# Patient Record
Sex: Female | Born: 1955
Health system: Southern US, Community
[De-identification: ages and names within clinical notes are randomized; demographics above are authoritative.]

## PROBLEM LIST (undated history)

## (undated) DIAGNOSIS — I2510 Atherosclerotic heart disease of native coronary artery without angina pectoris: Secondary | ICD-10-CM

## (undated) DIAGNOSIS — K7689 Other specified diseases of liver: Secondary | ICD-10-CM

## (undated) DIAGNOSIS — H539 Unspecified visual disturbance: Secondary | ICD-10-CM

## (undated) DIAGNOSIS — E039 Hypothyroidism, unspecified: Secondary | ICD-10-CM

## (undated) DIAGNOSIS — T7840XA Allergy, unspecified, initial encounter: Secondary | ICD-10-CM

## (undated) DIAGNOSIS — Z803 Family history of malignant neoplasm of breast: Secondary | ICD-10-CM

## (undated) DIAGNOSIS — B019 Varicella without complication: Secondary | ICD-10-CM

## (undated) DIAGNOSIS — E669 Obesity, unspecified: Secondary | ICD-10-CM

## (undated) DIAGNOSIS — E785 Hyperlipidemia, unspecified: Secondary | ICD-10-CM

## (undated) DIAGNOSIS — I10 Essential (primary) hypertension: Secondary | ICD-10-CM

## (undated) DIAGNOSIS — I219 Acute myocardial infarction, unspecified: Secondary | ICD-10-CM

## (undated) DIAGNOSIS — G4762 Sleep related leg cramps: Secondary | ICD-10-CM

## (undated) DIAGNOSIS — B059 Measles without complication: Secondary | ICD-10-CM

## (undated) DIAGNOSIS — E041 Nontoxic single thyroid nodule: Secondary | ICD-10-CM

## (undated) DIAGNOSIS — R011 Cardiac murmur, unspecified: Secondary | ICD-10-CM

## (undated) DIAGNOSIS — E559 Vitamin D deficiency, unspecified: Secondary | ICD-10-CM

## (undated) DIAGNOSIS — G43909 Migraine, unspecified, not intractable, without status migrainosus: Secondary | ICD-10-CM

## (undated) DIAGNOSIS — J42 Unspecified chronic bronchitis: Secondary | ICD-10-CM

## (undated) DIAGNOSIS — I639 Cerebral infarction, unspecified: Secondary | ICD-10-CM

## (undated) DIAGNOSIS — C44300 Unspecified malignant neoplasm of skin of unspecified part of face: Secondary | ICD-10-CM

## (undated) DIAGNOSIS — R7303 Prediabetes: Secondary | ICD-10-CM

## (undated) DIAGNOSIS — J309 Allergic rhinitis, unspecified: Secondary | ICD-10-CM

## (undated) HISTORY — PX: TONSILLECTOMY: SUR1361

## (undated) HISTORY — DX: Cardiac murmur, unspecified: R01.1

## (undated) HISTORY — DX: Measles without complication: B05.9

## (undated) HISTORY — DX: Allergy, unspecified, initial encounter: T78.40XA

## (undated) HISTORY — DX: Other specified diseases of liver: K76.89

## (undated) HISTORY — DX: Nontoxic single thyroid nodule: E04.1

## (undated) HISTORY — DX: Vitamin D deficiency, unspecified: E55.9

## (undated) HISTORY — DX: Family history of malignant neoplasm of breast: Z80.3

## (undated) HISTORY — DX: Cerebral infarction, unspecified: I63.9

## (undated) HISTORY — DX: Essential (primary) hypertension: I10

## (undated) HISTORY — DX: Hyperlipidemia, unspecified: E78.5

## (undated) HISTORY — DX: Obesity, unspecified: E66.9

## (undated) HISTORY — PX: CYSTOSTOMY W/ BLADDER DILATION: SHX1432

## (undated) HISTORY — PX: LAPAROSCOPIC CHOLECYSTECTOMY: SUR755

## (undated) HISTORY — DX: Unspecified malignant neoplasm of skin of unspecified part of face: C44.300

## (undated) HISTORY — PX: SQUAMOUS CELL CARCINOMA EXCISION: SHX2433

## (undated) HISTORY — DX: Varicella without complication: B01.9

## (undated) HISTORY — DX: Allergic rhinitis, unspecified: J30.9

## (undated) HISTORY — DX: Unspecified visual disturbance: H53.9

## (undated) HISTORY — DX: Sleep related leg cramps: G47.62

---

## 1996-10-04 DIAGNOSIS — C4491 Basal cell carcinoma of skin, unspecified: Secondary | ICD-10-CM

## 1996-10-04 HISTORY — DX: Basal cell carcinoma of skin, unspecified: C44.91

## 1997-10-28 ENCOUNTER — Encounter: Admission: RE | Admit: 1997-10-28 | Discharge: 1997-10-28 | Payer: Self-pay | Admitting: Family Medicine

## 1997-11-30 ENCOUNTER — Encounter: Admission: RE | Admit: 1997-11-30 | Discharge: 1997-11-30 | Payer: Self-pay | Admitting: Family Medicine

## 1998-11-17 ENCOUNTER — Other Ambulatory Visit: Admission: RE | Admit: 1998-11-17 | Discharge: 1998-11-17 | Payer: Self-pay | Admitting: Family Medicine

## 1998-11-17 ENCOUNTER — Encounter: Admission: RE | Admit: 1998-11-17 | Discharge: 1998-11-17 | Payer: Self-pay | Admitting: Family Medicine

## 1999-01-09 ENCOUNTER — Encounter: Admission: RE | Admit: 1999-01-09 | Discharge: 1999-01-09 | Payer: Self-pay | Admitting: Family Medicine

## 1999-01-23 ENCOUNTER — Encounter: Admission: RE | Admit: 1999-01-23 | Discharge: 1999-01-23 | Payer: Self-pay | Admitting: Family Medicine

## 1999-07-23 ENCOUNTER — Encounter: Admission: RE | Admit: 1999-07-23 | Discharge: 1999-07-23 | Payer: Self-pay | Admitting: Family Medicine

## 1999-11-02 ENCOUNTER — Encounter: Admission: RE | Admit: 1999-11-02 | Discharge: 1999-11-02 | Payer: Self-pay | Admitting: Family Medicine

## 1999-12-17 ENCOUNTER — Encounter: Admission: RE | Admit: 1999-12-17 | Discharge: 1999-12-17 | Payer: Self-pay | Admitting: Family Medicine

## 2000-10-31 ENCOUNTER — Encounter: Admission: RE | Admit: 2000-10-31 | Discharge: 2000-10-31 | Payer: Self-pay | Admitting: Family Medicine

## 2000-12-05 DIAGNOSIS — C4491 Basal cell carcinoma of skin, unspecified: Secondary | ICD-10-CM

## 2000-12-05 HISTORY — DX: Basal cell carcinoma of skin, unspecified: C44.91

## 2001-12-04 ENCOUNTER — Encounter: Admission: RE | Admit: 2001-12-04 | Discharge: 2001-12-04 | Payer: Self-pay | Admitting: Family Medicine

## 2001-12-16 ENCOUNTER — Encounter: Payer: Self-pay | Admitting: Sports Medicine

## 2001-12-16 ENCOUNTER — Encounter: Admission: RE | Admit: 2001-12-16 | Discharge: 2001-12-16 | Payer: Self-pay | Admitting: Sports Medicine

## 2002-02-10 ENCOUNTER — Ambulatory Visit (HOSPITAL_COMMUNITY): Admission: RE | Admit: 2002-02-10 | Discharge: 2002-02-10 | Payer: Self-pay | Admitting: Family Medicine

## 2002-02-10 ENCOUNTER — Encounter: Payer: Self-pay | Admitting: Family Medicine

## 2002-02-10 ENCOUNTER — Encounter: Admission: RE | Admit: 2002-02-10 | Discharge: 2002-02-10 | Payer: Self-pay | Admitting: Family Medicine

## 2002-02-25 ENCOUNTER — Encounter: Admission: RE | Admit: 2002-02-25 | Discharge: 2002-02-25 | Payer: Self-pay | Admitting: Family Medicine

## 2002-03-22 ENCOUNTER — Encounter: Admission: RE | Admit: 2002-03-22 | Discharge: 2002-03-22 | Payer: Self-pay | Admitting: Sports Medicine

## 2002-05-12 ENCOUNTER — Encounter: Admission: RE | Admit: 2002-05-12 | Discharge: 2002-05-12 | Payer: Self-pay | Admitting: Family Medicine

## 2002-12-22 ENCOUNTER — Encounter: Admission: RE | Admit: 2002-12-22 | Discharge: 2002-12-22 | Payer: Self-pay | Admitting: Family Medicine

## 2003-01-12 ENCOUNTER — Encounter: Admission: RE | Admit: 2003-01-12 | Discharge: 2003-01-12 | Payer: Self-pay | Admitting: Family Medicine

## 2003-01-14 ENCOUNTER — Encounter: Admission: RE | Admit: 2003-01-14 | Discharge: 2003-01-14 | Payer: Self-pay | Admitting: Family Medicine

## 2003-01-26 ENCOUNTER — Encounter: Payer: Self-pay | Admitting: Family Medicine

## 2003-01-26 ENCOUNTER — Encounter: Admission: RE | Admit: 2003-01-26 | Discharge: 2003-01-26 | Payer: Self-pay | Admitting: Family Medicine

## 2003-04-02 ENCOUNTER — Emergency Department (HOSPITAL_COMMUNITY): Admission: AD | Admit: 2003-04-02 | Discharge: 2003-04-02 | Payer: Self-pay | Admitting: Family Medicine

## 2003-04-11 ENCOUNTER — Emergency Department (HOSPITAL_COMMUNITY): Admission: AD | Admit: 2003-04-11 | Discharge: 2003-04-11 | Payer: Self-pay | Admitting: Family Medicine

## 2003-08-22 ENCOUNTER — Emergency Department (HOSPITAL_COMMUNITY): Admission: AD | Admit: 2003-08-22 | Discharge: 2003-08-22 | Payer: Self-pay | Admitting: Family Medicine

## 2003-11-02 ENCOUNTER — Emergency Department (HOSPITAL_COMMUNITY): Admission: EM | Admit: 2003-11-02 | Discharge: 2003-11-02 | Payer: Self-pay | Admitting: Family Medicine

## 2003-11-20 ENCOUNTER — Emergency Department (HOSPITAL_COMMUNITY): Admission: EM | Admit: 2003-11-20 | Discharge: 2003-11-20 | Payer: Self-pay | Admitting: Internal Medicine

## 2003-11-23 ENCOUNTER — Encounter (INDEPENDENT_AMBULATORY_CARE_PROVIDER_SITE_OTHER): Payer: Self-pay | Admitting: *Deleted

## 2003-11-23 LAB — CONVERTED CEMR LAB

## 2003-12-12 ENCOUNTER — Encounter: Admission: RE | Admit: 2003-12-12 | Discharge: 2003-12-12 | Payer: Self-pay | Admitting: Family Medicine

## 2004-05-24 ENCOUNTER — Emergency Department (HOSPITAL_COMMUNITY): Admission: EM | Admit: 2004-05-24 | Discharge: 2004-05-24 | Payer: Self-pay | Admitting: Family Medicine

## 2004-10-12 DIAGNOSIS — C4490 Unspecified malignant neoplasm of skin, unspecified: Secondary | ICD-10-CM

## 2004-10-12 HISTORY — DX: Unspecified malignant neoplasm of skin, unspecified: C44.90

## 2005-05-31 ENCOUNTER — Ambulatory Visit (HOSPITAL_COMMUNITY): Admission: RE | Admit: 2005-05-31 | Discharge: 2005-05-31 | Payer: Self-pay | Admitting: Family Medicine

## 2006-06-02 ENCOUNTER — Ambulatory Visit (HOSPITAL_COMMUNITY): Admission: RE | Admit: 2006-06-02 | Discharge: 2006-06-02 | Payer: Self-pay | Admitting: Family Medicine

## 2006-08-22 ENCOUNTER — Encounter (INDEPENDENT_AMBULATORY_CARE_PROVIDER_SITE_OTHER): Payer: Self-pay | Admitting: *Deleted

## 2007-02-16 DIAGNOSIS — C44300 Unspecified malignant neoplasm of skin of unspecified part of face: Secondary | ICD-10-CM

## 2007-02-16 HISTORY — DX: Unspecified malignant neoplasm of skin of unspecified part of face: C44.300

## 2007-06-25 HISTORY — PX: COLONOSCOPY: SHX174

## 2007-07-13 ENCOUNTER — Ambulatory Visit (HOSPITAL_COMMUNITY): Admission: RE | Admit: 2007-07-13 | Discharge: 2007-07-13 | Payer: Self-pay | Admitting: Family Medicine

## 2007-07-22 ENCOUNTER — Encounter: Admission: RE | Admit: 2007-07-22 | Discharge: 2007-07-22 | Payer: Self-pay | Admitting: Family Medicine

## 2008-04-11 ENCOUNTER — Ambulatory Visit: Payer: Self-pay | Admitting: Family Medicine

## 2008-08-03 ENCOUNTER — Encounter: Admission: RE | Admit: 2008-08-03 | Discharge: 2008-08-03 | Payer: Self-pay | Admitting: Family Medicine

## 2009-04-03 ENCOUNTER — Encounter: Admission: RE | Admit: 2009-04-03 | Discharge: 2009-04-03 | Payer: Self-pay | Admitting: Gastroenterology

## 2009-08-09 ENCOUNTER — Encounter: Admission: RE | Admit: 2009-08-09 | Discharge: 2009-08-09 | Payer: Self-pay | Admitting: Family Medicine

## 2010-04-11 ENCOUNTER — Encounter
Admission: RE | Admit: 2010-04-11 | Discharge: 2010-07-10 | Payer: Self-pay | Source: Home / Self Care | Attending: General Surgery | Admitting: General Surgery

## 2010-04-23 ENCOUNTER — Ambulatory Visit (HOSPITAL_COMMUNITY): Admission: RE | Admit: 2010-04-23 | Discharge: 2010-04-23 | Payer: Self-pay | Admitting: General Surgery

## 2010-05-14 ENCOUNTER — Ambulatory Visit: Payer: Self-pay | Admitting: Family Medicine

## 2010-07-03 ENCOUNTER — Ambulatory Visit (HOSPITAL_COMMUNITY)
Admission: RE | Admit: 2010-07-03 | Discharge: 2010-07-03 | Payer: Self-pay | Source: Home / Self Care | Attending: General Surgery | Admitting: General Surgery

## 2010-07-03 HISTORY — PX: LAPAROSCOPIC GASTRIC BANDING: SHX1100

## 2010-07-09 LAB — GLUCOSE, CAPILLARY
Glucose-Capillary: 153 mg/dL — ABNORMAL HIGH (ref 70–99)
Glucose-Capillary: 204 mg/dL — ABNORMAL HIGH (ref 70–99)

## 2010-07-14 ENCOUNTER — Other Ambulatory Visit: Payer: Self-pay | Admitting: Family Medicine

## 2010-07-14 DIAGNOSIS — Z1239 Encounter for other screening for malignant neoplasm of breast: Secondary | ICD-10-CM

## 2010-07-15 ENCOUNTER — Encounter: Payer: Self-pay | Admitting: Family Medicine

## 2010-07-17 ENCOUNTER — Encounter
Admission: RE | Admit: 2010-07-17 | Discharge: 2010-07-24 | Payer: Self-pay | Source: Home / Self Care | Attending: General Surgery | Admitting: General Surgery

## 2010-08-14 ENCOUNTER — Ambulatory Visit
Admission: RE | Admit: 2010-08-14 | Discharge: 2010-08-14 | Disposition: A | Discharge status: Home / Self Care | Payer: BC Managed Care – PPO | Source: Ambulatory Visit | Attending: Family Medicine | Admitting: Family Medicine

## 2010-08-14 DIAGNOSIS — Z1239 Encounter for other screening for malignant neoplasm of breast: Secondary | ICD-10-CM

## 2010-08-28 ENCOUNTER — Encounter: Payer: BC Managed Care – PPO | Attending: General Surgery | Admitting: *Deleted

## 2010-08-28 DIAGNOSIS — Z9884 Bariatric surgery status: Secondary | ICD-10-CM | POA: Insufficient documentation

## 2010-08-28 DIAGNOSIS — Z713 Dietary counseling and surveillance: Secondary | ICD-10-CM | POA: Insufficient documentation

## 2010-08-28 DIAGNOSIS — Z09 Encounter for follow-up examination after completed treatment for conditions other than malignant neoplasm: Secondary | ICD-10-CM | POA: Insufficient documentation

## 2010-09-03 LAB — COMPREHENSIVE METABOLIC PANEL
ALT: 86 U/L — ABNORMAL HIGH (ref 0–35)
AST: 48 U/L — ABNORMAL HIGH (ref 0–37)
Albumin: 4.3 g/dL (ref 3.5–5.2)
Alkaline Phosphatase: 65 U/L (ref 39–117)
BUN: 17 mg/dL (ref 6–23)
CO2: 25 mEq/L (ref 19–32)
Calcium: 10.3 mg/dL (ref 8.4–10.5)
Chloride: 102 mEq/L (ref 96–112)
Creatinine, Ser: 0.74 mg/dL (ref 0.4–1.2)
GFR calc Af Amer: >60 mL/min (ref >60)
GFR calc non Af Amer: >60 mL/min (ref >60)
Glucose, Bld: 122 mg/dL — ABNORMAL HIGH (ref 70–99)
Potassium: 4.2 mEq/L (ref 3.5–5.1)
Sodium: 138 mEq/L (ref 135–145)
Total Bilirubin: 0.5 mg/dL (ref 0.3–1.2)
Total Protein: 7.9 g/dL (ref 6.0–8.3)

## 2010-09-03 LAB — SURGICAL PCR SCREEN
MRSA, PCR: NEGATIVE
Staphylococcus aureus: POSITIVE — AB

## 2010-09-03 LAB — CBC
HCT: 37.5 % (ref 36.0–46.0)
Hemoglobin: 12.3 g/dL (ref 12.0–15.0)
MCH: 31.9 pg (ref 26.0–34.0)
MCHC: 32.8 g/dL (ref 30.0–36.0)
MCV: 97.4 fL (ref 78.0–100.0)
Platelets: 301 10*3/uL (ref 150–400)
RBC: 3.85 MIL/uL — ABNORMAL LOW (ref 3.87–5.11)
RDW: 12.6 % (ref 11.5–15.5)
WBC: 8.8 10*3/uL (ref 4.0–10.5)

## 2010-09-03 LAB — DIFFERENTIAL
Basophils Absolute: 0.1 10*3/uL (ref 0.0–0.1)
Basophils Relative: 1 % (ref 0–1)
Eosinophils Absolute: 0.1 10*3/uL (ref 0.0–0.7)
Eosinophils Relative: 1 % (ref 0–5)
Lymphocytes Relative: 28 % (ref 12–46)
Lymphs Abs: 2.5 10*3/uL (ref 0.7–4.0)
Monocytes Absolute: 0.5 10*3/uL (ref 0.1–1.0)
Monocytes Relative: 6 % (ref 3–12)
Neutro Abs: 5.8 10*3/uL (ref 1.7–7.7)
Neutrophils Relative %: 65 % (ref 43–77)

## 2010-10-19 ENCOUNTER — Encounter (INDEPENDENT_AMBULATORY_CARE_PROVIDER_SITE_OTHER): Payer: Self-pay | Admitting: General Surgery

## 2010-10-30 ENCOUNTER — Ambulatory Visit: Payer: BC Managed Care – PPO | Admitting: *Deleted

## 2010-11-07 ENCOUNTER — Encounter: Payer: BC Managed Care – PPO | Attending: General Surgery | Admitting: *Deleted

## 2010-11-07 DIAGNOSIS — Z9884 Bariatric surgery status: Secondary | ICD-10-CM | POA: Insufficient documentation

## 2010-11-07 DIAGNOSIS — Z713 Dietary counseling and surveillance: Secondary | ICD-10-CM | POA: Insufficient documentation

## 2010-11-07 DIAGNOSIS — Z09 Encounter for follow-up examination after completed treatment for conditions other than malignant neoplasm: Secondary | ICD-10-CM | POA: Insufficient documentation

## 2010-12-27 ENCOUNTER — Encounter (INDEPENDENT_AMBULATORY_CARE_PROVIDER_SITE_OTHER): Payer: Self-pay

## 2010-12-28 ENCOUNTER — Encounter (INDEPENDENT_AMBULATORY_CARE_PROVIDER_SITE_OTHER): Payer: BC Managed Care – PPO

## 2011-01-08 ENCOUNTER — Ambulatory Visit: Payer: BC Managed Care – PPO | Admitting: *Deleted

## 2011-01-25 ENCOUNTER — Encounter (INDEPENDENT_AMBULATORY_CARE_PROVIDER_SITE_OTHER): Payer: Self-pay

## 2011-01-25 ENCOUNTER — Ambulatory Visit (INDEPENDENT_AMBULATORY_CARE_PROVIDER_SITE_OTHER): Payer: BC Managed Care – PPO | Admitting: Physician Assistant

## 2011-01-25 VITALS — BP 114/72 | Ht 62.5 in | Wt 213.8 lb

## 2011-01-25 DIAGNOSIS — Z4651 Encounter for fitting and adjustment of gastric lap band: Secondary | ICD-10-CM

## 2011-01-25 NOTE — Progress Notes (Signed)
  HISTORY: Elaine Jackson is a 55 y.o.female who received an AP-Standard lap-band in January 2012 by Dr. Johna Sheriff. She has no new complaints. She denies persistent regurgitation symptoms but believes that her portion sizes are bigger than they should be.  VITAL SIGNS: Filed Vitals:   01/25/11 1405  BP: 114/72    PHYSICAL EXAM: Physical exam reveals a very well-appearing 55 y.o.female in no apparent distress Neurologic: Awake, alert, oriented Psych: Bright affect, conversant Respiratory: Breathing even and unlabored. No stridor or wheezing Abdomen: Soft, nontender, nondistended to palpation. Incisions well-healed. No incisional hernias. Port easily palpated. Extremities: Atraumatic, good range of motion.  ASSESMENT: 55 y.o.  female  s/p AP-Standard lap-band.   PLAN: The patient's port was accessed with a 20G Huber needle without difficulty. Clear fluid was aspirated and 0.5 mL saline was added to the port to give a total fill volume of 4.0 mL. The patient was able to swallow water without difficulty following the procedure and was instructed to take clear liquids for the next 24-48 hours and advance slowly as tolerated.

## 2011-01-25 NOTE — Patient Instructions (Signed)
Take clear liquids for the next 48 hours. Thin protein shakes are ok to start on Saturday evening. Call us if you have persistent vomiting or regurgitation, night cough or reflux symptoms. Return as scheduled or sooner if you notice no changes in hunger/portion sizes.   

## 2011-01-29 ENCOUNTER — Ambulatory Visit: Payer: BC Managed Care – PPO | Admitting: *Deleted

## 2011-02-13 ENCOUNTER — Encounter: Payer: Self-pay | Admitting: *Deleted

## 2011-02-13 ENCOUNTER — Encounter: Payer: BC Managed Care – PPO | Admitting: *Deleted

## 2011-02-13 DIAGNOSIS — Z9884 Bariatric surgery status: Secondary | ICD-10-CM | POA: Insufficient documentation

## 2011-02-13 NOTE — Progress Notes (Deleted)
  Follow-up visit: *** Weeks Post-Operative LAGB Surgery  Medical Nutrition Therapy:  Appt start time: 0800 end time:  0830.  Assessment:  Primary concerns today: post-operative bariatric surgery nutrition management.  Weight today: *** Weight change: *** Total weight lost: *** BMI: *** Weight goal: *** % Weight goal met: ***  24-hr recall:  B (*** AM): *** Snk (***  AM): ***   L (*** PM): *** Snk (*** PM): ***  D (*** PM): *** Snk (*** PM): ***  Fluid intake: *** Estimated total protein intake: ***  Medications: *** Supplementation: ***  CBG monitoring: *** Average CBG per patient: *** Last patient reported A1c: ***  Using straws: *** Drinking while eating:*** Hair loss: *** Carbonated beverages: *** N/V/D/C: *** Dumping syndrome: *** Last Lap-Band fill: ***  Recent physical activity:  ***  Progress Towards Goal(s):  In progress.   Nutritional Diagnosis:  [place]-3.3 Overweight/obesity As related to recent LAGB surgery.  As evidenced by pt with BMI >30, continuing to follow post-op dietary guidelines for weight loss.    Intervention:    Follow Phase 3B: High Protein + Non-Starchy Vegetables  Eat 3-6 small meals/snacks, every 3-5 hrs  Increase lean protein foods to meet 60-80g goal  Increase fluid intake to 64oz +  Add 15 grams of carbohydrate (fruit, whole grain, starchy vegetable) with meals  Avoid drinking 15 minutes before, during and 30 minutes after eating  Aim for >30 min of physical activity daily  Monitoring/Evaluation:  Dietary intake, exercise, lap band fills, and body weight. Follow up in *** months for *** month post-op visit.

## 2011-03-01 ENCOUNTER — Encounter (INDEPENDENT_AMBULATORY_CARE_PROVIDER_SITE_OTHER): Payer: BC Managed Care – PPO

## 2011-03-28 ENCOUNTER — Telehealth (INDEPENDENT_AMBULATORY_CARE_PROVIDER_SITE_OTHER): Payer: Self-pay

## 2011-03-28 NOTE — Telephone Encounter (Signed)
Called patient to reschedule appointment for Friday 03/28/11 since Mardelle Matte has cancelled his afternoon office. She will need to change her time to either the morning or reschedule for another day.

## 2011-03-29 ENCOUNTER — Ambulatory Visit (INDEPENDENT_AMBULATORY_CARE_PROVIDER_SITE_OTHER): Payer: BC Managed Care – PPO | Admitting: Physician Assistant

## 2011-03-29 ENCOUNTER — Encounter (INDEPENDENT_AMBULATORY_CARE_PROVIDER_SITE_OTHER): Payer: Self-pay | Admitting: Physician Assistant

## 2011-03-29 VITALS — BP 116/74 | HR 68 | Resp 14 | Ht 62.5 in | Wt 215.0 lb

## 2011-03-29 DIAGNOSIS — Z4651 Encounter for fitting and adjustment of gastric lap band: Secondary | ICD-10-CM

## 2011-03-29 NOTE — Progress Notes (Signed)
  HISTORY: Elaine Jackson is a 55 y.o.female who received an AP-Standard lap-band in January 2012 by Dr. Johna Sheriff. She comes in with no new complaints. She denies vomiting or regurgitation symptoms but says that she's tempted to have 'seconds' after meals and has eaten them without difficulty. She says her hunger is unpredictable. She believes her biggest problems right now are choice related including lack of exercise and food choices. She thinks an adjustment would help regardless, as she's gained weight over the past two visits.  VITAL SIGNS: Filed Vitals:   03/29/11 0832  BP: 116/74  Pulse: 68  Resp: 14    PHYSICAL EXAM: Physical exam reveals a very well-appearing 55 y.o.female in no apparent distress Neurologic: Awake, alert, oriented Psych: Bright affect, conversant Respiratory: Breathing even and unlabored. No stridor or wheezing Abdomen: Soft, nontender, nondistended to palpation. Incisions well-healed. No incisional hernias. Port easily palpated. Extremities: Atraumatic, good range of motion.  ASSESMENT: 55 y.o.  female  s/p AP-Standard lap-band.   PLAN: The patient's port was accessed with a 20G Huber needle without difficulty. Clear fluid was aspirated and 0.5 mL saline was added to the port to give a total predicted volume of 4.5 mL. The patient was able to swallow water without difficulty following the procedure and was instructed to take clear liquids for the next 24-48 hours and advance slowly as tolerated.

## 2011-03-29 NOTE — Patient Instructions (Signed)
Take clear liquids for the next 48 hours. Thin protein shakes are ok to start on Saturday evening. Call us if you have persistent vomiting or regurgitation, night cough or reflux symptoms. Return as scheduled or sooner if you notice no changes in hunger/portion sizes.   

## 2011-05-10 ENCOUNTER — Encounter (INDEPENDENT_AMBULATORY_CARE_PROVIDER_SITE_OTHER): Payer: BC Managed Care – PPO

## 2011-05-24 ENCOUNTER — Encounter (INDEPENDENT_AMBULATORY_CARE_PROVIDER_SITE_OTHER): Payer: BC Managed Care – PPO

## 2011-06-07 ENCOUNTER — Encounter (INDEPENDENT_AMBULATORY_CARE_PROVIDER_SITE_OTHER): Payer: BC Managed Care – PPO

## 2011-07-04 ENCOUNTER — Encounter (INDEPENDENT_AMBULATORY_CARE_PROVIDER_SITE_OTHER): Payer: Self-pay | Admitting: General Surgery

## 2011-07-05 ENCOUNTER — Ambulatory Visit (INDEPENDENT_AMBULATORY_CARE_PROVIDER_SITE_OTHER): Payer: BC Managed Care – PPO | Admitting: Physician Assistant

## 2011-07-05 ENCOUNTER — Encounter (INDEPENDENT_AMBULATORY_CARE_PROVIDER_SITE_OTHER): Payer: Self-pay

## 2011-07-05 DIAGNOSIS — Z9884 Bariatric surgery status: Secondary | ICD-10-CM

## 2011-07-05 NOTE — Progress Notes (Signed)
  HISTORY: Elaine Jackson is a 56 y.o.female who received an AP-Standard lap-band in January 2012 by Dr. Johna Sheriff. She comes in with no new complaints. She has managed to lose weight despite the recent holidays. She will resume exercise class next week.  VITAL SIGNS: Filed Vitals:   07/05/11 1346  BP: 124/70  Pulse: 68  Temp: 97.6 F (36.4 C)  Resp: 16    PHYSICAL EXAM: Physical exam reveals a very well-appearing 56 y.o.female in no apparent distress Neurologic: Awake, alert, oriented Psych: Bright affect, conversant Respiratory: Breathing even and unlabored. No stridor or wheezing Extremities: Atraumatic, good range of motion. Skin: Warm, Dry, no rashes Musculoskeletal: Normal gait, Joints normal  ASSESMENT: 56 y.o.  female  s/p AP-Standard lap-band.   PLAN: She appears to be in the green zone, so I did not offer an adjustment today. I encouraged her to continue to watch her food choices and portions as well as to work on exercise. We'll have her back in three months or sooner if needed. As this is her one year anniversary since surgery, we obtained a photo.

## 2011-07-05 NOTE — Patient Instructions (Signed)
Return in three months. Return sooner if you notice increased hunger or appetite, ability to eat more food at a sitting or going for seconds, or if you have difficulty swallowing.

## 2011-08-16 ENCOUNTER — Encounter (INDEPENDENT_AMBULATORY_CARE_PROVIDER_SITE_OTHER): Payer: BC Managed Care – PPO

## 2011-08-19 ENCOUNTER — Other Ambulatory Visit: Payer: Self-pay | Admitting: Family Medicine

## 2011-08-19 DIAGNOSIS — Z1231 Encounter for screening mammogram for malignant neoplasm of breast: Secondary | ICD-10-CM

## 2011-09-04 ENCOUNTER — Ambulatory Visit: Payer: BC Managed Care – PPO

## 2011-09-06 ENCOUNTER — Ambulatory Visit
Admission: RE | Admit: 2011-09-06 | Discharge: 2011-09-06 | Disposition: A | Discharge status: Home / Self Care | Payer: BC Managed Care – PPO | Source: Ambulatory Visit | Attending: Family Medicine | Admitting: Family Medicine

## 2011-09-06 DIAGNOSIS — Z1231 Encounter for screening mammogram for malignant neoplasm of breast: Secondary | ICD-10-CM

## 2011-09-19 ENCOUNTER — Encounter (INDEPENDENT_AMBULATORY_CARE_PROVIDER_SITE_OTHER): Payer: BC Managed Care – PPO

## 2011-10-10 ENCOUNTER — Encounter (INDEPENDENT_AMBULATORY_CARE_PROVIDER_SITE_OTHER): Payer: BC Managed Care – PPO

## 2011-11-07 ENCOUNTER — Encounter (INDEPENDENT_AMBULATORY_CARE_PROVIDER_SITE_OTHER): Payer: Self-pay

## 2011-11-07 ENCOUNTER — Ambulatory Visit (INDEPENDENT_AMBULATORY_CARE_PROVIDER_SITE_OTHER): Payer: BC Managed Care – PPO | Admitting: Physician Assistant

## 2011-11-07 VITALS — BP 136/88 | HR 64 | Temp 97.4°F | Resp 16 | Ht 62.5 in | Wt 209.6 lb

## 2011-11-07 DIAGNOSIS — Z4651 Encounter for fitting and adjustment of gastric lap band: Secondary | ICD-10-CM

## 2011-11-07 NOTE — Progress Notes (Signed)
  HISTORY: Elaine Jackson is a 56 y.o.female who received an AP-Standard  lap-band in January 2012. She comes in today with no new complaints. She would like to increase her weight loss. We discussed physical activity as one component. She thought a small adjustment would help her along.  VITAL SIGNS: Filed Vitals:   11/07/11 0915  BP: 136/88  Pulse: 64  Temp: 97.4 F (36.3 C)  Resp: 16    PHYSICAL EXAM: Physical exam reveals a very well-appearing 56 y.o.female in no apparent distress Neurologic: Awake, alert, oriented Psych: Bright affect, conversant Respiratory: Breathing even and unlabored. No stridor or wheezing Abdomen: Soft, nontender, nondistended to palpation. Incisions well-healed. No incisional hernias. Port easily palpated. Extremities: Atraumatic, good range of motion.  ASSESMENT: 57 y.o.  female  s/p AP-Standard lap-band.   PLAN: The patient's port was accessed with a 20G Huber needle without difficulty. Clear fluid was aspirated and 0.25 mL saline was added to the port. The patient was able to swallow water without difficulty following the procedure and was instructed to take clear liquids for the next 24-48 hours and advance slowly as tolerated.

## 2011-11-07 NOTE — Patient Instructions (Signed)
Take clear liquids tonight. Thin protein shakes are ok to start tomorrow morning. Slowly advance your diet thereafter. Call us if you have persistent vomiting or regurgitation, night cough or reflux symptoms. Return as scheduled or sooner if you notice no changes in hunger/portion sizes.  

## 2011-12-02 LAB — LIPID PANEL
Cholesterol: 191 mg/dL (ref 0–200)
HDL: 44 mg/dL (ref 35–70)
LDL Cholesterol: 103 mg/dL

## 2012-02-27 ENCOUNTER — Encounter: Payer: Self-pay | Admitting: *Deleted

## 2012-02-27 ENCOUNTER — Encounter: Payer: Self-pay | Admitting: Family Medicine

## 2012-03-02 ENCOUNTER — Encounter: Payer: Self-pay | Admitting: Family Medicine

## 2012-03-02 ENCOUNTER — Ambulatory Visit (INDEPENDENT_AMBULATORY_CARE_PROVIDER_SITE_OTHER): Payer: BC Managed Care – PPO | Admitting: Family Medicine

## 2012-03-02 VITALS — BP 116/70 | HR 61 | Temp 98.1°F | Resp 16 | Ht 62.0 in | Wt 208.6 lb

## 2012-03-02 DIAGNOSIS — Z23 Encounter for immunization: Secondary | ICD-10-CM

## 2012-03-02 DIAGNOSIS — E669 Obesity, unspecified: Secondary | ICD-10-CM

## 2012-03-02 DIAGNOSIS — I10 Essential (primary) hypertension: Secondary | ICD-10-CM

## 2012-03-02 DIAGNOSIS — E785 Hyperlipidemia, unspecified: Secondary | ICD-10-CM

## 2012-03-02 DIAGNOSIS — E119 Type 2 diabetes mellitus without complications: Secondary | ICD-10-CM

## 2012-03-02 DIAGNOSIS — Z9884 Bariatric surgery status: Secondary | ICD-10-CM

## 2012-03-02 DIAGNOSIS — E7800 Pure hypercholesterolemia, unspecified: Secondary | ICD-10-CM

## 2012-03-02 LAB — CBC WITH DIFFERENTIAL/PLATELET
Basophils Absolute: 0 10*3/uL (ref 0.0–0.1)
Eosinophils Absolute: 0.1 10*3/uL (ref 0.0–0.7)
Eosinophils Relative: 1 % (ref 0–5)
Lymphocytes Relative: 30 % (ref 12–46)
Lymphs Abs: 2.1 10*3/uL (ref 0.7–4.0)
MCH: 30.5 pg (ref 26.0–34.0)
Neutrophils Relative %: 64 % (ref 43–77)
Platelets: 315 10*3/uL (ref 150–400)
RBC: 3.87 MIL/uL (ref 3.87–5.11)
RDW: 13.2 % (ref 11.5–15.5)
WBC: 7.1 10*3/uL (ref 4.0–10.5)

## 2012-03-02 LAB — COMPREHENSIVE METABOLIC PANEL
ALT: 14 U/L (ref 0–35)
AST: 13 U/L (ref 0–37)
Alkaline Phosphatase: 54 U/L (ref 39–117)
BUN: 14 mg/dL (ref 6–23)
Calcium: 9.9 mg/dL (ref 8.4–10.5)
Chloride: 105 mEq/L (ref 96–112)
Creat: 0.72 mg/dL (ref 0.50–1.10)

## 2012-03-02 LAB — LIPID PANEL
Cholesterol: 179 mg/dL (ref 0–200)
HDL: 39 mg/dL — ABNORMAL LOW (ref >39)
LDL Cholesterol: 100 mg/dL — ABNORMAL HIGH (ref 0–99)
Total CHOL/HDL Ratio: 4.6 Ratio
Triglycerides: 199 mg/dL — ABNORMAL HIGH (ref <150)
VLDL: 40 mg/dL (ref 0–40)

## 2012-03-02 LAB — CK: Total CK: 112 U/L (ref 7–177)

## 2012-03-02 NOTE — Progress Notes (Signed)
[address]   [place], [place]  [address]   [phone]  Subjective:    Patient ID: Elaine Jackson, female    DOB: September 04, 1955, 56 y.o.   MRN: [id number]  HPIThis 56 y.o. female presents to establish care and for three month follow-up:  1.  DMII:  Three month follow-up; no changes to management made at last visit.  Sugars running 111.   Reports compliance with medication; good tolerance to medication; good symptom control.  2.  HTN:  Three month follow-up; no changes to management made at last visit.  Reports good tolerance to medication; good compliance with medication; good symptom control.  Does not check blood pressure at home.  Denies chest pain, palpitations, shortness of breath, or leg swelling.   3. Hyperlipidemia:  Three month follow-up; no changes to management made at last visit.  Reports good compliance to treatment; good tolerance to treatment; good symptom control.  Denies HA, dizziness, vision changes, focal weakness, paresthesias.  4.  Obesity:  Weighs every morning.  Last fill months ago.  Scheduled to return in 01/2012 but did not call.  Can only eat 1/2 sandwich at a time.  No GI upset or discomfort.  Starting weight 259.    5.  Flu vaccine.  Agreeable.      Review of Systems  Constitutional: Negative for chills, diaphoresis and appetite change.  Respiratory: Negative for cough, shortness of breath, wheezing and stridor.   Cardiovascular: Negative for chest pain, palpitations and leg swelling.  Gastrointestinal: Negative for nausea, vomiting, abdominal pain, diarrhea and constipation.  Skin: Negative for color change, pallor, rash and wound.  Neurological: Negative for dizziness, tremors, seizures, syncope, facial asymmetry, speech difficulty, weakness, light-headedness, numbness and headaches.    Past Medical History  Diagnosis Date  . Hypertension   . Diabetes mellitus   . Hyperlipidemia   . Mass of leg   . Family history of breast cancer in first degree  relative     sister age 27  . Sleep related leg cramps   . Malignant neoplasm skin of face 02/16/2007  . Allergic rhinitis, cause unspecified   . Obesity, unspecified   . Unspecified vitamin D deficiency   . Other chronic nonalcoholic liver disease   . Chicken pox   . Measles     Past Surgical History  Procedure Date  . Gallbladder surgery 1994  . Laparoscopic gastric banding 07/03/10    Dr. Adriana Mccallum; [facility]  . Cholecystectomy   . Tonsillectomy     Prior to Admission medications   Medication Sig Start Date End Date Taking? Authorizing Provider  Calcium Carbonate-Vit D-Min (CALCIUM 1200 PO) Take 1,200 mg by mouth daily.     Yes Historical Provider, MD  glucose blood test strip 1 each by Other route as needed. True Test   Yes Historical Provider, MD  losartan-hydrochlorothiazide (HYZAAR) 100-25 MG per tablet Daily. 06/09/11  Yes Historical Provider, MD  metformin (FORTAMET) 1000 MG (OSM) 24 hr tablet Take 1,000 mg by mouth daily with breakfast. Splits into 500mg  in AM, 500mg  in PM    Yes Historical Provider, MD  Montelukast Sodium (SINGULAIR PO) Take by mouth.    Yes Historical Provider, MD  Multiple Vitamin (MULTIVITAMIN) tablet Take 1 tablet by mouth daily.   Yes Historical Provider, MD  traZODone (DESYREL) 50 MG tablet Take 50 mg by mouth at bedtime.   Yes Historical Provider, MD  simvastatin (ZOCOR) 20 MG tablet Take 1 tablet (20 mg total) by mouth  1 day or 1 dose. 04/14/12   Nelva Nay, PA-C    Allergies  Allergen Reactions  . Sulfur Nausea Only and Other (See Comments)    ALSO VOMITING    History   Social History  . Marital Status: Married    Spouse Name: N/A    Number of Children: 1  . Years of Education: N/A   Occupational History  . [employer]    Social History Main Topics  . Smoking status: Current Every Day Smoker -- 1.0 packs/day for 25 years    Types: Cigarettes  . Smokeless tobacco: Never Used   Comment: quit age 4     smoked from 73 to  39 off and on  . Alcohol Use: No  . Drug Use: No  . Sexually Active: Not on file   Other Topics Concern  . Not on file   Social History Narrative   Married; x 22 years. Second marriage. Happily marries no abuse. Exercise: walking tape 2 x per week.    Family History  Problem Relation Age of Onset  . Diabetes Mother   . Heart disease Mother   . Heart disease Father   . Kidney disease Father   . Diabetes Father   . Hypertension Father   . Hyperlipidemia Father   . Stroke Father   . Diabetes Sister   . Hypertension Sister   . Kidney disease Sister   . Cancer Sister   . Diabetes Brother   . Hypertension Brother   . Cancer Brother   . Hyperlipidemia Brother         Objective:   Physical Exam  Constitutional: She is oriented to person, place, and time. She appears well-developed and well-nourished. No distress.  Eyes: Conjunctivae normal are normal. Pupils are equal, round, and reactive to light.  Neck: Normal range of motion. Neck supple. No thyromegaly present.  Cardiovascular: Normal rate, regular rhythm, normal heart sounds and intact distal pulses.  Exam reveals no gallop and no friction rub.   No murmur heard. Pulmonary/Chest: Effort normal and breath sounds normal. No respiratory distress. She has no wheezes. She has no rales.  Abdominal: Soft. Bowel sounds are normal. She exhibits no distension. There is no tenderness. There is no rebound and no guarding.  Lymphadenopathy:    She has no cervical adenopathy.  Neurological: She is alert and oriented to person, place, and time. No cranial nerve deficit. She exhibits normal muscle tone.  Skin: Skin is warm and dry. No rash noted. She is not diaphoretic. No erythema.  Psychiatric: She has a normal mood and affect. Her behavior is normal. Judgment and thought content normal.   INFLUENZA VACCINE ADMINISTERED.     Assessment & Plan:

## 2012-03-02 NOTE — Patient Instructions (Addendum)
1. Type II or unspecified type diabetes mellitus without mention of complication, not stated as uncontrolled  Hemoglobin A1c  2. Essential hypertension, benign  CBC with Differential, CK, Comprehensive metabolic panel  3. Other and unspecified hyperlipidemia  Lipid panel  4. Flu vaccine need  Flu vaccine greater than or equal to 57yo preservative free IM

## 2012-04-14 ENCOUNTER — Other Ambulatory Visit: Payer: Self-pay

## 2012-04-14 MED ORDER — SIMVASTATIN 20 MG PO TABS: 20.0000 mg | ORAL_TABLET | ORAL | Status: DC

## 2012-04-16 DIAGNOSIS — IMO0001 Reserved for inherently not codable concepts without codable children: Secondary | ICD-10-CM | POA: Insufficient documentation

## 2012-04-16 DIAGNOSIS — Z23 Encounter for immunization: Secondary | ICD-10-CM | POA: Insufficient documentation

## 2012-04-16 DIAGNOSIS — E785 Hyperlipidemia, unspecified: Secondary | ICD-10-CM | POA: Insufficient documentation

## 2012-04-16 DIAGNOSIS — E119 Type 2 diabetes mellitus without complications: Secondary | ICD-10-CM | POA: Insufficient documentation

## 2012-04-16 DIAGNOSIS — I10 Essential (primary) hypertension: Secondary | ICD-10-CM | POA: Insufficient documentation

## 2012-04-16 NOTE — Assessment & Plan Note (Signed)
Controlled; obtain labs; continue current medication. 

## 2012-04-16 NOTE — Assessment & Plan Note (Signed)
Slowly improving

## 2012-04-16 NOTE — Assessment & Plan Note (Signed)
Controlled; no change in management; obtain labs. 

## 2012-04-16 NOTE — Assessment & Plan Note (Signed)
Administered in office today. 

## 2012-04-16 NOTE — Assessment & Plan Note (Signed)
Improving; continues to slowly lose weight.

## 2012-04-18 NOTE — Progress Notes (Signed)
Reviewed and agree.

## 2012-04-25 ENCOUNTER — Other Ambulatory Visit: Payer: Self-pay | Admitting: Family Medicine

## 2012-06-08 ENCOUNTER — Encounter: Payer: Self-pay | Admitting: Family Medicine

## 2012-06-08 ENCOUNTER — Ambulatory Visit (INDEPENDENT_AMBULATORY_CARE_PROVIDER_SITE_OTHER): Payer: BC Managed Care – PPO | Admitting: Family Medicine

## 2012-06-08 VITALS — BP 110/69 | HR 53 | Temp 97.6°F | Resp 16 | Ht 62.0 in | Wt 207.0 lb

## 2012-06-08 DIAGNOSIS — Z9884 Bariatric surgery status: Secondary | ICD-10-CM

## 2012-06-08 DIAGNOSIS — F4320 Adjustment disorder, unspecified: Secondary | ICD-10-CM

## 2012-06-08 DIAGNOSIS — E785 Hyperlipidemia, unspecified: Secondary | ICD-10-CM

## 2012-06-08 DIAGNOSIS — F4321 Adjustment disorder with depressed mood: Secondary | ICD-10-CM

## 2012-06-08 DIAGNOSIS — E119 Type 2 diabetes mellitus without complications: Secondary | ICD-10-CM

## 2012-06-08 DIAGNOSIS — I10 Essential (primary) hypertension: Secondary | ICD-10-CM

## 2012-06-08 DIAGNOSIS — D649 Anemia, unspecified: Secondary | ICD-10-CM | POA: Insufficient documentation

## 2012-06-08 DIAGNOSIS — E7800 Pure hypercholesterolemia, unspecified: Secondary | ICD-10-CM

## 2012-06-08 LAB — CBC WITH DIFFERENTIAL/PLATELET
HCT: 35.3 % — ABNORMAL LOW (ref 36.0–46.0)
Hemoglobin: 12.3 g/dL (ref 12.0–15.0)
Lymphocytes Relative: 28 % (ref 12–46)
Monocytes Absolute: 0.4 10*3/uL (ref 0.1–1.0)
Monocytes Relative: 6 % (ref 3–12)
Neutro Abs: 4.3 10*3/uL (ref 1.7–7.7)
Neutrophils Relative %: 63 % (ref 43–77)
RBC: 3.94 MIL/uL (ref 3.87–5.11)
WBC: 6.7 10*3/uL (ref 4.0–10.5)

## 2012-06-08 LAB — COMPREHENSIVE METABOLIC PANEL
ALT: 13 U/L (ref 0–35)
Albumin: 4.3 g/dL (ref 3.5–5.2)
Alkaline Phosphatase: 62 U/L (ref 39–117)
CO2: 26 mEq/L (ref 19–32)
Glucose, Bld: 112 mg/dL — ABNORMAL HIGH (ref 70–99)
Potassium: 4.2 mEq/L (ref 3.5–5.3)
Sodium: 139 mEq/L (ref 135–145)
Total Protein: 6.6 g/dL (ref 6.0–8.3)

## 2012-06-08 LAB — LIPID PANEL
LDL Cholesterol: 77 mg/dL (ref 0–99)
Triglycerides: 111 mg/dL (ref <150)
VLDL: 22 mg/dL (ref 0–40)

## 2012-06-08 LAB — HEMOGLOBIN A1C: Mean Plasma Glucose: 123 mg/dL — ABNORMAL HIGH (ref <117)

## 2012-06-08 LAB — CK: Total CK: 151 U/L (ref 7–177)

## 2012-06-08 LAB — IFOBT (OCCULT BLOOD): IFOBT: NEGATIVE

## 2012-06-08 NOTE — Assessment & Plan Note (Signed)
Controlled; obtain labs; continue current medication. 

## 2012-06-08 NOTE — Assessment & Plan Note (Signed)
New.  Hemosure obtained at visit today; repeat labs.  Asymptomatic. Colonoscopy UTD.

## 2012-06-08 NOTE — Assessment & Plan Note (Signed)
Controlled; no change in management; obtain labs. Encourage compliance with Metformin.

## 2012-06-08 NOTE — Patient Instructions (Addendum)
1. Type II or unspecified type diabetes mellitus without mention of complication, not stated as uncontrolled  Comprehensive metabolic panel, Hemoglobin A1c  2. Essential hypertension, benign  CBC with Differential, CK, Comprehensive metabolic panel  3. Pure hypercholesterolemia  Lipid panel  4. Anemia, unspecified  IFOBT POC (occult bld, rslt in office)

## 2012-06-08 NOTE — Assessment & Plan Note (Signed)
New. Counseling provided during visit; coping well at this time.

## 2012-06-08 NOTE — Assessment & Plan Note (Signed)
Improving; weight down 1 pound in past three months despite holidays.

## 2012-06-08 NOTE — Progress Notes (Signed)
[address]   [place], [place]  [address]   [phone]  Subjective:    Patient ID: Elaine Jackson, female    DOB: 1956-06-20, 56 y.o.   MRN: [id number]  HPIThis 56 y.o. female presents for three month follow-up:  1. Anemia:  Hgb of 11.8 at last visit in 02/2012. Did not follow-up in three weeks for repeat H/H and hemosure.  No bloody stools or black stools.  No GERD.  No n/v/d/c.  Feels well.    2.  HTN:  Not checking at home; three month followup; no changes to management made at last visit.  Reports good compliance with medication; good symptom control; good tolerance to treatment.  Denies chest pain, palpitations, shortness of breath, leg swelling.  3.  DMII:  Three month follow-up; Checking sugars daily; up a little bit.  Must eat before taking Metformin .  Misses Metformin a lot.  132. Weight down one pound from last visit.  Denies polyuria, polydipsia.    4.  Obesity:  Weight down one pound in past three months.  Decreased appetite lately.  No fill since 10/2011.    5.  Hyperlipidemia:  Three month follow-up; no changes to management made at last visit; reports good compliance with treatment, good tolerance to treatment, good symptom control. Denies HA/dizziness/focal weakness/paresthesias.  6. Grief Reaction: sister died week before Halloween of liver cancer with metastasis.  Coping well with loss.  Last three weeks very painful and long for sister.     Review of Systems  Constitutional: Positive for appetite change. Negative for fever, chills, diaphoresis, activity change and fatigue.  Respiratory: Negative for shortness of breath.   Cardiovascular: Negative for chest pain, palpitations and leg swelling.  Gastrointestinal: Negative for nausea, vomiting, abdominal pain, diarrhea, constipation, blood in stool, abdominal distention, anal bleeding and rectal pain.  Skin: Negative for wound.  Neurological: Negative for dizziness, syncope, facial asymmetry, speech difficulty, weakness,  light-headedness, numbness and headaches.  Psychiatric/Behavioral: Negative for suicidal ideas, sleep disturbance, self-injury and dysphoric mood. The patient is not nervous/anxious.         Past Medical History  Diagnosis Date  . Hypertension   . Diabetes mellitus   . Hyperlipidemia   . Mass of leg   . Family history of breast cancer in first degree relative     sister age 18  . Sleep related leg cramps   . Malignant neoplasm skin of face 02/16/2007  . Allergic rhinitis, cause unspecified   . Obesity, unspecified   . Unspecified vitamin D deficiency   . Other chronic nonalcoholic liver disease   . Chicken pox   . Measles     Past Surgical History  Procedure Date  . Gallbladder surgery 1994  . Laparoscopic gastric banding 07/03/10    Dr. Adriana Mccallum; [facility]  . Cholecystectomy   . Tonsillectomy     Prior to Admission medications   Medication Sig Start Date End Date Taking? Authorizing Provider  glucose blood test strip 1 each by Other route as needed. True Test   Yes Historical Provider, MD  losartan-hydrochlorothiazide (HYZAAR) 100-25 MG per tablet Daily. 06/09/11  Yes Historical Provider, MD  metformin (FORTAMET) 1000 MG (OSM) 24 hr tablet Take 1,000 mg by mouth daily with breakfast. Splits into 500mg  in AM, 500mg  in PM    Yes Historical Provider, MD  montelukast (SINGULAIR) 10 MG tablet TAKE 1 TABLET BY MOUTH DAILY FOR ALLERGIES 04/25/12  Yes Chelle S Jeffery, PA-C  simvastatin (ZOCOR) 20  MG tablet Take 1 tablet (20 mg total) by mouth 1 day or 1 dose. 04/14/12  Yes Heather M Marte, PA-C  Calcium Carbonate-Vit D-Min (CALCIUM 1200 PO) Take 1,200 mg by mouth daily.      Historical Provider, MD  Multiple Vitamin (MULTIVITAMIN) tablet Take 1 tablet by mouth daily.    Historical Provider, MD  traZODone (DESYREL) 50 MG tablet Take 50 mg by mouth at bedtime.    Historical Provider, MD    Allergies  Allergen Reactions  . Sulfur Nausea Only and Other (See Comments)    ALSO  VOMITING    History   Social History  . Marital Status: Married    Spouse Name: N/A    Number of Children: 1  . Years of Education: N/A   Occupational History  . [employer]    Social History Main Topics  . Smoking status: Former Smoker -- 1.0 packs/day for 25 years    Types: Cigarettes  . Smokeless tobacco: Never Used     Comment: quit age 56     smoked from 37 to 39 off and on  . Alcohol Use: No  . Drug Use: No  . Sexually Active: Not on file   Other Topics Concern  . Not on file   Social History Narrative   Married; x 22 years. Second marriage. Happily marries no abuse. Exercise: walking tape 2 x per week.    Family History  Problem Relation Age of Onset  . Diabetes Mother   . Heart disease Mother   . Heart disease Father   . Kidney disease Father   . Diabetes Father   . Hypertension Father   . Hyperlipidemia Father   . Stroke Father   . Diabetes Sister   . Hypertension Sister   . Kidney disease Sister   . Cancer Sister   . Diabetes Brother   . Hypertension Brother   . Cancer Brother   . Hyperlipidemia Brother     Objective:   Physical Exam  Nursing note and vitals reviewed. Constitutional: She is oriented to person, place, and time. She appears well-developed and well-nourished. No distress.  Eyes: Conjunctivae normal and EOM are normal. Pupils are equal, round, and reactive to light.  Neck: Normal range of motion. Neck supple. No JVD present. No thyromegaly present.  Cardiovascular: Normal rate, regular rhythm, normal heart sounds and intact distal pulses.  Exam reveals no gallop and no friction rub.   No murmur heard. Pulmonary/Chest: Effort normal. She has no wheezes. She has no rales.  Abdominal: Soft. Bowel sounds are normal. She exhibits no distension and no mass. There is no tenderness. There is no rebound and no guarding.  Lymphadenopathy:    She has no cervical adenopathy.  Neurological: She is alert and oriented to person, place, and time.  No cranial nerve deficit. She exhibits normal muscle tone. Coordination normal.  Skin: She is not diaphoretic.  Psychiatric: She has a normal mood and affect. Her behavior is normal. Judgment and thought content normal.       Assessment & Plan:

## 2012-06-14 ENCOUNTER — Ambulatory Visit (INDEPENDENT_AMBULATORY_CARE_PROVIDER_SITE_OTHER): Payer: BC Managed Care – PPO | Admitting: Emergency Medicine

## 2012-06-14 VITALS — BP 108/63 | HR 58 | Temp 97.4°F | Resp 18 | Ht 62.25 in | Wt 209.0 lb

## 2012-06-14 DIAGNOSIS — J40 Bronchitis, not specified as acute or chronic: Secondary | ICD-10-CM

## 2012-06-14 DIAGNOSIS — J029 Acute pharyngitis, unspecified: Secondary | ICD-10-CM

## 2012-06-14 LAB — POCT RAPID STREP A (OFFICE): Rapid Strep A Screen: NEGATIVE

## 2012-06-14 MED ORDER — FLUTICASONE PROPIONATE 50 MCG/ACT NA SUSP: 2.0000 | Freq: Every day | NASAL | Status: DC

## 2012-06-14 MED ORDER — HYDROCODONE-HOMATROPINE 5-1.5 MG/5ML PO SYRP: 5.0000 mL | ORAL_SOLUTION | Freq: Three times a day (TID) | ORAL | Status: DC | PRN

## 2012-06-14 MED ORDER — AZITHROMYCIN 250 MG PO TABS: ORAL_TABLET | ORAL | Status: DC

## 2012-06-14 NOTE — Progress Notes (Signed)
  Subjective:    Patient ID: Elaine Jackson, female    DOB: 06-Mar-1956, 56 y.o.   MRN: [id number]  HPI  Patient is here with cough, ear pain, congestion yellowish mucus, sore throat producing mucus that looks like blood is in it.  Has been using a netty pot.  When she has done this in the past, it turns into bronchitis.  Former smoker quit 17 years ago.      Review of Systems     Objective:   Physical Exam HEENT exam reveals TMs to be dull. Nasal turbinates are congested. The posterior pharynx is clear. Neck is supple chest is clear to both auscultation and percussion. Cardiac exam reveals a regular rate and rhythm  Results for orders placed in visit on 06/14/12  POCT RAPID STREP A (OFFICE)      Component Value Range   Rapid Strep A Screen Negative  Negative        Assessment & Plan:  We'll treat as an upper respiratory infection with secondary bronchitis. She'll be treated with a Z-Pak, Hycodan at night for cough. Flonase for the nasal congestion

## 2012-08-18 ENCOUNTER — Other Ambulatory Visit: Payer: Self-pay | Admitting: Family Medicine

## 2012-08-18 DIAGNOSIS — Z1231 Encounter for screening mammogram for malignant neoplasm of breast: Secondary | ICD-10-CM

## 2012-09-08 ENCOUNTER — Other Ambulatory Visit: Payer: Self-pay | Admitting: Family Medicine

## 2012-09-14 ENCOUNTER — Ambulatory Visit (INDEPENDENT_AMBULATORY_CARE_PROVIDER_SITE_OTHER): Payer: BC Managed Care – PPO | Admitting: Family Medicine

## 2012-09-14 ENCOUNTER — Encounter: Payer: Self-pay | Admitting: Family Medicine

## 2012-09-14 VITALS — BP 112/70 | HR 57 | Temp 97.6°F | Resp 16 | Ht 62.0 in | Wt 204.8 lb

## 2012-09-14 DIAGNOSIS — L603 Nail dystrophy: Secondary | ICD-10-CM | POA: Insufficient documentation

## 2012-09-14 DIAGNOSIS — Z0000 Encounter for general adult medical examination without abnormal findings: Secondary | ICD-10-CM

## 2012-09-14 DIAGNOSIS — I10 Essential (primary) hypertension: Secondary | ICD-10-CM | POA: Insufficient documentation

## 2012-09-14 DIAGNOSIS — Z01419 Encounter for gynecological examination (general) (routine) without abnormal findings: Secondary | ICD-10-CM

## 2012-09-14 DIAGNOSIS — E785 Hyperlipidemia, unspecified: Secondary | ICD-10-CM

## 2012-09-14 DIAGNOSIS — E119 Type 2 diabetes mellitus without complications: Secondary | ICD-10-CM

## 2012-09-14 LAB — LIPID PANEL
HDL: 49 mg/dL (ref >39)
Total CHOL/HDL Ratio: 3.5 Ratio
VLDL: 34 mg/dL (ref 0–40)

## 2012-09-14 LAB — POCT URINALYSIS DIPSTICK
Bilirubin, UA: NEGATIVE
Blood, UA: NEGATIVE
Nitrite, UA: NEGATIVE
Protein, UA: NEGATIVE
Urobilinogen, UA: 0.2
pH, UA: 5

## 2012-09-14 LAB — CBC WITH DIFFERENTIAL/PLATELET
Basophils Relative: 1 % (ref 0–1)
HCT: 38.5 % (ref 36.0–46.0)
Hemoglobin: 13.2 g/dL (ref 12.0–15.0)
Lymphocytes Relative: 29 % (ref 12–46)
Lymphs Abs: 2 10*3/uL (ref 0.7–4.0)
MCHC: 34.3 g/dL (ref 30.0–36.0)
Monocytes Absolute: 0.4 10*3/uL (ref 0.1–1.0)
Monocytes Relative: 6 % (ref 3–12)
Neutro Abs: 4.2 10*3/uL (ref 1.7–7.7)
Neutrophils Relative %: 63 % (ref 43–77)
RBC: 4.3 MIL/uL (ref 3.87–5.11)

## 2012-09-14 LAB — IFOBT (OCCULT BLOOD): IFOBT: NEGATIVE

## 2012-09-14 LAB — POCT UA - MICROSCOPIC ONLY
Casts, Ur, LPF, POC: NEGATIVE
Crystals, Ur, HPF, POC: NEGATIVE
Yeast, UA: NEGATIVE

## 2012-09-14 LAB — HEMOGLOBIN A1C: Mean Plasma Glucose: 128 mg/dL — ABNORMAL HIGH (ref <117)

## 2012-09-14 LAB — FOLATE: Folate: 15.7 ng/mL

## 2012-09-14 LAB — COMPREHENSIVE METABOLIC PANEL
Albumin: 4.8 g/dL (ref 3.5–5.2)
Alkaline Phosphatase: 62 U/L (ref 39–117)
BUN: 23 mg/dL (ref 6–23)
Calcium: 10.1 mg/dL (ref 8.4–10.5)
Creat: 0.75 mg/dL (ref 0.50–1.10)
Glucose, Bld: 118 mg/dL — ABNORMAL HIGH (ref 70–99)
Potassium: 4.1 mEq/L (ref 3.5–5.3)

## 2012-09-14 LAB — CK: Total CK: 170 U/L (ref 7–177)

## 2012-09-14 LAB — VITAMIN B12: Vitamin B-12: 299 pg/mL (ref 211–911)

## 2012-09-14 MED ORDER — LOSARTAN POTASSIUM-HCTZ 100-25 MG PO TABS: 1.0000 | ORAL_TABLET | Freq: Every day | ORAL | Status: DC

## 2012-09-14 MED ORDER — METFORMIN HCL 500 MG PO TABS: 500.0000 mg | ORAL_TABLET | Freq: Two times a day (BID) | ORAL | Status: DC

## 2012-09-14 MED ORDER — SIMVASTATIN 20 MG PO TABS: 20.0000 mg | ORAL_TABLET | Freq: Every day | ORAL | Status: DC

## 2012-09-14 MED ORDER — TERBINAFINE HCL 250 MG PO TABS: 250.0000 mg | ORAL_TABLET | Freq: Every day | ORAL | Status: DC

## 2012-09-14 MED ORDER — MONTELUKAST SODIUM 10 MG PO TABS: 10.0000 mg | ORAL_TABLET | Freq: Every day | ORAL | Status: DC

## 2012-09-14 NOTE — Assessment & Plan Note (Signed)
New. Treat with Lamisil 250mg  one daily; RTC six weeks for LFTs.  Will warrant three months of therapy.

## 2012-09-14 NOTE — Progress Notes (Deleted)
  Subjective:    Patient ID: Elaine Jackson, female    DOB: 1955-12-02, 57 y.o.   MRN: [id number]  HPI    Review of Systems  Constitutional: Negative.   HENT: Negative.   Eyes: Negative.   Respiratory: Negative.   Cardiovascular: Negative.   Gastrointestinal: Negative.   Endocrine: Negative.   Genitourinary: Negative.   Musculoskeletal: Negative.   Skin: Negative.   Allergic/Immunologic: Negative.   Neurological: Negative.   Hematological: Negative.   Psychiatric/Behavioral: Negative.        Objective:   Physical Exam        Assessment & Plan:

## 2012-09-14 NOTE — Assessment & Plan Note (Signed)
Controlled; obtain labs including urine microalbumin.
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
## 2012-09-14 NOTE — Assessment & Plan Note (Signed)
Completed.  Pap smear obtained; mammogram scheduled tomorrow.

## 2012-09-14 NOTE — Assessment & Plan Note (Signed)
Anticipatory guidance --- further weight loss, exercise. Pap smear obtained; mammogram scheduled for tomorrow. Colonoscopy UTD; hemosure obtained due to recent anemia.  Immunizations UTD.  Obtain labs.

## 2012-09-15 ENCOUNTER — Ambulatory Visit
Admission: RE | Admit: 2012-09-15 | Discharge: 2012-09-15 | Disposition: A | Discharge status: Home / Self Care | Payer: BC Managed Care – PPO | Source: Ambulatory Visit | Attending: Family Medicine | Admitting: Family Medicine

## 2012-09-15 DIAGNOSIS — Z1231 Encounter for screening mammogram for malignant neoplasm of breast: Secondary | ICD-10-CM

## 2012-09-16 LAB — PAP IG AND HPV HIGH-RISK: HPV DNA High Risk: NOT DETECTED

## 2012-11-02 ENCOUNTER — Encounter: Payer: Self-pay | Admitting: Family Medicine

## 2012-11-02 ENCOUNTER — Ambulatory Visit (INDEPENDENT_AMBULATORY_CARE_PROVIDER_SITE_OTHER): Payer: BC Managed Care – PPO | Admitting: Family Medicine

## 2012-11-02 DIAGNOSIS — L603 Nail dystrophy: Secondary | ICD-10-CM

## 2012-11-02 DIAGNOSIS — L608 Other nail disorders: Secondary | ICD-10-CM

## 2012-11-02 LAB — COMPREHENSIVE METABOLIC PANEL
ALT: 16 U/L (ref 0–35)
AST: 16 U/L (ref 0–37)
Alkaline Phosphatase: 60 U/L (ref 39–117)
BUN: 16 mg/dL (ref 6–23)
Calcium: 9.6 mg/dL (ref 8.4–10.5)
Creat: 1.04 mg/dL (ref 0.50–1.10)
Total Bilirubin: 0.5 mg/dL (ref 0.3–1.2)

## 2012-11-02 NOTE — Progress Notes (Signed)
   [address]   [place], [place]  [address]   [phone]  Subjective:    Patient ID: Elaine Jackson, female    DOB: 02-21-1956, 57 y.o.   MRN: [id number]  HPI This 57 y.o. female presents for lab visit only. Started on Lamisil six weeks ago; presenting for labs/LFTs.     Review of Systems     Objective:   Physical Exam        Assessment & Plan:  Dystrophic nail - Plan: Comprehensive metabolic panel

## 2012-11-27 ENCOUNTER — Other Ambulatory Visit: Payer: Self-pay | Admitting: Physician Assistant

## 2012-12-21 ENCOUNTER — Ambulatory Visit: Payer: BC Managed Care – PPO | Admitting: Family Medicine

## 2013-01-20 ENCOUNTER — Encounter: Payer: Self-pay | Admitting: Family Medicine

## 2013-01-20 ENCOUNTER — Ambulatory Visit (INDEPENDENT_AMBULATORY_CARE_PROVIDER_SITE_OTHER): Payer: BC Managed Care – PPO | Admitting: Family Medicine

## 2013-01-20 VITALS — BP 107/71 | HR 61 | Temp 97.1°F | Resp 16 | Ht 62.5 in | Wt 204.0 lb

## 2013-01-20 DIAGNOSIS — L603 Nail dystrophy: Secondary | ICD-10-CM

## 2013-01-20 DIAGNOSIS — E559 Vitamin D deficiency, unspecified: Secondary | ICD-10-CM

## 2013-01-20 DIAGNOSIS — I10 Essential (primary) hypertension: Secondary | ICD-10-CM

## 2013-01-20 DIAGNOSIS — E119 Type 2 diabetes mellitus without complications: Secondary | ICD-10-CM

## 2013-01-20 DIAGNOSIS — G4700 Insomnia, unspecified: Secondary | ICD-10-CM

## 2013-01-20 DIAGNOSIS — E785 Hyperlipidemia, unspecified: Secondary | ICD-10-CM

## 2013-01-20 LAB — CBC WITH DIFFERENTIAL/PLATELET
Basophils Relative: 0 % (ref 0–1)
Eosinophils Relative: 2 % (ref 0–5)
HCT: 35.8 % — ABNORMAL LOW (ref 36.0–46.0)
Hemoglobin: 11.9 g/dL — ABNORMAL LOW (ref 12.0–15.0)
MCH: 30.5 pg (ref 26.0–34.0)
MCHC: 33.2 g/dL (ref 30.0–36.0)
MCV: 91.8 fL (ref 78.0–100.0)
Monocytes Absolute: 0.4 10*3/uL (ref 0.1–1.0)
Monocytes Relative: 6 % (ref 3–12)
Neutro Abs: 4.8 10*3/uL (ref 1.7–7.7)

## 2013-01-20 LAB — COMPREHENSIVE METABOLIC PANEL
Albumin: 4.6 g/dL (ref 3.5–5.2)
Alkaline Phosphatase: 50 U/L (ref 39–117)
BUN: 15 mg/dL (ref 6–23)
Calcium: 9.8 mg/dL (ref 8.4–10.5)
Creat: 0.82 mg/dL (ref 0.50–1.10)
Glucose, Bld: 85 mg/dL (ref 70–99)
Potassium: 4 mEq/L (ref 3.5–5.3)

## 2013-01-20 LAB — POCT GLYCOSYLATED HEMOGLOBIN (HGB A1C): Hemoglobin A1C: 5.6

## 2013-01-20 MED ORDER — TRAZODONE HCL 50 MG PO TABS: 50.0000 mg | ORAL_TABLET | Freq: Every evening | ORAL | Status: DC | PRN

## 2013-01-20 NOTE — Progress Notes (Signed)
[address]   [place], [place]  [address]   [phone]  Subjective:    Patient ID: Elaine Jackson, female    DOB: 1956-03-13, 57 y.o.   MRN: [id number]  HPI This 57 y.o. female presents for four month follow-up for the following:    1.  DMII; four month follow-up; sugars running 106-132. Taking Metformin once daily.  Compliance with medications.  Good tolerance to medication; good symptom control.    2.  HTN: four month follow-up; no changes to management at last visit; reports compliance with medication, good tolerance to medication, good symptom control.  Does not check BP at home.  No dizziness.    3. Hyperlipidemia: compliance with medication; good tolerance with medication, good symptom control.    4. Vitamin D deficiency: low vitamin D level at last visit; increased Vitamin D to 2000 units daily.    5. Dystrophic nail: Completed Lamisil x 12 weeks.  New growth of toenail looks healthy.  6. Health maintenance: pap smear normal; mammogram normal.  7.  Insomnia: wakes up at 5:30 and mind races.  No motivation.  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  Excessive worry.  Daughter causing major stressors.  Daughter moving in with pt.  Husband's drinking really bothers patient; finding his alcohol intake very unattractive.     Review of Systems  Constitutional: Negative for chills, diaphoresis, appetite change and fatigue.  Respiratory: Negative for shortness of breath, wheezing and stridor.   Cardiovascular: Negative for chest pain, palpitations and leg swelling.  Endocrine: Negative for cold intolerance, heat intolerance, polydipsia, polyphagia and polyuria.  Skin: Negative for color change, pallor, rash and wound.  Neurological: Negative for dizziness, tremors, seizures, syncope, facial asymmetry, speech difficulty, weakness, light-headedness, numbness and headaches.  Psychiatric/Behavioral: Positive for sleep disturbance. Negative for suicidal ideas, self-injury and dysphoric mood. The patient is  nervous/anxious.    Past Medical History  Diagnosis Date  . Hypertension   . Diabetes mellitus   . Hyperlipidemia   . Mass of leg   . Family history of breast cancer in first degree relative     sister age 35  . Sleep related leg cramps   . Allergic rhinitis, cause unspecified   . Obesity, unspecified   . Unspecified vitamin D deficiency   . Other chronic nonalcoholic liver disease   . Chicken pox   . Measles   . Malignant neoplasm skin of face 02/16/2007    Basal Cell Carcinoma  [facility].   Past Surgical History  Procedure Laterality Date  . Gallbladder surgery  1994  . Laparoscopic gastric banding  07/03/10    Dr. Adriana Mccallum; [facility]  . Cholecystectomy    . Tonsillectomy    . Colonoscopy  06/25/2007    Hung. Normal. Repeat in ten years.   Current Outpatient Prescriptions on File Prior to Visit  Medication Sig Dispense Refill  . fluticasone (FLONASE) 50 MCG/ACT nasal spray Place 2 sprays into the nose daily.  16 g  6  . glucose blood test strip 1 each by Other route as needed. True Test      . losartan-hydrochlorothiazide (HYZAAR) 100-25 MG per tablet Take 1 tablet by mouth daily.  90 tablet  3  . metFORMIN (GLUCOPHAGE) 500 MG tablet Take 1 tablet (500 mg total) by mouth 2 (two) times daily with a meal.  180 tablet  3  . montelukast (SINGULAIR) 10 MG tablet Take 1 tablet (10 mg total) by mouth at bedtime.  90 tablet  3  .  simvastatin (ZOCOR) 20 MG tablet Take 1 tablet (20 mg total) by mouth at bedtime.  90 tablet  3  . terbinafine (LAMISIL) 250 MG tablet Take 1 tablet (250 mg total) by mouth daily.  30 tablet  2   No current facility-administered medications on file prior to visit.       Objective:   Physical Exam  Nursing note and vitals reviewed. Constitutional: She is oriented to person, place, and time. She appears well-developed and well-nourished. No distress.  HENT:  Head: Normocephalic and atraumatic.  Eyes: Conjunctivae and EOM are normal. Pupils are equal,  round, and reactive to light.  Neck: Normal range of motion. Neck supple. No thyromegaly present.  Cardiovascular: Normal rate, regular rhythm and normal heart sounds.   Pulmonary/Chest: Effort normal and breath sounds normal.  Lymphadenopathy:    She has no cervical adenopathy.  Neurological: She is alert and oriented to person, place, and time. No cranial nerve deficit. She exhibits normal muscle tone. Coordination normal.  Skin: Skin is warm and dry. No rash noted. She is not diaphoretic. No erythema. No pallor.  Psychiatric: She has a normal mood and affect. Her behavior is normal.       Assessment & Plan:  Essential hypertension, benign - Plan: CBC with Differential, Comprehensive metabolic panel, CK  Type II or unspecified type diabetes mellitus without mention of complication, not stated as uncontrolled - Plan: POCT glycosylated hemoglobin (Hb A1C)  Unspecified vitamin D deficiency - Plan: Vitamin D 25 hydroxy  Insomnia - Plan: traZODone (DESYREL) 50 MG tablet  Nail dystrophy  Other and unspecified hyperlipidemia  Unspecified essential hypertension   1.  HTN: controlled; obtain labs; continue current medications. 2.  DMII:  Controlled; obtain labs; continue current medications. 3.  Hyperlipidemia:  Controlled; continue current medications. 4.  Vitamin D deficiency: uncontrolled; has increased Vitamin D supplement to 2000 units daily. 5.  Dystrophic nails: improving; s/p 12 weeks of Lamisil.  Obtain LFTs. 6.  Insomnia:  New.  Associated with anxiety. Treat insomnia with Trazodone.  If excessive worry persists at next visit, will add Prozac.    Meds ordered this encounter  Medications  . cholecalciferol (VITAMIN D) 1000 UNITS tablet    Sig: Take 2,000 Units by mouth daily.  . traZODone (DESYREL) 50 MG tablet    Sig: Take 1-2 tablets (50-100 mg total) by mouth at bedtime as needed for sleep.    Dispense:  60 tablet    Refill:  5

## 2013-01-21 LAB — VITAMIN D 25 HYDROXY (VIT D DEFICIENCY, FRACTURES): Vit D, 25-Hydroxy: 39 ng/mL (ref 30–89)

## 2013-01-26 ENCOUNTER — Telehealth: Payer: Self-pay

## 2013-01-26 NOTE — Telephone Encounter (Signed)
See labs 

## 2013-01-26 NOTE — Telephone Encounter (Signed)
Pt is calling back to speak to lab about her lab results Call back number is [phone]

## 2013-04-04 ENCOUNTER — Other Ambulatory Visit: Payer: Self-pay | Admitting: Family Medicine

## 2013-04-28 ENCOUNTER — Ambulatory Visit (INDEPENDENT_AMBULATORY_CARE_PROVIDER_SITE_OTHER): Payer: BC Managed Care – PPO | Admitting: Family Medicine

## 2013-04-28 ENCOUNTER — Encounter: Payer: Self-pay | Admitting: Family Medicine

## 2013-04-28 VITALS — BP 116/64 | HR 63 | Temp 97.8°F | Resp 16 | Ht 62.5 in | Wt 204.6 lb

## 2013-04-28 DIAGNOSIS — E119 Type 2 diabetes mellitus without complications: Secondary | ICD-10-CM

## 2013-04-28 DIAGNOSIS — E785 Hyperlipidemia, unspecified: Secondary | ICD-10-CM

## 2013-04-28 DIAGNOSIS — G4700 Insomnia, unspecified: Secondary | ICD-10-CM

## 2013-04-28 DIAGNOSIS — E7800 Pure hypercholesterolemia, unspecified: Secondary | ICD-10-CM

## 2013-04-28 DIAGNOSIS — Z9884 Bariatric surgery status: Secondary | ICD-10-CM

## 2013-04-28 DIAGNOSIS — D649 Anemia, unspecified: Secondary | ICD-10-CM

## 2013-04-28 DIAGNOSIS — I10 Essential (primary) hypertension: Secondary | ICD-10-CM

## 2013-04-28 DIAGNOSIS — Z23 Encounter for immunization: Secondary | ICD-10-CM

## 2013-04-28 LAB — COMPREHENSIVE METABOLIC PANEL
ALT: 15 U/L (ref 0–35)
AST: 17 U/L (ref 0–37)
Albumin: 4.5 g/dL (ref 3.5–5.2)
Alkaline Phosphatase: 57 U/L (ref 39–117)
BUN: 15 mg/dL (ref 6–23)
Calcium: 9.9 mg/dL (ref 8.4–10.5)
Chloride: 100 mEq/L (ref 96–112)
Potassium: 4.4 mEq/L (ref 3.5–5.3)
Sodium: 139 mEq/L (ref 135–145)
Total Protein: 7.1 g/dL (ref 6.0–8.3)

## 2013-04-28 LAB — CBC
MCV: 89.1 fL (ref 78.0–100.0)
Platelets: 343 10*3/uL (ref 150–400)
RBC: 4.03 MIL/uL (ref 3.87–5.11)
RDW: 13.2 % (ref 11.5–15.5)
WBC: 7.5 10*3/uL (ref 4.0–10.5)

## 2013-04-28 NOTE — Progress Notes (Signed)
[address]   [place], [place]  [address]   [phone]  Subjective:    Patient ID: Elaine Jackson, female    DOB: Jun 04, 1956, 57 y.o.   MRN: [id number]  HPI This 57 y.o. female presents for three month follow-up:  1. DMII:  Three month follow-up; no changes to management made at last visit; reports compliance with Metformin, good tolerance to Metformin; good symptom control.  Fasting sugar this morning 127 after large amount of popcorn yesterday.  Denies polyuria, polydipsia, polyphagia.  2. HTN:  Three month follow-up; no changes with management made at last visit; reports good compliance with medication, good tolerance to medication; good symptom control. Denies CP/palp/SOB/leg swelling.  3.  Hyperlipidemia: six month follow-up; no changes to management made at last visit; reports good tolerance to medication, good compliance to medication; good symptom control.  Denies HA, dizziness, focal weakness, paresthesias.  4.  Insomnia: persistent; never tried Trazodone therapy.  Mood has improved; continued stressors but worrying less.  Helping out with grandsons a lot.  5. Anemia: recurrent at last visit; feeling well.  Denies abdominal pain, nausea, vomiting, diarrhea, constipation.  Denies bloody stools or melena.  Started daily iron supplement since last visit.  Has more energy and feels well.   Review of Systems  Constitutional: Negative for fever, chills, diaphoresis and fatigue.  Respiratory: Negative for shortness of breath, wheezing and stridor.   Cardiovascular: Negative for chest pain, palpitations and leg swelling.  Gastrointestinal: Negative for nausea, vomiting, abdominal pain, diarrhea, constipation and blood in stool.  Endocrine: Negative for cold intolerance, heat intolerance, polydipsia, polyphagia and polyuria.  Skin: Negative for rash and wound.  Neurological: Negative for dizziness, syncope, weakness, light-headedness, numbness and headaches.  Psychiatric/Behavioral:  Positive for sleep disturbance. Negative for dysphoric mood. The patient is not nervous/anxious.    Past Medical History  Diagnosis Date  . Hypertension   . Diabetes mellitus   . Hyperlipidemia   . Mass of leg   . Family history of breast cancer in first degree relative     sister age 48  . Sleep related leg cramps   . Allergic rhinitis, cause unspecified   . Obesity, unspecified   . Unspecified vitamin D deficiency   . Other chronic nonalcoholic liver disease   . Chicken pox   . Measles   . Malignant neoplasm skin of face 02/16/2007    Basal Cell Carcinoma  [facility].   Past Surgical History  Procedure Laterality Date  . Gallbladder surgery  1994  . Laparoscopic gastric banding  07/03/10    Dr. Adriana Mccallum; [facility]  . Cholecystectomy    . Tonsillectomy    . Colonoscopy  06/25/2007    Hung. Normal. Repeat in ten years.   Allergies  Allergen Reactions  . Sulfur Nausea Only and Other (See Comments)    ALSO VOMITING   Current Outpatient Prescriptions on File Prior to Visit  Medication Sig Dispense Refill  . cholecalciferol (VITAMIN D) 1000 UNITS tablet Take 2,000 Units by mouth daily.      . fluticasone (FLONASE) 50 MCG/ACT nasal spray Place 2 sprays into the nose daily.  16 g  6  . glucose blood test strip 1 each by Other route as needed. True Test      . losartan-hydrochlorothiazide (HYZAAR) 100-25 MG per tablet TAKE 1 TABLET BY MOUTH EVERY MORNING FOR BLOOD PRESSURE  90 tablet  3  . metFORMIN (GLUCOPHAGE) 500 MG tablet Take 1 tablet (500 mg total) by mouth  2 (two) times daily with a meal.  180 tablet  3  . montelukast (SINGULAIR) 10 MG tablet Take 1 tablet (10 mg total) by mouth at bedtime.  90 tablet  3  . simvastatin (ZOCOR) 20 MG tablet Take 1 tablet (20 mg total) by mouth at bedtime.  90 tablet  3  . traZODone (DESYREL) 50 MG tablet Take 1-2 tablets (50-100 mg total) by mouth at bedtime as needed for sleep.  60 tablet  5   No current facility-administered medications on  file prior to visit.   History   Social History  . Marital Status: Married    Spouse Name: N/A    Number of Children: 1  . Years of Education: N/A   Occupational History  . HAIR STYLIST    Social History Main Topics  . Smoking status: Former Smoker -- 1.00 packs/day for 25 years    Types: Cigarettes  . Smokeless tobacco: Never Used     Comment: quit age 85     smoked from 32 to 39 off and on  . Alcohol Use: No  . Drug Use: No  . Sexual Activity: Yes   Other Topics Concern  . Not on file   Social History Narrative   Marital status:  Married; x 23 years. Second marriage. Happily married; no abuse      Children: one child, one stepson;two grandsons      Lives: with husband, stepson..      Employment:  Hairdresser full-time.      Tobacco:  None      Alcohol: rare/minimal.      Drugs:  None       Exercise: walking tape 2 x per week; sporadic.       Seatbelt:  100%.       Sunscreen: SPF 15.      Guns:  Maybe?   Family History  Problem Relation Age of Onset  . Diabetes Mother   . Heart disease Mother     CHF with defibrillator; carotid artery stenosis.  . Hypertension Mother   . Hyperlipidemia Mother   . Heart disease Father   . Kidney disease Father   . Diabetes Father   . Hypertension Father   . Hyperlipidemia Father   . Stroke Father   . Diabetes Sister   . Hypertension Sister   . Kidney disease Sister   . Diabetes Brother   . Hypertension Brother   . Hyperlipidemia Brother   . Arthritis Brother   . Cancer Sister     cervical cancer  . Cancer Sister 5    Liver cancer from hepatitis C.       Objective:   Physical Exam  Nursing note and vitals reviewed. Constitutional: She is oriented to person, place, and time. She appears well-developed and well-nourished. No distress.  HENT:  Head: Normocephalic and atraumatic.  Eyes: Conjunctivae and EOM are normal. Pupils are equal, round, and reactive to light.  Neck: Normal range of motion. Neck supple. No  thyromegaly present.  Cardiovascular: Normal rate, regular rhythm and normal heart sounds.  Exam reveals no gallop and no friction rub.   No murmur heard. Pulmonary/Chest: Effort normal and breath sounds normal. She has no wheezes. She has no rales.  Abdominal: Soft. Bowel sounds are normal. She exhibits no distension and no mass. There is no tenderness. There is no rebound and no guarding.  Lymphadenopathy:    She has no cervical adenopathy.  Neurological: She is alert and oriented to  person, place, and time. No cranial nerve deficit. She exhibits normal muscle tone. Coordination normal.  Skin: Skin is warm and dry. No rash noted. She is not diaphoretic.  Thickened skin B feet.  Psychiatric: She has a normal mood and affect. Her behavior is normal. Judgment and thought content normal.   INFLUENZA VACCINE ADMINISTERED.    Assessment & Plan:  Type II or unspecified type diabetes mellitus without mention of complication, not stated as uncontrolled - Plan: HM Diabetes Foot Exam  Anemia, unspecified  Essential hypertension, benign  Flu vaccine need  Hx of laparoscopic gastric banding  Insomnia  Other and unspecified hyperlipidemia  1.  DMII: controlled; obtain labs; continue current medications. 2.  Anemia: recurrent; tolerating iron supplement daily; repeat today. Asymptomatic. 3.  HTN: controlled; obtain labs; continue current medications. 4.  Obesity s/p gastric lap banding:  Weight stable at 204.  Encourage regular exercise. 5.  Insomnia: persistent without worsening; has not taken Trazodone. 6.  Hyperlipidemia: controlled; obtain labs; continue current medications. 7.  S/p flu vaccine.  Nilda Simmer, M.D.  [facility] [address] [place], [place]  [address] [phone] phone [phone] fax

## 2013-05-05 ENCOUNTER — Telehealth: Payer: Self-pay

## 2013-05-05 NOTE — Telephone Encounter (Signed)
See labs, they have left message for him to call back. I called left another message.

## 2013-05-05 NOTE — Telephone Encounter (Signed)
PT STATES SHE HAD LABS DONE OVER A WEEK AGO AND STILL HASN'T HEARD THE RESULTS. DO NOT WANT TO GO INTO MYCHART PLEASE CALL [phone]

## 2013-05-06 ENCOUNTER — Telehealth: Payer: Self-pay

## 2013-05-06 NOTE — Telephone Encounter (Signed)
Spoke with Karmen at [facility] to have patients lipase doctor billed.  We ordered a lipase and it should of been a lipid.  Dr. Katrinka Blazing informed

## 2013-06-02 ENCOUNTER — Other Ambulatory Visit: Payer: Self-pay | Admitting: Family Medicine

## 2013-06-30 ENCOUNTER — Other Ambulatory Visit: Payer: Self-pay | Admitting: Family Medicine

## 2013-08-02 ENCOUNTER — Encounter: Payer: Self-pay | Admitting: Family Medicine

## 2013-08-02 ENCOUNTER — Ambulatory Visit (INDEPENDENT_AMBULATORY_CARE_PROVIDER_SITE_OTHER): Payer: 59 | Admitting: Family Medicine

## 2013-08-02 VITALS — BP 119/72 | HR 73 | Temp 98.3°F | Resp 16 | Ht 62.0 in | Wt 201.0 lb

## 2013-08-02 DIAGNOSIS — Z9884 Bariatric surgery status: Secondary | ICD-10-CM

## 2013-08-02 DIAGNOSIS — I10 Essential (primary) hypertension: Secondary | ICD-10-CM

## 2013-08-02 DIAGNOSIS — E119 Type 2 diabetes mellitus without complications: Secondary | ICD-10-CM

## 2013-08-02 DIAGNOSIS — E7800 Pure hypercholesterolemia, unspecified: Secondary | ICD-10-CM

## 2013-08-02 LAB — COMPLETE METABOLIC PANEL WITH GFR
ALK PHOS: 57 U/L (ref 39–117)
ALT: 20 U/L (ref 0–35)
AST: 16 U/L (ref 0–37)
Albumin: 5.2 g/dL (ref 3.5–5.2)
BUN: 23 mg/dL (ref 6–23)
CO2: 29 mEq/L (ref 19–32)
CREATININE: 0.87 mg/dL (ref 0.50–1.10)
Calcium: 10.4 mg/dL (ref 8.4–10.5)
Chloride: 99 mEq/L (ref 96–112)
GFR, EST NON AFRICAN AMERICAN: 74 mL/min
GFR, Est African American: 85 mL/min
Glucose, Bld: 105 mg/dL — ABNORMAL HIGH (ref 70–99)
Potassium: 4.4 mEq/L (ref 3.5–5.3)
Sodium: 139 mEq/L (ref 135–145)
Total Bilirubin: 0.7 mg/dL (ref 0.2–1.2)
Total Protein: 7.6 g/dL (ref 6.0–8.3)

## 2013-08-02 LAB — CBC WITH DIFFERENTIAL/PLATELET
BASOS ABS: 0 10*3/uL (ref 0.0–0.1)
Basophils Relative: 1 % (ref 0–1)
EOS ABS: 0.1 10*3/uL (ref 0.0–0.7)
EOS PCT: 1 % (ref 0–5)
HCT: 39.7 % (ref 36.0–46.0)
Hemoglobin: 13.7 g/dL (ref 12.0–15.0)
Lymphocytes Relative: 28 % (ref 12–46)
Lymphs Abs: 2.2 10*3/uL (ref 0.7–4.0)
MCH: 30.7 pg (ref 26.0–34.0)
MCHC: 34.5 g/dL (ref 30.0–36.0)
MCV: 89 fL (ref 78.0–100.0)
MONO ABS: 0.5 10*3/uL (ref 0.1–1.0)
Monocytes Relative: 7 % (ref 3–12)
NEUTROS ABS: 5 10*3/uL (ref 1.7–7.7)
Neutrophils Relative %: 63 % (ref 43–77)
Platelets: 362 10*3/uL (ref 150–400)
RBC: 4.46 MIL/uL (ref 3.87–5.11)
RDW: 13.3 % (ref 11.5–15.5)
WBC: 7.8 10*3/uL (ref 4.0–10.5)

## 2013-08-02 LAB — LIPID PANEL
Cholesterol: 162 mg/dL (ref 0–200)
HDL: 60 mg/dL (ref >39)
LDL CALC: 74 mg/dL (ref 0–99)
TRIGLYCERIDES: 138 mg/dL (ref <150)
Total CHOL/HDL Ratio: 2.7 Ratio
VLDL: 28 mg/dL (ref 0–40)

## 2013-08-02 LAB — HEMOGLOBIN A1C
HEMOGLOBIN A1C: 5.9 % — AB (ref <5.7)
MEAN PLASMA GLUCOSE: 123 mg/dL — AB (ref <117)

## 2013-08-02 NOTE — Progress Notes (Signed)
Subjective:    Patient ID: Elaine Jackson, female    DOB: [birth year], 58 y.o.   MRN: [id number] This chart was scribed for Elaine Honour, MD by Rolanda Lundborg, ED Scribe. This patient was seen in room 23 and the patient's care was started at 11:50 AM.  Chief Complaint  Patient presents with  . 3 month check up  . Hypertension  . Diabetes  . Hyperlipidemia    HPI HPI Comments: Elaine Jackson is a 58 y.o. female with a h/o DM, HTN, hypercholesterolemia, and lap band surgery who presents to the [facility] for a 3 month check up. She states she wakes up at 6:30AM every morning and would like to be able to sleep later. The last time she saw Dr Sherrin Daisy for lap band follow-up was 2 years ago because she is doing very well post lap band surgery. Her sugar was 120 this morning, which is where it usually runs. She states her goal is to get under 200 pounds. She reports she is doing well except she cannot give up her bowl of chocolate ice cream every night. She is taking Iron and multivitamin daily. Pt does not check her BP at home but reports compliance with medication.  Also reports compliance with Simvastatin for hypercholesterolemia.    Cayleb has just finished his testing for ADHD so his school is going to start letting him test in separate quiet rooms. He is seeing a psychiatrist who put him on the patch.  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 She is getting her certification and planning to work at [employer] in [employer].   PCP Reginia Forts, MD   Past Medical History  Diagnosis Date  . Hypertension   . Diabetes mellitus   . Hyperlipidemia   . Family history of breast cancer in first degree relative     sister age 40  . Sleep related leg cramps   . Allergic rhinitis, cause unspecified   . Obesity, unspecified   . Unspecified vitamin D deficiency   . Other chronic nonalcoholic liver disease   . Chicken pox   . Measles   .  Malignant neoplasm skin of face 02/16/2007    Basal Cell Carcinoma  [facility].   Past Surgical History  Procedure Laterality Date  . Gallbladder surgery  1994  . Laparoscopic gastric banding  07/03/10    Dr. Sherrin Daisy; [facility]  . Cholecystectomy    . Tonsillectomy    . Colonoscopy  06/25/2007    Hung. Normal. Repeat in ten years.    Current Outpatient Prescriptions on File Prior to Visit  Medication Sig Dispense Refill  . BAYER CONTOUR NEXT TEST test strip CHECK SUGAR ONCE A DAY  100 each  3  . cholecalciferol (VITAMIN D) 1000 UNITS tablet Take 2,000 Units by mouth daily.      . fluticasone (FLONASE) 50 MCG/ACT nasal spray Place 2 sprays into the nose daily.  16 g  6  . Iron TABS Take by mouth daily.      Marland Kitchen losartan-hydrochlorothiazide (HYZAAR) 100-25 MG per tablet TAKE 1 TABLET BY MOUTH EVERY MORNING FOR BLOOD PRESSURE  90 tablet  3  . losartan-hydrochlorothiazide (HYZAAR) 100-25 MG per tablet TAKE 1 TABLET BY MOUTH EVERY MORNING FOR BLOOD PRESSURE  90 tablet  0  . metFORMIN (GLUCOPHAGE) 500 MG tablet Take 1 tablet (500 mg total) by mouth 2 (two) times daily with a meal.  180 tablet  3  . montelukast (SINGULAIR) 10 MG tablet Take 1 tablet (10 mg total) by mouth at bedtime.  90 tablet  3  . simvastatin (ZOCOR) 20 MG tablet Take 1 tablet (20 mg total) by mouth at bedtime.  90 tablet  3  . traZODone (DESYREL) 50 MG tablet Take 1-2 tablets (50-100 mg total) by mouth at bedtime as needed for sleep.  60 tablet  5   No current facility-administered medications on file prior to visit.   Allergies  Allergen Reactions  . Sulfur Nausea Only and Other (See Comments)    ALSO VOMITING   History   Social History  . Marital Status: Married    Spouse Name: N/A    Number of Children: 1  . Years of Education: N/A   Occupational History  . HAIR STYLIST    Social History Main Topics  . Smoking status: Former Smoker -- 1.00 packs/day for 25 years    Types: Cigarettes  . Smokeless tobacco:  Never Used     Comment: quit age 73     smoked from 8 to 26 off and on  . Alcohol Use: No  . Drug Use: No  . Sexual Activity: Yes   Other Topics Concern  . Not on file   Social History Narrative   Marital status:  Married; x 23 years. Second marriage. Happily married; no abuse      Children: one child, one stepson;two grandsons      Lives: with husband, stepson..      Employment:  Hairdresser full-time.      Tobacco:  None      Alcohol: rare/minimal.      Drugs:  None       Exercise: walking tape 2 x per week; sporadic.       Seatbelt:  100%.       Sunscreen: SPF 15.      Guns:  Maybe?    Review of Systems  Constitutional: Negative for fever, chills, diaphoresis, activity change, appetite change and fatigue.  Respiratory: Negative for cough, shortness of breath and stridor.   Cardiovascular: Negative for chest pain, palpitations and leg swelling.  Gastrointestinal: Negative for abdominal pain, diarrhea, constipation and blood in stool.  Endocrine: Negative for cold intolerance, heat intolerance, polydipsia, polyphagia and polyuria.  Skin: Negative for rash.  Neurological: Negative for dizziness, syncope, weakness, light-headedness, numbness and headaches.  Psychiatric/Behavioral: Positive for sleep disturbance. Negative for dysphoric mood. The patient is not nervous/anxious.        Objective:   Physical Exam  Nursing note and vitals reviewed. Constitutional: She is oriented to person, place, and time. She appears well-developed and well-nourished. No distress.  HENT:  Head: Normocephalic and atraumatic.  Eyes: Conjunctivae and EOM are normal. Pupils are equal, round, and reactive to light.  Neck: Normal range of motion. Neck supple. Carotid bruit is not present. No tracheal deviation present.  Cardiovascular: Normal rate, regular rhythm and intact distal pulses.   Murmur (2/6 cystolic) heard.  Systolic murmur is present with a grade of 2/6  Pulmonary/Chest: Effort  normal. No respiratory distress.  Abdominal: Soft. Bowel sounds are normal. She exhibits no distension and no mass. There is no tenderness. There is no rebound and no guarding.  Musculoskeletal: Normal range of motion.  Lymphadenopathy:    She has no cervical adenopathy.  Neurological: She is alert and oriented to person, place, and time. No cranial nerve deficit.  Skin: Skin is warm and dry. No rash  noted. She is not diaphoretic.  Psychiatric: She has a normal mood and affect. Her behavior is normal. Judgment and thought content normal.     Filed Vitals:   08/02/13 1147  BP: 119/72  Pulse: 73  Temp: 98.3 F (36.8 C)  TempSrc: Oral  Resp: 16  Height: 5\' 2"  (1.575 m)  Weight: 201 lb (91.173 kg)  SpO2: 98%        Assessment & Plan:   1. Essential hypertension, benign : controlled; obtain labs; no change in therapy.  2. Type II or unspecified type diabetes mellitus without mention of complication, not stated as uncontrolled : controlled; obtain labs; no change in therapy.  3. Pure hypercholesterolemia : controlled; obtain labs; no change in therapy.  4. Hx of laparoscopic gastric banding : with slow continued weight loss.  Encourage exercise and discontinuation of nightly ice cream intake.   No orders of the defined types were placed in this encounter.    I personally performed the services described in this documentation, which was scribed in my presence.  The recorded information has been reviewed and is accurate.   Reginia Forts, M.D.  [facility] [address] [place], [place]  [address] [phone] phone [phone] fax

## 2013-08-13 ENCOUNTER — Other Ambulatory Visit: Payer: Self-pay

## 2013-08-13 DIAGNOSIS — Z1231 Encounter for screening mammogram for malignant neoplasm of breast: Secondary | ICD-10-CM

## 2013-09-16 ENCOUNTER — Other Ambulatory Visit: Payer: Self-pay | Admitting: Family Medicine

## 2013-09-21 ENCOUNTER — Ambulatory Visit
Admission: RE | Admit: 2013-09-21 | Discharge: 2013-09-21 | Disposition: A | Discharge status: Home / Self Care | Payer: No Typology Code available for payment source | Source: Ambulatory Visit

## 2013-09-21 DIAGNOSIS — Z1231 Encounter for screening mammogram for malignant neoplasm of breast: Secondary | ICD-10-CM

## 2013-11-02 ENCOUNTER — Encounter: Payer: 59 | Admitting: Family Medicine

## 2013-11-03 ENCOUNTER — Encounter: Payer: Self-pay | Admitting: Family Medicine

## 2013-11-03 ENCOUNTER — Ambulatory Visit (INDEPENDENT_AMBULATORY_CARE_PROVIDER_SITE_OTHER): Payer: 59 | Admitting: Family Medicine

## 2013-11-03 VITALS — BP 106/62 | HR 61 | Temp 97.6°F | Resp 16 | Ht 62.0 in | Wt 202.0 lb

## 2013-11-03 DIAGNOSIS — J40 Bronchitis, not specified as acute or chronic: Secondary | ICD-10-CM

## 2013-11-03 DIAGNOSIS — E119 Type 2 diabetes mellitus without complications: Secondary | ICD-10-CM

## 2013-11-03 DIAGNOSIS — Z0000 Encounter for general adult medical examination without abnormal findings: Secondary | ICD-10-CM

## 2013-11-03 DIAGNOSIS — E7800 Pure hypercholesterolemia, unspecified: Secondary | ICD-10-CM

## 2013-11-03 DIAGNOSIS — Z01419 Encounter for gynecological examination (general) (routine) without abnormal findings: Secondary | ICD-10-CM

## 2013-11-03 DIAGNOSIS — I10 Essential (primary) hypertension: Secondary | ICD-10-CM

## 2013-11-03 DIAGNOSIS — S29011A Strain of muscle and tendon of front wall of thorax, initial encounter: Secondary | ICD-10-CM

## 2013-11-03 LAB — CBC WITH DIFFERENTIAL/PLATELET
Basophils Absolute: 0.1 10*3/uL (ref 0.0–0.1)
Basophils Relative: 1 % (ref 0–1)
Eosinophils Absolute: 0.1 10*3/uL (ref 0.0–0.7)
Eosinophils Relative: 1 % (ref 0–5)
HEMATOCRIT: 37.6 % (ref 36.0–46.0)
Hemoglobin: 13 g/dL (ref 12.0–15.0)
LYMPHS PCT: 28 % (ref 12–46)
Lymphs Abs: 2 10*3/uL (ref 0.7–4.0)
MCH: 31.3 pg (ref 26.0–34.0)
MCHC: 34.6 g/dL (ref 30.0–36.0)
MCV: 90.6 fL (ref 78.0–100.0)
Monocytes Absolute: 0.5 10*3/uL (ref 0.1–1.0)
Monocytes Relative: 7 % (ref 3–12)
NEUTROS ABS: 4.5 10*3/uL (ref 1.7–7.7)
Neutrophils Relative %: 63 % (ref 43–77)
PLATELETS: 321 10*3/uL (ref 150–400)
RBC: 4.15 MIL/uL (ref 3.87–5.11)
RDW: 13.4 % (ref 11.5–15.5)
WBC: 7.1 10*3/uL (ref 4.0–10.5)

## 2013-11-03 LAB — LIPID PANEL
CHOL/HDL RATIO: 3.7 ratio
Cholesterol: 191 mg/dL (ref 0–200)
HDL: 51 mg/dL (ref >39)
LDL CALC: 93 mg/dL (ref 0–99)
Triglycerides: 235 mg/dL — ABNORMAL HIGH (ref <150)
VLDL: 47 mg/dL — ABNORMAL HIGH (ref 0–40)

## 2013-11-03 LAB — POCT URINALYSIS DIPSTICK
BILIRUBIN UA: NEGATIVE
Glucose, UA: NEGATIVE
Nitrite, UA: NEGATIVE
Protein, UA: NEGATIVE
RBC UA: NEGATIVE
Spec Grav, UA: 1.015
Urobilinogen, UA: 0.2
pH, UA: 5.5

## 2013-11-03 LAB — COMPLETE METABOLIC PANEL WITH GFR
ALBUMIN: 4.7 g/dL (ref 3.5–5.2)
ALT: 14 U/L (ref 0–35)
AST: 15 U/L (ref 0–37)
Alkaline Phosphatase: 53 U/L (ref 39–117)
BUN: 20 mg/dL (ref 6–23)
CALCIUM: 9.9 mg/dL (ref 8.4–10.5)
CHLORIDE: 99 meq/L (ref 96–112)
CO2: 27 meq/L (ref 19–32)
CREATININE: 0.92 mg/dL (ref 0.50–1.10)
GFR, EST AFRICAN AMERICAN: 79 mL/min
GFR, Est Non African American: 69 mL/min
Glucose, Bld: 105 mg/dL — ABNORMAL HIGH (ref 70–99)
Potassium: 4.1 mEq/L (ref 3.5–5.3)
SODIUM: 138 meq/L (ref 135–145)
TOTAL PROTEIN: 7.3 g/dL (ref 6.0–8.3)
Total Bilirubin: 0.6 mg/dL (ref 0.2–1.2)

## 2013-11-03 LAB — HEMOGLOBIN A1C
Hgb A1c MFr Bld: 6 % — ABNORMAL HIGH (ref <5.7)
MEAN PLASMA GLUCOSE: 126 mg/dL — AB (ref <117)

## 2013-11-03 MED ORDER — GLUCOSE BLOOD VI STRP: 1.0000 | ORAL_STRIP | Status: DC | PRN

## 2013-11-03 MED ORDER — FLUTICASONE PROPIONATE 50 MCG/ACT NA SUSP: 2.0000 | Freq: Every day | NASAL | Status: DC

## 2013-11-03 MED ORDER — LOSARTAN POTASSIUM-HCTZ 100-25 MG PO TABS: 1.0000 | ORAL_TABLET | Freq: Every day | ORAL | Status: DC

## 2013-11-03 MED ORDER — MONTELUKAST SODIUM 10 MG PO TABS: 10.0000 mg | ORAL_TABLET | Freq: Every day | ORAL | Status: DC

## 2013-11-03 MED ORDER — SIMVASTATIN 20 MG PO TABS: 20.0000 mg | ORAL_TABLET | Freq: Every day | ORAL | Status: DC

## 2013-11-03 MED ORDER — METFORMIN HCL 500 MG PO TABS: 500.0000 mg | ORAL_TABLET | Freq: Two times a day (BID) | ORAL | Status: DC

## 2013-11-03 NOTE — Progress Notes (Addendum)
This chart was scribed for [name], MD by Allena Earing, ED Scribe. This patient was seen in room 23 and the patient's care was started at 10:02 AM .  Subjective:    Patient ID: Elaine Jackson, female    DOB: 03/11/1956, 58 y.o.   MRN: [id number]  HPI   HPI Comments: Elaine Jackson is a 58 y.o. female who presents to the [facility] for a physical. She reports that she has some tenderness where her bra is  B ribs for three weeks. The pain is epigastric and began 2-3 weeks ago, the pain happens at random and is "aggravating". She states that the pain does not wake her up and that it "feels muscular". She reports falling in the yard in the last few weeks, but she does not believe she caused herself any injury. Denies n/v/d/c; denies GERD symptoms or dyspepsia.    She reports that her mother is still alive at 87 and that she now has dementia. Her father died at 73 with renal failure, CHF, and he may have had a MI. She states that one of her sisters passed away 2 years ago due to liver failure/cancer, her eldest sister has renal failure, her other sister is well, and her brother is a "heart attack waiting to happen". Pt does not exercise and now works part-time, she is semi retired.   She is taking all of her medications except trazodone.  Her last physical and her last pap smear were on 09/14/12. Her last mammogram was 09/22/13. She had a colonoscopy in 2009 and reports that it was "remarkable" and does not need another one for 10 years. She had tdap in 2013, pneumovax in 2010, Hep-B series in 2012, Hep-A series in 2012. She has her flu-shot in 04/2013. Pt does not wear any corrective wear for her vision and she goes to a dentist every 6 months.    Past Medical History  Diagnosis Date   Hypertension    Diabetes mellitus    Hyperlipidemia    Family history of breast cancer in first degree relative     sister age 4   Sleep related leg cramps    Allergic rhinitis,  cause unspecified    Obesity, unspecified    Unspecified vitamin D deficiency    Other chronic nonalcoholic liver disease    Chicken pox    Measles    Malignant neoplasm skin of face 02/16/2007    Basal Cell Carcinoma  [place]afe[facility].   Allergy    Heart murmur    Past Surgical History  Procedure Laterality Date   Gallbladder surgery  1994   Laparoscopic gastric banding  07/03/10    Dr. Sherrin Daisy; [place]   Cholecystectomy     Tonsillectomy     Colonoscopy  06/25/2007    Hung. Normal. Repeat in ten years.     Current Outpatient Prescriptions on File Prior to Visit  Medication Sig Dispense Refill   BAYER CONTOUR NEXT TEST test strip CHECK SUGAR ONCE A DAY  100 each  3   cholecalciferol (VITAMIN D) 1000 UNITS tablet Take 2,000 Units by mouth daily.       fluticasone (FLONASE) 50 MCG/ACT nasal spray Place 2 sprays into the nose daily.  16 g  6   Iron TABS Take by mouth daily.       losartan-hydrochlorothiazide (HYZAAR) 100-25 MG per tablet TAKE 1 TABLET BY MOUTH EVERY MORNING FOR BLOOD PRESSURE  90 tablet  3  metFORMIN (GLUCOPHAGE) 500 MG tablet Take 1 tablet (500 mg total) by mouth 2 (two) times daily with a meal.  180 tablet  3   montelukast (SINGULAIR) 10 MG tablet TAKE 1 TABLET BY MOUTH AT BEDTIME.  90 tablet  1   simvastatin (ZOCOR) 20 MG tablet TAKE 1 TABLET BY MOUTH AT BEDTIME.  90 tablet  1   traZODone (DESYREL) 50 MG tablet Take 1-2 tablets (50-100 mg total) by mouth at bedtime as needed for sleep.  60 tablet  5   No current facility-administered medications on file prior to visit.     Allergies  Allergen Reactions   Sulfur Nausea Only and Other (See Comments)    ALSO VOMITING   History   Social History   Marital Status: Married    Spouse Name: N/A    Number of Children: 1   Years of Education: N/A   Occupational History   HAIR STYLIST    Social History Main Topics   Smoking status: Former Smoker -- 1.00 packs/day for 25 years     Types: Cigarettes   Smokeless tobacco: Never Used     Comment: quit age 64     smoked from 59 to 24 off and on   Alcohol Use: No   Drug Use: No   Sexual Activity: Yes    Birth Control/ Protection: Post-menopausal   Other Topics Concern   Not on file   Social History Narrative   Marital status:  Married; x 24 years. Second marriage. Happily married; no abuse      Children: one child, one stepson;two grandsons      Lives: with husband.      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      Tobacco:  None      Alcohol: rare/minimal.      Drugs:  None       Exercise: none       Seatbelt:  100%.       Sunscreen: SPF 15.      Guns:  Maybe?   Family History  Problem Relation Age of Onset   Diabetes Mother    Heart disease Mother     CHF with defibrillator; carotid artery stenosis.   Hypertension Mother    Hyperlipidemia Mother    Dementia Mother    Heart disease Father    Kidney disease Father     renal failure ESRD/peritoneal dialysis   Diabetes Father    Hypertension Father    Hyperlipidemia Father    Stroke Father    Diabetes Sister    Hypertension Sister    Kidney disease Sister     renal failure   Diabetes Brother    Hypertension Brother    Hyperlipidemia Brother    Arthritis Brother    Cancer Sister     cervical cancer   Cancer Sister 11    Liver cancer from hepatitis C.    Review of Systems  Constitutional: Negative for fever, chills, diaphoresis, activity change, appetite change, fatigue and unexpected weight change.  HENT: Negative for congestion, dental problem, drooling, ear discharge, ear pain, facial swelling, hearing loss, mouth sores, nosebleeds, postnasal drip, rhinorrhea, sinus pressure, sneezing, sore throat, tinnitus, trouble swallowing and voice change.   Eyes: Negative.   Respiratory: Negative.  Negative for chest tightness and shortness of breath.   Cardiovascular: Negative for chest pain, palpitations and leg  swelling.  Gastrointestinal: Positive for abdominal pain (epigastric, thinks it may be due to bra). Negative  for nausea, vomiting, diarrhea, constipation, blood in stool, abdominal distention, anal bleeding and rectal pain.  Endocrine: Negative.   Genitourinary: Negative.   Musculoskeletal: Negative.   Skin: Negative.   Allergic/Immunologic: Positive for environmental allergies. Negative for food allergies and immunocompromised state.  Neurological: Negative.   Hematological: Negative.   Psychiatric/Behavioral: Negative.  Negative for confusion.       Objective:   Physical Exam  Nursing note and vitals reviewed. Constitutional: She is oriented to person, place, and time. She appears well-developed and well-nourished. No distress.  HENT:  Head: Normocephalic and atraumatic.  Right Ear: External ear normal.  Left Ear: External ear normal.  Nose: Nose normal.  Mouth/Throat: Oropharynx is clear and moist.  Eyes: Conjunctivae and EOM are normal. Pupils are equal, round, and reactive to light. No scleral icterus.  Neck: Normal range of motion. Neck supple. Carotid bruit is not present. No thyromegaly present.  Cardiovascular: Normal rate, regular rhythm and intact distal pulses.  Exam reveals no gallop and no friction rub.   Murmur heard.  Systolic murmur is present with a grade of 2/6  Pulmonary/Chest: Effort normal and breath sounds normal. No stridor. She has no wheezes. She has no rales. She exhibits no tenderness.  Abdominal: Soft. Bowel sounds are normal. She exhibits no distension. There is no tenderness. There is no rebound.  Genitourinary: Vagina normal and uterus normal. No breast swelling, tenderness, discharge or bleeding. There is no rash, tenderness or lesion on the right labia. There is no rash, tenderness or lesion on the left labia. Cervix exhibits no motion tenderness, no discharge and no friability. Right adnexum displays no mass, no tenderness and no fullness. Left adnexum  displays no mass, no tenderness and no fullness.  Musculoskeletal: Normal range of motion. She exhibits no edema and no tenderness.  Lymphadenopathy:    She has no cervical adenopathy.  Neurological: She is alert and oriented to person, place, and time. She exhibits normal muscle tone. Coordination normal.  Skin: Skin is warm. No rash noted. She is not diaphoretic. No erythema.  Psychiatric: She has a normal mood and affect. Her behavior is normal. Judgment and thought content normal.    Filed Vitals:   11/03/13 0947  BP: 106/62  Pulse: 61  Temp: 97.6 F (36.4 C)  Resp: 16   Results for orders placed in visit on 11/03/13  POCT URINALYSIS DIPSTICK      Result Value Ref Range   Color, UA yellow     Clarity, UA clear     Glucose, UA neg     Bilirubin, UA neg     Ketones, UA trace     Spec Grav, UA 1.015     Blood, UA neg     pH, UA 5.5     Protein, UA neg     Urobilinogen, UA 0.2     Nitrite, UA neg     Leukocytes, UA Trace         Assessment & Plan:  Routine general medical examination at a health care facility - Plan: CBC with Differential, COMPLETE METABOLIC PANEL WITH GFR, TSH, POCT urinalysis dipstick, Microalbumin, urine, Pap IG w/ reflex to HPV when ASC-U, EKG 12-Lead  Pure hypercholesterolemia - Plan: Lipid panel  Essential hypertension, benign - Plan: CBC with Differential  Type II or unspecified type diabetes mellitus without mention of complication, not stated as uncontrolled - Plan: Hemoglobin A1c, Microalbumin, urine  1.  Complete physical examination: anticipatory guidance --- exercise, weight loss, 3 servings  of dairy daily.  Pap smear UTD.  Mammogram UTD.  Colonoscopy UTD.  Immunizations UTD.   2.  DMII: controlled; obtain labs; refill provided; eye exam UTD.  Monofilament UTD.  Obtain microalbumin. 3.  HTN: controlled; obtain labs; refill provided. 4.  Hyperlipidemia: controlled; obtain labs; refill provided. 5.  Chest wall strain B:  New and improved  now; benign exam; location of previous pain along distal ribs B.  No orders of the defined types were placed in this encounter.     I personally performed the services described in this documentation, which was scribed in my presence. The recorded information has been reviewed and is accurate.  Reginia Forts, M.D.  [facility] [address] [place], [place]  [address] [phone] phone [phone] fax

## 2013-11-04 LAB — PAP IG W/ RFLX HPV ASCU

## 2013-11-04 LAB — TSH: TSH: 1.109 u[IU]/mL (ref 0.350–4.500)

## 2013-11-04 LAB — MICROALBUMIN, URINE: Microalb, Ur: 1.43 mg/dL (ref 0.00–1.89)

## 2014-02-02 ENCOUNTER — Ambulatory Visit (INDEPENDENT_AMBULATORY_CARE_PROVIDER_SITE_OTHER): Payer: 59 | Admitting: Family Medicine

## 2014-02-02 ENCOUNTER — Encounter: Payer: Self-pay | Admitting: Family Medicine

## 2014-02-02 VITALS — BP 122/75 | HR 64 | Temp 97.6°F | Resp 16 | Ht 62.5 in | Wt 201.8 lb

## 2014-02-02 DIAGNOSIS — E119 Type 2 diabetes mellitus without complications: Secondary | ICD-10-CM

## 2014-02-02 DIAGNOSIS — I10 Essential (primary) hypertension: Secondary | ICD-10-CM

## 2014-02-02 DIAGNOSIS — IMO0001 Reserved for inherently not codable concepts without codable children: Secondary | ICD-10-CM

## 2014-02-02 DIAGNOSIS — E7800 Pure hypercholesterolemia, unspecified: Secondary | ICD-10-CM

## 2014-02-02 LAB — HEMOGLOBIN A1C
Hgb A1c MFr Bld: 5.8 % — ABNORMAL HIGH (ref <5.7)
Mean Plasma Glucose: 120 mg/dL — ABNORMAL HIGH (ref <117)

## 2014-02-02 LAB — CBC WITH DIFFERENTIAL/PLATELET
Basophils Absolute: 0 10*3/uL (ref 0.0–0.1)
Basophils Relative: 0 % (ref 0–1)
Eosinophils Absolute: 0.1 10*3/uL (ref 0.0–0.7)
Eosinophils Relative: 2 % (ref 0–5)
HCT: 37.8 % (ref 36.0–46.0)
HEMOGLOBIN: 12.8 g/dL (ref 12.0–15.0)
LYMPHS PCT: 30 % (ref 12–46)
Lymphs Abs: 2.1 10*3/uL (ref 0.7–4.0)
MCH: 30.4 pg (ref 26.0–34.0)
MCHC: 33.9 g/dL (ref 30.0–36.0)
MCV: 89.8 fL (ref 78.0–100.0)
MONOS PCT: 6 % (ref 3–12)
Monocytes Absolute: 0.4 10*3/uL (ref 0.1–1.0)
NEUTROS ABS: 4.4 10*3/uL (ref 1.7–7.7)
Neutrophils Relative %: 62 % (ref 43–77)
PLATELETS: 343 10*3/uL (ref 150–400)
RBC: 4.21 MIL/uL (ref 3.87–5.11)
RDW: 12.9 % (ref 11.5–15.5)
WBC: 7.1 10*3/uL (ref 4.0–10.5)

## 2014-02-02 LAB — LIPID PANEL
Cholesterol: 168 mg/dL (ref 0–200)
HDL: 57 mg/dL (ref >39)
LDL Cholesterol: 80 mg/dL (ref 0–99)
Total CHOL/HDL Ratio: 2.9 Ratio
Triglycerides: 157 mg/dL — ABNORMAL HIGH (ref <150)
VLDL: 31 mg/dL (ref 0–40)

## 2014-02-02 LAB — CK: Total CK: 87 U/L (ref 7–177)

## 2014-02-02 LAB — COMPLETE METABOLIC PANEL WITH GFR
ALBUMIN: 4.7 g/dL (ref 3.5–5.2)
ALT: 16 U/L (ref 0–35)
AST: 16 U/L (ref 0–37)
Alkaline Phosphatase: 56 U/L (ref 39–117)
BILIRUBIN TOTAL: 0.6 mg/dL (ref 0.2–1.2)
BUN: 17 mg/dL (ref 6–23)
CHLORIDE: 101 meq/L (ref 96–112)
CO2: 26 meq/L (ref 19–32)
Calcium: 10 mg/dL (ref 8.4–10.5)
Creat: 0.81 mg/dL (ref 0.50–1.10)
GFR, EST NON AFRICAN AMERICAN: 80 mL/min
GLUCOSE: 108 mg/dL — AB (ref 70–99)
POTASSIUM: 4.3 meq/L (ref 3.5–5.3)
Sodium: 137 mEq/L (ref 135–145)
TOTAL PROTEIN: 7.3 g/dL (ref 6.0–8.3)

## 2014-02-02 NOTE — Patient Instructions (Signed)
1.  HOLD CHOLESTEROL MEDICATION FOR ONE MONTH.

## 2014-02-02 NOTE — Progress Notes (Signed)
Subjective:    Patient ID: DENIM START, female    DOB: Nov 12, 1955, 58 y.o.   MRN: [id number]  HPI Chief Complaint  Patient presents with  . Diabetes    3 month follow up  . Hypertension  . Hyperlipidemia    This chart was scribed for Elaine Honour, MD by Elaine Jackson, ED Scribe. This patient was seen in room 21 and the patient's care was started at 9:14 AM.  HPI Comments: Elaine Jackson is a 58 y.o. female who presents to the [facility] for a 3 months f/u regarding HTN, hypercholesterolemia, and DM. Pt was last seen 3 months ago for physical exam. No changes were made to management during last visit.   HTN. Pt denies check BP.  She reports SOB only with exercise. Pt denies leg swelling, CP, and numbness and tingling in legs. Patient reports good compliance with medication, good tolerance to medication, and good symptom control.    CHOLESTEROL.  Pt complains of progressively worsening bilateral upper arm pain. Pt reports pain only occurs when she is laying on her side at night  and describes the pain as uncomfortable,tingling, and irritating. Pt states she is fine during the day working. She reports trouble sleeping due to pain. Pt believes this if from cholesterol medication.    Patient reports good compliance with medication, good tolerance to medication, and good symptom control.    DIABETES.  Pt checks her sugars every morning and reports it was 117 this morning.  She reports her sugar at it's highest is 152 highest and 107 lowest.  Pt takes metformin every morning. She report she noticed that eating after taking metformin causes diarrhea.  She reports regular BM. She denies hematochezia. Patient reports good compliance with medication, good tolerance to medication, and good symptom control.  Denies polyuria, polydipsia, numbness or tingling in extremities.   Past Medical History  Diagnosis Date  . Hypertension   . Diabetes mellitus   . Hyperlipidemia   .  Family history of breast cancer in first degree relative     sister age 37  . Sleep related leg cramps   . Allergic rhinitis, cause unspecified   . Obesity, unspecified   . Unspecified vitamin D deficiency   . Other chronic nonalcoholic liver disease   . Chicken pox   . Measles   . Malignant neoplasm skin of face 02/16/2007    Basal Cell Carcinoma  Tafeen.  . Allergy   . Heart murmur    Past Surgical History  Procedure Laterality Date  . Gallbladder surgery  1994  . Laparoscopic gastric banding  07/03/10    Dr. Sherrin Daisy; [facility]  . Cholecystectomy    . Tonsillectomy    . Colonoscopy  06/25/2007    Hung. Normal. Repeat in ten years.   Prior to Admission medications   Medication Sig Start Date End Date Taking? Authorizing Provider  cholecalciferol (VITAMIN D) 1000 UNITS tablet Take 2,000 Units by mouth daily.    Historical Provider, MD  fluticasone (FLONASE) 50 MCG/ACT nasal spray Place 2 sprays into both nostrils daily. 11/03/13   Elaine Honour, MD  glucose blood (BAYER CONTOUR NEXT TEST) test strip 1 each by Other route as needed for other. Use as instructed 11/03/13   Elaine Honour, MD  Iron TABS Take by mouth daily.    Historical Provider, MD  losartan-hydrochlorothiazide (HYZAAR) 100-25 MG per tablet Take 1 tablet by mouth daily. 11/03/13   Steffanie Dunn  Koren Bound, MD  metFORMIN (GLUCOPHAGE) 500 MG tablet Take 1 tablet (500 mg total) by mouth 2 (two) times daily with a meal. 11/03/13   Elaine Honour, MD  montelukast (SINGULAIR) 10 MG tablet Take 1 tablet (10 mg total) by mouth at bedtime. 11/03/13   Elaine Honour, MD  simvastatin (ZOCOR) 20 MG tablet Take 1 tablet (20 mg total) by mouth at bedtime. 11/03/13   Elaine Honour, MD  traZODone (DESYREL) 50 MG tablet Take 1-2 tablets (50-100 mg total) by mouth at bedtime as needed for sleep. 01/20/13   Elaine Honour, MD   Review of Systems  Constitutional: Negative for fever, chills, diaphoresis, fatigue and unexpected weight change.   Eyes: Negative for visual disturbance.  Respiratory: Positive for shortness of breath ( with exercise). Negative for cough.   Cardiovascular: Negative for chest pain, palpitations and leg swelling.  Gastrointestinal: Positive for diarrhea. Negative for nausea, vomiting, abdominal pain, constipation and blood in stool.  Endocrine: Negative for cold intolerance, heat intolerance, polydipsia, polyphagia and polyuria.  Musculoskeletal: Positive for myalgias.  Skin: Negative for color change, pallor, rash and wound.  Neurological: Negative for dizziness, tremors, seizures, syncope, facial asymmetry, speech difficulty, weakness, light-headedness, numbness and headaches.    Objective:  Physical Exam  Nursing note and vitals reviewed. Constitutional: She is oriented to person, place, and time. She appears well-developed and well-nourished. No distress.  HENT:  Head: Normocephalic and atraumatic.  Right Ear: External ear normal.  Left Ear: External ear normal.  Nose: Nose normal.  Mouth/Throat: Oropharynx is clear and moist. No oropharyngeal exudate.  Eyes: Conjunctivae and EOM are normal. Pupils are equal, round, and reactive to light.  Neck: Normal range of motion. Neck supple. Carotid bruit is not present. No thyromegaly present.  Cardiovascular: Normal rate, regular rhythm, normal heart sounds and intact distal pulses.  Exam reveals no gallop and no friction rub.   No murmur heard. Pulmonary/Chest: Effort normal and breath sounds normal. No respiratory distress. She has no wheezes. She has no rales.  Abdominal: Soft. Bowel sounds are normal. She exhibits no distension and no mass. There is no tenderness. There is no rebound and no guarding.  Musculoskeletal: Normal range of motion.       Right shoulder: Normal. She exhibits normal range of motion, no tenderness, no bony tenderness, no pain, no spasm and normal strength.       Left shoulder: Normal. She exhibits normal range of motion, no  tenderness, no bony tenderness, no pain, no spasm and normal strength.       Cervical back: Normal.  Lymphadenopathy:    She has no cervical adenopathy.  Neurological: She is alert and oriented to person, place, and time. No cranial nerve deficit.  Skin: Skin is warm and dry. No rash noted. She is not diaphoretic. No erythema. No pallor.  Psychiatric: She has a normal mood and affect. Her behavior is normal.    Assessment & Plan:   1. Essential hypertension, benign   2. Pure hypercholesterolemia   3. Type II or unspecified type diabetes mellitus without mention of complication, not stated as uncontrolled   4. Myalgia and myositis    1. HTN: controlled; obtain labs; continue current medications. 2.  Hypercholesterolemia: controlled; obtain labs; continue current medications.  3.  DMII: controlled; continue Metformin after meal.  Obtain labs.  No change to management. 4.  Myalgias:  New. B upper extremities and some proximal leg pain. Obtain CK.  Advised to HOLD statin  for one month.   I personally performed the services described in this documentation, which was scribed in my presence.  The recorded information has been reviewed and is accurate.  Reginia Forts, M.D.  [facility] [address] [place], [place]  [address] [phone] phone [phone] fax

## 2014-03-14 ENCOUNTER — Ambulatory Visit (INDEPENDENT_AMBULATORY_CARE_PROVIDER_SITE_OTHER): Payer: 59

## 2014-03-14 DIAGNOSIS — Z23 Encounter for immunization: Secondary | ICD-10-CM

## 2014-04-02 ENCOUNTER — Telehealth: Payer: Self-pay

## 2014-04-02 NOTE — Telephone Encounter (Signed)
Pt received noticed from her insurance company that they would cover a new one-touch glucose meter. However, she needs an rx in order for them to cover it. She would like to know if Dr. Tamala Julian could write an rx for this please!

## 2014-04-03 MED ORDER — ONETOUCH ULTRASOFT LANCETS MISC: Status: DC

## 2014-04-03 MED ORDER — FREESTYLE SYSTEM KIT: 1.0000 | PACK | Status: DC | PRN

## 2014-04-03 MED ORDER — GLUCOSE BLOOD VI STRP: ORAL_STRIP | Status: DC

## 2014-04-03 NOTE — Telephone Encounter (Signed)
One touch ultra 2  [facility] [address]  Called in for pt

## 2014-04-25 ENCOUNTER — Other Ambulatory Visit: Payer: Self-pay | Admitting: Physician Assistant

## 2014-05-11 ENCOUNTER — Ambulatory Visit: Payer: 59 | Admitting: Family Medicine

## 2014-05-20 ENCOUNTER — Ambulatory Visit (INDEPENDENT_AMBULATORY_CARE_PROVIDER_SITE_OTHER): Payer: 59 | Admitting: Emergency Medicine

## 2014-05-20 ENCOUNTER — Other Ambulatory Visit: Payer: Self-pay | Admitting: Physician Assistant

## 2014-05-20 VITALS — BP 136/78 | HR 69 | Temp 98.0°F | Resp 18 | Ht 62.0 in | Wt 204.4 lb

## 2014-05-20 DIAGNOSIS — J209 Acute bronchitis, unspecified: Secondary | ICD-10-CM

## 2014-05-20 DIAGNOSIS — J0100 Acute maxillary sinusitis, unspecified: Secondary | ICD-10-CM

## 2014-05-20 MED ORDER — HYDROCOD POLST-CHLORPHEN POLST 10-8 MG/5ML PO LQCR: 5.0000 mL | Freq: Two times a day (BID) | ORAL | Status: DC | PRN

## 2014-05-20 MED ORDER — AMOXICILLIN 875 MG PO TABS
875.0000 mg | ORAL_TABLET | Freq: Two times a day (BID) | ORAL | Status: DC
Start: 2014-05-20 — End: 2014-06-06

## 2014-05-20 MED ORDER — PSEUDOEPHEDRINE-GUAIFENESIN ER 60-600 MG PO TB12: 1.0000 | ORAL_TABLET | Freq: Two times a day (BID) | ORAL | Status: DC

## 2014-05-20 NOTE — Progress Notes (Signed)
[facility] [address] [place] [address] [phone]  Date:  05/20/2014   Name:  Elaine Jackson   DOB:  04/01/1956   MRN:  [id number]  PCP:  Reginia Forts, MD    Chief Complaint: Cough   History of Present Illness:  Elaine Jackson is a 58 y.o. very pleasant female patient who presents with the following:  Ill two weeks with nasal congestion and post nasal drainage.   Has cough productive of scant sputum.  Some wheezing at night. No shortness of breath Sore throat. No fever or chills Some malaise Appetite normal. No improvement with over the counter medications or other home remedies.  Denies other complaint or health concern today.   Patient Active Problem List   Diagnosis Date Noted  . Unspecified vitamin D deficiency 01/20/2013  . Insomnia 01/20/2013  . Routine general medical examination at a health care facility 09/14/2012  . Routine gynecological examination 09/14/2012  . Unspecified essential hypertension 09/14/2012  . Nail dystrophy 09/14/2012  . Pure hypercholesterolemia 06/08/2012  . Anemia, unspecified 06/08/2012  . Grief reaction 06/08/2012  . Type II or unspecified type diabetes mellitus without mention of complication, not stated as uncontrolled 04/16/2012  . Essential hypertension, benign 04/16/2012  . Other and unspecified hyperlipidemia 04/16/2012  . Flu vaccine need 04/16/2012  . Obesity 04/16/2012  . Hx of laparoscopic gastric banding 02/13/2011    Past Medical History  Diagnosis Date  . Hypertension   . Diabetes mellitus   . Hyperlipidemia   . Family history of breast cancer in first degree relative     sister age 84  . Sleep related leg cramps   . Allergic rhinitis, cause unspecified   . Obesity, unspecified   . Unspecified vitamin D deficiency   . Other chronic nonalcoholic liver disease   . Chicken pox   . Measles   . Malignant neoplasm skin of face 02/16/2007    Basal Cell Carcinoma  Tafeen.  . Allergy   .  Heart murmur     Past Surgical History  Procedure Laterality Date  . Gallbladder surgery  1994  . Laparoscopic gastric banding  07/03/10    Dr. Sherrin Daisy; [facility]  . Cholecystectomy    . Tonsillectomy    . Colonoscopy  06/25/2007    Hung. Normal. Repeat in ten years.    History  Substance Use Topics  . Smoking status: Former Smoker -- 1.00 packs/day for 25 years    Types: Cigarettes  . Smokeless tobacco: Never Used     Comment: quit age 15     smoked from 70 to 64 off and on  . Alcohol Use: No    Family History  Problem Relation Age of Onset  . Diabetes Mother   . Heart disease Mother     CHF with defibrillator; carotid artery stenosis.  . Hypertension Mother   . Hyperlipidemia Mother   . Dementia Mother   . Heart disease Father   . Kidney disease Father     renal failure ESRD/peritoneal dialysis  . Diabetes Father   . Hypertension Father   . Hyperlipidemia Father   . Stroke Father   . Diabetes Sister   . Hypertension Sister   . Kidney disease Sister     renal failure  . Diabetes Brother   . Hypertension Brother   . Hyperlipidemia Brother   . Arthritis Brother   . Cancer Sister     cervical cancer  . Cancer Sister  56    Liver cancer from hepatitis C.    Allergies  Allergen Reactions  . Sulfur Nausea Only and Other (See Comments)    ALSO VOMITING    Medication list has been reviewed and updated.  Current Outpatient Prescriptions on File Prior to Visit  Medication Sig Dispense Refill  . cholecalciferol (VITAMIN D) 1000 UNITS tablet Take 2,000 Units by mouth daily.    Marland Kitchen glucose blood (FREESTYLE TEST STRIPS) test strip Use as instructed One touch ultra 2 100 each 12  . glucose monitoring kit (FREESTYLE) monitoring kit 1 each by Does not apply route as needed for other. One touch ultra 2 1 each 0  . Iron TABS Take by mouth daily.    . Lancets (ONETOUCH ULTRASOFT) lancets Use as instructed One touch ultra 2 100 each 12  . losartan-hydrochlorothiazide  (HYZAAR) 100-25 MG per tablet TAKE 1 TABLET EVERY MORNING FOR BLOOD PRESSURE 30 tablet 2  . metFORMIN (GLUCOPHAGE) 500 MG tablet Take 1 tablet (500 mg total) by mouth 2 (two) times daily with a meal. 180 tablet 3  . montelukast (SINGULAIR) 10 MG tablet Take 1 tablet (10 mg total) by mouth at bedtime. 90 tablet 3  . fluticasone (FLONASE) 50 MCG/ACT nasal spray Place 2 sprays into both nostrils daily. (Patient not taking: Reported on 05/20/2014) 16 g 11  . simvastatin (ZOCOR) 20 MG tablet Take 1 tablet (20 mg total) by mouth at bedtime. (Patient not taking: Reported on 05/20/2014) 90 tablet 3   No current facility-administered medications on file prior to visit.    Review of Systems:  As per HPI, otherwise negative.    Physical Examination: Filed Vitals:   05/20/14 1412  BP: 136/78  Pulse: 69  Temp: 98 F (36.7 C)  Resp: 18   Filed Vitals:   05/20/14 1412  Height: _0  (1.575 m)  Weight: 204 lb 6.4 oz (92.715 kg)   Body mass index is 37.38 kg/(m^2). Ideal Body Weight: Weight in (lb) to have BMI = 25: 136.4  GEN: WDWN, NAD, Non-toxic, A & O x 3 HEENT: Atraumatic, Normocephalic. Neck supple. No masses, No LAD. Ears and Nose: No external deformity. CV: RRR, No M/G/R. No JVD. No thrill. No extra heart sounds. PULM: CTA B, no wheezes, crackles, rhonchi. No retractions. No resp. distress. No accessory muscle use. ABD: S, NT, ND, +BS. No rebound. No HSM. EXTR: No c/c/e NEURO Normal gait.  PSYCH: Normally interactive. Conversant. Not depressed or anxious appearing.  Calm demeanor.    Assessment and Plan: Bronchitis tussionex Sinusitis Amoxicillin mucinex d  Signed,  Ellison Carwin, MD

## 2014-05-20 NOTE — Patient Instructions (Signed)

## 2014-05-23 ENCOUNTER — Other Ambulatory Visit: Payer: Self-pay | Admitting: Physician Assistant

## 2014-05-30 ENCOUNTER — Ambulatory Visit: Payer: 59 | Admitting: Family Medicine

## 2014-06-06 ENCOUNTER — Encounter: Payer: Self-pay | Admitting: Family Medicine

## 2014-06-06 ENCOUNTER — Ambulatory Visit (INDEPENDENT_AMBULATORY_CARE_PROVIDER_SITE_OTHER): Payer: 59 | Admitting: Family Medicine

## 2014-06-06 VITALS — BP 120/80 | HR 60 | Temp 97.8°F | Resp 16 | Ht 62.0 in | Wt 200.4 lb

## 2014-06-06 DIAGNOSIS — E119 Type 2 diabetes mellitus without complications: Secondary | ICD-10-CM

## 2014-06-06 DIAGNOSIS — E7800 Pure hypercholesterolemia, unspecified: Secondary | ICD-10-CM

## 2014-06-06 DIAGNOSIS — J069 Acute upper respiratory infection, unspecified: Secondary | ICD-10-CM

## 2014-06-06 DIAGNOSIS — Z23 Encounter for immunization: Secondary | ICD-10-CM

## 2014-06-06 DIAGNOSIS — I10 Essential (primary) hypertension: Secondary | ICD-10-CM

## 2014-06-06 LAB — COMPREHENSIVE METABOLIC PANEL
ALBUMIN: 4.5 g/dL (ref 3.5–5.2)
ALT: 20 U/L (ref 0–35)
AST: 17 U/L (ref 0–37)
Alkaline Phosphatase: 68 U/L (ref 39–117)
BUN: 15 mg/dL (ref 6–23)
CALCIUM: 9.7 mg/dL (ref 8.4–10.5)
CHLORIDE: 103 meq/L (ref 96–112)
CO2: 25 mEq/L (ref 19–32)
Creat: 0.72 mg/dL (ref 0.50–1.10)
Glucose, Bld: 102 mg/dL — ABNORMAL HIGH (ref 70–99)
POTASSIUM: 4.1 meq/L (ref 3.5–5.3)
SODIUM: 137 meq/L (ref 135–145)
Total Bilirubin: 0.6 mg/dL (ref 0.2–1.2)
Total Protein: 7.2 g/dL (ref 6.0–8.3)

## 2014-06-06 LAB — CBC WITH DIFFERENTIAL/PLATELET
BASOS ABS: 0 10*3/uL (ref 0.0–0.1)
Basophils Relative: 0 % (ref 0–1)
Eosinophils Absolute: 0.1 10*3/uL (ref 0.0–0.7)
Eosinophils Relative: 1 % (ref 0–5)
HCT: 35.2 % — ABNORMAL LOW (ref 36.0–46.0)
Hemoglobin: 12 g/dL (ref 12.0–15.0)
Lymphocytes Relative: 29 % (ref 12–46)
Lymphs Abs: 2 10*3/uL (ref 0.7–4.0)
MCH: 30.2 pg (ref 26.0–34.0)
MCHC: 34.1 g/dL (ref 30.0–36.0)
MCV: 88.7 fL (ref 78.0–100.0)
MPV: 9.4 fL (ref 9.4–12.4)
Monocytes Absolute: 0.4 10*3/uL (ref 0.1–1.0)
Monocytes Relative: 6 % (ref 3–12)
NEUTROS ABS: 4.4 10*3/uL (ref 1.7–7.7)
Neutrophils Relative %: 64 % (ref 43–77)
PLATELETS: 322 10*3/uL (ref 150–400)
RBC: 3.97 MIL/uL (ref 3.87–5.11)
RDW: 13.3 % (ref 11.5–15.5)
WBC: 6.8 10*3/uL (ref 4.0–10.5)

## 2014-06-06 MED ORDER — ZOSTER VACCINE LIVE 19400 UNT/0.65ML ~~LOC~~ SOLR: 0.6500 mL | Freq: Once | SUBCUTANEOUS | Status: DC

## 2014-06-06 MED ORDER — AMOXICILLIN-POT CLAVULANATE 875-125 MG PO TABS: 1.0000 | ORAL_TABLET | Freq: Two times a day (BID) | ORAL | Status: DC

## 2014-06-06 MED ORDER — HYDROCOD POLST-CHLORPHEN POLST 10-8 MG/5ML PO LQCR: 5.0000 mL | Freq: Two times a day (BID) | ORAL | Status: DC | PRN

## 2014-06-06 MED ORDER — FLUTICASONE PROPIONATE 50 MCG/ACT NA SUSP: 2.0000 | Freq: Every day | NASAL | Status: DC

## 2014-06-06 MED ORDER — PREDNISONE 20 MG PO TABS: ORAL_TABLET | ORAL | Status: DC

## 2014-06-06 NOTE — Progress Notes (Signed)
Subjective:    Patient ID: Elaine Jackson, female    DOB: 1956/03/06, 58 y.o.   MRN: [id number]  HPI Patient presents for follow up of HTN, DM, and 4 week cough. Was treated for bronchitis 2 weeks ago with Amoxicillin without any change in sx. Cough is productive with thick mucus and is accompanied by congestion, rhinorrhea, swollen lymph nodes and tongue, and itchy ear. Denies fever, N/V, or fatigue. No h/o asthma, but has allergies year round.  Home glucose usually around 115, however, this morning reading was 138 as she did not take medication last night because she did not have much to eat for dinner. Only has side effect of diarrhea from metformin if she has not eaten. Had lap band surgery 3 years ago (2012) and can only eat small portions now. Foods include increased protein and vegetables. No bread or pasta. Does not exercise and not interested at this time. 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  Does not check BP at home and is generally compliant with medications. Medication is well tolerated. Denies SOB, CP, edema, or HA/dizziness.  Health Maintenance: Diabetic foot and eye exam today.  Review of Systems  Constitutional: Negative for fever, activity change, appetite change and fatigue.  HENT: Positive for congestion, ear pain (itchy), postnasal drip and rhinorrhea. Negative for ear discharge, sinus pressure and sore throat.   Eyes: Negative for itching.  Respiratory: Positive for cough (productive with thick mucus and not getting deep breath). Negative for shortness of breath.   Cardiovascular: Negative for chest pain, palpitations and leg swelling.  Gastrointestinal: Negative for nausea and vomiting.  Neurological: Negative for dizziness, light-headedness and headaches.  Hematological: Positive for adenopathy.       Objective:   Physical Exam  Constitutional: She appears well-developed and well-nourished. No distress.  Blood pressure 120/80, pulse 60, temperature  97.8 F (36.6 C), temperature source Oral, resp. rate 16, height 5\' 2"  (1.575 m), weight 200 lb 6.4 oz (90.901 kg), SpO2 97 %.  HENT:  Head: Normocephalic and atraumatic.  Right Ear: Tympanic membrane, external ear and ear canal normal.  Left Ear: Tympanic membrane, external ear and ear canal normal.  Nose: Rhinorrhea present. No mucosal edema or sinus tenderness. Right sinus exhibits no maxillary sinus tenderness and no frontal sinus tenderness. Left sinus exhibits no maxillary sinus tenderness and no frontal sinus tenderness.  Mouth/Throat: Uvula is midline and mucous membranes are normal. Posterior oropharyngeal erythema present. No oropharyngeal exudate or posterior oropharyngeal edema.  Eyes: Conjunctivae are normal. Pupils are equal, round, and reactive to light. Right eye exhibits no discharge. Left eye exhibits no discharge. No scleral icterus.  Neck: Normal range of motion. Neck supple. No thyromegaly present.  Cardiovascular: Normal rate, regular rhythm, normal heart sounds and intact distal pulses.  Exam reveals no gallop and no friction rub.   No murmur heard. Pulmonary/Chest: Effort normal and breath sounds normal. No respiratory distress. She has no wheezes. She has no rales.  Abdominal: Soft. Bowel sounds are normal. She exhibits no distension. There is no tenderness.  Lymphadenopathy:    She has cervical adenopathy.  Skin: She is not diaphoretic.       Assessment & Plan:  1. Diabetes mellitus without complication Well controlled with medication and diet. - HM Diabetes Foot Exam - CBC with Differential - Comprehensive metabolic panel - Hemoglobin A1c  2. Acute upper respiratory infection - amoxicillin-clavulanate (AUGMENTIN) 875-125 MG per tablet; Take 1 tablet by mouth 2 (  two) times daily.  Dispense: 20 tablet; Refill: 0 - fluticasone (FLONASE) 50 MCG/ACT nasal spray; Place 2 sprays into both nostrils daily.  Dispense: 16 g; Refill: 11 - prednisone taper 20mg  -  Tussionex for cough.  3. Essential hypertension, benign Controlled with medication.    Alveta Heimlich PA-C  [facility] 06/06/2014 1:41 PM

## 2014-06-07 LAB — HEMOGLOBIN A1C
Hgb A1c MFr Bld: 6.2 % — ABNORMAL HIGH (ref <5.7)
MEAN PLASMA GLUCOSE: 131 mg/dL — AB (ref <117)

## 2014-06-07 LAB — HM DIABETES EYE EXAM

## 2014-06-07 NOTE — Progress Notes (Signed)
History and physical examinations obtained with Tishira Brewington, PA-C. Pt held Simvastatin since last visit due to muscle aches.  Agree as outlined and as noted below: A/P: 1. Diabetes mellitus without complication -Controlled.  S/p eye exam today.  No changes to management.  Obtain labs.  S/p Pneumovax. - HM Diabetes Foot Exam - CBC with Differential - Comprehensive metabolic panel - Hemoglobin A1c  2. Essential hypertension, benign -Controlled; no changes to management; obtain labs.  3. Pure hypercholesterolemia -Stable; has been holding Simvastatin since last visit due to muscle aches.  Repeat FLP at next visit.  Continue to HOLD Simvastatin for six months.  4. Acute upper respiratory infection -Persistent; rx for Augmentin and Flonase provided.  Rx for Prednisone and Tussionex also provided due to significant cough and congestion.  Call in two weeks if not improved; will add Doxy for atypical coverage. - amoxicillin-clavulanate (AUGMENTIN) 875-125 MG per tablet; Take 1 tablet by mouth 2 (two) times daily.  Dispense: 20 tablet; Refill: 0 - fluticasone (FLONASE) 50 MCG/ACT nasal spray; Place 2 sprays into both nostrils daily.  Dispense: 16 g; Refill: 11  5. Need for prophylactic vaccination against Streptococcus pneumoniae (pneumococcus) -Administered today. - Pneumococcal polysaccharide vaccine 23-valent greater than or equal to 2yo subcutaneous/IM  Reginia Forts, M.D.  [facility] [address] [place], [place]  [address] [phone] phone [phone] fax

## 2014-06-30 ENCOUNTER — Encounter: Payer: Self-pay | Admitting: *Deleted

## 2014-06-30 ENCOUNTER — Telehealth: Payer: Self-pay | Admitting: *Deleted

## 2014-08-04 NOTE — Telephone Encounter (Signed)
error 

## 2014-08-23 ENCOUNTER — Other Ambulatory Visit: Payer: Self-pay

## 2014-08-23 DIAGNOSIS — Z1231 Encounter for screening mammogram for malignant neoplasm of breast: Secondary | ICD-10-CM

## 2014-09-05 ENCOUNTER — Ambulatory Visit (INDEPENDENT_AMBULATORY_CARE_PROVIDER_SITE_OTHER): Payer: BLUE CROSS/BLUE SHIELD | Admitting: Family Medicine

## 2014-09-05 ENCOUNTER — Encounter: Payer: Self-pay | Admitting: Family Medicine

## 2014-09-05 VITALS — BP 120/74 | HR 61 | Temp 97.7°F | Resp 16 | Ht 62.25 in | Wt 199.0 lb

## 2014-09-05 DIAGNOSIS — E119 Type 2 diabetes mellitus without complications: Secondary | ICD-10-CM | POA: Diagnosis not present

## 2014-09-05 DIAGNOSIS — E669 Obesity, unspecified: Secondary | ICD-10-CM | POA: Diagnosis not present

## 2014-09-05 DIAGNOSIS — E7800 Pure hypercholesterolemia, unspecified: Secondary | ICD-10-CM

## 2014-09-05 DIAGNOSIS — I10 Essential (primary) hypertension: Secondary | ICD-10-CM

## 2014-09-05 LAB — CBC WITH DIFFERENTIAL/PLATELET
BASOS PCT: 1 % (ref 0–1)
Basophils Absolute: 0.1 10*3/uL (ref 0.0–0.1)
EOS ABS: 0.1 10*3/uL (ref 0.0–0.7)
EOS PCT: 2 % (ref 0–5)
HCT: 39.3 % (ref 36.0–46.0)
Hemoglobin: 13.1 g/dL (ref 12.0–15.0)
Lymphocytes Relative: 28 % (ref 12–46)
Lymphs Abs: 1.9 10*3/uL (ref 0.7–4.0)
MCH: 30.8 pg (ref 26.0–34.0)
MCHC: 33.3 g/dL (ref 30.0–36.0)
MCV: 92.5 fL (ref 78.0–100.0)
MONO ABS: 0.5 10*3/uL (ref 0.1–1.0)
MPV: 9.5 fL (ref 8.6–12.4)
Monocytes Relative: 7 % (ref 3–12)
Neutro Abs: 4.2 10*3/uL (ref 1.7–7.7)
Neutrophils Relative %: 62 % (ref 43–77)
Platelets: 305 10*3/uL (ref 150–400)
RBC: 4.25 MIL/uL (ref 3.87–5.11)
RDW: 12.7 % (ref 11.5–15.5)
WBC: 6.7 10*3/uL (ref 4.0–10.5)

## 2014-09-05 LAB — LIPID PANEL
CHOL/HDL RATIO: 3.7 ratio
Cholesterol: 195 mg/dL (ref 0–200)
HDL: 53 mg/dL (ref >=46)
LDL Cholesterol: 119 mg/dL — ABNORMAL HIGH (ref 0–99)
Triglycerides: 115 mg/dL (ref <150)
VLDL: 23 mg/dL (ref 0–40)

## 2014-09-05 LAB — COMPREHENSIVE METABOLIC PANEL
ALBUMIN: 4.3 g/dL (ref 3.5–5.2)
ALT: 15 U/L (ref 0–35)
AST: 15 U/L (ref 0–37)
Alkaline Phosphatase: 58 U/L (ref 39–117)
BUN: 20 mg/dL (ref 6–23)
CALCIUM: 9.4 mg/dL (ref 8.4–10.5)
CO2: 27 meq/L (ref 19–32)
Chloride: 101 mEq/L (ref 96–112)
Creat: 0.84 mg/dL (ref 0.50–1.10)
GLUCOSE: 109 mg/dL — AB (ref 70–99)
POTASSIUM: 4.5 meq/L (ref 3.5–5.3)
Sodium: 139 mEq/L (ref 135–145)
TOTAL PROTEIN: 6.9 g/dL (ref 6.0–8.3)
Total Bilirubin: 0.4 mg/dL (ref 0.2–1.2)

## 2014-09-05 LAB — HEMOGLOBIN A1C
Hgb A1c MFr Bld: 6 % — ABNORMAL HIGH (ref <5.7)
Mean Plasma Glucose: 126 mg/dL — ABNORMAL HIGH (ref <117)

## 2014-09-05 NOTE — Progress Notes (Signed)
Subjective:    Patient ID: Elaine Jackson, female    DOB: 1955-08-18, 59 y.o.   MRN: [id number]  09/05/2014  Medication Refill   HPI This 59 y.o. female presents for three month follow-up:  1. DMII:  Hardly ever takes Metformin; if does take it, it is once per day.  Checking sugars; this morning sugar was 132 but ate brownie.    2.  WUJ:WJXBJYN reports good compliance with medication, good tolerance to medication, and good symptom control.  Not checking BP at home.  Not exercising.     3.  Hyperlipidemia:  Has been off of statin for six months.  Muscle aches have resolved.    4. Obesity:  Weight continues to slowly decreased; s/p lap banding.  5. Family stressors:  Stable at this time.  Grandson in counseling.  Daughter and grandsons moved in with patient; coping well.   Review of Systems  Constitutional: Negative for fever, chills, diaphoresis and fatigue.  Eyes: Negative for visual disturbance.  Respiratory: Negative for cough and shortness of breath.   Cardiovascular: Negative for chest pain, palpitations and leg swelling.  Gastrointestinal: Negative for nausea, vomiting, abdominal pain, diarrhea and constipation.  Endocrine: Negative for cold intolerance, heat intolerance, polydipsia, polyphagia and polyuria.  Neurological: Negative for dizziness, tremors, seizures, syncope, facial asymmetry, speech difficulty, weakness, light-headedness, numbness and headaches.    Past Medical History  Diagnosis Date  . Hypertension   . Diabetes mellitus   . Hyperlipidemia   . Family history of breast cancer in first degree relative     sister age 4  . Sleep related leg cramps   . Allergic rhinitis, cause unspecified   . Obesity, unspecified   . Unspecified vitamin D deficiency   . Other chronic nonalcoholic liver disease   . Chicken pox   . Measles   . Malignant neoplasm skin of face 02/16/2007    Basal Cell Carcinoma  Tafeen.  . Allergy   . Heart murmur    Past Surgical  History  Procedure Laterality Date  . Gallbladder surgery  1994  . Laparoscopic gastric banding  07/03/10    Dr. Sherrin Daisy; [facility]  . Cholecystectomy    . Tonsillectomy    . Colonoscopy  06/25/2007    Hung. Normal. Repeat in ten years.   Allergies  Allergen Reactions  . Sulfur Nausea Only and Other (See Comments)    ALSO VOMITING   Current Outpatient Prescriptions  Medication Sig Dispense Refill  . cholecalciferol (VITAMIN D) 1000 UNITS tablet Take 2,000 Units by mouth daily.    . fluticasone (FLONASE) 50 MCG/ACT nasal spray Place 2 sprays into both nostrils daily. 16 g 11  . glucose blood (FREESTYLE TEST STRIPS) test strip Use as instructed One touch ultra 2 100 each 12  . glucose monitoring kit (FREESTYLE) monitoring kit 1 each by Does not apply route as needed for other. One touch ultra 2 1 each 0  . Iron TABS Take by mouth daily.    . Lancets (ONETOUCH ULTRASOFT) lancets Use as instructed One touch ultra 2 100 each 12  . losartan-hydrochlorothiazide (HYZAAR) 100-25 MG per tablet TAKE 1 TABLET EVERY MORNING FOR BLOOD PRESSURE 30 tablet 2  . metFORMIN (GLUCOPHAGE) 500 MG tablet Take 1 tablet (500 mg total) by mouth 2 (two) times daily with a meal. 180 tablet 3  . montelukast (SINGULAIR) 10 MG tablet Take 1 tablet (10 mg total) by mouth at bedtime. 90 tablet 3  . pseudoephedrine-guaifenesin (MUCINEX D) 60-600  MG per tablet Take 1 tablet by mouth every 12 (twelve) hours. 18 tablet 0  . amoxicillin-clavulanate (AUGMENTIN) 875-125 MG per tablet Take 1 tablet by mouth 2 (two) times daily. (Patient not taking: Reported on 09/05/2014) 20 tablet 0  . atorvastatin (LIPITOR) 10 MG tablet Take 1 tablet (10 mg total) by mouth daily at 6 PM. 90 tablet 1  . chlorpheniramine-HYDROcodone (TUSSIONEX PENNKINETIC ER) 10-8 MG/5ML LQCR Take 5 mLs by mouth every 12 (twelve) hours as needed. (Patient not taking: Reported on 09/05/2014) 120 mL 0  . predniSONE (DELTASONE) 20 MG tablet Two tablets daily x 5  days then one tablet daily x 5 days (Patient not taking: Reported on 09/05/2014) 15 tablet 0  . simvastatin (ZOCOR) 20 MG tablet Take 1 tablet (20 mg total) by mouth at bedtime. (Patient not taking: Reported on 05/20/2014) 90 tablet 3  . zoster vaccine live, PF, (ZOSTAVAX) 49449 UNT/0.65ML injection Inject 19,400 Units into the skin once. (Patient not taking: Reported on 09/05/2014) 0.65 mL 0   No current facility-administered medications for this visit.       Objective:    BP 120/74 mmHg  Pulse 61  Temp(Src) 97.7 F (36.5 C) (Oral)  Resp 16  Ht 5' 2.25" (1.581 m)  Wt 199 lb (90.266 kg)  BMI 36.11 kg/m2  SpO2 98% Physical Exam  Constitutional: She is oriented to person, place, and time. She appears well-developed and well-nourished. No distress.  HENT:  Head: Normocephalic and atraumatic.  Right Ear: External ear normal.  Left Ear: External ear normal.  Nose: Nose normal.  Mouth/Throat: Oropharynx is clear and moist.  Eyes: Conjunctivae and EOM are normal. Pupils are equal, round, and reactive to light.  Neck: Normal range of motion. Neck supple. Carotid bruit is not present. No thyromegaly present.  Cardiovascular: Normal rate, regular rhythm, normal heart sounds and intact distal pulses.  Exam reveals no gallop and no friction rub.   No murmur heard. Pulmonary/Chest: Effort normal and breath sounds normal. She has no wheezes. She has no rales.  Abdominal: Soft. Bowel sounds are normal. She exhibits no distension and no mass. There is no tenderness. There is no rebound and no guarding.  Lymphadenopathy:    She has no cervical adenopathy.  Neurological: She is alert and oriented to person, place, and time. No cranial nerve deficit.  Skin: Skin is warm and dry. No rash noted. She is not diaphoretic. No erythema. No pallor.  Psychiatric: She has a normal mood and affect. Her behavior is normal.        Assessment & Plan:   1. Pure hypercholesterolemia   2. Essential  hypertension, benign   3. Type 2 diabetes mellitus without complication   4. Obesity (BMI 30-39.9)     1.  Hypercholesterolemia: stable; has been holding statin for six months; repeat labs. 2. HTN: controlled; obtain labs; continue current medications. 3. DMII: controlled; obtain labs; taking Metformin sparingly at this time. 4.  Obesity: continues to slowly decrease weight; s/p lap banding with success.   Return for complete physical examiniation.     Davion Flannery Elayne Guerin, M.D. [facility] [address] [place], [place]  [address] [phone] phone [phone] fax

## 2014-09-05 NOTE — Patient Instructions (Signed)

## 2014-09-06 MED ORDER — ATORVASTATIN CALCIUM 10 MG PO TABS: 10.0000 mg | ORAL_TABLET | Freq: Every day | ORAL | Status: DC

## 2014-09-26 ENCOUNTER — Ambulatory Visit: Payer: No Typology Code available for payment source

## 2014-09-27 ENCOUNTER — Ambulatory Visit
Admission: RE | Admit: 2014-09-27 | Discharge: 2014-09-27 | Disposition: A | Discharge status: Home / Self Care | Payer: BLUE CROSS/BLUE SHIELD | Source: Ambulatory Visit

## 2014-09-27 DIAGNOSIS — Z1231 Encounter for screening mammogram for malignant neoplasm of breast: Secondary | ICD-10-CM

## 2014-11-28 ENCOUNTER — Other Ambulatory Visit: Payer: Self-pay | Admitting: Family Medicine

## 2014-12-12 ENCOUNTER — Encounter: Payer: Self-pay | Admitting: Family Medicine

## 2014-12-12 ENCOUNTER — Ambulatory Visit (INDEPENDENT_AMBULATORY_CARE_PROVIDER_SITE_OTHER): Payer: BLUE CROSS/BLUE SHIELD | Admitting: Family Medicine

## 2014-12-12 VITALS — BP 110/70 | HR 55 | Temp 97.7°F | Resp 16 | Ht 62.0 in | Wt 198.4 lb

## 2014-12-12 DIAGNOSIS — E7800 Pure hypercholesterolemia, unspecified: Secondary | ICD-10-CM

## 2014-12-12 DIAGNOSIS — E119 Type 2 diabetes mellitus without complications: Secondary | ICD-10-CM | POA: Diagnosis not present

## 2014-12-12 DIAGNOSIS — I10 Essential (primary) hypertension: Secondary | ICD-10-CM

## 2014-12-12 DIAGNOSIS — Z0000 Encounter for general adult medical examination without abnormal findings: Secondary | ICD-10-CM

## 2014-12-12 DIAGNOSIS — Z6836 Body mass index (BMI) 36.0-36.9, adult: Secondary | ICD-10-CM | POA: Diagnosis not present

## 2014-12-12 DIAGNOSIS — J301 Allergic rhinitis due to pollen: Secondary | ICD-10-CM | POA: Diagnosis not present

## 2014-12-12 DIAGNOSIS — E669 Obesity, unspecified: Secondary | ICD-10-CM

## 2014-12-12 DIAGNOSIS — J069 Acute upper respiratory infection, unspecified: Secondary | ICD-10-CM

## 2014-12-12 LAB — CBC WITH DIFFERENTIAL/PLATELET
Basophils Absolute: 0 10*3/uL (ref 0.0–0.1)
Basophils Relative: 0 % (ref 0–1)
Eosinophils Absolute: 0.1 10*3/uL (ref 0.0–0.7)
Eosinophils Relative: 1 % (ref 0–5)
HEMATOCRIT: 36.3 % (ref 36.0–46.0)
HEMOGLOBIN: 12.3 g/dL (ref 12.0–15.0)
LYMPHS ABS: 2 10*3/uL (ref 0.7–4.0)
LYMPHS PCT: 32 % (ref 12–46)
MCH: 30.8 pg (ref 26.0–34.0)
MCHC: 33.9 g/dL (ref 30.0–36.0)
MCV: 90.8 fL (ref 78.0–100.0)
MPV: 9.9 fL (ref 8.6–12.4)
Monocytes Absolute: 0.4 10*3/uL (ref 0.1–1.0)
Monocytes Relative: 6 % (ref 3–12)
NEUTROS PCT: 61 % (ref 43–77)
Neutro Abs: 3.7 10*3/uL (ref 1.7–7.7)
Platelets: 267 10*3/uL (ref 150–400)
RBC: 4 MIL/uL (ref 3.87–5.11)
RDW: 13.2 % (ref 11.5–15.5)
WBC: 6.1 10*3/uL (ref 4.0–10.5)

## 2014-12-12 LAB — POCT URINALYSIS DIPSTICK
Bilirubin, UA: NEGATIVE
Blood, UA: NEGATIVE
GLUCOSE UA: NEGATIVE
Ketones, UA: NEGATIVE
NITRITE UA: NEGATIVE
Protein, UA: NEGATIVE
Spec Grav, UA: 1.01
Urobilinogen, UA: 0.2
pH, UA: 5.5

## 2014-12-12 LAB — HEMOGLOBIN A1C
Hgb A1c MFr Bld: 6 % — ABNORMAL HIGH (ref <5.7)
MEAN PLASMA GLUCOSE: 126 mg/dL — AB (ref <117)

## 2014-12-12 LAB — COMPREHENSIVE METABOLIC PANEL
ALBUMIN: 4.6 g/dL (ref 3.5–5.2)
ALT: 13 U/L (ref 0–35)
AST: 14 U/L (ref 0–37)
Alkaline Phosphatase: 54 U/L (ref 39–117)
BILIRUBIN TOTAL: 0.9 mg/dL (ref 0.2–1.2)
BUN: 17 mg/dL (ref 6–23)
CO2: 26 mEq/L (ref 19–32)
CREATININE: 0.84 mg/dL (ref 0.50–1.10)
Calcium: 9.6 mg/dL (ref 8.4–10.5)
Chloride: 102 mEq/L (ref 96–112)
GLUCOSE: 97 mg/dL (ref 70–99)
Potassium: 4.1 mEq/L (ref 3.5–5.3)
SODIUM: 139 meq/L (ref 135–145)
TOTAL PROTEIN: 6.8 g/dL (ref 6.0–8.3)

## 2014-12-12 LAB — TSH: TSH: 0.515 u[IU]/mL (ref 0.350–4.500)

## 2014-12-12 LAB — LIPID PANEL
CHOLESTEROL: 165 mg/dL (ref 0–200)
HDL: 47 mg/dL (ref >=46)
LDL CALC: 94 mg/dL (ref 0–99)
TRIGLYCERIDES: 121 mg/dL (ref <150)
Total CHOL/HDL Ratio: 3.5 Ratio
VLDL: 24 mg/dL (ref 0–40)

## 2014-12-12 NOTE — Progress Notes (Signed)
   Subjective:    Patient ID: Elaine Jackson, female    DOB: 08/22/1955, 59 y.o.   MRN: [id number]  HPI    Review of Systems  Constitutional: Negative.   HENT: Negative.   Eyes: Negative.   Respiratory: Negative.   Cardiovascular: Negative.   Gastrointestinal: Negative.   Endocrine: Negative.   Genitourinary: Negative.   Musculoskeletal: Positive for arthralgias.  Skin: Negative.   Allergic/Immunologic: Positive for environmental allergies.  Neurological: Negative.   Hematological: Negative.   Psychiatric/Behavioral: Negative.        Objective:   Physical Exam        Assessment & Plan:

## 2014-12-12 NOTE — Patient Instructions (Addendum)
1.  Call for dermatology appointment.  Keeping You Healthy  Get These Tests  Blood Pressure- Have your blood pressure checked by your healthcare provider at least once a year.  Normal blood pressure is 120/80.  Weight- Have your body mass index (BMI) calculated to screen for obesity.  BMI is a measure of body fat based on height and weight.  You can calculate your own BMI at [employer].it  Cholesterol- Have your cholesterol checked every year.  Diabetes- Have your blood sugar checked every year if you have high blood pressure, high cholesterol, a family history of diabetes or if you are overweight.  Pap Test - Have a pap test every 1 to 5 years if you have been sexually active.  If you are older than 65 and recent pap tests have been normal you may not need additional pap tests.  In addition, if you have had a hysterectomy  for benign disease additional pap tests are not necessary.  Mammogram-Yearly mammograms are essential for early detection of breast cancer  Screening for Colon Cancer- Colonoscopy starting at age 57. Screening may begin sooner depending on your family history and other health conditions.  Follow up colonoscopy as directed by your Gastroenterologist.  Screening for Osteoporosis- Screening begins at age 54 with bone density scanning, sooner if you are at higher risk for developing Osteoporosis.  Get these medicines  Calcium with Vitamin D- Your body requires 1200-1500 mg of Calcium a day and [phone] IU of Vitamin D a day.  You can only absorb 500 mg of Calcium at a time therefore Calcium must be taken in 2 or 3 separate doses throughout the day.  Hormones- Hormone therapy has been associated with increased risk for certain cancers and heart disease.  Talk to your healthcare provider about if you need relief from menopausal symptoms.  Aspirin- Ask your healthcare provider about taking Aspirin to prevent Heart Disease and Stroke.  Get these  Immuniztions  Flu shot- Every fall  Pneumonia shot- Once after the age of 25; if you are younger ask your healthcare provider if you need a pneumonia shot.  Tetanus- Every ten years.  Zostavax- Once after the age of 22 to prevent shingles.  Take these steps  Don't smoke- Your healthcare provider can help you quit. For tips on how to quit, ask your healthcare provider or go to www.smokefree.gov or call 1-800 QUIT-NOW.  Be physically active- Exercise 5 days a week for a minimum of 30 minutes.  If you are not already physically active, start slow and gradually work up to 30 minutes of moderate physical activity.  Try walking, dancing, bike riding, swimming, etc.  Eat a healthy diet- Eat a variety of healthy foods such as fruits, vegetables, whole grains, low fat milk, low fat cheeses, yogurt, lean meats, chicken, fish, eggs, dried beans, tofu, etc.  For more information go to www.thenutritionsource.org  Dental visit- Brush and floss teeth twice daily; visit your dentist twice a year.  Eye exam- Visit your Optometrist or Ophthalmologist yearly.  Drink alcohol in moderation- Limit alcohol intake to one drink or less a day.  Never drink and drive.  Depression- Your emotional health is as important as your physical health.  If you're feeling down or losing interest in things you normally enjoy, please talk to your healthcare provider.  Seat Belts- can save your life; always wear one  Smoke/Carbon Monoxide detectors- These detectors need to be installed on the appropriate level of your home.  Replace batteries at  least once a year.  Violence- If anyone is threatening or hurting you, please tell your healthcare provider.  Living Will/ Health care power of attorney- Discuss with your healthcare provider and family.

## 2014-12-12 NOTE — Progress Notes (Signed)
Subjective:    Patient ID: Elaine Jackson, female    DOB: [birth year], 59 y.o.   MRN: [id number]  12/12/2014  Annual Exam   HPI This 59 y.o. female presents for Complete Physical Examination.  Last physical:  11-03-13 Pap smear: 11-03-13 WNL; no HPV obtained.  09/14/2012 WNL HPV negative. Mammogram:  09-27-14 Colonoscopy:06-25-2007; repeat in 10 years.  Hung. TDAP:  2013 Pneumovax:   2010; 2015 Zostavax: never; rx provided. Influenza:  02/2014 Eye exam:  Less than one year ago.  No diabetic retinopathy.  No glaucoma or cataracts. Dental exam:  Every six months.  Last month.    Review of Systems  Constitutional: Negative for fever, chills, diaphoresis, activity change, appetite change, fatigue and unexpected weight change.  HENT: Negative for congestion, dental problem, drooling, ear discharge, ear pain, facial swelling, hearing loss, mouth sores, nosebleeds, postnasal drip, rhinorrhea, sinus pressure, sneezing, sore throat, tinnitus, trouble swallowing and voice change.   Eyes: Negative for photophobia, pain, discharge, redness, itching and visual disturbance.  Respiratory: Negative for apnea, cough, choking, chest tightness, shortness of breath, wheezing and stridor.   Cardiovascular: Negative for chest pain, palpitations and leg swelling.  Gastrointestinal: Negative for nausea, vomiting, abdominal pain, diarrhea, constipation, blood in stool, abdominal distention, anal bleeding and rectal pain.  Endocrine: Negative for cold intolerance, heat intolerance, polydipsia, polyphagia and polyuria.  Genitourinary: Negative for dysuria, urgency, frequency, hematuria, flank pain, decreased urine volume, vaginal bleeding, vaginal discharge, enuresis, difficulty urinating, genital sores, vaginal pain, menstrual problem, pelvic pain and dyspareunia.  Musculoskeletal: Positive for arthralgias. Negative for myalgias, back pain, joint swelling, gait problem, neck pain and neck stiffness.  Skin: Negative  for color change, pallor, rash and wound.  Allergic/Immunologic: Positive for environmental allergies. Negative for food allergies and immunocompromised state.  Neurological: Negative for dizziness, tremors, seizures, syncope, facial asymmetry, speech difficulty, weakness, light-headedness, numbness and headaches.  Hematological: Negative for adenopathy. Does not bruise/bleed easily.  Psychiatric/Behavioral: Negative for suicidal ideas, hallucinations, behavioral problems, confusion, sleep disturbance, self-injury, dysphoric mood, decreased concentration and agitation. The patient is not nervous/anxious and is not hyperactive.     Past Medical History  Diagnosis Date  . Hypertension   . Diabetes mellitus   . Hyperlipidemia   . Family history of breast cancer in first degree relative     sister age 2  . Sleep related leg cramps   . Allergic rhinitis, cause unspecified   . Obesity, unspecified   . Unspecified vitamin D deficiency   . Other chronic nonalcoholic liver disease   . Chicken pox   . Measles   . Malignant neoplasm skin of face 02/16/2007    Basal Cell Carcinoma  Tafeen.  . Allergy   . Heart murmur    Past Surgical History  Procedure Laterality Date  . Laparoscopic gastric banding  07/03/10    Dr. Sherrin Daisy; [facility]  . Cholecystectomy    . Tonsillectomy    . Colonoscopy  06/25/2007    Hung. Normal. Repeat in ten years.   Allergies  Allergen Reactions  . Sulfur Nausea Only and Other (See Comments)    ALSO VOMITING   Current Outpatient Prescriptions  Medication Sig Dispense Refill  . atorvastatin (LIPITOR) 10 MG tablet Take 1 tablet (10 mg total) by mouth daily at 6 PM. 90 tablet 1  . cholecalciferol (VITAMIN D) 1000 UNITS tablet Take 2,000 Units by mouth daily.    . fluticasone (FLONASE) 50 MCG/ACT nasal spray Place 2 sprays into both nostrils daily. [place]  g 11  . glucose blood (FREESTYLE TEST STRIPS) test strip Use as instructed One touch ultra 2 100 each 12  .  glucose monitoring kit (FREESTYLE) monitoring kit 1 each by Does not apply route as needed for other. One touch ultra 2 1 each 0  . Iron TABS Take by mouth daily.    . Lancets (ONETOUCH ULTRASOFT) lancets Use as instructed One touch ultra 2 100 each 12  . losartan-hydrochlorothiazide (HYZAAR) 100-25 MG per tablet TAKE 1 TABLET EVERY MORNING FOR BLOOD PRESSURE 30 tablet 2  . metFORMIN (GLUCOPHAGE) 500 MG tablet Take 1 tablet (500 mg total) by mouth 2 (two) times daily with a meal. 180 tablet 3  . montelukast (SINGULAIR) 10 MG tablet TAKE 1 TABLET (10 MG TOTAL) BY MOUTH AT BEDTIME. 90 tablet 0  . zoster vaccine live, PF, (ZOSTAVAX) 74163 UNT/0.65ML injection Inject 19,400 Units into the skin once. (Patient not taking: Reported on 09/05/2014) 0.65 mL 0   No current facility-administered medications for this visit.   History   Social History  . Marital Status: Married    Spouse Name: N/A  . Number of Children: 1  . Years of Education: N/A   Occupational History  . HAIR STYLIST    Social History Main Topics  . Smoking status: Former Smoker -- 1.00 packs/day for 25 years    Types: Cigarettes  . Smokeless tobacco: Never Used     Comment: quit age 27     smoked from 52 to 42 off and on  . Alcohol Use: No  . Drug Use: No  . Sexual Activity: Yes    Birth Control/ Protection: Post-menopausal   Other Topics Concern  . Not on file   Social History Narrative   Marital status:  Married; x 25 years. Second marriage. Happily married; no abuse      Children: one child, one stepson;two grandsons      Lives: with husband, daughter, 2 grandsons.      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      Tobacco:  None      Alcohol: rare/minimal.      Drugs:  None       Exercise: none       Seatbelt:  100%.       Sunscreen: SPF 15.      Guns:  Maybe?   Family History  Problem Relation Age of Onset  . Diabetes Mother   . Heart disease Mother     CHF with  defibrillator; carotid artery stenosis.  . Hypertension Mother   . Hyperlipidemia Mother   . Dementia Mother   . Heart disease Father   . Kidney disease Father     renal failure ESRD/peritoneal dialysis  . Diabetes Father   . Hypertension Father   . Hyperlipidemia Father   . Stroke Father   . Diabetes Sister   . Hypertension Sister   . Kidney disease Sister     renal failure  . Diabetes Brother   . Hypertension Brother   . Hyperlipidemia Brother   . Arthritis Brother   . Cancer Sister     cervical cancer  . Hypertension Sister   . Cancer Sister 74    Liver cancer from hepatitis C.  . Stroke Maternal Grandmother   . Heart disease Maternal Grandfather   . Stroke Paternal Grandmother   . Heart disease Paternal Grandmother   . Heart disease Paternal Grandfather   . Stroke  Paternal Grandfather         Objective:    BP 110/70 mmHg  Pulse 55  Temp(Src) 97.7 F (36.5 C) (Oral)  Resp 16  Ht $R'5\' 2"'Si$  (1.575 m)  Wt 198 lb 6.4 oz (89.994 kg)  BMI 36.28 kg/m2  SpO2 98% Physical Exam  Constitutional: She is oriented to person, place, and time. She appears well-developed and well-nourished. No distress.  obese  HENT:  Head: Normocephalic and atraumatic.  Right Ear: External ear normal.  Left Ear: External ear normal.  Nose: Nose normal.  Mouth/Throat: Oropharynx is clear and moist.  Eyes: Conjunctivae and EOM are normal. Pupils are equal, round, and reactive to light.  Neck: Normal range of motion and full passive range of motion without pain. Neck supple. No JVD present. Carotid bruit is not present. No thyromegaly present.  Cardiovascular: Regular rhythm and intact distal pulses.  Bradycardia present.  Exam reveals no gallop and no friction rub.   Murmur heard.  Systolic murmur is present with a grade of 2/6  Pulmonary/Chest: Effort normal and breath sounds normal. She has no wheezes. She has no rales.  Abdominal: Soft. Bowel sounds are normal. She exhibits no distension  and no mass. There is no tenderness. There is no rebound and no guarding.  Genitourinary: Vagina normal and uterus normal.    There is no rash, tenderness or lesion on the right labia. There is no rash, tenderness or lesion on the left labia. Cervix exhibits no motion tenderness, no discharge and no friability. Right adnexum displays no mass, no tenderness and no fullness. Left adnexum displays no mass, no tenderness and no fullness. No erythema, tenderness or bleeding in the vagina. No foreign body around the vagina. No signs of injury around the vagina. No vaginal discharge found.  Subcutaneous mobile cystic lesion L medial thigh 64mm diameter.   Musculoskeletal:       Right shoulder: Normal.       Left shoulder: Normal.       Cervical back: Normal.  Lymphadenopathy:    She has no cervical adenopathy.  Neurological: She is alert and oriented to person, place, and time. She has normal reflexes. No cranial nerve deficit. She exhibits normal muscle tone. Coordination normal.  Skin: Skin is warm and dry. No rash noted. She is not diaphoretic. No erythema. No pallor.  Diffuse sun related changes along torso.  Psychiatric: She has a normal mood and affect. Her behavior is normal. Judgment and thought content normal.  Nursing note and vitals reviewed.  EKG: sinus bradycardia at 53.     Assessment & Plan:   1. Routine physical examination   2. Type 2 diabetes mellitus without complication   3. Pure hypercholesterolemia   4. Essential hypertension   5. Obesity   6. BMI 36.0-36.9,adult     1. Complete Physical Examination: anticipatory guidance --- exercise, weight loss, 3 servings of calcium daily, ASA $RemoveBe'81mg'GzntYxBGh$  daily.  Pap smear UTD in 2014.  Mammogram and colonoscopy UTD. Immunizations UTD. 2.  DMII: controlled; obtain labs with urine microalbumin; eye exam UTD.  No changes in management. 3.  Hypercholesterolemia: stable; obtain labs; refill provided. 4.  HTN: controlled; obtain labs; continue  current medications. 5.  Obesity/BMI 36: recommend ongoing weight loss and exercise.d 6. Heart murmur: consider repeat echo at next visit.  No orders of the defined types were placed in this encounter.    Return in about 4 months (around 04/13/2015) for recheck diabetes, high blood pressure.  Elaine Jackson Elayne Guerin, M.D. [facility] [address] [place], [place]  [address] [phone] phone [phone] fax

## 2014-12-13 LAB — MICROALBUMIN, URINE: MICROALB UR: 0.3 mg/dL (ref <2.0)

## 2014-12-13 MED ORDER — LOSARTAN POTASSIUM-HCTZ 100-25 MG PO TABS: 1.0000 | ORAL_TABLET | Freq: Every day | ORAL | Status: DC

## 2014-12-13 MED ORDER — MONTELUKAST SODIUM 10 MG PO TABS: ORAL_TABLET | ORAL | Status: DC

## 2014-12-13 MED ORDER — METFORMIN HCL 500 MG PO TABS: 500.0000 mg | ORAL_TABLET | Freq: Every day | ORAL | Status: DC

## 2014-12-13 MED ORDER — FLUTICASONE PROPIONATE 50 MCG/ACT NA SUSP: 2.0000 | Freq: Every day | NASAL | Status: DC

## 2014-12-13 MED ORDER — ATORVASTATIN CALCIUM 10 MG PO TABS: 10.0000 mg | ORAL_TABLET | Freq: Every day | ORAL | Status: DC

## 2014-12-19 ENCOUNTER — Ambulatory Visit (INDEPENDENT_AMBULATORY_CARE_PROVIDER_SITE_OTHER): Payer: BLUE CROSS/BLUE SHIELD | Admitting: Emergency Medicine

## 2014-12-19 ENCOUNTER — Emergency Department (HOSPITAL_COMMUNITY): Payer: BLUE CROSS/BLUE SHIELD

## 2014-12-19 ENCOUNTER — Encounter (HOSPITAL_COMMUNITY): Payer: Self-pay

## 2014-12-19 ENCOUNTER — Inpatient Hospital Stay (HOSPITAL_COMMUNITY)
Admission: EM | Admit: 2014-12-19 | Discharge: 2014-12-21 | DRG: 282 | Disposition: A | Discharge status: Home / Self Care | Payer: BLUE CROSS/BLUE SHIELD | Attending: Cardiology | Admitting: Cardiology

## 2014-12-19 VITALS — BP 154/62 | HR 66 | Temp 98.7°F | Resp 18 | Ht 62.0 in | Wt 202.0 lb

## 2014-12-19 DIAGNOSIS — E119 Type 2 diabetes mellitus without complications: Secondary | ICD-10-CM | POA: Diagnosis present

## 2014-12-19 DIAGNOSIS — E669 Obesity, unspecified: Secondary | ICD-10-CM | POA: Diagnosis present

## 2014-12-19 DIAGNOSIS — K769 Liver disease, unspecified: Secondary | ICD-10-CM | POA: Diagnosis present

## 2014-12-19 DIAGNOSIS — R079 Chest pain, unspecified: Secondary | ICD-10-CM

## 2014-12-19 DIAGNOSIS — R001 Bradycardia, unspecified: Secondary | ICD-10-CM | POA: Diagnosis present

## 2014-12-19 DIAGNOSIS — G43909 Migraine, unspecified, not intractable, without status migrainosus: Secondary | ICD-10-CM | POA: Diagnosis present

## 2014-12-19 DIAGNOSIS — Z87891 Personal history of nicotine dependence: Secondary | ICD-10-CM

## 2014-12-19 DIAGNOSIS — Z8249 Family history of ischemic heart disease and other diseases of the circulatory system: Secondary | ICD-10-CM | POA: Diagnosis not present

## 2014-12-19 DIAGNOSIS — E7800 Pure hypercholesterolemia, unspecified: Secondary | ICD-10-CM

## 2014-12-19 DIAGNOSIS — I10 Essential (primary) hypertension: Secondary | ICD-10-CM | POA: Diagnosis present

## 2014-12-19 DIAGNOSIS — I314 Cardiac tamponade: Secondary | ICD-10-CM | POA: Diagnosis not present

## 2014-12-19 DIAGNOSIS — R7989 Other specified abnormal findings of blood chemistry: Secondary | ICD-10-CM

## 2014-12-19 DIAGNOSIS — E785 Hyperlipidemia, unspecified: Secondary | ICD-10-CM | POA: Diagnosis present

## 2014-12-19 DIAGNOSIS — I2510 Atherosclerotic heart disease of native coronary artery without angina pectoris: Secondary | ICD-10-CM | POA: Diagnosis present

## 2014-12-19 DIAGNOSIS — Z6837 Body mass index (BMI) 37.0-37.9, adult: Secondary | ICD-10-CM | POA: Diagnosis not present

## 2014-12-19 DIAGNOSIS — I214 Non-ST elevation (NSTEMI) myocardial infarction: Secondary | ICD-10-CM | POA: Diagnosis not present

## 2014-12-19 DIAGNOSIS — R51 Headache: Secondary | ICD-10-CM | POA: Diagnosis present

## 2014-12-19 DIAGNOSIS — Z7982 Long term (current) use of aspirin: Secondary | ICD-10-CM

## 2014-12-19 DIAGNOSIS — R9431 Abnormal electrocardiogram [ECG] [EKG]: Secondary | ICD-10-CM

## 2014-12-19 HISTORY — DX: Atherosclerotic heart disease of native coronary artery without angina pectoris: I25.10

## 2014-12-19 LAB — CBC
HCT: 34.5 % — ABNORMAL LOW (ref 36.0–46.0)
HEMOGLOBIN: 11.4 g/dL — AB (ref 12.0–15.0)
MCH: 30.4 pg (ref 26.0–34.0)
MCHC: 33 g/dL (ref 30.0–36.0)
MCV: 92 fL (ref 78.0–100.0)
Platelets: 252 10*3/uL (ref 150–400)
RBC: 3.75 MIL/uL — ABNORMAL LOW (ref 3.87–5.11)
RDW: 12.6 % (ref 11.5–15.5)
WBC: 11.2 10*3/uL — AB (ref 4.0–10.5)

## 2014-12-19 LAB — BASIC METABOLIC PANEL
ANION GAP: 8 (ref 5–15)
BUN: 13 mg/dL (ref 6–20)
CALCIUM: 9.1 mg/dL (ref 8.9–10.3)
CHLORIDE: 107 mmol/L (ref 101–111)
CO2: 25 mmol/L (ref 22–32)
CREATININE: 0.83 mg/dL (ref 0.44–1.00)
GFR calc Af Amer: >60 mL/min (ref >60)
GFR calc non Af Amer: >60 mL/min (ref >60)
Glucose, Bld: 129 mg/dL — ABNORMAL HIGH (ref 65–99)
POTASSIUM: 4.4 mmol/L (ref 3.5–5.1)
Sodium: 140 mmol/L (ref 135–145)

## 2014-12-19 LAB — I-STAT TROPONIN, ED: Troponin i, poc: 0.7 ng/mL (ref 0.00–0.08)

## 2014-12-19 LAB — GLUCOSE, CAPILLARY: Glucose-Capillary: 108 mg/dL — ABNORMAL HIGH (ref 65–99)

## 2014-12-19 LAB — BRAIN NATRIURETIC PEPTIDE: B Natriuretic Peptide: 155.1 pg/mL — ABNORMAL HIGH (ref 0.0–100.0)

## 2014-12-19 LAB — PROTIME-INR
INR: 1.06 (ref 0.00–1.49)
Prothrombin Time: 14 seconds (ref 11.6–15.2)

## 2014-12-19 MED ORDER — NITROGLYCERIN 0.4 MG SL SUBL: 0.4000 mg | SUBLINGUAL_TABLET | SUBLINGUAL | Status: DC | PRN

## 2014-12-19 MED ORDER — ACETAMINOPHEN 325 MG PO TABS
650.0000 mg | ORAL_TABLET | ORAL | Status: DC | PRN
  Administered 2014-12-20 (×4): 650 mg via ORAL
  Filled 2014-12-19 (×4): qty 2

## 2014-12-19 MED ORDER — ASPIRIN EC 81 MG PO TBEC
81.0000 mg | DELAYED_RELEASE_TABLET | Freq: Every day | ORAL | Status: DC
  Administered 2014-12-20 – 2014-12-21 (×2): 81 mg via ORAL
  Filled 2014-12-19 (×2): qty 1

## 2014-12-19 MED ORDER — HEPARIN (PORCINE) IN NACL 100-0.45 UNIT/ML-% IJ SOLN
1100.0000 [IU]/h | INTRAMUSCULAR | Status: DC
  Administered 2014-12-19: 900 [IU]/h via INTRAVENOUS
  Filled 2014-12-19: qty 250

## 2014-12-19 MED ORDER — HEPARIN BOLUS VIA INFUSION
3500.0000 [IU] | Freq: Once | INTRAVENOUS | Status: AC
  Administered 2014-12-19: 3500 [IU] via INTRAVENOUS
  Filled 2014-12-19: qty 3500

## 2014-12-19 MED ORDER — ASPIRIN 81 MG PO CHEW: 324.0000 mg | CHEWABLE_TABLET | ORAL | Status: AC

## 2014-12-19 MED ORDER — ACETAMINOPHEN 500 MG PO TABS
1000.0000 mg | ORAL_TABLET | Freq: Once | ORAL | Status: AC
  Administered 2014-12-19: 1000 mg via ORAL
  Filled 2014-12-19: qty 2

## 2014-12-19 MED ORDER — ASPIRIN 300 MG RE SUPP: 300.0000 mg | RECTAL | Status: AC

## 2014-12-19 MED ORDER — MORPHINE SULFATE 2 MG/ML IJ SOLN: 2.0000 mg | INTRAMUSCULAR | Status: DC | PRN

## 2014-12-19 MED ORDER — ATORVASTATIN CALCIUM 80 MG PO TABS
80.0000 mg | ORAL_TABLET | Freq: Every day | ORAL | Status: DC
  Administered 2014-12-19 – 2014-12-20 (×2): 80 mg via ORAL
  Filled 2014-12-19 (×2): qty 1

## 2014-12-19 MED ORDER — ONDANSETRON HCL 4 MG/2ML IJ SOLN: 4.0000 mg | Freq: Four times a day (QID) | INTRAMUSCULAR | Status: DC | PRN

## 2014-12-19 MED ORDER — NITROGLYCERIN IN D5W 200-5 MCG/ML-% IV SOLN
0.0000 ug/min | INTRAVENOUS | Status: DC
  Administered 2014-12-19: 5 ug/min via INTRAVENOUS
  Filled 2014-12-19: qty 250

## 2014-12-19 NOTE — ED Notes (Signed)
Holding heparin until after CT scan

## 2014-12-19 NOTE — ED Provider Notes (Addendum)
CSN: [id number]     Arrival date & time 12/19/14  1923 History   First MD Initiated Contact with Patient 12/19/14 1930     Chief Complaint  Patient presents with  . Abnormal ECG  . Headache     (Consider location/radiation/quality/duration/timing/severity/associated sxs/prior Treatment) HPI  59 year old female presents with headache, chest pain that feels like a "dry chest", diffuse jaw/tooth pain, and dizziness. This all started around 11:30 AM. Patient states it has improved significantly and is barely present. She still does have a headache however. No more chest sensation. Does have a mild toothache-like feeling. Sent from urgent care for concerning EKG. No prior CAD history. Does not she took her BP when this started and systolic was 025, does not remember diastolic. HA is posterior at this time.   Past Medical History  Diagnosis Date  . Hypertension   . Diabetes mellitus   . Hyperlipidemia   . Family history of breast cancer in first degree relative     sister age 62  . Sleep related leg cramps   . Allergic rhinitis, cause unspecified   . Obesity, unspecified   . Unspecified vitamin D deficiency   . Other chronic nonalcoholic liver disease   . Chicken pox   . Measles   . Malignant neoplasm skin of face 02/16/2007    Basal Cell Carcinoma  [place].  . Allergy   . Heart murmur    Past Surgical History  Procedure Laterality Date  . Laparoscopic gastric banding  07/03/10    Dr. Sherrin Daisy; [facility]  . Cholecystectomy    . Tonsillectomy    . Colonoscopy  06/25/2007    [place]. Normal. Repeat in ten years.   Family History  Problem Relation Age of Onset  . Diabetes Mother   . Heart disease Mother     CHF with defibrillator; carotid artery stenosis.  . Hypertension Mother   . Hyperlipidemia Mother   . Dementia Mother   . Heart disease Father   . Kidney disease Father     renal failure ESRD/peritoneal dialysis  . Diabetes Father   . Hypertension Father   .  Hyperlipidemia Father   . Stroke Father   . Diabetes Sister   . Hypertension Sister   . Kidney disease Sister     renal failure  . Diabetes Brother   . Hypertension Brother   . Hyperlipidemia Brother   . Arthritis Brother   . Cancer Sister     cervical cancer  . Hypertension Sister   . Cancer Sister 63    Liver cancer from hepatitis C.  . Stroke Maternal Grandmother   . Heart disease Maternal Grandfather   . Stroke Paternal Grandmother   . Heart disease Paternal Grandmother   . Heart disease Paternal Grandfather   . Stroke Paternal Grandfather    History  Substance Use Topics  . Smoking status: Former Smoker -- 1.00 packs/day for 25 years    Types: Cigarettes  . Smokeless tobacco: Never Used     Comment: quit age 31     smoked from 7 to 6 off and on  . Alcohol Use: No   OB History    No data available     Review of Systems  Constitutional: Negative for fever.  HENT:       Jaw pain  Respiratory: Negative for shortness of breath.   Cardiovascular: Positive for chest pain.  Gastrointestinal: Positive for nausea. Negative for vomiting and abdominal pain.  Neurological: Positive for dizziness  and headaches. Negative for weakness and numbness.  All other systems reviewed and are negative.     Allergies  Sulfur  Home Medications   Prior to Admission medications   Medication Sig Start Date End Date Taking? Authorizing Provider  atorvastatin (LIPITOR) 10 MG tablet Take 1 tablet (10 mg total) by mouth daily at 6 PM. 12/13/14   [name], MD  cholecalciferol (VITAMIN D) 1000 UNITS tablet Take 2,000 Units by mouth daily.    Historical Provider, MD  fluticasone (FLONASE) 50 MCG/ACT nasal spray Place 2 sprays into both nostrils daily. 12/13/14   [name], MD  glucose blood (FREESTYLE TEST STRIPS) test strip Use as instructed One touch ultra 2 04/03/14   [name], MD  glucose monitoring kit (FREESTYLE) monitoring kit 1 each by Does not apply route as  needed for other. One touch ultra 2 04/03/14   [name], MD  Iron TABS Take by mouth daily.    Historical Provider, MD  Lancets [facility] ULTRASOFT) lancets Use as instructed One touch ultra 2 04/03/14   [name], MD  losartan-hydrochlorothiazide (HYZAAR) 100-25 MG per tablet Take 1 tablet by mouth daily. 12/13/14   [name], MD  metFORMIN (GLUCOPHAGE) 500 MG tablet Take 1 tablet (500 mg total) by mouth daily with breakfast. 12/13/14   [name], MD  montelukast (SINGULAIR) 10 MG tablet TAKE 1 TABLET (10 MG TOTAL) BY MOUTH AT BEDTIME. 12/13/14   [name], MD  zoster vaccine live, PF, (ZOSTAVAX) 75643 UNT/0.65ML injection Inject 19,400 Units into the skin once. 06/06/14   [name], MD   BP 168/77 mmHg  Pulse 58  Resp 17  SpO2 99% Physical Exam  Constitutional: She is oriented to person, place, and time. She appears well-developed and well-nourished.  HENT:  Head: Normocephalic and atraumatic.  Right Ear: External ear normal.  Left Ear: External ear normal.  Nose: Nose normal.  Eyes: EOM are normal. Pupils are equal, round, and reactive to light. Right eye exhibits no discharge. Left eye exhibits no discharge.  Neck: Normal range of motion. Neck supple.  No meningismus  Cardiovascular: Normal rate, regular rhythm and normal heart sounds.   Pulmonary/Chest: Effort normal and breath sounds normal. She exhibits no tenderness.  Abdominal: Soft. There is no tenderness.  Neurological: She is alert and oriented to person, place, and time.  CN 2-12 grossly intact. 5/5 strength in all 4 extremities  Skin: Skin is warm and dry.  Nursing note and vitals reviewed.   ED Course  Procedures (including critical care time) Labs Review Labs Reviewed  CBC - Abnormal; Notable for the following:    WBC 11.2 (*)    RBC 3.75 (*)    Hemoglobin 11.4 (*)    HCT 34.5 (*)    All other components within normal limits  BASIC METABOLIC PANEL - Abnormal; Notable for the  following:    Glucose, Bld 129 (*)    All other components within normal limits  BRAIN NATRIURETIC PEPTIDE - Abnormal; Notable for the following:    B Natriuretic Peptide 155.1 (*)    All other components within normal limits  I-STAT TROPOININ, ED - Abnormal; Notable for the following:    Troponin i, poc 0.70 (*)    All other components within normal limits  MRSA PCR SCREENING  PROTIME-INR  TSH  CBC  HEPARIN LEVEL (UNFRACTIONATED)  TROPONIN I  TROPONIN I  HEMOGLOBIN P2R  BASIC METABOLIC PANEL  LIPID  PANEL    Imaging Review Ct Head Wo Contrast  12/19/2014   CLINICAL DATA:  Headache, dizziness, jaw tenderness for 8 hr.  EXAM: CT HEAD WITHOUT CONTRAST  TECHNIQUE: Contiguous axial images were obtained from the base of the skull through the vertex without intravenous contrast.  COMPARISON:  None.  FINDINGS: Generalized cerebral atrophy, slightly advanced for age. No intracranial hemorrhage, mass effect, or midline shift. No hydrocephalus. The basilar cisterns are patent. No evidence of territorial infarct. No intracranial fluid collection. Calvarium is intact. Included paranasal sinuses and mastoid air cells are well aerated.  IMPRESSION: 1. Mild generalized atrophy, slightly advanced for age. 2. No acute intracranial abnormality.   Electronically Signed   By: Jeb Levering M.D.   On: 12/19/2014 21:01   Dg Chest Port 1 View  12/19/2014   CLINICAL DATA:  Abnormal EKG. Headache. Dizziness. Chest pain. Symptoms for 1 day.  EXAM: PORTABLE CHEST - 1 VIEW  COMPARISON:  04/23/2010  FINDINGS: The cardiomediastinal contours are normal. Pulmonary vasculature is normal. No consolidation, pleural effusion, or pneumothorax. No acute osseous abnormalities are seen.  IMPRESSION: No acute pulmonary process.   Electronically Signed   By: Jeb Levering M.D.   On: 12/19/2014 20:04    ED ECG REPORT   Date: 12/19/2014  Rate: 59  Rhythm: sinus bradycardia  QRS Axis: normal  Intervals: normal  ST/T  Wave abnormalities: subtle ST elevation in II, III, AVF, no reciprocal changes  Conduction Disutrbances:none  Narrative Interpretation:   Old EKG Reviewed: changes noted  I have personally reviewed the EKG tracing and agree with the computerized printout as noted.    EKG Interpretation  Date/Time:  Monday December 19 2014 20:31:49 EDT Ventricular Rate:  59 PR Interval:  142 QRS Duration: 88 QT Interval:  435 QTC Calculation: 431 R Axis:   62 Text Interpretation:  Sinus rhythm Minimal ST elevation, anterior leads no significant change since 1 hour prior Confirmed by Orry Sigl  MD, Jamari Moten ([id number]) on 12/19/2014 8:46:26 PM       CRITICAL CARE Performed by: Sherwood Gambler T   Total critical care time: 30 minutes  Critical care time was exclusive of separately billable procedures and treating other patients.  Critical care was necessary to treat or prevent imminent or life-threatening deterioration.  Critical care was time spent personally by me on the following activities: development of treatment plan with patient and/or surrogate as well as nursing, discussions with consultants, evaluation of patient's response to treatment, examination of patient, obtaining history from patient or surrogate, ordering and performing treatments and interventions, ordering and review of laboratory studies, ordering and review of radiographic studies, pulse oximetry and re-evaluation of patient's condition.  MDM   Final diagnoses:  NSTEMI (non-ST elevated myocardial infarction)    8:20 PM D/w Dr. Claiborne Billings of cards, agrees with no STEMI at this time but concern for NSTEMI. Will get CT with atypical headache and if negative start on heparin. He will come evaluate.   Patient has subtle ST elevations but less than 1 mm. No reciprocal ST depressions and thus not a code STEMI. Symptoms mostly resolved at ER presentation. Most concerning for ACS, started on heparin after negative head CT given atypical headache with  other symptoms. Doubt PE or dissection, especially with improving symptoms. Given ASA by urgent care. Admit to cardiology.    Sherwood Gambler, MD 12/20/14 [id number]  Sherwood Gambler, MD 12/20/14 [phone]

## 2014-12-19 NOTE — Progress Notes (Signed)
Subjective:  Patient ID: Elaine Jackson, female    DOB: [birth year]  Age: 59 y.o. MRN: [id number]  CC: Headache; Dizziness; Jaw Pain; and Chest Pain   HPI Elaine Jackson presents  with multiple complaints. She was at home today and after she got up he experienced severe headache that's persisted. Said that she had pain in her jaw and her neck. She said that that's persisted as well she also had a sensation in her chest that was like a "dryness"" like you get before you get bronchitis. She denies any chest pain tightness heaviness pressure shortness of breath. The palpitations or irregular heartbeat. Any peripheral edema. She did have some nausea associated with the dizziness that she experiences morning she denied any diaphoresis. No improvement with over-the-counter indication.   She says that she contemplated calling 911 and having him attend her earlier in the day but didn't do that because of her 63 year old grandchild.Marland Kitchen  History Elaine Jackson has a past medical history of Hypertension; Diabetes mellitus; Hyperlipidemia; Family history of breast cancer in first degree relative; Sleep related leg cramps; Allergic rhinitis, cause unspecified; Obesity, unspecified; Unspecified vitamin D deficiency; Other chronic nonalcoholic liver disease; Chicken pox; Measles; Malignant neoplasm skin of face (02/16/2007); Allergy; and Heart murmur.   She has past surgical history that includes Laparoscopic gastric banding (07/03/10); Cholecystectomy; Tonsillectomy; and Colonoscopy (06/25/2007).   Her  family history includes Arthritis in her brother; Cancer in her sister; Cancer (age of onset: 22) in her sister; Dementia in her mother; Diabetes in her brother, father, mother, and sister; Heart disease in her father, maternal grandfather, mother, paternal grandfather, and paternal grandmother; Hyperlipidemia in her brother, father, and mother; Hypertension in her brother, father, mother, sister, and sister; Kidney disease in  her father and sister; Stroke in her father, maternal grandmother, paternal grandfather, and paternal grandmother.  She   reports that she has quit smoking. Her smoking use included Cigarettes. She has a 25 pack-year smoking history. She has never used smokeless tobacco. She reports that she does not drink alcohol or use illicit drugs.  Outpatient Prescriptions Prior to Visit  Medication Sig Dispense Refill  . atorvastatin (LIPITOR) 10 MG tablet Take 1 tablet (10 mg total) by mouth daily at 6 PM. 90 tablet 3  . cholecalciferol (VITAMIN D) 1000 UNITS tablet Take 2,000 Units by mouth daily.    . fluticasone (FLONASE) 50 MCG/ACT nasal spray Place 2 sprays into both nostrils daily. 16 g 11  . glucose blood (FREESTYLE TEST STRIPS) test strip Use as instructed One touch ultra 2 100 each 12  . glucose monitoring kit (FREESTYLE) monitoring kit 1 each by Does not apply route as needed for other. One touch ultra 2 1 each 0  . Iron TABS Take by mouth daily.    . Lancets (ONETOUCH ULTRASOFT) lancets Use as instructed One touch ultra 2 100 each 12  . losartan-hydrochlorothiazide (HYZAAR) 100-25 MG per tablet Take 1 tablet by mouth daily. 90 tablet 3  . metFORMIN (GLUCOPHAGE) 500 MG tablet Take 1 tablet (500 mg total) by mouth daily with breakfast. 90 tablet 3  . montelukast (SINGULAIR) 10 MG tablet TAKE 1 TABLET (10 MG TOTAL) BY MOUTH AT BEDTIME. 90 tablet 3  . zoster vaccine live, PF, (ZOSTAVAX) 50539 UNT/0.65ML injection Inject 19,400 Units into the skin once. 0.65 mL 0   No facility-administered medications prior to visit.    History   Social History  . Marital Status: Married    Spouse Name: N/A  .  Number of Children: 1  . Years of Education: N/A   Occupational History  . HAIR STYLIST    Social History Main Topics  . Smoking status: Former Smoker -- 1.00 packs/day for 25 years    Types: Cigarettes  . Smokeless tobacco: Never Used     Comment: quit age 42     smoked from 35 to 58 off  and on  . Alcohol Use: No  . Drug Use: No  . Sexual Activity: Yes    Birth Control/ Protection: Post-menopausal   Other Topics Concern  . None   Social History Narrative   Marital status:  Married; x 25 years. Second marriage. Happily married; no abuse      Children: one child, one stepson;two grandsons      Lives: with husband, daughter, 2 grandsons.      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      Tobacco:  None      Alcohol: rare/minimal.      Drugs:  None       Exercise: none       Seatbelt:  100%.       Sunscreen: SPF 15.      Guns:  Maybe?     Review of Systems  Constitutional: Negative for fever, chills, diaphoresis and appetite change.  HENT: Negative for congestion, ear pain, postnasal drip, sinus pressure and sore throat.   Eyes: Negative for pain and redness.  Respiratory: Positive for chest tightness. Negative for cough, shortness of breath and wheezing.   Cardiovascular: Positive for chest pain. Negative for palpitations and leg swelling.  Gastrointestinal: Negative for nausea, vomiting, abdominal pain, diarrhea, constipation and blood in stool.  Endocrine: Negative for polyuria.  Genitourinary: Negative for dysuria, urgency, frequency and flank pain.  Musculoskeletal: Negative for gait problem.  Skin: Negative for rash.  Neurological: Positive for dizziness. Negative for weakness and headaches.  Psychiatric/Behavioral: Negative for confusion and decreased concentration. The patient is not nervous/anxious.     Objective:  BP 154/62 mmHg  Pulse 66  Temp(Src) 98.7 F (37.1 C)  Resp 18  Ht '5\' 2"'  (1.575 m)  Wt 202 lb (91.627 kg)  BMI 36.94 kg/m2  SpO2 98%  Physical Exam  Constitutional: She is oriented to person, place, and time. She appears well-developed and well-nourished. No distress.  HENT:  Head: Normocephalic and atraumatic.  Right Ear: External ear normal.  Left Ear: External ear normal.  Nose: Nose  normal.  Eyes: Conjunctivae and EOM are normal. Pupils are equal, round, and reactive to light. No scleral icterus.  Neck: Normal range of motion. Neck supple. No tracheal deviation present.  Cardiovascular: Normal rate and regular rhythm.   Murmur heard. Pulmonary/Chest: Effort normal. No respiratory distress. She has no wheezes. She has no rales.  Abdominal: She exhibits no mass. There is no tenderness. There is no rebound and no guarding.  Musculoskeletal: She exhibits no edema.  Lymphadenopathy:    She has no cervical adenopathy.  Neurological: She is alert and oriented to person, place, and time. Coordination normal.  Skin: Skin is warm and dry. No rash noted.  Psychiatric: She has a normal mood and affect. Her behavior is normal.      Assessment & Plan:   Forestine was seen today for headache, dizziness, jaw pain and chest pain.  Diagnoses and all orders for this visit:  Chest pain, unspecified chest pain type Orders: -     EKG 12-Lead  Essential hypertension  Type 2 diabetes mellitus without complication  Pure hypercholesterolemia   I am having Ms. Caspers maintain her cholecalciferol, Iron, glucose monitoring kit, onetouch ultrasoft, glucose blood, zoster vaccine live (PF), atorvastatin, fluticasone, losartan-hydrochlorothiazide, metFORMIN, and montelukast.  No orders of the defined types were placed in this encounter.     The patient has a clearly abnormal EKG with elevated ST segments in V3 4 and 5. She had a normal EKG during her physical exam a week ago. She has given aspirin and an IV line was inserted she is placed on oxygen and EMS was called and she was sent to the emergency room for evaluation of her current complaint maintain hemodynamically stable  in the office  Appropriate red flag conditions were discussed with the patient as well as actions that should be taken.  Patient expressed his understanding.  Follow-up: No Follow-up on file.  Roselee Culver,  MD

## 2014-12-19 NOTE — ED Notes (Signed)
Per ems, pt from [facility]. 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 Pt has hx of migraines. Pt denies chest pain at this time, pt still having dull jaw pain and headache still. Pt from [facility]. Pt denies sob, n/v. Pt in nAD at this time. Pt has changes in EKG in V2-V4 where this is elevation that was not there 7 days ago. EMS VS 12 lead with EMS 12 lead showed some elevation in v3 and v4. EMS VS BP 151/106, HR 67, RR 22, 100% on 2L

## 2014-12-19 NOTE — ED Notes (Signed)
Cardiologist in room

## 2014-12-19 NOTE — Patient Instructions (Signed)

## 2014-12-19 NOTE — H&P (Signed)
Elaine Jackson is an 60 y.o. female.    Chief Complaint: jaw pain/headache/abnormal ECG Primary cardiologist: new HPI: [name] is a 59 yo woman with PMH of T2DM controlled previously on oral therapies, dyslipidemia, hypertension, family history of CAD/MI in mother/father with mother have sudden cardiac arrest in her early 23s (few years ago) followed by Dr. Ron Parker who presents with symptoms since 11:30 AM today. Elaine Jackson was sitting when she initially thought she had vertigo (first time) this am, some diaphoresis but no symptoms or actual loss of consciousness. She then developed some jaw pain/tooth pain that was 10/10 followed by development of posterior headache. She was with her granddaughter so she thought it was going to pass and didn't come in. It gradually improved. She went to urgent care at 4:30 pm this evening and was found to have abnormal ECG with subtle ST elevation and continued but improving symptoms - she received 324 mg asa. Here at [facility], symptoms largely improved with some 4-5/10 jaw discomfort and continued posterior headache. ECGs stable with subtle < 1/2 MM ST elevation in the inferior leads and sub 1/2 mm ST changes in anterior leads without overt reciprocal ST depression. She denies dyspnea, no exercise intolerance, no chest pain with exertion, no trial of NTG.   Past Medical History  Diagnosis Date  . Hypertension   . Diabetes mellitus   . Hyperlipidemia   . Family history of breast cancer in first degree relative     sister age 82  . Sleep related leg cramps   . Allergic rhinitis, cause unspecified   . Obesity, unspecified   . Unspecified vitamin D deficiency   . Other chronic nonalcoholic liver disease   . Chicken pox   . Measles   . Malignant neoplasm skin of face 02/16/2007    Basal Cell Carcinoma  Tafeen.  . Allergy   . Heart murmur     Past Surgical History  Procedure Laterality Date  . Laparoscopic gastric banding  07/03/10    Dr. Sherrin Daisy; [facility]  . Cholecystectomy    . Tonsillectomy    . Colonoscopy  06/25/2007    [place]. Normal. Repeat in ten years.    Family History  Problem Relation Age of Onset  . Diabetes Mother   . Heart disease Mother     CHF with defibrillator; carotid artery stenosis.  . Hypertension Mother   . Hyperlipidemia Mother   . Dementia Mother   . Heart disease Father   . Kidney disease Father     renal failure ESRD/peritoneal dialysis  . Diabetes Father   . Hypertension Father   . Hyperlipidemia Father   . Stroke Father   . Diabetes Sister   . Hypertension Sister   . Kidney disease Sister     renal failure  . Diabetes Brother   . Hypertension Brother   . Hyperlipidemia Brother   . Arthritis Brother   . Cancer Sister     cervical cancer  . Hypertension Sister   . Cancer Sister 96    Liver cancer from hepatitis C.  . Stroke Maternal Grandmother   . Heart disease Maternal Grandfather   . Stroke Paternal Grandmother   . Heart disease Paternal Grandmother   . Heart disease Paternal Grandfather   . Stroke Paternal Grandfather    Social History:  reports that she has quit smoking. Her smoking use included Cigarettes. She has a 25 pack-year smoking history. She has never used smokeless tobacco. She  reports that she does not drink alcohol or use illicit drugs.  Allergies:  Allergies  Allergen Reactions  . Sulfur Nausea Only and Other (See Comments)    ALSO VOMITING     (Not in a hospital admission)  Results for orders placed or performed during the hospital encounter of 12/19/14 (from the past 48 hour(s))  CBC     Status: Abnormal   Collection Time: 12/19/14  7:53 PM  Result Value Ref Range   WBC 11.2 (H) 4.0 - 10.5 K/uL   RBC 3.75 (L) 3.87 - 5.11 MIL/uL   Hemoglobin 11.4 (L) 12.0 - 15.0 g/dL   HCT 34.5 (L) 36.0 - 46.0 %   MCV 92.0 78.0 - 100.0 fL   MCH 30.4 26.0 - 34.0 pg   MCHC 33.0 30.0 - 36.0 g/dL   RDW 12.6 11.5 - 15.5 %   Platelets 252 150 - 400 K/uL  Protime-INR (if pt is  taking Coumadin)     Status: None   Collection Time: 12/19/14  7:53 PM  Result Value Ref Range   Prothrombin Time 14.0 11.6 - 15.2 seconds   INR 1.06 0.00 - 1.49  I-stat troponin, ED  (not at [facility], [facility])     Status: Abnormal   Collection Time: 12/19/14  7:58 PM  Result Value Ref Range   Troponin i, poc 0.70 (HH) 0.00 - 0.08 ng/mL   Comment NOTIFIED PHYSICIAN    Comment 3            Comment: Due to the release kinetics of cTnI, a negative result within the first hours of the onset of symptoms does not rule out myocardial infarction with certainty. If myocardial infarction is still suspected, repeat the test at appropriate intervals.    Dg Chest Port 1 View  12/19/2014   CLINICAL DATA:  Abnormal EKG. Headache. Dizziness. Chest pain. Symptoms for 1 day.  EXAM: PORTABLE CHEST - 1 VIEW  COMPARISON:  04/23/2010  FINDINGS: The cardiomediastinal contours are normal. Pulmonary vasculature is normal. No consolidation, pleural effusion, or pneumothorax. No acute osseous abnormalities are seen.  IMPRESSION: No acute pulmonary process.   Electronically Signed   By: Jeb Levering M.D.   On: 12/19/2014 20:04    Review of Systems  Constitutional: Positive for diaphoresis. Negative for fever, chills, weight loss and malaise/fatigue.  HENT: Negative for hearing loss and tinnitus.   Eyes: Negative for double vision and photophobia.  Respiratory: Negative for cough, sputum production and shortness of breath.   Cardiovascular: Negative for palpitations, orthopnea and leg swelling.       Chest "dry" sensation  Gastrointestinal: Negative for heartburn, nausea, vomiting and abdominal pain.  Genitourinary: Negative for dysuria and hematuria.  Musculoskeletal: Positive for neck pain. Negative for myalgias.       Jaw pain  Skin: Negative for rash.  Neurological: Positive for dizziness and headaches. Negative for sensory change, speech change, focal weakness, seizures and loss of consciousness.    Endo/Heme/Allergies: Negative for polydipsia. Does not bruise/bleed easily.  Psychiatric/Behavioral: Negative for depression, suicidal ideas and substance abuse.    Blood pressure 140/87, pulse 60, resp. rate 17, SpO2 100 %. Physical Exam  Nursing note and vitals reviewed. Constitutional: She is oriented to person, place, and time. She appears well-developed and well-nourished. No distress.  HENT:  Head: Normocephalic and atraumatic.  Nose: Nose normal.  Mouth/Throat: Oropharynx is clear and moist. No oropharyngeal exudate.  Eyes: Conjunctivae and EOM are normal. Pupils are equal, round, and reactive to light.  No scleral icterus.  Neck: Normal range of motion. Neck supple. No JVD present. No tracheal deviation present.  Cardiovascular: Normal rate, regular rhythm and intact distal pulses.  Exam reveals no gallop.   Murmur heard. Soft systolic murmur at LSB  Respiratory: Effort normal and breath sounds normal. No respiratory distress. She has no wheezes. She has no rales.  GI: Soft. Bowel sounds are normal. She exhibits no distension. There is no tenderness. There is no rebound.  Musculoskeletal: Normal range of motion. She exhibits no edema or tenderness.  Neurological: She is alert and oriented to person, place, and time. She has normal reflexes. No cranial nerve deficit. Coordination normal.  Skin: Skin is warm and dry. No rash noted. She is not diaphoretic. No erythema.  Psychiatric: She has a normal mood and affect. Her behavior is normal. Thought content normal.   labs coming in; troponin 0.7 EKG: SR, sub 1/2 MM ST elevation inferior/anterior leads, no real ST depression Chest x-ray: no acute process Assessment/Plan [name] is a 59 yo woman with PMH of T2DM controlled previously on oral therapies, dyslipidemia, hypertension who presents with jaw discomfort and headache and found to have abnormal ECG and elevated troponin. Differential diagnosis is musculoskeletal pain,  esophageal spasm, GERD, aortic dissection, pericarditis, ACS/NSTEMI among other etiologies. I favor a diagnosis of NSTEMI given family history, combination of chest sensation, jaw discomfort and sudden onset with abnormal ECG that doesn't meet STEMI criteria.. Will start heparin gtt if CT head normal, continue aspirin, admit to stepdown and watch closely for any changes in symptoms, low threshold to active cath lab.  Plan 1. NPO after MN for LHC in AM. I discussed this plan with the patient.  2. trend cardiac markers, observation on telemetry, admit to telemetry 3. plan for LHC in AM; if symptoms change, low threshold to activate cath lab 4. asa 81 mg, heparin gtt, no metoprolol given HR 50s 5. atorvastatin 80 mg qHS first dose now 6. hba1c, tsh, lipid panel, BNP 7. Echocardiogram in AM  Elaine Jackson 12/19/2014, 8:55 PM

## 2014-12-19 NOTE — Progress Notes (Signed)
ANTICOAGULATION CONSULT NOTE - Initial Consult  Pharmacy Consult for Heparin Indication: chest pain/ACS  Allergies  Allergen Reactions  . Sulfur Nausea Only and Other (See Comments)    ALSO VOMITING    Patient Measurements:   Heparin Dosing Weight: 71.3 kg  Vital Signs: Temp: 98.7 F (37.1 C) (06/27 1712) BP: 147/90 mmHg (06/27 2000) Pulse Rate: 59 (06/27 2000)  Labs:  Recent Labs  12/19/14 1953  HGB 11.4*  HCT 34.5*  PLT 252  LABPROT 14.0  INR 1.06    Estimated Creatinine Clearance: 75.9 mL/min (by C-G formula based on Cr of 0.84).   Medical History: Past Medical History  Diagnosis Date  . Hypertension   . Diabetes mellitus   . Hyperlipidemia   . Family history of breast cancer in first degree relative     sister age 75  . Sleep related leg cramps   . Allergic rhinitis, cause unspecified   . Obesity, unspecified   . Unspecified vitamin D deficiency   . Other chronic nonalcoholic liver disease   . Chicken pox   . Measles   . Malignant neoplasm skin of face 02/16/2007    Basal Cell Carcinoma  Tafeen.  . Allergy   . Heart murmur     Medications:   (Not in a hospital admission) Scheduled:  Infusions:   Assessment: 59yo female with history of HTN, HLD and DM2 presents with headache, dizziness and chest pain. Pharmacy is consulted to dose heparin for ACS/chest pain. Hgb 11.4, Plt wnl, trop 0.7, INR 1.1.  Goal of Therapy:  Heparin level 0.3-0.7 units/ml Monitor platelets by anticoagulation protocol: Yes   Plan:  Give 3500 units bolus x 1 Start heparin infusion at 900 units/hr Check anti-Xa level in 6 hours and daily while on heparin Continue to monitor H&H and platelets  Andrey Cota. Diona Foley, PharmD Clinical Pharmacist Pager [phone] 12/19/2014,8:19 PM

## 2014-12-20 ENCOUNTER — Inpatient Hospital Stay (HOSPITAL_COMMUNITY): Payer: BLUE CROSS/BLUE SHIELD

## 2014-12-20 ENCOUNTER — Encounter (HOSPITAL_COMMUNITY)
Admission: EM | Disposition: A | Discharge status: Home / Self Care | Payer: BLUE CROSS/BLUE SHIELD | Source: Home / Self Care | Attending: Cardiology

## 2014-12-20 DIAGNOSIS — I2510 Atherosclerotic heart disease of native coronary artery without angina pectoris: Secondary | ICD-10-CM

## 2014-12-20 DIAGNOSIS — E119 Type 2 diabetes mellitus without complications: Secondary | ICD-10-CM

## 2014-12-20 DIAGNOSIS — E785 Hyperlipidemia, unspecified: Secondary | ICD-10-CM

## 2014-12-20 HISTORY — PX: CARDIAC CATHETERIZATION: SHX172

## 2014-12-20 LAB — CBC
HCT: 32.6 % — ABNORMAL LOW (ref 36.0–46.0)
Hemoglobin: 10.7 g/dL — ABNORMAL LOW (ref 12.0–15.0)
MCH: 30.3 pg (ref 26.0–34.0)
MCHC: 32.8 g/dL (ref 30.0–36.0)
MCV: 92.4 fL (ref 78.0–100.0)
Platelets: 229 10*3/uL (ref 150–400)
RBC: 3.53 MIL/uL — AB (ref 3.87–5.11)
RDW: 12.9 % (ref 11.5–15.5)
WBC: 8.5 10*3/uL (ref 4.0–10.5)

## 2014-12-20 LAB — MRSA PCR SCREENING: MRSA by PCR: NEGATIVE

## 2014-12-20 LAB — TROPONIN I
TROPONIN I: 13.36 ng/mL — AB (ref <0.031)
TROPONIN I: 15 ng/mL — AB (ref <0.031)
Troponin I: 30.01 ng/mL

## 2014-12-20 LAB — LIPID PANEL
Cholesterol: 149 mg/dL (ref 0–200)
HDL: 43 mg/dL (ref >40)
LDL CALC: 78 mg/dL (ref 0–99)
Total CHOL/HDL Ratio: 3.5 RATIO
Triglycerides: 138 mg/dL (ref <150)
VLDL: 28 mg/dL (ref 0–40)

## 2014-12-20 LAB — BASIC METABOLIC PANEL WITH GFR
Anion gap: 6 (ref 5–15)
BUN: 13 mg/dL (ref 6–20)
CO2: 24 mmol/L (ref 22–32)
Calcium: 8.7 mg/dL — ABNORMAL LOW (ref 8.9–10.3)
Chloride: 108 mmol/L (ref 101–111)
Creatinine, Ser: 0.78 mg/dL (ref 0.44–1.00)
GFR calc Af Amer: >60 mL/min
GFR calc non Af Amer: >60 mL/min
Glucose, Bld: 115 mg/dL — ABNORMAL HIGH (ref 65–99)
Potassium: 3.8 mmol/L (ref 3.5–5.1)
Sodium: 138 mmol/L (ref 135–145)

## 2014-12-20 LAB — TSH: TSH: 0.376 u[IU]/mL (ref 0.350–4.500)

## 2014-12-20 LAB — GLUCOSE, CAPILLARY
GLUCOSE-CAPILLARY: 123 mg/dL — AB (ref 65–99)
Glucose-Capillary: 107 mg/dL — ABNORMAL HIGH (ref 65–99)
Glucose-Capillary: 113 mg/dL — ABNORMAL HIGH (ref 65–99)
Glucose-Capillary: 103 mg/dL — ABNORMAL HIGH (ref 65–99)
Glucose-Capillary: 138 mg/dL — ABNORMAL HIGH (ref 65–99)

## 2014-12-20 LAB — BRAIN NATRIURETIC PEPTIDE: B Natriuretic Peptide: 213.5 pg/mL — ABNORMAL HIGH (ref 0.0–100.0)

## 2014-12-20 LAB — HEPARIN LEVEL (UNFRACTIONATED)
Heparin Unfractionated: 0.11 [IU]/mL — ABNORMAL LOW (ref 0.30–0.70)
Heparin Unfractionated: 0.47 IU/mL (ref 0.30–0.70)

## 2014-12-20 SURGERY — LEFT HEART CATH AND CORONARY ANGIOGRAPHY
Anesthesia: LOCAL

## 2014-12-20 MED ORDER — SODIUM CHLORIDE 0.9 % IJ SOLN
3.0000 mL | Freq: Two times a day (BID) | INTRAMUSCULAR | Status: DC
  Administered 2014-12-20: 3 mL via INTRAVENOUS

## 2014-12-20 MED ORDER — VERAPAMIL HCL 2.5 MG/ML IV SOLN
INTRAVENOUS | Status: DC | PRN
  Administered 2014-12-20: 15:00:00 via INTRA_ARTERIAL

## 2014-12-20 MED ORDER — SODIUM CHLORIDE 0.9 % IJ SOLN
3.0000 mL | Freq: Two times a day (BID) | INTRAMUSCULAR | Status: DC
  Administered 2014-12-20 – 2014-12-21 (×2): 3 mL via INTRAVENOUS

## 2014-12-20 MED ORDER — IOHEXOL 350 MG/ML SOLN
INTRAVENOUS | Status: DC | PRN
  Administered 2014-12-20: 120 mL via INTRA_ARTERIAL

## 2014-12-20 MED ORDER — LIDOCAINE HCL (PF) 1 % IJ SOLN
INTRAMUSCULAR | Status: AC
  Filled 2014-12-20: qty 30

## 2014-12-20 MED ORDER — NITROGLYCERIN 1 MG/10 ML FOR IR/CATH LAB
INTRA_ARTERIAL | Status: AC
  Filled 2014-12-20: qty 10

## 2014-12-20 MED ORDER — SODIUM CHLORIDE 0.9 % IJ SOLN: 3.0000 mL | INTRAMUSCULAR | Status: DC | PRN

## 2014-12-20 MED ORDER — SODIUM CHLORIDE 0.9 % IV SOLN: 250.0000 mL | INTRAVENOUS | Status: DC | PRN

## 2014-12-20 MED ORDER — HEPARIN (PORCINE) IN NACL 2-0.9 UNIT/ML-% IJ SOLN
INTRAMUSCULAR | Status: AC
  Filled 2014-12-20: qty 1500

## 2014-12-20 MED ORDER — ASPIRIN 81 MG PO CHEW: 81.0000 mg | CHEWABLE_TABLET | ORAL | Status: DC

## 2014-12-20 MED ORDER — SODIUM CHLORIDE 0.9 % IV SOLN
INTRAVENOUS | Status: DC
  Administered 2014-12-20: 13:00:00 via INTRAVENOUS

## 2014-12-20 MED ORDER — SODIUM CHLORIDE 0.9 % WEIGHT BASED INFUSION: 1.0000 mL/kg/h | INTRAVENOUS | Status: AC

## 2014-12-20 MED ORDER — VERAPAMIL HCL 2.5 MG/ML IV SOLN
INTRAVENOUS | Status: AC
  Filled 2014-12-20: qty 2

## 2014-12-20 MED ORDER — HEPARIN SODIUM (PORCINE) 1000 UNIT/ML IJ SOLN
INTRAMUSCULAR | Status: DC | PRN
  Administered 2014-12-20: 5000 [IU] via INTRAVENOUS

## 2014-12-20 MED ORDER — HEPARIN SODIUM (PORCINE) 1000 UNIT/ML IJ SOLN
INTRAMUSCULAR | Status: AC
  Filled 2014-12-20: qty 1

## 2014-12-20 MED ORDER — HEPARIN BOLUS VIA INFUSION
2000.0000 [IU] | Freq: Once | INTRAVENOUS | Status: AC
  Administered 2014-12-20: 2000 [IU] via INTRAVENOUS
  Filled 2014-12-20: qty 2000

## 2014-12-20 SURGICAL SUPPLY — 18 items
CATH INFINITI 5 FR AL2 (CATHETERS) ×1 IMPLANT
CATH INFINITI 5 FR JL3.5 (CATHETERS) ×1 IMPLANT
CATH INFINITI 5FR ANG PIGTAIL (CATHETERS) ×2 IMPLANT
CATH INFINITI 5FR JL4 (CATHETERS) ×1 IMPLANT
CATH INFINITI 5FR MULTPACK ANG (CATHETERS) IMPLANT
CATH OPTITORQUE TIG 4.0 5F (CATHETERS) ×2 IMPLANT
DEVICE RAD COMP TR BAND LRG (VASCULAR PRODUCTS) ×2 IMPLANT
GLIDESHEATH SLEND A-KIT 6F 22G (SHEATH) ×2 IMPLANT
GLIDESHEATH SLEND SS 6F .021 (SHEATH) ×1 IMPLANT
KIT HEART LEFT (KITS) ×2 IMPLANT
PACK CARDIAC CATHETERIZATION (CUSTOM PROCEDURE TRAY) ×2 IMPLANT
SHEATH PINNACLE 5F 10CM (SHEATH) IMPLANT
SYR MEDRAD MARK V 150ML (SYRINGE) ×2 IMPLANT
TRANSDUCER W/STOPCOCK (MISCELLANEOUS) ×2 IMPLANT
TUBING CIL FLEX 10 FLL-RA (TUBING) ×2 IMPLANT
WIRE EMERALD 3MM-J .035X150CM (WIRE) IMPLANT
WIRE HI TORQ VERSACORE-J 145CM (WIRE) ×1 IMPLANT
WIRE SAFE-T 1.5MM-J .035X260CM (WIRE) ×2 IMPLANT

## 2014-12-20 NOTE — Op Note (Signed)
CARDIAC CATHETERIZATION REPORT   Procedures performed:  1. Left heart catheterization  2. Selective coronary angiography  3. Left ventriculography   Reason for procedure:  Acute Non ST segment elevation myocardial infarction   Procedure performed by: Sanda Klein, MD, [facility]  Complications: none   Estimated blood loss: less than 5 mL   History:  59 yo woman with PMH of T2DM controlled previously on oral therapies, dyslipidemia, hypertension, family history of CAD/MI in mother/father with mother have sudden cardiac arrest in her early 90s (few years ago) followed by Dr. Ron Parker, presented with jaw pain, subtle ECG changes and abnormal cardiac enzymes (troponin 30 with rapid decline)  Consent: The risks, benefits, and details of the procedure were explained to the patient. Risks including death, MI, stroke, bleeding, limb ischemia, renal failure and allergy were described and accepted by the patient. Informed written consent was obtained prior to proceeding.  Technique: The patient was brought to the cardiac catheterization laboratory in the fasting state. He was prepped and draped in the usual sterile fashion. Local anesthesia with 1% lidocaine was administered to the right wrist area. Using the modified Seldinger technique a 5 French right radial femoral artery sheath was introduced without difficulty. Under fluoroscopic guidance, using 5 [place] AL2, JL4, JR and angled pigtail catheters, selective cannulation of the left coronary artery, right coronary artery and left ventricle were respectively performed. Several coronary angiograms in a variety of projections were recorded, as well as a left ventriculogram in the RAO projection. Left ventricular pressure and a pull back to the aorta were recorded. No immediate complications occurred. At the end of the procedure, all catheters were removed. After the procedure, hemostasis will be achieved with manual pressure.  Contrast used: 120 mL  Omnipaque  Angiographic Findings:  1. The left main coronary artery is very short, free of significant atherosclerosis and bifurcates in the usual fashion into the left anterior descending artery and left circumflex coronary artery.  2. The left anterior descending artery is a medium size vessel that reaches the apex and generates 2 major diagonal branches. There is evidence of minimal luminal irregularities and no calcification. In the very distal fifth of the artery, just before the apex, there is an irregular, hazy, high grade stenosis with TIMI 2 flow to the apex. The vessel is 1.0-1.5 mm in diameter at this level. 3. The left circumflex coronary artery is a large-size vessel non dominant vessel that generates 2 major oblique marginal arteries. There is evidence of no luminal irregularities and no calcification. No hemodynamically meaningful stenoses are seen. 4. The right coronary artery is a large-size dominant vessel that generates a long posterior lateral ventricular system as well as the PDA. There is evidence of no luminal irregularities and no calcification. No hemodynamically meaningful stenoses are seen.  5. The left ventricle is normal in size. The left ventricle systolic function is normal with an estimated ejection fraction of 55%. Regional wall motion abnormalities are seen: severe hypokinesis of the apical "cap". No left ventricular thrombus is seen. There is no mitral insufficiency. The ascending aorta appears normal. There is no aortic valve stenosis by pullback. The left ventricular end-diastolic pressure is 22 mm Hg.    IMPRESSIONS:  Small to moderate apical infarction due to a distal LAD stenosis with reestablished (TIMI2) flow  RECOMMENDATION:  Medical therapy with statin, lifelong ASA, dual antiplatelet therapy for 12 months. Prefer ARB/ACEi for BP control. Beta blockers would be helpful, but precluded by baseline bradycardia. Aggressive control of risk  factors, especially  DM. Possible DC in AM.     Sanda Klein, MD, [facility] [phone] office [phone] pager

## 2014-12-20 NOTE — Progress Notes (Signed)
Patient Profile: 59 yo woman with PMH of T2DM controlled previously on oral therapies, dyslipidemia, hypertension, family history of CAD/MI in mother/father, admitted for jaw pain with abnormal EKG. Ruled in for NSTEMI with troponin peak of 30.  Subjective: Jaw pain resolved. Denies CP and dyspnea. Still with mild HA.   Objective: Vital signs in last 24 hours: Temp:  [97.9 F (36.6 C)-98.7 F (37.1 C)] 97.9 F (36.6 C) (06/28 0732) Pulse Rate:  [53-66] 53 (06/28 0732) Resp:  [12-20] 12 (06/28 0732) BP: (140-168)/(62-90) 140/75 mmHg (06/28 0732) SpO2:  [98 %-100 %] 100 % (06/28 0732) Weight:  [202 lb (91.627 kg)] 202 lb (91.627 kg) (06/27 2253) Last BM Date: 12/19/14  Intake/Output from previous day: 06/27 0701 - 06/28 0700 In: 1 [P.O.:1] Out: -  Intake/Output this shift:    Medications Current Facility-Administered Medications  Medication Dose Route Frequency Provider Last Rate Last Dose  . 0.9 %  sodium chloride infusion  250 mL Intravenous PRN Brett Canales, PA-C      . 0.9 %  sodium chloride infusion   Intravenous Continuous Brett Canales, PA-C 100 mL/hr at 12/20/14 0745    . acetaminophen (TYLENOL) tablet 650 mg  650 mg Oral Q4H PRN Jules Husbands, MD   650 mg at 12/20/14 0440  . aspirin chewable tablet 324 mg  324 mg Oral NOW Jules Husbands, MD   324 mg at 12/19/14 2300   Or  . aspirin suppository 300 mg  300 mg Rectal NOW Jules Husbands, MD      . aspirin chewable tablet 81 mg  81 mg Oral Pre-Cath Brett Canales, PA-C   81 mg at 12/20/14 0745  . aspirin EC tablet 81 mg  81 mg Oral Daily Jules Husbands, MD   81 mg at 12/20/14 0809  . atorvastatin (LIPITOR) tablet 80 mg  80 mg Oral q1800 Jules Husbands, MD   80 mg at 12/19/14 2316  . heparin ADULT infusion 100 units/mL (25000 units/250 mL)  1,100 Units/hr Intravenous Continuous Dorothy Spark, MD 11 mL/hr at 12/20/14 0810 1,100 Units/hr at 12/20/14 0810  . morphine 2 MG/ML injection 2-4 mg  2-4 mg Intravenous Q3H PRN Jules Husbands,  MD      . nitroGLYCERIN (NITROSTAT) SL tablet 0.4 mg  0.4 mg Sublingual Q5 Min x 3 PRN Jules Husbands, MD      . nitroGLYCERIN 50 mg in dextrose 5 % 250 mL (0.2 mg/mL) infusion  0-200 mcg/min Intravenous Titrated Jules Husbands, MD 1.5 mL/hr at 12/19/14 2316 5 mcg/min at 12/19/14 2316  . ondansetron (ZOFRAN) injection 4 mg  4 mg Intravenous Q6H PRN Jules Husbands, MD      . sodium chloride 0.9 % injection 3 mL  3 mL Intravenous Q12H Brett Canales, PA-C   3 mL at 12/20/14 1000  . sodium chloride 0.9 % injection 3 mL  3 mL Intravenous PRN Brett Canales, PA-C        PE: General appearance: alert, cooperative and no distress Neck: no carotid bruit and no JVD Lungs: clear to auscultation bilaterally Heart: regular rate and rhythm, S1, S2 normal, no murmur, click, rub or gallop Extremities: no LEE Pulses: 2+ and symmetric Skin: warm and dry Neurologic: Grossly normal  Lab Results:   Recent Labs  12/19/14 1953 12/20/14 0528  WBC 11.2* 8.5  HGB 11.4* 10.7*  HCT 34.5* 32.6*  PLT 252 229   BMET  Recent Labs  12/19/14 1953 12/20/14 0528  NA  140 138  K 4.4 3.8  CL 107 108  CO2 25 24  GLUCOSE 129* 115*  BUN 13 13  CREATININE 0.83 0.78  CALCIUM 9.1 8.7*   PT/INR  Recent Labs  12/19/14 1953  LABPROT 14.0  INR 1.06   Cholesterol  Recent Labs  12/20/14 0528  CHOL 149   Cardiac Panel (last 3 results)  Recent Labs  12/19/14 2350 12/20/14 0528  TROPONINI 13.36* 30.01*      Assessment/Plan    Principal Problem:   NSTEMI (non-ST elevated myocardial infarction) Active Problems:   Diabetes mellitus type II, controlled   Essential hypertension, benign   Dyslipidemia   1. NSTEMI: Troponin peaked to 30! Jaw pain resolved. No CP or dyspnea. VSS. NSR/ sinus brady on tele.  Continue IV heparin, IV nitro ASA and statin. HR to low to start BB. Plan for [facility] +/- PCI today with Dr. Claiborne Billings.   2. HA: still with HA. CT of head on admit was negative. HA likely 2/2 to IV nitro. Use  Tylenol PRN.   3. HTN: in the 811S systolic. HR will not tolerate addition of a BB (HR in the mid 50s). Renal function is stable. Add lisinopril post cath.   4. Dyslipidemia: FLP shows LDL close to goal at 78 mg/dL. Continue high dose statin therapy given NSTEMI.   5. Diabetes: fasting glucose ok. Hgb A1c pending. Monitor. Will use SSI if needed.    LOS: 1 day    Brittainy M. Ladoris Gene 12/20/2014 8:23 AM  Personally seen and examined. Agree with above. NSTEMI - no chest pain per se just jaw and back of neck pain. Felt minimal chest tightness, like bronchitis. Trop 30  Cath. Willing to proceed. Risks of CVA, MI, death, bleeding.   Candee Furbish, MD

## 2014-12-20 NOTE — Progress Notes (Signed)
CRITICAL VALUE ALERT  Critical value received:  Troponin 13.36  Date of notification:  12/20/14  Time of notification:  0101  Critical value read back:Yes.    Nurse who received alert:  Ronette Deter  MD notified (1st page):  Alejandro Mulling, MD  Time of first page:  0102  MD notified (2nd page): J.Kelly, MD  Time of second page: 0123  Responding MD:  Alejandro Mulling, MD  Time MD responded:  [phone]

## 2014-12-20 NOTE — Progress Notes (Signed)
[place] for Heparin Indication: chest pain/ACS  Allergies  Allergen Reactions  . Sulfa Antibiotics Nausea And Vomiting    Patient Measurements: Height: 5\' 2"  (157.5 cm) Weight: 202 lb (91.627 kg) IBW/kg (Calculated) : 50.1 Heparin Dosing Weight: 71.3 kg  Vital Signs: Temp: 97.9 F (36.6 C) (06/28 0732) Temp Source: Oral (06/28 0732) BP: 140/75 mmHg (06/28 0732) Pulse Rate: 53 (06/28 0732)  Labs:  Recent Labs  12/19/14 1953 12/19/14 2350 12/20/14 0528  HGB 11.4*  --  10.7*  HCT 34.5*  --  32.6*  PLT 252  --  229  LABPROT 14.0  --   --   INR 1.06  --   --   HEPARINUNFRC  --   --  0.11*  CREATININE 0.83  --  0.78  TROPONINI  --  13.36* 30.01*    Estimated Creatinine Clearance: 79.7 mL/min (by C-G formula based on Cr of 0.78).  Assessment: 59 y.o. female with chest pain for heparin   Goal of Therapy:  Heparin level 0.3-0.7 units/ml Monitor platelets by anticoagulation protocol: Yes   Plan:  Heparin 2000 units IV bolus, then increase heparin 1100 units/hr F/U after cath  Phillis Knack, PharmD, BCPS  12/20/2014,7:57 AM

## 2014-12-20 NOTE — Progress Notes (Signed)
Utilization review completed.  

## 2014-12-20 NOTE — Progress Notes (Signed)
6 beat run of Vtach at 12:03pm MD notified. Pt asymtomatic. Will continue to monitor

## 2014-12-20 NOTE — Progress Notes (Signed)
CRITICAL VALUE ALERT  Critical value received:  Troponin 30.01  Date of notification:  12/20/14  Time of notification:  0644  Critical value read back:Yes.    Nurse who received alert:  Ronette Deter  MD notified (1st page):  Samara Snide, [place]  Time of first page:  [phone]  MD notified (2nd page):  Time of second page:  Responding MD:  Samara Snide, [place]  Time MD responded:  [phone]

## 2014-12-20 NOTE — Progress Notes (Addendum)
Cath Lab Visit (complete for each Cath Lab visit)  Clinical Evaluation Leading to the Procedure:   ACS: Yes.    Non-ACS:    Anginal Classification: CCS IV  Anti-ischemic medical therapy: No Therapy  Non-Invasive Test Results: No non-invasive testing performed  Prior CABG: No previous CABG      CAD Assessment (Coronary Angiography With or Without Left Heart Catheterization and/or Left Ventriculography)  Patient Information:  UA/NSTEMI, Low Risk  AUC Score:   A (7)   Indication:   3

## 2014-12-20 NOTE — Interval H&P Note (Signed)
History and Physical Interval Note:  12/20/2014 2:40 PM  Elaine Jackson  has presented today for surgery, with the diagnosis of cp  The various methods of treatment have been discussed with the patient and family. After consideration of risks, benefits and other options for treatment, the patient has consented to  Procedure(s): Left Heart Cath and Coronary Angiography (N/A) as a surgical intervention .  The patient's history has been reviewed, patient examined, no change in status, stable for surgery.  I have reviewed the patient's chart and labs.  Questions were answered to the patient's satisfaction.     Bert Ptacek

## 2014-12-20 NOTE — Progress Notes (Signed)
[place] for Heparin Indication: chest pain/ACS  Allergies  Allergen Reactions  . Sulfa Antibiotics Nausea And Vomiting    Patient Measurements: Height: 5\' 2"  (157.5 cm) Weight: 202 lb (91.627 kg) IBW/kg (Calculated) : 50.1 Heparin Dosing Weight: 71.3 kg  Vital Signs: Temp: 97.7 F (36.5 C) (06/28 1118) Temp Source: Oral (06/28 1118) BP: 135/70 mmHg (06/28 1119) Pulse Rate: 54 (06/28 1119)  Labs:  Recent Labs  12/19/14 1953 12/19/14 2350 12/20/14 0528 12/20/14 1040 12/20/14 1345  HGB 11.4*  --  10.7*  --   --   HCT 34.5*  --  32.6*  --   --   PLT 252  --  229  --   --   LABPROT 14.0  --   --   --   --   INR 1.06  --   --   --   --   HEPARINUNFRC  --   --  0.11*  --  0.47  CREATININE 0.83  --  0.78  --   --   TROPONINI  --  13.36* 30.01* 15.00*  --     Estimated Creatinine Clearance: 79.7 mL/min (by C-G formula based on Cr of 0.78).  Assessment: 59 year old female presenting with jaw pain, HA, and vertigo on 12/19/14. At urgent care, her EKG had subtle ST-elevation (with continued, but improving symptoms)  PMH: DM2, HLD, HTN  AC: none PTA. On heparin for ACS/NSTEMI. Initial HL low at 0.11, f/u 0.47. Patient scheduled for LHC today at 1200  Goal of Therapy:  Heparin level 0.3-0.7 units/ml Monitor platelets by anticoagulation protocol: Yes   Plan:  Continue heparin at 1100 units/hr F/U after cath -Daily CBC, HL  Levester Fresh, PharmD, BCPS Clinical Pharmacist Pager [phone] 12/20/2014 2:21 PM

## 2014-12-21 ENCOUNTER — Telehealth: Payer: Self-pay | Admitting: Nurse Practitioner

## 2014-12-21 ENCOUNTER — Inpatient Hospital Stay (HOSPITAL_COMMUNITY): Payer: BLUE CROSS/BLUE SHIELD

## 2014-12-21 ENCOUNTER — Encounter (HOSPITAL_COMMUNITY): Payer: Self-pay | Admitting: Cardiovascular Disease

## 2014-12-21 DIAGNOSIS — R079 Chest pain, unspecified: Secondary | ICD-10-CM

## 2014-12-21 DIAGNOSIS — G43909 Migraine, unspecified, not intractable, without status migrainosus: Secondary | ICD-10-CM | POA: Insufficient documentation

## 2014-12-21 DIAGNOSIS — I10 Essential (primary) hypertension: Secondary | ICD-10-CM

## 2014-12-21 DIAGNOSIS — I214 Non-ST elevation (NSTEMI) myocardial infarction: Principal | ICD-10-CM

## 2014-12-21 LAB — CBC
HCT: 33.2 % — ABNORMAL LOW (ref 36.0–46.0)
Hemoglobin: 10.8 g/dL — ABNORMAL LOW (ref 12.0–15.0)
MCH: 30.2 pg (ref 26.0–34.0)
MCHC: 32.5 g/dL (ref 30.0–36.0)
MCV: 92.7 fL (ref 78.0–100.0)
Platelets: 223 10*3/uL (ref 150–400)
RBC: 3.58 MIL/uL — AB (ref 3.87–5.11)
RDW: 12.6 % (ref 11.5–15.5)
WBC: 7.8 10*3/uL (ref 4.0–10.5)

## 2014-12-21 LAB — BASIC METABOLIC PANEL
ANION GAP: 9 (ref 5–15)
BUN: 13 mg/dL (ref 6–20)
CO2: 23 mmol/L (ref 22–32)
Calcium: 8.6 mg/dL — ABNORMAL LOW (ref 8.9–10.3)
Chloride: 107 mmol/L (ref 101–111)
Creatinine, Ser: 0.86 mg/dL (ref 0.44–1.00)
GFR calc non Af Amer: >60 mL/min (ref >60)
Glucose, Bld: 106 mg/dL — ABNORMAL HIGH (ref 65–99)
Potassium: 3.9 mmol/L (ref 3.5–5.1)
Sodium: 139 mmol/L (ref 135–145)

## 2014-12-21 LAB — GLUCOSE, CAPILLARY
GLUCOSE-CAPILLARY: 113 mg/dL — AB (ref 65–99)
Glucose-Capillary: 117 mg/dL — ABNORMAL HIGH (ref 65–99)

## 2014-12-21 LAB — HEMOGLOBIN A1C
Hgb A1c MFr Bld: 5.6 % (ref 4.8–5.6)
Mean Plasma Glucose: 114 mg/dL

## 2014-12-21 LAB — HEPARIN LEVEL (UNFRACTIONATED)

## 2014-12-21 MED ORDER — NITROGLYCERIN 0.4 MG SL SUBL: 0.4000 mg | SUBLINGUAL_TABLET | SUBLINGUAL | Status: DC | PRN

## 2014-12-21 MED ORDER — CLOPIDOGREL BISULFATE 75 MG PO TABS: 75.0000 mg | ORAL_TABLET | Freq: Every day | ORAL | Status: DC

## 2014-12-21 MED ORDER — CLOPIDOGREL BISULFATE 75 MG PO TABS
300.0000 mg | ORAL_TABLET | Freq: Once | ORAL | Status: AC
  Administered 2014-12-21: 300 mg via ORAL
  Filled 2014-12-21: qty 4

## 2014-12-21 MED ORDER — HYDROCHLOROTHIAZIDE 25 MG PO TABS
25.0000 mg | ORAL_TABLET | Freq: Every day | ORAL | Status: DC
  Administered 2014-12-21: 25 mg via ORAL
  Filled 2014-12-21: qty 1

## 2014-12-21 MED ORDER — ASPIRIN 81 MG PO TBEC: 81.0000 mg | DELAYED_RELEASE_TABLET | Freq: Every day | ORAL | Status: DC

## 2014-12-21 MED ORDER — LOSARTAN POTASSIUM 50 MG PO TABS
100.0000 mg | ORAL_TABLET | Freq: Every day | ORAL | Status: DC
  Administered 2014-12-21: 100 mg via ORAL
  Filled 2014-12-21: qty 2

## 2014-12-21 MED ORDER — LISINOPRIL 5 MG PO TABS: 5.0000 mg | ORAL_TABLET | Freq: Every day | ORAL | Status: DC

## 2014-12-21 MED ORDER — ATORVASTATIN CALCIUM 80 MG PO TABS: 80.0000 mg | ORAL_TABLET | Freq: Every day | ORAL | Status: DC

## 2014-12-21 MED FILL — Heparin Sodium (Porcine) 2 Unit/ML in Sodium Chloride 0.9%: INTRAMUSCULAR | Qty: 1500 | Status: AC

## 2014-12-21 MED FILL — Nitroglycerin IV Soln 100 MCG/ML in D5W: INTRA_ARTERIAL | Qty: 10 | Status: AC

## 2014-12-21 MED FILL — Lidocaine HCl Local Preservative Free (PF) Inj 1%: INTRAMUSCULAR | Qty: 30 | Status: AC

## 2014-12-21 NOTE — Progress Notes (Signed)
Discharge instructions and teaching reviewed. Pt VSS. Discharging home with family.

## 2014-12-21 NOTE — Discharge Summary (Signed)
Discharge Summary   Patient ID: Elaine Jackson,  MRN: [id number], DOB/AGE: 1956-05-01 59 y.o.  Admit date: 12/19/2014 Discharge date: 12/21/2014  Primary Care Provider: [place] Primary Cardiologist: Dr. Marlou Porch  Discharge Diagnoses Principal Problem:   NSTEMI (non-ST elevated myocardial infarction) Active Problems:   Diabetes mellitus type II, controlled   Essential hypertension, benign   Dyslipidemia   Allergies Allergies  Allergen Reactions  . Sulfa Antibiotics Nausea And Vomiting    Procedures  Cardiac catheterization Conclusion     Dist LAD lesion, 90% stenosed.  Apical infarction with preserved overall LVEF  IMPRESSIONS:  Single vessel CAD. Small to moderate apical infarction due to a distal LAD stenosis with reestablished (TIMI2) flow   RECOMMENDATION:  Medical therapy with statin, lifelong ASA, dual antiplatelet therapy for 12 months.  Prefer ARB/ACEi for BP control.  Beta blockers would be helpful, but precluded by baseline bradycardia.  Aggressive control of risk factors, especially DM.       Hospital Course  The patient is a 59 year old female with past medical history of type 2 DM on PRN metformin and been controlled mainly with diet and exercise, HTN, HLD, family history of CAD/MI. She presented to [facility] on 12/19/2014 for headache, jaw pain which started around 11:30 AM. The symptom was associated with diaphoresis. Patient initially did not try to seek medical attention. However she eventually went to urgent care around 4:30 PM that evening and was found to have abnormal EKG with subtle ST elevation in the continued improving symptom. She was referred to [facility]. On arrival, her symptom has largely improved with 4-5 out of 10 jaw discomfort and continued posterior headache. EKG showed minimal ST elevation in inferior lead and some ST depression in the anterior lead without reciprocal changes. She was placed on IV heparin.  Overnight, her serial troponin went up to 30, before trending back down. We were not unable to add metoprolol given baseline bradycardia. Although nursing staff did note an episode of nonsustained VT on telemetry on 6/28, further review of the telemetry was very difficult to tell whether it is artifact versus true nonsustained VT as the patient was asymptomatic at the time. Given the significant motion artifact at the time, it is likely artifact.  She underwent a scheduled cardiac catheterization on 12/20/2014 and noted small to moderate apical infarction due to distal LAD stenosis with reestablished TIMI grade 2 flow. Medical therapy was recommended. She was loaded with Plavix prior to discharge. She was seen in the morning of 12/21/2014, at which time she was doing well without significant chest discomfort or shortness of breath. Her hemoglobin A1c was 5.6, and according to the patient she has not been taking her metformin at home as she has been trying to manage it with diet and exercise with good effect. I will defer to her PCP regarding further management. I have restarted her on losartan hydrochlorothiazide which she has been taking at home. She will need to continue on aspirin, Plavix and statin for risk factor management. She is deemed stable for discharge from cardiology perspective. I will arrange 2-4 weeks follow-up with Dr. Marlou Porch' office.   Discharge Vitals Blood pressure 161/78, pulse 55, temperature 98.2 F (36.8 C), temperature source Oral, resp. rate 18, height '5\' 2"'  (1.575 m), weight 205 lb (92.987 kg), SpO2 97 %.  Filed Weights   12/19/14 2253 12/21/14 0611  Weight: 202 lb (91.627 kg) 205 lb (92.987 kg)    Labs  CBC  Recent Labs  12/20/14 0528 12/21/14 0315  WBC 8.5 7.8  HGB 10.7* 10.8*  HCT 32.6* 33.2*  MCV 92.4 92.7  PLT 229 774   Basic Metabolic Panel  Recent Labs  12/20/14 0528 12/21/14 0315  NA 138 139  K 3.8 3.9  CL 108 107  CO2 24 23  GLUCOSE 115* 106*    BUN 13 13  CREATININE 0.78 0.86  CALCIUM 8.7* 8.6*   Cardiac Enzymes  Recent Labs  12/19/14 2350 12/20/14 0528 12/20/14 1040  TROPONINI 13.36* 30.01* 15.00*   Hemoglobin A1C  Recent Labs  12/19/14 2350  HGBA1C 5.6   Fasting Lipid Panel  Recent Labs  12/20/14 0528  CHOL 149  HDL 43  LDLCALC 78  TRIG 138  CHOLHDL 3.5   Thyroid Function Tests  Recent Labs  12/19/14 2350  TSH 0.376    Disposition  Pt is being discharged home today in good condition.  Follow-up Plans & Appointments      Follow-up Information    Follow up with Truitt Merle, NP On 01/04/2015.   Specialties:  Nurse Practitioner, Interventional Cardiology, Cardiology, Radiology   Why:  10:00am   Contact information:   [place] [place] [address] [phone]       Discharge Medications    Medication List    TAKE these medications        aspirin 81 MG EC tablet  Take 1 tablet (81 mg total) by mouth daily.     atorvastatin 80 MG tablet  Commonly known as:  LIPITOR  Take 1 tablet (80 mg total) by mouth daily at 6 PM.     cholecalciferol 1000 UNITS tablet  Commonly known as:  VITAMIN D  Take 2,000 Units by mouth daily.     clopidogrel 75 MG tablet  Commonly known as:  PLAVIX  Take 1 tablet (75 mg total) by mouth daily.  Start taking on:  12/22/2014     fluticasone 50 MCG/ACT nasal spray  Commonly known as:  FLONASE  Place 2 sprays into both nostrils daily.     glucose blood test strip  Commonly known as:  FREESTYLE TEST STRIPS  Use as instructed One touch ultra 2     glucose monitoring kit monitoring kit  1 each by Does not apply route as needed for other. One touch ultra 2     losartan-hydrochlorothiazide 100-25 MG per tablet  Commonly known as:  HYZAAR  Take 1 tablet by mouth daily.     metFORMIN 500 MG tablet  Commonly known as:  GLUCOPHAGE  Take 1 tablet (500 mg total) by mouth daily with breakfast.     montelukast 10 MG tablet   Commonly known as:  SINGULAIR  TAKE 1 TABLET (10 MG TOTAL) BY MOUTH AT BEDTIME.     nitroGLYCERIN 0.4 MG SL tablet  Commonly known as:  NITROSTAT  Place 1 tablet (0.4 mg total) under the tongue every 5 (five) minutes x 3 doses as needed for chest pain.     onetouch ultrasoft lancets  Use as instructed One touch ultra 2     zoster vaccine live (PF) 19400 UNT/0.65ML injection  Commonly known as:  ZOSTAVAX  Inject 19,400 Units into the skin once.         Duration of Discharge Encounter   Greater than 30 minutes including physician time.  Hilbert Corrigan PA-C Pager: [phone] 12/21/2014, 11:49 AM

## 2014-12-21 NOTE — Progress Notes (Signed)
  Echocardiogram 2D Echocardiogram has been performed.  Elaine Jackson M 12/21/2014, 12:35 PM

## 2014-12-21 NOTE — Telephone Encounter (Signed)
TCM per [facility] 7/13 @ 10am w/ Cecille Rubin

## 2014-12-21 NOTE — Progress Notes (Signed)
Primary Cardiologist: new -Dr. Marlou Porch Patient Profile: 59 yo woman with PMH of T2DM controlled previously on oral therapies, dyslipidemia, hypertension, family history of CAD/MI in mother/father, admitted for jaw pain with abnormal EKG. Ruled in for NSTEMI with troponin peak of 30.  Subjective: Feeling well today, no chest pain or shortness of breath. Still has a mild headache but says she has a history of migraines. Tolerating activity well.   Objective: Vital signs in last 24 hours: Temp:  [97.7 F (36.5 C)-98.7 F (37.1 C)] 98.2 F (36.8 C) (06/29 4193) Pulse Rate:  [0-91] 55 (06/29 0611) Resp:  [0-109] 18 (06/29 0611) BP: (113-174)/(65-114) 145/85 mmHg (06/29 0611) SpO2:  [0 %-100 %] 97 % (06/29 0611) Weight:  [92.987 kg (205 lb)] 92.987 kg (205 lb) (06/29 0611) Last BM Date: 12/20/14  Intake/Output from previous day: 06/28 0701 - 06/29 0700 In: 50 [I.V.:50] Out: 1 [Stool:1] Intake/Output this shift:    Medications Current Facility-Administered Medications  Medication Dose Route Frequency Provider Last Rate Last Dose  . 0.9 %  sodium chloride infusion  250 mL Intravenous PRN Mihai Croitoru, MD      . acetaminophen (TYLENOL) tablet 650 mg  650 mg Oral Q4H PRN Jules Husbands, MD   650 mg at 12/20/14 2044  . aspirin EC tablet 81 mg  81 mg Oral Daily Jules Husbands, MD   81 mg at 12/21/14 7902  . atorvastatin (LIPITOR) tablet 80 mg  80 mg Oral q1800 Jules Husbands, MD   80 mg at 12/20/14 1751  . morphine 2 MG/ML injection 2-4 mg  2-4 mg Intravenous Q3H PRN Jules Husbands, MD      . nitroGLYCERIN (NITROSTAT) SL tablet 0.4 mg  0.4 mg Sublingual Q5 Min x 3 PRN Jules Husbands, MD      . nitroGLYCERIN 50 mg in dextrose 5 % 250 mL (0.2 mg/mL) infusion  0-200 mcg/min Intravenous Titrated Jules Husbands, MD   Stopped at 12/20/14 2135  . ondansetron (ZOFRAN) injection 4 mg  4 mg Intravenous Q6H PRN Jules Husbands, MD      . sodium chloride 0.9 % injection 3 mL  3 mL Intravenous Q12H Mihai Croitoru, MD    3 mL at 12/21/14 0729  . sodium chloride 0.9 % injection 3 mL  3 mL Intravenous PRN Sanda Klein, MD        PE: General appearance: alert, cooperative, appears stated age and no distress Neck: no adenopathy, no carotid bruit, no JVD, supple, symmetrical, trachea midline and thyroid not enlarged, symmetric, no tenderness/mass/nodules Lungs: clear to auscultation bilaterally Heart: regular rate and rhythm, S1, S2 normal, click, rub or gallop and bradycardic. 1/6 systolic murmur Abdomen: soft, non-tender; bowel sounds normal; no masses,  no organomegaly Extremities: extremities normal, atraumatic, no cyanosis or edema Pulses: 2+ and symmetric Skin: Skin color, texture, turgor normal. No rashes or lesions Neurologic: Grossly normal  Lab Results:   Recent Labs  12/19/14 1953 12/20/14 0528 12/21/14 0315  WBC 11.2* 8.5 7.8  HGB 11.4* 10.7* 10.8*  HCT 34.5* 32.6* 33.2*  PLT 252 229 223   BMET  Recent Labs  12/19/14 1953 12/20/14 0528 12/21/14 0315  NA 140 138 139  K 4.4 3.8 3.9  CL 107 108 107  CO2 25 24 23   GLUCOSE 129* 115* 106*  BUN 13 13 13   CREATININE 0.83 0.78 0.86  CALCIUM 9.1 8.7* 8.6*   PT/INR  Recent Labs  12/19/14 1953  LABPROT 14.0  INR 1.06   Cholesterol  Recent  Labs  12/20/14 0528  CHOL 149   Telemetry SB, lowest rate 46. No notable ectopy.    Assessment/Plan Principal Problem:   NSTEMI (non-ST elevated myocardial infarction) Active Problems:   Diabetes mellitus type II, controlled   Essential hypertension, benign   Dyslipidemia   1. NSTEMI:  - Troponin peaked at 30.  - LHC on 6/28 showed small to moderate apical infarction due to distal LAD stenosis with reestablished TIMI2 flow. No PCI. Recommended plavix for 1 yr and ASA for life.   - Unable to tolerate BB due to bradycardia - Continue atorvastatin 80 mg daily - restarting home dose of losartan/HCTZ today - per Dr. Victorino December note DAPT for at least one yr, however did not see any  loading dose of plavix. Will give 300mg  plavix and start 75mg  daily tomorrow.  - stable for discharge today  2. HTN: - Starting lisinopril 5 mg today  3. HLD: - Continue atorvastatin 80 mg daily  4. Type II diabetes: - Controlled at home by diet. Not taking antidiabetic agents.  - HgbA1c 5.6.  - per pt, she has not been taking metformin even though it is listed as her home med, as her blood sugar has been relatively controlled with diet and exercise. Either way, she will need to hold metformin for at least 48 hrs after cath, i will defer to her PCP regarding management of DM  Dispo: DC home today. Follow up in 2-4 weeks.    LOS: 2 days   Hervey Ard, NP student. Patient seen together with Almyra Deforest PA-C, all treatment has been discussed. Physical exam performed separately   12/21/2014 8:16 AM  Signed, Almyra Deforest PA Pager: [phone] Personally seen and examined. Agree with above. Agree with DC See above.   Candee Furbish, MD

## 2014-12-21 NOTE — Telephone Encounter (Signed)
TCM call.  Pt to be discharged from [facility] today 6/29 per discharge note.  Triage to place a TCM call on 6/30 to the pt.

## 2014-12-21 NOTE — Clinical Documentation Improvement (Signed)
Presents with NSTEMI; was cath'd.   Nurse's Note on 6/28 notes run of VTach with patient asymptomatic.  Please clarify if you agree with diagnosis and document findings in next progress note and include in discharge summary if applicable. _______Other Condition__________________ _______Cannot Clinically Determine   Thank You, Zoila Shutter ,RN Clinical Documentation Specialist:  [facility] Information Management

## 2014-12-21 NOTE — Research (Signed)
DAL-GENE Informed Consent   Subject Name: Elaine Jackson  Subject met inclusion and exclusion criteria.  The informed consent form, study requirements and expectations were reviewed with the subject and questions and concerns were addressed prior to the signing of the consent form.  The subject verbalized understanding of the trail requirements.  The subject agreed to participate in the DAL-GENE trial and signed the informed consent.  The informed consent was obtained prior to performance of any protocol-specific procedures for the subject.  A copy of the signed informed consent was given to the subject and a copy was placed in the subject's medical record.  Hedrick,Bram Hottel W 12/21/2014, 0830

## 2014-12-21 NOTE — Discharge Instructions (Signed)
Acute Coronary Syndrome  Acute coronary syndrome (ACS) is an urgent problem in which the blood and oxygen supply to the heart is critically deficient. ACS requires hospitalization because one or more coronary arteries may be blocked.  ACS represents a range of conditions including:  · Previous angina that is now unstable, lasts longer, happens at rest, or is more intense.  · A heart attack, with heart muscle cell injury and death.  There are three vital coronary arteries that supply the heart muscle with blood and oxygen so that it can pump blood effectively. If blockages to these arteries develop, blood flow to the heart muscle is reduced. If the heart does not get enough blood, angina may occur as the first warning sign.  SYMPTOMS   · The most common signs of angina include:  ¨ Tightness or squeezing in the chest.  ¨ Feeling of heaviness on the chest.  ¨ Discomfort in the arms, neck, back, or jaw.  ¨ Shortness of breath and nausea.  ¨ Cold, wet skin.  · Angina is usually brought on by physical effort or excitement which increase the oxygen needs of the heart. These states increase the blood flow needs of the heart beyond what can be delivered.  · Other symptoms that are not as common include:  ¨ Fatigue  ¨ Unexplained feelings of nervousness or anxiety  ¨ Weakness  ¨ Diarrhea  · Sometimes, you may not have noticed any symptoms at all but still suffered a cardiac injury.  TREATMENT   · Medicines to help discomfort may include nitroglycerin (nitro) in the form of tablets or a spray for rapid relief, or longer-acting forms such as cream, patches, or capsules. (Be aware that there are many side effects and possible interactions with other drugs).  · Other medicines may be used to help the heart pump better.  · Procedures to open blocked arteries including angioplasty or stent placement to keep the arteries open.  · Open heart surgery may be needed when there are many blockages or they are in critical locations that  are best treated with surgery.  HOME CARE INSTRUCTIONS   · Do not use any tobacco products including cigarettes, chewing tobacco, or electronic cigarettes.  · Take one baby or adult aspirin daily, if your health care provider advises. This helps reduce the risk of a heart attack.  · It is very important that you follow the angina treatment prescribed by your health care provider. Make arrangements for proper follow-up care.  · Eat a heart healthy diet with salt and fat restrictions as advised.  · Regular exercise is good for you as long as it does not cause discomfort. Do not begin any new type of exercise until you check with your health care provider.  · If you are overweight, you should lose weight.  · Try to maintain normal blood lipid levels.  · Keep your blood pressure under control as recommended by your health care provider.  · You should tell your health care provider right away about any increase in the severity or frequency of your chest discomfort or angina attacks. When you have angina, you should stop what you are doing and sit down. This may bring relief in 3 to 5 minutes. If your health care provider has prescribed nitro, take it as directed.  · If your health care provider has given you a follow-up appointment, it is very important to keep that appointment. Not keeping the appointment could result in a chronic or   permanent injury, pain, and disability. If there is any problem keeping the appointment, you must call back to this facility for assistance.  SEEK IMMEDIATE MEDICAL CARE IF:   · You develop nausea, vomiting, or shortness of breath.  · You feel faint, lightheaded, or pass out.  · Your chest discomfort gets worse.  · You are sweating or experience sudden profound fatigue.  · You do not get relief of your chest pain after 3 doses of nitro.  · Your discomfort lasts longer than 15 minutes.  MAKE SURE YOU:   · Understand these instructions.  · Will watch your condition.  · Will get help right  away if you are not doing well or get worse.  · Take all medicines as directed by your health care provider.  Document Released: 06/10/2005 Document Revised: 06/15/2013 Document Reviewed: 10/12/2013  ExitCare® Patient Information ©2015 [employer]. This information is not intended to replace advice given to you by your health care provider. Make sure you discuss any questions you have with your health care provider.

## 2014-12-21 NOTE — Progress Notes (Signed)
CARDIAC REHAB PHASE I   PRE:  Rate/Rhythm: 58 SB  BP:  Supine: 161/78  Sitting:  Standing:    SaO2:   MODE:  Ambulation: 550 ft   POST:  Rate/Rhythm: 79 SR  BP:  Supine:   Sitting: 173/82  Standing:    SaO2:  1005-1055 Pt walked 550 ft with steady gait. Tolerated well. No CP. MI education completed with pt and family who voiced understanding. Gave heart healthy and diabetic diets. Discussed CRP 2 and pt gave permission to refer to [facility] program.   Graylon Good, RN BSN  12/21/2014 10:52 AM

## 2014-12-22 NOTE — Telephone Encounter (Signed)
Patient contacted regarding discharge from [facility] on 12/21/14.  Patient understands to follow up with provider Kathrene Alu on 01/04/15 at 13 at [facility].. Patient understands discharge instructions? yes Patient understands medications and regiment? yes Patient understands to bring all medications to this visit? yes  States she feels great.  (R) radial wrist used for cath.  States no redness or edema noted.  Verbalizes understanding regarding meds, diet and activity. States she has had lap band surgery so is use to a restricted diet. No questions or concerns.  Will call if has questions.

## 2014-12-27 ENCOUNTER — Telehealth: Payer: Self-pay | Admitting: *Deleted

## 2014-12-27 NOTE — Telephone Encounter (Signed)
Called patient to inform her of lab results related to [place] study being negative and therefore disqualifying patient for enrolling in study. Patient verbalized understanding.

## 2015-01-04 ENCOUNTER — Encounter: Payer: Self-pay | Admitting: Nurse Practitioner

## 2015-01-04 ENCOUNTER — Ambulatory Visit (INDEPENDENT_AMBULATORY_CARE_PROVIDER_SITE_OTHER): Payer: BLUE CROSS/BLUE SHIELD | Admitting: Nurse Practitioner

## 2015-01-04 VITALS — BP 100/70 | HR 78 | Resp 18 | Ht 62.0 in | Wt 198.0 lb

## 2015-01-04 DIAGNOSIS — I10 Essential (primary) hypertension: Secondary | ICD-10-CM

## 2015-01-04 DIAGNOSIS — E785 Hyperlipidemia, unspecified: Secondary | ICD-10-CM | POA: Diagnosis not present

## 2015-01-04 DIAGNOSIS — Z9889 Other specified postprocedural states: Secondary | ICD-10-CM | POA: Diagnosis not present

## 2015-01-04 DIAGNOSIS — I259 Chronic ischemic heart disease, unspecified: Secondary | ICD-10-CM

## 2015-01-04 LAB — BASIC METABOLIC PANEL
BUN: 19 mg/dL (ref 6–23)
CO2: 28 mEq/L (ref 19–32)
Calcium: 10 mg/dL (ref 8.4–10.5)
Chloride: 103 mEq/L (ref 96–112)
Creatinine, Ser: 0.95 mg/dL (ref 0.40–1.20)
GFR: 63.82 mL/min (ref >60.00)
Glucose, Bld: 92 mg/dL (ref 70–99)
Potassium: 3.9 mEq/L (ref 3.5–5.1)
Sodium: 139 mEq/L (ref 135–145)

## 2015-01-04 LAB — CBC
HCT: 38.4 % (ref 36.0–46.0)
Hemoglobin: 12.9 g/dL (ref 12.0–15.0)
MCHC: 33.5 g/dL (ref 30.0–36.0)
MCV: 91.9 fl (ref 78.0–100.0)
Platelets: 330 10*3/uL (ref 150.0–400.0)
RBC: 4.18 Mil/uL (ref 3.87–5.11)
RDW: 12.8 % (ref 11.5–15.5)
WBC: 8.1 10*3/uL (ref 4.0–10.5)

## 2015-01-04 NOTE — Patient Instructions (Addendum)
We will be checking the following labs today - BMET & CBC   Medication Instructions:    Continue with your current medicines.     Testing/Procedures To Be Arranged:  N/A  Follow-Up:   See Dr. Marlou Porch in 6 weeks with fasting labs.     Other Special Instructions:   Monitor BP at home - call us if consistently below 961 systolic (top #)  Call the [place] office at [phone] if you have any questions, problems or concerns.

## 2015-01-04 NOTE — Progress Notes (Signed)
CARDIOLOGY OFFICE NOTE  Date:  01/04/2015    Elaine Jackson Date of Birth: 06-23-1956 Medical Record #[id number]  PCP:  Reginia Forts, MD  Cardiologist:  [facility]    Chief Complaint  Patient presents with  . Coronary Artery Disease    Post hospital visit - seen for Dr. Marlou Porch    History of Present Illness: Elaine Jackson is a 59 y.o. female who presents today for a TOC/post hospital visit. Seen for Dr. Marlou Porch.   She has a past medical history of type 2 DM on PRN metformin and been controlled mainly with diet and exercise, HTN, HLD, family history of CAD/MI. She is obese. She has had prior lap banding surgery.  She presented to [facility] on 12/19/2014 for headache, jaw pain which started around 11:30 AM. The symptom was associated with diaphoresis. Patient initially did not try to seek medical attention. However she eventually went to urgent care around 4:30 PM that evening and was found to have abnormal EKG with subtle ST elevation in the continued improving symptom. She was referred to [facility]. On arrival, her symptom has largely improved with 4-5 out of 10 jaw discomfort and continued posterior headache. EKG showed minimal ST elevation in inferior lead and some ST depression in the anterior lead without reciprocal changes. She was placed on IV heparin. Overnight, her serial troponin went up to 30, before trending back down. We were not unable to add metoprolol given baseline bradycardia. Although nursing staff did note an episode of nonsustained VT on telemetry on 6/28, further review of the telemetry was very difficult to tell whether it is artifact versus true nonsustained VT as the patient was asymptomatic at the time. Given the significant motion artifact at the time, it is likely artifact.  She underwent a scheduled cardiac catheterization on 12/20/2014 and noted small to moderate apical infarction due to distal LAD stenosis with reestablished TIMI grade 2 flow.  Medical therapy was recommended. She was loaded with Plavix prior to discharge.   Comes back today. Here alone today. Doing ok. No real problems since discharge. Blood sugars remain stable and she is NOT taking her metformin. Does not check BP at home. Little soft here but asymptomatic. She does not normally check routinely. Admits she is pretty anxious. No recurrent jaw pain - never had chest pain. Has returned to work. Walking 20 minutes a day without issue.   Past Medical History  Diagnosis Date  . Hypertension   . Diabetes mellitus   . Hyperlipidemia   . Family history of breast cancer in first degree relative     sister age 18  . Sleep related leg cramps   . Allergic rhinitis, cause unspecified   . Obesity, unspecified   . Unspecified vitamin D deficiency   . Other chronic nonalcoholic liver disease   . Chicken pox   . Measles   . Malignant neoplasm skin of face 02/16/2007    Basal Cell Carcinoma  Tafeen.  . Allergy   . Heart murmur   . CAD (coronary artery disease)     LHC 12/20/2014 showed small to moderate apical infarction due to distal LAD stenosis with reestablished TIMI2 flow. No PCI. Recommended plavix for 1 yr and ASA for life.     Past Surgical History  Procedure Laterality Date  . Laparoscopic gastric banding  07/03/10    Dr. Sherrin Daisy; [facility]  . Cholecystectomy    . Tonsillectomy    . Colonoscopy  06/25/2007    Hung. Normal. Repeat in ten years.  . Cardiac catheterization N/A 12/20/2014    Procedure: Left Heart Cath and Coronary Angiography;  Surgeon: Sanda Klein, MD;  Location: [facility];  Service: Cardiovascular;  Laterality: N/A;     Medications: Current Outpatient Prescriptions  Medication Sig Dispense Refill  . aspirin EC 81 MG EC tablet Take 1 tablet (81 mg total) by mouth daily.    Marland Kitchen atorvastatin (LIPITOR) 80 MG tablet Take 1 tablet (80 mg total) by mouth daily at 6 PM. 30 tablet 11  . cholecalciferol (VITAMIN D) 1000 UNITS tablet  Take 2,000 Units by mouth daily.    . clopidogrel (PLAVIX) 75 MG tablet Take 1 tablet (75 mg total) by mouth daily. 90 tablet 3  . fluticasone (FLONASE) 50 MCG/ACT nasal spray Place 2 sprays into both nostrils daily. (Patient taking differently: Place 2 sprays into both nostrils as needed for allergies. ) 16 g 11  . glucose blood (FREESTYLE TEST STRIPS) test strip Use as instructed One touch ultra 2 100 each 12  . glucose monitoring kit (FREESTYLE) monitoring kit 1 each by Does not apply route as needed for other. One touch ultra 2 1 each 0  . Lancets (ONETOUCH ULTRASOFT) lancets Use as instructed One touch ultra 2 100 each 12  . losartan-hydrochlorothiazide (HYZAAR) 100-25 MG per tablet Take 1 tablet by mouth daily. 90 tablet 3  . montelukast (SINGULAIR) 10 MG tablet TAKE 1 TABLET (10 MG TOTAL) BY MOUTH AT BEDTIME. (Patient taking differently: Take 10 mg by mouth every morning. ) 90 tablet 3  . zoster vaccine live, PF, (ZOSTAVAX) 25053 UNT/0.65ML injection Inject 19,400 Units into the skin once. 0.65 mL 0  . metFORMIN (GLUCOPHAGE) 500 MG tablet Take 1 tablet (500 mg total) by mouth daily with breakfast. (Patient not taking: Reported on 01/04/2015) 90 tablet 3  . nitroGLYCERIN (NITROSTAT) 0.4 MG SL tablet Place 1 tablet (0.4 mg total) under the tongue every 5 (five) minutes x 3 doses as needed for chest pain. (Patient not taking: Reported on 01/04/2015) 25 tablet 3   No current facility-administered medications for this visit.    Allergies: Allergies  Allergen Reactions  . Sulfa Antibiotics Nausea And Vomiting    Social History: The patient  reports that she has quit smoking. Her smoking use included Cigarettes. She has a 25 pack-year smoking history. She has never used smokeless tobacco. She reports that she does not drink alcohol or use illicit drugs.   Family History: The patient's family history includes Arthritis in her brother; Cancer in her sister; Cancer (age of onset: 36) in her  sister; Dementia in her mother; Diabetes in her brother, father, mother, and sister; Heart disease in her father, maternal grandfather, mother, paternal grandfather, and paternal grandmother; Hyperlipidemia in her brother, father, and mother; Hypertension in her brother, father, mother, sister, and sister; Kidney disease in her father and sister; Stroke in her father, maternal grandmother, paternal grandfather, and paternal grandmother.   Review of Systems: Please see the history of present illness.   Otherwise, the review of systems is positive for easy bruising and anxiety.   All other systems are reviewed and negative.   Physical Exam: VS:  BP 100/70 mmHg  Pulse 78  Resp 18  Ht '5\' 2"'  (1.575 m)  Wt 198 lb (89.812 kg)  BMI 36.21 kg/m2  SpO2 98% .  BMI Body mass index is 36.21 kg/(m^2).  Wt Readings from Last 3 Encounters:  01/04/15 198  lb (89.812 kg)  12/21/14 205 lb (92.987 kg)  12/19/14 202 lb (91.627 kg)    General: Pleasant. Well developed, well nourished and in no acute distress. She is obese. Weight is down 7 pounds.  HEENT: Normal. Neck: Supple, no JVD, carotid bruits, or masses noted.  Cardiac: Regular rate and rhythm. No murmurs, rubs, or gallops. No edema.  Respiratory:  Lungs are clear to auscultation bilaterally with normal work of breathing.  GI: Soft and nontender.  MS: No deformity or atrophy. Gait and ROM intact. Skin: Warm and dry. Color is normal.  Neuro:  Strength and sensation are intact and no gross focal deficits noted.  Psych: Alert, appropriate and with normal affect.   LABORATORY DATA:  EKG:  EKG is not ordered today.  Lab Results  Component Value Date   WBC 7.8 12/21/2014   HGB 10.8* 12/21/2014   HCT 33.2* 12/21/2014   PLT 223 12/21/2014   GLUCOSE 106* 12/21/2014   CHOL 149 12/20/2014   TRIG 138 12/20/2014   HDL 43 12/20/2014   LDLCALC 78 12/20/2014   ALT 13 12/12/2014   AST 14 12/12/2014   NA 139 12/21/2014   K 3.9 12/21/2014   CL 107  12/21/2014   CREATININE 0.86 12/21/2014   BUN 13 12/21/2014   CO2 23 12/21/2014   TSH 0.376 12/19/2014   INR 1.06 12/19/2014   HGBA1C 5.6 12/19/2014   MICROALBUR 0.3 12/12/2014   Lab Results  Component Value Date   CKTOTAL 87 02/02/2014   TROPONINI 15.00* 12/20/2014    BNP (last 3 results)  Recent Labs  12/19/14 1953 12/19/14 2350  BNP 155.1* 213.5*    ProBNP (last 3 results) No results for input(s): PROBNP in the last 8760 hours.   Other Studies Reviewed Today: Cardiac catheterization Conclusion     Dist LAD lesion, 90% stenosed.  Apical infarction with preserved overall LVEF  IMPRESSIONS:  Single vessel CAD. Small to moderate apical infarction due to a distal LAD stenosis with reestablished (TIMI2) flow   RECOMMENDATION:  Medical therapy with statin, lifelong ASA, dual antiplatelet therapy for 12 months.  Prefer ARB/ACEi for BP control.  Beta blockers would be helpful, but precluded by baseline bradycardia.  Aggressive control of risk factors, especially DM.          Echo Study Conclusions  - Left ventricle: The cavity size was normal. There was mild focal basal hypertrophy of the septum. Systolic function was normal. The estimated ejection fraction was in the range of 60% to 65%. Wall motion was normal; there were no regional wall motion abnormalities. Doppler parameters are consistent with abnormal left ventricular relaxation (grade 1 diastolic dysfunction). Doppler parameters are consistent with elevated ventricular end-diastolic filling pressure. - Aortic valve: Trileaflet; normal thickness leaflets. There was mild regurgitation. - Aortic root: The aortic root was normal in size. - Left atrium: The atrium was mildly dilated. - Right ventricle: The cavity size was normal. Wall thickness was normal. Systolic function was normal. - Right atrium: The atrium was normal in size. - Tricuspid valve: There was mild  regurgitation. - Pulmonary arteries: Systolic pressure was mildly increased. PA peak pressure: 44 mm Hg (S). - Inferior vena cava: The vessel was dilated. The respirophasic diameter changes were blunted (< 50%), consistent with elevated central venous pressure. - Pericardium, extracardiac: A trivial pericardial effusion was identified.   Assessment/Plan: 1. Apical MI - to manage medically. She is doing well without symptoms. Continue with current regimen.  2. HTN - BP pretty  soft - she will start monitoring at home and call us if staying below 817 systolic - I would favor cutting her ARB/diuretic in half if needed.  3. HLD - on statin  4. DM - managing with diet/exercise/weight  Current medicines are reviewed with the patient today.  The patient does not have concerns regarding medicines other than what has been noted above.  The following changes have been made:  See above.  Labs/ tests ordered today include:    Orders Placed This Encounter  Procedures  . Basic metabolic panel  . CBC     Disposition:   FU with Dr. Marlou Porch in 6 weeks with fasting labs.   Patient is agreeable to this plan and will call if any problems develop in the interim.   Signed: Burtis Junes, RN, ANP-C 01/04/2015 10:37 AM  [facility] [address] [place], [place]  [address] Phone: [phone] Fax: [phone]

## 2015-01-23 DIAGNOSIS — I219 Acute myocardial infarction, unspecified: Secondary | ICD-10-CM

## 2015-01-23 HISTORY — DX: Acute myocardial infarction, unspecified: I21.9

## 2015-01-24 ENCOUNTER — Telehealth (HOSPITAL_COMMUNITY): Payer: Self-pay | Admitting: Cardiac Rehabilitation

## 2015-01-24 NOTE — Telephone Encounter (Signed)
PC to pt to discuss enrolling in cardiac rehab program.  Pt is undecided and uncommitted. She is walking on her own and is concerned about inconvenience of class times with her need to help with her grandchildren.  Signed order pending, multiple faxes sent to Dr. Recardo Evangelist and Dr. Marlou Porch.  Pt states she will call us if she decides to schedule.  Will refax order at that time.

## 2015-02-24 ENCOUNTER — Ambulatory Visit (INDEPENDENT_AMBULATORY_CARE_PROVIDER_SITE_OTHER): Payer: BLUE CROSS/BLUE SHIELD | Admitting: Cardiology

## 2015-02-24 ENCOUNTER — Other Ambulatory Visit: Payer: BLUE CROSS/BLUE SHIELD

## 2015-02-24 ENCOUNTER — Encounter: Payer: Self-pay | Admitting: Cardiology

## 2015-02-24 VITALS — BP 122/64 | HR 63 | Ht 62.5 in | Wt 197.4 lb

## 2015-02-24 DIAGNOSIS — E7800 Pure hypercholesterolemia, unspecified: Secondary | ICD-10-CM

## 2015-02-24 DIAGNOSIS — I214 Non-ST elevation (NSTEMI) myocardial infarction: Secondary | ICD-10-CM

## 2015-02-24 DIAGNOSIS — Z79899 Other long term (current) drug therapy: Secondary | ICD-10-CM

## 2015-02-24 DIAGNOSIS — I10 Essential (primary) hypertension: Secondary | ICD-10-CM | POA: Diagnosis not present

## 2015-02-24 DIAGNOSIS — E669 Obesity, unspecified: Secondary | ICD-10-CM

## 2015-02-24 LAB — ALT: ALT: 17 U/L (ref 0–35)

## 2015-02-24 NOTE — Progress Notes (Signed)
CARDIOLOGY OFFICE NOTE  Date:  02/24/2015    [name] Date of Birth: [birth year] Medical Record #[id number]  PCP:  Reginia Forts, MD  Cardiologist:  Marlou Porch    No chief complaint on file.   History of Present Illness: Elaine Jackson is a 59 y.o. female who is post apical MI with 90% distal LAD lesion, no PCI, 12/20/14 med mgt. DAPT 12 months.   She has a past medical history of type 2 DM on PRN metformin and been controlled mainly with diet and exercise, HTN, HLD, family history of CAD/MI. She is obese. She has had prior lap banding surgery.  She presented to [facility] on 12/19/2014 for headache, jaw pain which started around 11:30 AM. The symptom was associated with diaphoresis. Patient initially did not try to seek medical attention. However she eventually went to urgent care around 4:30 PM that evening and was found to have abnormal EKG with subtle ST elevation in the continued improving symptom. She was referred to [facility]. On arrival, her symptom has largely improved with 4-5 out of 10 jaw discomfort and continued posterior headache. EKG showed minimal ST elevation in inferior lead and some ST depression in the anterior lead without reciprocal changes. She was placed on IV heparin. Overnight, her serial troponin went up to 30, before trending back down. We were not unable to add metoprolol given baseline bradycardia. Although nursing staff did note an episode of nonsustained VT on telemetry on 6/28, further review of the telemetry was very difficult to tell whether it is artifact versus true nonsustained VT as the patient was asymptomatic at the time. Given the significant motion artifact at the time, it is likely artifact.  She underwent a scheduled cardiac catheterization on 12/20/2014 and noted small to moderate apical infarction due to distal LAD stenosis with reestablished TIMI grade 2 flow. Medical therapy was recommended. She was loaded with Plavix prior to  discharge.   Comes back today. Here alone today. Doing ok.Still does not feel perfect. Breath deep feels better. Feels like throat is dried up at times. Jaw clincher.  No real problems since discharge. Blood sugars remain stable. Has lap band. Weight loss. Trying walk. Does not check BP at home. Little soft here but asymptomatic. She does not normally check routinely. Admits she is pretty anxious. Has returned to work. Walking 20 minutes a day without issue.   Past Medical History  Diagnosis Date  . Hypertension   . Diabetes mellitus   . Hyperlipidemia   . Family history of breast cancer in first degree relative     sister age 67  . Sleep related leg cramps   . Allergic rhinitis, cause unspecified   . Obesity, unspecified   . Unspecified vitamin D deficiency   . Other chronic nonalcoholic liver disease   . Chicken pox   . Measles   . Malignant neoplasm skin of face 02/16/2007    Basal Cell Carcinoma  Tafeen.  . Allergy   . Heart murmur   . CAD (coronary artery disease)     LHC 12/20/2014 showed small to moderate apical infarction due to distal LAD stenosis with reestablished TIMI2 flow. No PCI. Recommended plavix for 1 yr and ASA for life.     Past Surgical History  Procedure Laterality Date  . Laparoscopic gastric banding  07/03/10    Dr. Sherrin Daisy; [facility]  . Cholecystectomy    . Tonsillectomy    . Colonoscopy  06/25/2007  Hung. Normal. Repeat in ten years.  . Cardiac catheterization N/A 12/20/2014    Procedure: Left Heart Cath and Coronary Angiography;  Surgeon: Sanda Klein, MD;  Location: [facility];  Service: Cardiovascular;  Laterality: N/A;     Medications: Current Outpatient Prescriptions  Medication Sig Dispense Refill  . aspirin EC 81 MG EC tablet Take 1 tablet (81 mg total) by mouth daily.    Marland Kitchen atorvastatin (LIPITOR) 80 MG tablet Take 1 tablet (80 mg total) by mouth daily at 6 PM. 30 tablet 11  . cholecalciferol (VITAMIN D) 1000 UNITS tablet Take  2,000 Units by mouth daily.    . clopidogrel (PLAVIX) 75 MG tablet Take 1 tablet (75 mg total) by mouth daily. 90 tablet 3  . fluticasone (FLONASE) 50 MCG/ACT nasal spray Place into both nostrils as needed for allergies or rhinitis.    Marland Kitchen glucose blood (FREESTYLE TEST STRIPS) test strip Use as instructed One touch ultra 2 100 each 12  . glucose monitoring kit (FREESTYLE) monitoring kit 1 each by Does not apply route as needed for other. One touch ultra 2 1 each 0  . Lancets (ONETOUCH ULTRASOFT) lancets Use as instructed One touch ultra 2 100 each 12  . losartan-hydrochlorothiazide (HYZAAR) 100-25 MG per tablet Take 1 tablet by mouth daily. 90 tablet 3  . montelukast (SINGULAIR) 10 MG tablet TAKE 1 TABLET (10 MG TOTAL) BY MOUTH AT BEDTIME. (Patient taking differently: Take 10 mg by mouth. ) 90 tablet 3  . nitroGLYCERIN (NITROSTAT) 0.4 MG SL tablet Place 1 tablet (0.4 mg total) under the tongue every 5 (five) minutes x 3 doses as needed for chest pain. 25 tablet 3  . metFORMIN (GLUCOPHAGE) 500 MG tablet Take 1 tablet (500 mg total) by mouth daily with breakfast. (Patient not taking: Reported on 01/04/2015) 90 tablet 3  . zoster vaccine live, PF, (ZOSTAVAX) 30865 UNT/0.65ML injection Inject 19,400 Units into the skin once. (Patient not taking: Reported on 02/24/2015) 0.65 mL 0   No current facility-administered medications for this visit.    Allergies: Allergies  Allergen Reactions  . Sulfa Antibiotics Nausea And Vomiting    Social History: The patient  reports that she has quit smoking. Her smoking use included Cigarettes. She has a 25 pack-year smoking history. She has never used smokeless tobacco. She reports that she does not drink alcohol or use illicit drugs.   Family History: The patient's family history includes Arthritis in her brother; Cancer in her sister; Cancer (age of onset: 74) in her sister; Dementia in her mother; Diabetes in her brother, father, mother, and sister; Heart disease  in her father, maternal grandfather, mother, paternal grandfather, and paternal grandmother; Hyperlipidemia in her brother, father, and mother; Hypertension in her brother, father, mother, sister, and sister; Kidney disease in her father and sister; Stroke in her father, maternal grandmother, paternal grandfather, and paternal grandmother.   Review of Systems: Please see the history of present illness.   Otherwise, the review of systems is positive for easy bruising and anxiety.   All other systems are reviewed and negative.   Physical Exam: VS:  BP 122/64 mmHg  Pulse 63  Ht 5' 2.5" (1.588 m)  Wt 197 lb 6.4 oz (89.54 kg)  BMI 35.51 kg/m2  SpO2 98% .  BMI Body mass index is 35.51 kg/(m^2).  Wt Readings from Last 3 Encounters:  02/24/15 197 lb 6.4 oz (89.54 kg)  01/04/15 198 lb (89.812 kg)  12/21/14 205 lb (92.987 kg)  General: Pleasant. Well developed, well nourished and in no acute distress. She is obese. Weight is down 1 pounds.  HEENT: Normal. Neck: Supple, no JVD, carotid bruits, or masses noted.  Cardiac: Regular rate and rhythm. No murmurs, rubs, or gallops. No edema.  Respiratory:  Lungs are clear to auscultation bilaterally with normal work of breathing.  GI: Soft and nontender.  MS: No deformity or atrophy. Gait and ROM intact. Skin: Warm and dry. Color is normal.  Neuro:  Strength and sensation are intact and no gross focal deficits noted.  Psych: Alert, appropriate and with normal affect.   LABORATORY DATA:  EKG:  EKG is not ordered today.  Lab Results  Component Value Date   WBC 8.1 01/04/2015   HGB 12.9 01/04/2015   HCT 38.4 01/04/2015   PLT 330.0 01/04/2015   GLUCOSE 92 01/04/2015   CHOL 149 12/20/2014   TRIG 138 12/20/2014   HDL 43 12/20/2014   LDLCALC 78 12/20/2014   ALT 13 12/12/2014   AST 14 12/12/2014   NA 139 01/04/2015   K 3.9 01/04/2015   CL 103 01/04/2015   CREATININE 0.95 01/04/2015   BUN 19 01/04/2015   CO2 28 01/04/2015   TSH 0.376  12/19/2014   INR 1.06 12/19/2014   HGBA1C 5.6 12/19/2014   MICROALBUR 0.3 12/12/2014   Lab Results  Component Value Date   CKTOTAL 87 02/02/2014   TROPONINI 15.00* 12/20/2014    BNP (last 3 results)  Recent Labs  12/19/14 1953 12/19/14 2350  BNP 155.1* 213.5*    ProBNP (last 3 results) No results for input(s): PROBNP in the last 8760 hours.   Other Studies Reviewed Today: Cardiac catheterization Conclusion     Dist LAD lesion, 90% stenosed.  Apical infarction with preserved overall LVEF  IMPRESSIONS:  Single vessel CAD. Small to moderate apical infarction due to a distal LAD stenosis with reestablished (TIMI2) flow   RECOMMENDATION:  Medical therapy with statin, lifelong ASA, dual antiplatelet therapy for 12 months.  Prefer ARB/ACEi for BP control.  Beta blockers would be helpful, but precluded by baseline bradycardia.  Aggressive control of risk factors, especially DM.          Echo Study Conclusions  - Left ventricle: The cavity size was normal. There was mild focal basal hypertrophy of the septum. Systolic function was normal. The estimated ejection fraction was in the range of 60% to 65%. Wall motion was normal; there were no regional wall motion abnormalities. Doppler parameters are consistent with abnormal left ventricular relaxation (grade 1 diastolic dysfunction). Doppler parameters are consistent with elevated ventricular end-diastolic filling pressure. - Aortic valve: Trileaflet; normal thickness leaflets. There was mild regurgitation. - Aortic root: The aortic root was normal in size. - Left atrium: The atrium was mildly dilated. - Right ventricle: The cavity size was normal. Wall thickness was normal. Systolic function was normal. - Right atrium: The atrium was normal in size. - Tricuspid valve: There was mild regurgitation. - Pulmonary arteries: Systolic pressure was mildly increased. PA peak pressure: 44 mm Hg  (S). - Inferior vena cava: The vessel was dilated. The respirophasic diameter changes were blunted (< 50%), consistent with elevated central venous pressure. - Pericardium, extracardiac: A trivial pericardial effusion was identified.   Assessment/Plan: 1. Apical MI -  manage medically. She is doing well without symptoms. Continue with current regimen. Occasionally, she will be anxious with some throat discomfort. Her original MI: Sided with what she thought was a sinus infection.  2. HTN -  BP excellent - she will start monitoring at home and call us if staying below 014 systolic - I would favor cutting her ARB/diuretic in half if needed. If weight loss continues, we will most likely need to cut this pill in half.  3. HLD - on statin, LDL <70, checking lab work today.  4. DM - managing with diet/exercise/weight  5. Obesity-continue to encourage weight loss, current BMI 35. Lap band.  Current medicines are reviewed with the patient today.  The patient does not have concerns regarding medicines other than what has been noted above.  The following changes have been made:  See above.  Labs/ tests ordered today include:    No orders of the defined types were placed in this encounter.     Disposition:   FU with Dr. Marlou Porch in 6 months    Patient is agreeable to this plan and will call if any problems develop in the interim.   Signed: Candee Furbish, MD   02/24/2015 8:18 AM  [facility] [address] [place], [place]  [address] Phone: [phone] Fax: [phone]

## 2015-02-24 NOTE — Patient Instructions (Signed)
Medication Instructions:  Your physician recommends that you continue on your current medications as directed. Please refer to the Current Medication list given to you today.  Labwork: Please have a lipid and liver today.  Follow-Up: Follow up in 6 months with Dr. Marlou Porch.  You will receive a letter in the mail 2 months before you are due.  Please call us when you receive this letter to schedule your follow up appointment.  Thank you for choosing [facility]!!

## 2015-03-01 ENCOUNTER — Other Ambulatory Visit (INDEPENDENT_AMBULATORY_CARE_PROVIDER_SITE_OTHER): Payer: BLUE CROSS/BLUE SHIELD

## 2015-03-01 DIAGNOSIS — Z79899 Other long term (current) drug therapy: Secondary | ICD-10-CM | POA: Diagnosis not present

## 2015-03-01 DIAGNOSIS — E7800 Pure hypercholesterolemia, unspecified: Secondary | ICD-10-CM

## 2015-03-01 LAB — LIPID PANEL
CHOL/HDL RATIO: 3
CHOLESTEROL: 118 mg/dL (ref 0–200)
HDL: 43.9 mg/dL (ref >39.00)
LDL CALC: 61 mg/dL (ref 0–99)
NonHDL: 73.76
TRIGLYCERIDES: 64 mg/dL (ref 0.0–149.0)
VLDL: 12.8 mg/dL (ref 0.0–40.0)

## 2015-03-09 ENCOUNTER — Other Ambulatory Visit: Payer: Self-pay | Admitting: Physician Assistant

## 2015-03-26 ENCOUNTER — Other Ambulatory Visit: Payer: Self-pay | Admitting: Family Medicine

## 2015-04-17 ENCOUNTER — Ambulatory Visit: Payer: BLUE CROSS/BLUE SHIELD | Admitting: Family Medicine

## 2015-05-05 ENCOUNTER — Other Ambulatory Visit: Payer: Self-pay | Admitting: Family Medicine

## 2015-05-15 ENCOUNTER — Encounter: Payer: Self-pay | Admitting: Family Medicine

## 2015-05-15 ENCOUNTER — Ambulatory Visit (INDEPENDENT_AMBULATORY_CARE_PROVIDER_SITE_OTHER): Payer: BLUE CROSS/BLUE SHIELD | Admitting: Family Medicine

## 2015-05-15 VITALS — BP 110/66 | HR 66 | Temp 98.4°F | Resp 16 | Ht 62.0 in | Wt 194.4 lb

## 2015-05-15 DIAGNOSIS — E119 Type 2 diabetes mellitus without complications: Secondary | ICD-10-CM | POA: Diagnosis not present

## 2015-05-15 DIAGNOSIS — E7800 Pure hypercholesterolemia, unspecified: Secondary | ICD-10-CM | POA: Diagnosis not present

## 2015-05-15 DIAGNOSIS — I2510 Atherosclerotic heart disease of native coronary artery without angina pectoris: Secondary | ICD-10-CM | POA: Diagnosis not present

## 2015-05-15 DIAGNOSIS — I10 Essential (primary) hypertension: Secondary | ICD-10-CM | POA: Diagnosis not present

## 2015-05-15 LAB — CBC WITH DIFFERENTIAL/PLATELET
Basophils Absolute: 0 10*3/uL (ref 0.0–0.1)
Basophils Relative: 0 % (ref 0–1)
Eosinophils Absolute: 0.1 10*3/uL (ref 0.0–0.7)
Eosinophils Relative: 1 % (ref 0–5)
HCT: 35.4 % — ABNORMAL LOW (ref 36.0–46.0)
Hemoglobin: 11.7 g/dL — ABNORMAL LOW (ref 12.0–15.0)
LYMPHS PCT: 28 % (ref 12–46)
Lymphs Abs: 1.8 10*3/uL (ref 0.7–4.0)
MCH: 30.5 pg (ref 26.0–34.0)
MCHC: 33.1 g/dL (ref 30.0–36.0)
MCV: 92.2 fL (ref 78.0–100.0)
MONO ABS: 0.4 10*3/uL (ref 0.1–1.0)
MPV: 9.6 fL (ref 8.6–12.4)
Monocytes Relative: 6 % (ref 3–12)
NEUTROS ABS: 4.2 10*3/uL (ref 1.7–7.7)
Neutrophils Relative %: 65 % (ref 43–77)
Platelets: 302 10*3/uL (ref 150–400)
RBC: 3.84 MIL/uL — AB (ref 3.87–5.11)
RDW: 13 % (ref 11.5–15.5)
WBC: 6.5 10*3/uL (ref 4.0–10.5)

## 2015-05-15 LAB — COMPREHENSIVE METABOLIC PANEL
ALT: 16 U/L (ref 6–29)
AST: 18 U/L (ref 10–35)
Albumin: 4.2 g/dL (ref 3.6–5.1)
Alkaline Phosphatase: 60 U/L (ref 33–130)
BILIRUBIN TOTAL: 0.7 mg/dL (ref 0.2–1.2)
BUN: 32 mg/dL — ABNORMAL HIGH (ref 7–25)
CALCIUM: 9.5 mg/dL (ref 8.6–10.4)
CHLORIDE: 103 mmol/L (ref 98–110)
CO2: 27 mmol/L (ref 20–31)
Creat: 1.13 mg/dL — ABNORMAL HIGH (ref 0.50–0.99)
GLUCOSE: 102 mg/dL — AB (ref 65–99)
POTASSIUM: 4.2 mmol/L (ref 3.5–5.3)
Sodium: 140 mmol/L (ref 135–146)
Total Protein: 6.8 g/dL (ref 6.1–8.1)

## 2015-05-15 LAB — HEMOGLOBIN A1C
Hgb A1c MFr Bld: 5.9 % — ABNORMAL HIGH (ref <5.7)
Mean Plasma Glucose: 123 mg/dL — ABNORMAL HIGH (ref <117)

## 2015-05-15 MED ORDER — ZOSTER VACCINE LIVE 19400 UNT/0.65ML ~~LOC~~ SOLR: 0.6500 mL | Freq: Once | SUBCUTANEOUS | Status: DC

## 2015-05-15 NOTE — Progress Notes (Signed)
Subjective:    Patient ID: Elaine Jackson, female    DOB: [birth year], 59 y.o.   MRN: [id number]  05/15/2015  Follow-up and Medication Refill   HPI This 59 y.o. female presents for TRANSITION INT CARE:   1.  CAD/AMI:  S/p evaluation by Dr. Marlou Porch in 02/2015.  LDL 61.  Unable to add Metoproplol due to bradycardia.  2.  DMII: Patient reports good compliance with medication, good tolerance to medication, and good symptom control.  Rarely taking Metformin.   3.  Hyperlipidemia: Patient reports good compliance with medication, good tolerance to medication, and good symptom control.    4.  HTN: Patient reports good compliance with medication, good tolerance to medication, and good symptom control.    5. Stress reaction: major stressors with daughter and two grandsons.  Review of Systems  Constitutional: Negative for fever, chills, diaphoresis and fatigue.  Eyes: Negative for visual disturbance.  Respiratory: Negative for cough and shortness of breath.   Cardiovascular: Negative for chest pain, palpitations and leg swelling.  Gastrointestinal: Negative for nausea, vomiting, abdominal pain, diarrhea and constipation.  Endocrine: Negative for cold intolerance, heat intolerance, polydipsia, polyphagia and polyuria.  Neurological: Negative for dizziness, tremors, seizures, syncope, facial asymmetry, speech difficulty, weakness, light-headedness, numbness and headaches.    Past Medical History  Diagnosis Date  . Hypertension   . Diabetes mellitus   . Hyperlipidemia   . Family history of breast cancer in first degree relative     sister age 61  . Sleep related leg cramps   . Allergic rhinitis, cause unspecified   . Obesity, unspecified   . Unspecified vitamin D deficiency   . Other chronic nonalcoholic liver disease   . Chicken pox   . Measles   . Malignant neoplasm skin of face 02/16/2007    Basal Cell Carcinoma  Tafeen.  . Allergy   . Heart murmur   . CAD (coronary artery  disease)     LHC 12/20/2014 showed small to moderate apical infarction due to distal LAD stenosis with reestablished TIMI2 flow. No PCI. Recommended plavix for 1 yr and ASA for life.    Past Surgical History  Procedure Laterality Date  . Laparoscopic gastric banding  07/03/10    Dr. Sherrin Daisy; [facility]  . Cholecystectomy    . Tonsillectomy    . Colonoscopy  06/25/2007    Hung. Normal. Repeat in ten years.  . Cardiac catheterization N/A 12/20/2014    Procedure: Left Heart Cath and Coronary Angiography;  Surgeon: Sanda Klein, MD;  Location: [facility];  Service: Cardiovascular;  Laterality: N/A;   Allergies  Allergen Reactions  . Sulfa Antibiotics Nausea And Vomiting   Current Outpatient Prescriptions  Medication Sig Dispense Refill  . aspirin EC 81 MG EC tablet Take 1 tablet (81 mg total) by mouth daily.    Marland Kitchen atorvastatin (LIPITOR) 80 MG tablet Take 1 tablet (80 mg total) by mouth daily at 6 PM. 30 tablet 11  . cholecalciferol (VITAMIN D) 1000 UNITS tablet Take 2,000 Units by mouth daily.    . clopidogrel (PLAVIX) 75 MG tablet Take 1 tablet (75 mg total) by mouth daily. 90 tablet 3  . fluticasone (FLONASE) 50 MCG/ACT nasal spray Place into both nostrils as needed for allergies or rhinitis.    Marland Kitchen losartan-hydrochlorothiazide (HYZAAR) 100-25 MG per tablet Take 1 tablet by mouth daily. 90 tablet 3  . montelukast (SINGULAIR) 10 MG tablet TAKE 1 TABLET (10 MG TOTAL) BY MOUTH AT BEDTIME. (Patient  taking differently: Take 10 mg by mouth. ) 90 tablet 3  . nitroGLYCERIN (NITROSTAT) 0.4 MG SL tablet Place 1 tablet (0.4 mg total) under the tongue every 5 (five) minutes x 3 doses as needed for chest pain. 25 tablet 3  . ONE TOUCH ULTRA TEST test strip TEST DAILY 100 each 3  . glucose monitoring kit (FREESTYLE) monitoring kit 1 each by Does not apply route as needed for other. One touch ultra 2 (Patient not taking: Reported on 05/15/2015) 1 each 0  . Lancets (ONETOUCH ULTRASOFT) lancets  Use as instructed One touch ultra 2 100 each 12  . metFORMIN (GLUCOPHAGE) 500 MG tablet Take 1 tablet (500 mg total) by mouth daily with breakfast. (Patient not taking: Reported on 01/04/2015) 90 tablet 3  . zoster vaccine live, PF, (ZOSTAVAX) 58099 UNT/0.65ML injection Inject 19,400 Units into the skin once. 0.65 mL 0   No current facility-administered medications for this visit.   Social History   Social History  . Marital Status: Married    Spouse Name: N/A  . Number of Children: 1  . Years of Education: N/A   Occupational History  . HAIR STYLIST    Social History Main Topics  . Smoking status: Former Smoker -- 1.00 packs/day for 25 years    Types: Cigarettes  . Smokeless tobacco: Never Used     Comment: quit age 37     smoked from 5 to 4 off and on  . Alcohol Use: No  . Drug Use: No  . Sexual Activity: Yes    Birth Control/ Protection: Post-menopausal   Other Topics Concern  . Not on file   Social History Narrative   Marital status:  Married; x 25 years. Second marriage. Happily married; no abuse      Children: one child, one stepson;two grandsons      Lives: with husband, daughter, 2 grandsons.      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      Tobacco:  None      Alcohol: rare/minimal.      Drugs:  None       Exercise: none       Seatbelt:  100%.       Sunscreen: SPF 15.      Guns:  Maybe?   Family History  Problem Relation Age of Onset  . Diabetes Mother   . Heart disease Mother     CHF with defibrillator; carotid artery stenosis.  . Hypertension Mother   . Hyperlipidemia Mother   . Dementia Mother   . Heart disease Father   . Kidney disease Father     renal failure ESRD/peritoneal dialysis  . Diabetes Father   . Hypertension Father   . Hyperlipidemia Father   . Stroke Father   . Diabetes Sister   . Hypertension Sister   . Kidney disease Sister     renal failure  . Diabetes Brother   . Hypertension Brother    . Hyperlipidemia Brother   . Arthritis Brother   . Cancer Sister     cervical cancer  . Hypertension Sister   . Cancer Sister 3    Liver cancer from hepatitis C.  . Stroke Maternal Grandmother   . Heart disease Maternal Grandfather   . Stroke Paternal Grandmother   . Heart disease Paternal Grandmother   . Heart disease Paternal Grandfather   . Stroke Paternal Grandfather        Objective:  BP 110/66 mmHg  Pulse 66  Temp(Src) 98.4 F (36.9 C) (Oral)  Resp 16  Ht '5\' 2"'  (1.575 m)  Wt 194 lb 6.4 oz (88.179 kg)  BMI 35.55 kg/m2 Physical Exam  Constitutional: She is oriented to person, place, and time. She appears well-developed and well-nourished. No distress.  HENT:  Head: Normocephalic and atraumatic.  Right Ear: External ear normal.  Left Ear: External ear normal.  Nose: Nose normal.  Mouth/Throat: Oropharynx is clear and moist.  Eyes: Conjunctivae and EOM are normal. Pupils are equal, round, and reactive to light.  Neck: Normal range of motion. Neck supple. Carotid bruit is not present. No thyromegaly present.  Cardiovascular: Normal rate, regular rhythm, normal heart sounds and intact distal pulses.  Exam reveals no gallop and no friction rub.   No murmur heard. Pulmonary/Chest: Effort normal and breath sounds normal. She has no wheezes. She has no rales.  Abdominal: Soft. Bowel sounds are normal. She exhibits no distension and no mass. There is no tenderness. There is no rebound and no guarding.  Lymphadenopathy:    She has no cervical adenopathy.  Neurological: She is alert and oriented to person, place, and time. No cranial nerve deficit.  Skin: Skin is warm and dry. No rash noted. She is not diaphoretic. No erythema. No pallor.  Psychiatric: She has a normal mood and affect. Her behavior is normal.        Assessment & Plan:   1. Controlled type 2 diabetes mellitus without complication, without long-term current use of insulin ([employer])   2. Pure  hypercholesterolemia   3. Essential hypertension   4. Coronary artery disease involving native coronary artery of native heart without angina pectoris     Orders Placed This Encounter  Procedures  . CBC with Differential/Platelet  . Comprehensive metabolic panel  . Hemoglobin A1c   Meds ordered this encounter  Medications  . zoster vaccine live, PF, (ZOSTAVAX) 11155 UNT/0.65ML injection    Sig: Inject 19,400 Units into the skin once.    Dispense:  0.65 mL    Refill:  0    Return in about 4 months (around 09/12/2015) for recheck.    Elaine Jackson, M.D. [facility] [address] [place], [place]  [address] [phone] phone [phone] fax

## 2015-05-25 ENCOUNTER — Other Ambulatory Visit: Payer: Self-pay

## 2015-05-25 MED ORDER — GLUCOSE BLOOD VI STRP: ORAL_STRIP | Status: DC

## 2015-05-25 MED ORDER — BLOOD GLUCOSE MONITOR KIT: PACK | Status: DC

## 2015-05-30 ENCOUNTER — Encounter: Payer: Self-pay | Admitting: Family Medicine

## 2015-06-21 ENCOUNTER — Encounter: Payer: Self-pay | Admitting: *Deleted

## 2015-07-17 ENCOUNTER — Other Ambulatory Visit: Payer: Self-pay

## 2015-07-17 MED ORDER — ONETOUCH ULTRA SYSTEM W/DEVICE KIT: PACK | Status: DC

## 2015-07-17 MED ORDER — GLUCOSE BLOOD VI STRP: ORAL_STRIP | Status: DC

## 2015-08-23 ENCOUNTER — Encounter: Payer: Self-pay | Admitting: *Deleted

## 2015-08-28 ENCOUNTER — Ambulatory Visit (INDEPENDENT_AMBULATORY_CARE_PROVIDER_SITE_OTHER): Payer: BLUE CROSS/BLUE SHIELD | Admitting: Cardiology

## 2015-08-28 ENCOUNTER — Encounter: Payer: Self-pay | Admitting: Cardiology

## 2015-08-28 VITALS — BP 102/50 | HR 53 | Ht 63.0 in | Wt 201.2 lb

## 2015-08-28 DIAGNOSIS — Z9889 Other specified postprocedural states: Secondary | ICD-10-CM

## 2015-08-28 DIAGNOSIS — I10 Essential (primary) hypertension: Secondary | ICD-10-CM

## 2015-08-28 DIAGNOSIS — I259 Chronic ischemic heart disease, unspecified: Secondary | ICD-10-CM | POA: Insufficient documentation

## 2015-08-28 DIAGNOSIS — E785 Hyperlipidemia, unspecified: Secondary | ICD-10-CM | POA: Diagnosis not present

## 2015-08-28 DIAGNOSIS — I252 Old myocardial infarction: Secondary | ICD-10-CM | POA: Diagnosis not present

## 2015-08-28 DIAGNOSIS — IMO0002 Reserved for concepts with insufficient information to code with codable children: Secondary | ICD-10-CM | POA: Insufficient documentation

## 2015-08-28 DIAGNOSIS — E669 Obesity, unspecified: Secondary | ICD-10-CM

## 2015-08-28 DIAGNOSIS — I272 Other secondary pulmonary hypertension: Secondary | ICD-10-CM

## 2015-08-28 NOTE — Progress Notes (Signed)
CARDIOLOGY OFFICE NOTE  Date:  08/28/2015    [name] Date of Birth: Dec 22, 1955 Medical Record [id number]  PCP:  Reginia Forts, MD  Cardiologist:  Marlou Porch      History of Present Illness: Elaine Jackson is a 60 y.o. female who is post apical MI with 90% distal LAD lesion, no PCI, 12/20/14 med mgt. DAPT 12 months.   She has a past medical history of type 2 DM on PRN metformin and been controlled mainly with diet and exercise, HTN, HLD, family history of CAD/MI. She is obese. She has had prior lap banding surgery.  She presented to [facility] on 12/19/2014 for headache, jaw pain which started around 11:30 AM. The symptom was associated with diaphoresis. Patient initially did not try to seek medical attention. However she eventually went to urgent care around 4:30 PM that evening and was found to have abnormal EKG with subtle ST elevation in the continued improving symptom. She was referred to [facility]. On arrival, her symptom has largely improved with 4-5 out of 10 jaw discomfort and continued posterior headache. EKG showed minimal ST elevation in inferior lead and some ST depression in the anterior lead without reciprocal changes. She was placed on IV heparin. Overnight, her serial troponin went up to 30, before trending back down. We were not unable to add metoprolol given baseline bradycardia. Although nursing staff did note an episode of nonsustained VT on telemetry on 6/28, further review of the telemetry was very difficult to tell whether it is artifact versus true nonsustained VT as the patient was asymptomatic at the time. Given the significant motion artifact at the time, it is likely artifact.  She underwent a scheduled cardiac catheterization on 12/20/2014 and noted small to moderate apical infarction due to distal LAD stenosis with reestablished TIMI grade 2 flow. Medical therapy was recommended. She was loaded with Plavix prior to discharge.   Comes back today.  Here alone today. Doing ok.Still does not feel perfect. Breath deep feels better. Feels like throat is dried up at times. Jaw clincher.  No real problems since discharge. Blood sugars remain stable. Has lap band. Weight loss. Trying walk. Does not check BP at home. Little soft here but asymptomatic. She does not normally check routinely. Admits she is pretty anxious. Has returned to work. Walking 20 minutes a day without issue.   Had one episode of pounding, took NTG. Dental xray - carotid plaque.   Past Medical History  Diagnosis Date  . Hypertension   . Diabetes mellitus   . Hyperlipidemia   . Family history of breast cancer in first degree relative     sister age 27  . Sleep related leg cramps   . Allergic rhinitis, cause unspecified   . Obesity, unspecified   . Unspecified vitamin D deficiency   . Other chronic nonalcoholic liver disease   . Chicken pox   . Measles   . Malignant neoplasm skin of face 02/16/2007    Basal Cell Carcinoma  Tafeen.  . Allergy   . Heart murmur   . CAD (coronary artery disease)     LHC 12/20/2014 showed small to moderate apical infarction due to distal LAD stenosis with reestablished TIMI2 flow. No PCI. Recommended plavix for 1 yr and ASA for life.     Past Surgical History  Procedure Laterality Date  . Laparoscopic gastric banding  07/03/10    Dr. Sherrin Daisy; [facility]  . Cholecystectomy    .  Tonsillectomy    . Colonoscopy  06/25/2007    Hung. Normal. Repeat in ten years.  . Cardiac catheterization N/A 12/20/2014    Procedure: Left Heart Cath and Coronary Angiography;  Surgeon: Sanda Klein, MD;  Location: [facility];  Service: Cardiovascular;  Laterality: N/A;     Medications: Current Outpatient Prescriptions  Medication Sig Dispense Refill  . aspirin EC 81 MG EC tablet Take 1 tablet (81 mg total) by mouth daily.    Marland Kitchen atorvastatin (LIPITOR) 80 MG tablet Take 1 tablet (80 mg total) by mouth daily at 6 PM. 30 tablet 11  .  cholecalciferol (VITAMIN D) 1000 UNITS tablet Take 2,000 Units by mouth daily.    . clopidogrel (PLAVIX) 75 MG tablet Take 1 tablet (75 mg total) by mouth daily. 90 tablet 3  . fluticasone (FLONASE) 50 MCG/ACT nasal spray Place 2 sprays into both nostrils daily as needed. For allergies  11  . Lancets (ONETOUCH ULTRASOFT) lancets Use as instructed One touch ultra 2 100 each 12  . losartan-hydrochlorothiazide (HYZAAR) 100-25 MG per tablet Take 1 tablet by mouth daily. 90 tablet 3  . montelukast (SINGULAIR) 10 MG tablet TAKE 1 TABLET (10 MG TOTAL) BY MOUTH AT BEDTIME. (Patient taking differently: Take 10 mg by mouth. ) 90 tablet 3  . nitroGLYCERIN (NITROSTAT) 0.4 MG SL tablet Place 1 tablet (0.4 mg total) under the tongue every 5 (five) minutes x 3 doses as needed for chest pain. 25 tablet 3   No current facility-administered medications for this visit.    Allergies: Allergies  Allergen Reactions  . Sulfa Antibiotics Nausea And Vomiting    Social History: The patient  reports that she has quit smoking. Her smoking use included Cigarettes. She has a 25 pack-year smoking history. She has never used smokeless tobacco. She reports that she does not drink alcohol or use illicit drugs.   Family History: The patient's family history includes Arthritis in her brother; Breast cancer (age of onset: 79) in her sister; Cervical cancer in her sister; Congestive Heart Failure in her father and mother; Dementia in her mother; Diabetes in her brother, father, mother, and sister; Heart disease in her father, maternal grandfather, mother, paternal grandfather, and paternal grandmother; Hepatitis C (age of onset: 31) in her sister; Hyperlipidemia in her brother, father, and mother; Hypertension in her brother, father, mother, sister, and sister; Kidney disease in her father and sister; Kidney failure in her father; Liver cancer (age of onset: 61) in her sister; Stroke in her father, maternal grandmother, paternal  grandfather, and paternal grandmother.   Review of Systems: Please see the history of present illness.   Otherwise, the review of systems is positive for easy bruising and anxiety.   All other systems are reviewed and negative.   Physical Exam: VS:  BP 102/50 mmHg  Pulse 53  Ht 5\' 3"  (1.6 m)  Wt 201 lb 3.2 oz (91.264 kg)  BMI 35.65 kg/m2  SpO2 99% .  BMI Body mass index is 35.65 kg/(m^2).  Wt Readings from Last 3 Encounters:  08/28/15 201 lb 3.2 oz (91.264 kg)  05/15/15 194 lb 6.4 oz (88.179 kg)  02/24/15 197 lb 6.4 oz (89.54 kg)    General: Pleasant. Well developed, well nourished and in no acute distress. She is obese. Weight is down 1 pounds.  HEENT: Normal. Neck: Supple, no JVD, carotid bruits, or masses noted.  Cardiac: Regular rate and rhythm. No murmurs, rubs, or gallops. No edema.  Respiratory:  Lungs  are clear to auscultation bilaterally with normal work of breathing.  GI: Soft and nontender.  MS: No deformity or atrophy. Gait and ROM intact. Skin: Warm and dry. Color is normal.  Neuro:  Strength and sensation are intact and no gross focal deficits noted.  Psych: Alert, appropriate and with normal affect.   LABORATORY DATA:  EKG:  EKG is not ordered today.  Lab Results  Component Value Date   WBC 6.5 05/15/2015   HGB 11.7* 05/15/2015   HCT 35.4* 05/15/2015   PLT 302 05/15/2015   GLUCOSE 102* 05/15/2015   CHOL 118 03/01/2015   TRIG 64.0 03/01/2015   HDL 43.90 03/01/2015   LDLCALC 61 03/01/2015   ALT 16 05/15/2015   AST 18 05/15/2015   NA 140 05/15/2015   K 4.2 05/15/2015   CL 103 05/15/2015   CREATININE 1.13* 05/15/2015   BUN 32* 05/15/2015   CO2 27 05/15/2015   TSH 0.376 12/19/2014   INR 1.06 12/19/2014   HGBA1C 5.9* 05/15/2015   MICROALBUR 0.3 12/12/2014   Lab Results  Component Value Date   CKTOTAL 87 02/02/2014   TROPONINI 15.00* 12/20/2014    BNP (last 3 results)  Recent Labs  12/19/14 1953 12/19/14 2350  BNP 155.1* 213.5*     ProBNP (last 3 results) No results for input(s): PROBNP in the last 8760 hours.   Other Studies Reviewed Today: Cardiac catheterization Conclusion     Dist LAD lesion, 90% stenosed.  Apical infarction with preserved overall LVEF  IMPRESSIONS:  Single vessel CAD. Small to moderate apical infarction due to a distal LAD stenosis with reestablished (TIMI2) flow   RECOMMENDATION:  Medical therapy with statin, lifelong ASA, dual antiplatelet therapy for 12 months.  Prefer ARB/ACEi for BP control.  Beta blockers would be helpful, but precluded by baseline bradycardia.  Aggressive control of risk factors, especially DM.          Echo Study Conclusions  - Left ventricle: The cavity size was normal. There was mild focalbasal hypertrophy of the septum. Systolic function was normal.The estimated ejection fraction was in the range of 60% to 65%.Wall motion was normal; there were no regional wall motionabnormalities.  (grade 1 diastolic dysfunction).Doppler parameters are consistent with elevated ventricularend-diastolic filling pressure. - Aortic valve: Trileaflet; normal thickness leaflets. There was mild regurgitation. - Aortic root: The aortic root was normal in size. - Left atrium: The atrium was mildly dilated. - Right ventricle: The cavity size was normal. Wall thickness wasnormal. Systolic function was normal. - Right atrium: The atrium was normal in size. - Tricuspid valve: There was mild regurgitation. - Pulmonary arteries: Systolic pressure was mildly increased. PApeak pressure: 44 mm Hg (S). Obese - Inferior vena cava: The vessel was dilated. The respirophasicdiameter changes were blunted (< 50%), consistent with elevatedcentral venous pressure. - Pericardium, extracardiac: A trivial pericardial effusion was identified.   Assessment/Plan: 1. Apical MI -  manage medically. She is doing well without symptoms. Only had one episode of jaw discomfort,  cavity like discomfort. She took one nitroglycerin. Saw her chest rising and falling, pounding. No exertional symptoms. Continue with current regimen. Occasionally, she will be anxious with some throat discomfort. Her original MI: Sided with what she thought was a sinus infection. Dentist saw carotid plaque/calcification on Panorex. Continue with statin, blood pressure control, weight loss.  2. HTN - BP excellent - she will start monitoring at home and call us if staying below [id number] systolic - I would favor cutting her ARB/diuretic in half  if needed. If weight loss continues, we will most likely need to cut this pill in half.  3. HLD - on statin, LDL 61, continue with high-dose statin.  4. DM - managing with diet/exercise/weight, A1c 5.8  5. Obesity-continue to encourage weight loss, current BMI 35. Lap band.  6. Secondary pulmonary hypertension-44 mmHg likely secondary to obesity. Continue to encourage weight loss.  Current medicines are reviewed with the patient today.  The patient does not have concerns regarding medicines other than what has been noted above.  The following changes have been made:  See above.  Labs/ tests ordered today include:    No orders of the defined types were placed in this encounter.     We will stop Plavix in June. Continue with aspirin.   Disposition:   FU with Dr. Marlou Porch in 12 months    Patient is agreeable to this plan and will call if any problems develop in the interim.   Signed: Candee Furbish, MD   08/28/2015 8:25 AM  [facility] [address] [place], [place]  [address] Phone: [phone] Fax: [phone]

## 2015-08-28 NOTE — Patient Instructions (Signed)
Medication Instructions:  The current medical regimen is effective;  continue present plan and medications. You may stop your Plavix in June 2017 but continue ASA.  Follow-Up: Follow up in 1 year with Dr. Marlou Porch.  You will receive a letter in the mail 2 months before you are due.  Please call us when you receive this letter to schedule your follow up appointment.  If you need a refill on your cardiac medications before your next appointment, please call your pharmacy.  Thank you for choosing [facility]!!

## 2015-09-05 ENCOUNTER — Other Ambulatory Visit: Payer: Self-pay

## 2015-09-05 DIAGNOSIS — Z1231 Encounter for screening mammogram for malignant neoplasm of breast: Secondary | ICD-10-CM

## 2015-09-11 ENCOUNTER — Ambulatory Visit: Payer: BLUE CROSS/BLUE SHIELD | Admitting: Family Medicine

## 2015-09-26 ENCOUNTER — Encounter: Payer: Self-pay | Admitting: Family Medicine

## 2015-09-26 ENCOUNTER — Ambulatory Visit (INDEPENDENT_AMBULATORY_CARE_PROVIDER_SITE_OTHER): Payer: BLUE CROSS/BLUE SHIELD | Admitting: Family Medicine

## 2015-09-26 VITALS — BP 108/58 | HR 64 | Temp 97.7°F | Resp 16 | Ht 61.75 in | Wt 199.4 lb

## 2015-09-26 DIAGNOSIS — I2510 Atherosclerotic heart disease of native coronary artery without angina pectoris: Secondary | ICD-10-CM

## 2015-09-26 DIAGNOSIS — Z114 Encounter for screening for human immunodeficiency virus [HIV]: Secondary | ICD-10-CM | POA: Diagnosis not present

## 2015-09-26 DIAGNOSIS — I10 Essential (primary) hypertension: Secondary | ICD-10-CM

## 2015-09-26 DIAGNOSIS — E119 Type 2 diabetes mellitus without complications: Secondary | ICD-10-CM

## 2015-09-26 DIAGNOSIS — Z1159 Encounter for screening for other viral diseases: Secondary | ICD-10-CM

## 2015-09-26 DIAGNOSIS — E785 Hyperlipidemia, unspecified: Secondary | ICD-10-CM | POA: Diagnosis not present

## 2015-09-26 LAB — CBC WITH DIFFERENTIAL/PLATELET
BASOS PCT: 0 %
Basophils Absolute: 0 cells/uL (ref 0–200)
EOS ABS: 79 {cells}/uL (ref 15–500)
Eosinophils Relative: 1 %
HEMATOCRIT: 38.3 % (ref 35.0–45.0)
Hemoglobin: 12.9 g/dL (ref 11.7–15.5)
LYMPHS PCT: 25 %
Lymphs Abs: 1975 cells/uL (ref 850–3900)
MCH: 30.6 pg (ref 27.0–33.0)
MCHC: 33.7 g/dL (ref 32.0–36.0)
MCV: 91 fL (ref 80.0–100.0)
MONO ABS: 553 {cells}/uL (ref 200–950)
MONOS PCT: 7 %
MPV: 9.7 fL (ref 7.5–12.5)
NEUTROS PCT: 67 %
Neutro Abs: 5293 cells/uL (ref 1500–7800)
PLATELETS: 334 10*3/uL (ref 140–400)
RBC: 4.21 MIL/uL (ref 3.80–5.10)
RDW: 13.1 % (ref 11.0–15.0)
WBC: 7.9 10*3/uL (ref 3.8–10.8)

## 2015-09-26 LAB — COMPREHENSIVE METABOLIC PANEL
ALT: 21 U/L (ref 6–29)
AST: 21 U/L (ref 10–35)
Albumin: 4.7 g/dL (ref 3.6–5.1)
Alkaline Phosphatase: 59 U/L (ref 33–130)
BUN: 32 mg/dL — AB (ref 7–25)
CALCIUM: 9.3 mg/dL (ref 8.6–10.4)
CHLORIDE: 100 mmol/L (ref 98–110)
CO2: 25 mmol/L (ref 20–31)
Creat: 1.11 mg/dL — ABNORMAL HIGH (ref 0.50–0.99)
GLUCOSE: 91 mg/dL (ref 65–99)
POTASSIUM: 4.2 mmol/L (ref 3.5–5.3)
Sodium: 140 mmol/L (ref 135–146)
Total Bilirubin: 0.7 mg/dL (ref 0.2–1.2)
Total Protein: 7.1 g/dL (ref 6.1–8.1)

## 2015-09-26 LAB — LIPID PANEL
CHOLESTEROL: 133 mg/dL (ref 125–200)
HDL: 51 mg/dL (ref >=46)
LDL Cholesterol: 65 mg/dL (ref <130)
Total CHOL/HDL Ratio: 2.6 Ratio (ref <=5.0)
Triglycerides: 87 mg/dL (ref <150)
VLDL: 17 mg/dL (ref <30)

## 2015-09-26 LAB — HEMOGLOBIN A1C
Hgb A1c MFr Bld: 6.2 % — ABNORMAL HIGH (ref <5.7)
Mean Plasma Glucose: 131 mg/dL

## 2015-09-26 NOTE — Progress Notes (Signed)
Subjective:    Patient ID: Elaine Jackson, female    DOB: 1956/06/08, 60 y.o.   MRN: [id number]  09/26/2015  Follow-up and Diabetes   HPI This 60 y.o. female presents for four month follow-up:  1.  DMII: Patient reports good compliance with medication, good tolerance to medication, and good symptom control.    2.  HTN:  Patient reports good compliance with medication, good tolerance to medication, and good symptom control.    3.  Hyperlipididemia: Patient reports good compliance with medication, good tolerance to medication, and good symptom control.    4. CAD: Patient reports good compliance with medication, good tolerance to medication, and good symptom control.    Has boys 2-3 nights per week; picks up from school every day.    Review of Systems  Constitutional: Negative for fever, chills, diaphoresis and fatigue.  Eyes: Negative for visual disturbance.  Respiratory: Negative for cough and shortness of breath.   Cardiovascular: Negative for chest pain, palpitations and leg swelling.  Gastrointestinal: Negative for nausea, vomiting, abdominal pain, diarrhea and constipation.  Endocrine: Negative for cold intolerance, heat intolerance, polydipsia, polyphagia and polyuria.  Neurological: Negative for dizziness, tremors, seizures, syncope, facial asymmetry, speech difficulty, weakness, light-headedness, numbness and headaches.    Past Medical History  Diagnosis Date  . Hypertension   . Diabetes mellitus   . Hyperlipidemia   . Family history of breast cancer in first degree relative     sister age 50  . Sleep related leg cramps   . Allergic rhinitis, cause unspecified   . Obesity, unspecified   . Unspecified vitamin D deficiency   . Other chronic nonalcoholic liver disease   . Chicken pox   . Measles   . Malignant neoplasm skin of face 02/16/2007    Basal Cell Carcinoma  [place].  . Allergy   . Heart murmur   . CAD (coronary artery disease)     LHC 12/20/2014 showed small  to moderate apical infarction due to distal LAD stenosis with reestablished TIMI2 flow. No PCI. Recommended plavix for 1 yr and ASA for life.    Past Surgical History  Procedure Laterality Date  . Laparoscopic gastric banding  07/03/10    Dr. Sherrin Daisy; [facility]  . Cholecystectomy    . Tonsillectomy    . Colonoscopy  06/25/2007    [place]. Normal. Repeat in ten years.  . Cardiac catheterization N/A 12/20/2014    Procedure: Left Heart Cath and Coronary Angiography;  Surgeon: Sanda Klein, MD;  Location: [facility];  Service: Cardiovascular;  Laterality: N/A;   Allergies  Allergen Reactions  . Sulfa Antibiotics Nausea And Vomiting   Current Outpatient Prescriptions  Medication Sig Dispense Refill  . aspirin EC 81 MG EC tablet Take 1 tablet (81 mg total) by mouth daily.    Marland Kitchen atorvastatin (LIPITOR) 80 MG tablet Take 1 tablet (80 mg total) by mouth daily at 6 PM. 30 tablet 11  . cholecalciferol (VITAMIN D) 1000 UNITS tablet Take 2,000 Units by mouth daily.    . clopidogrel (PLAVIX) 75 MG tablet Take 1 tablet (75 mg total) by mouth daily. 90 tablet 3  . fluticasone (FLONASE) 50 MCG/ACT nasal spray Place 2 sprays into both nostrils daily as needed. For allergies  11  . Lancets (ONETOUCH ULTRASOFT) lancets Use as instructed One touch ultra 2 100 each 12  . losartan-hydrochlorothiazide (HYZAAR) 100-25 MG per tablet Take 1 tablet by mouth daily. 90 tablet 3  . montelukast (SINGULAIR) 10  MG tablet TAKE 1 TABLET (10 MG TOTAL) BY MOUTH AT BEDTIME. (Patient taking differently: Take 10 mg by mouth. ) 90 tablet 3  . nitroGLYCERIN (NITROSTAT) 0.4 MG SL tablet Place 1 tablet (0.4 mg total) under the tongue every 5 (five) minutes x 3 doses as needed for chest pain. 25 tablet 3   No current facility-administered medications for this visit.   Social History   Social History  . Marital Status: Married    Spouse Name: N/A  . Number of Children: 1  . Years of Education: N/A   Occupational  History  . HA[employer]    Social History Main Topics  . Smoking status: Former Smoker -- 1.00 packs/day for 25 years    Types: Cigarettes  . Smokeless tobacco: Never Used     Comment: quit age 69     smoked from 85 to 2 off and on  . Alcohol Use: No  . Drug Use: No  . Sexual Activity: Yes    Birth Control/ Protection: Post-menopausal   Other Topics Concern  . Not on file   Social History Narrative   Marital status:  Married; x 25 years. Second marriage. Happily married; no abuse      Children: one child, one stepson;two grandsons      Lives: with husband, daughter, 2 grandsons.      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      Tobacco:  None      Alcohol: rare/minimal.      Drugs:  None       Exercise: none       Seatbelt:  100%.       Sunscreen: SPF 15.      Guns:  Maybe?   Family History  Problem Relation Age of Onset  . Diabetes Mother   . Heart disease Mother     defibrillator; carotid artery stenosis.  . Hypertension Mother   . Hyperlipidemia Mother   . Dementia Mother   . Heart disease Father   . Kidney failure Father     ESRD/peritoneal dialysis  . Diabetes Father   . Hypertension Father   . Hyperlipidemia Father   . Stroke Father   . Diabetes Sister   . Hypertension Sister   . Kidney disease Sister     renal failure  . Diabetes Brother   . Hypertension Brother   . Hyperlipidemia Brother   . Arthritis Brother   . Cervical cancer Sister   . Hypertension Sister   . Liver cancer Sister 87  . Stroke Maternal Grandmother   . Heart disease Maternal Grandfather   . Stroke Paternal Grandmother   . Heart disease Paternal Grandmother   . Heart disease Paternal Grandfather   . Stroke Paternal Grandfather   . Breast cancer Sister 41  . Hepatitis C Sister 29  . Congestive Heart Failure Mother   . Kidney disease Father   . Congestive Heart Failure Father        Objective:    BP 108/58 mmHg  Pulse 64   Temp(Src) 97.7 F (36.5 C) (Oral)  Resp 16  Ht 5' 1.75" (1.568 m)  Wt 199 lb 6.4 oz (90.447 kg)  BMI 36.79 kg/m2  SpO2 98% Physical Exam  Constitutional: She is oriented to person, place, and time. She appears well-developed and well-nourished. No distress.  HENT:  Head: Normocephalic and atraumatic.  Right Ear: External ear normal.  Left Ear: External ear normal.  Nose: Nose normal.  Mouth/Throat: Oropharynx is clear and moist.  Eyes: Conjunctivae and EOM are normal. Pupils are equal, round, and reactive to light.  Neck: Normal range of motion. Neck supple. Carotid bruit is not present. No thyromegaly present.  Cardiovascular: Normal rate, regular rhythm, normal heart sounds and intact distal pulses.  Exam reveals no gallop and no friction rub.   No murmur heard. Pulmonary/Chest: Effort normal and breath sounds normal. She has no wheezes. She has no rales.  Abdominal: Soft. Bowel sounds are normal. She exhibits no distension and no mass. There is no tenderness. There is no rebound and no guarding.  Lymphadenopathy:    She has no cervical adenopathy.  Neurological: She is alert and oriented to person, place, and time. No cranial nerve deficit.  Skin: Skin is warm and dry. No rash noted. She is not diaphoretic. No erythema. No pallor.  Psychiatric: She has a normal mood and affect. Her behavior is normal.   Results for orders placed or performed in visit on 05/15/15  CBC with Differential/Platelet  Result Value Ref Range   WBC 6.5 4.0 - 10.5 K/uL   RBC 3.84 (L) 3.87 - 5.11 MIL/uL   Hemoglobin 11.7 (L) 12.0 - 15.0 g/dL   HCT 35.4 (L) 36.0 - 46.0 %   MCV 92.2 78.0 - 100.0 fL   MCH 30.5 26.0 - 34.0 pg   MCHC 33.1 30.0 - 36.0 g/dL   RDW 13.0 11.5 - 15.5 %   Platelets 302 150 - 400 K/uL   MPV 9.6 8.6 - 12.4 fL   Neutrophils Relative % 65 43 - 77 %   Neutro Abs 4.2 1.7 - 7.7 K/uL   Lymphocytes Relative 28 12 - 46 %   Lymphs Abs 1.8 0.7 - 4.0 K/uL   Monocytes Relative 6 3 - 12 %     Monocytes Absolute 0.4 0.1 - 1.0 K/uL   Eosinophils Relative 1 0 - 5 %   Eosinophils Absolute 0.1 0.0 - 0.7 K/uL   Basophils Relative 0 0 - 1 %   Basophils Absolute 0.0 0.0 - 0.1 K/uL   Smear Review Criteria for review not met   Comprehensive metabolic panel  Result Value Ref Range   Sodium 140 135 - 146 mmol/L   Potassium 4.2 3.5 - 5.3 mmol/L   Chloride 103 98 - 110 mmol/L   CO2 27 20 - 31 mmol/L   Glucose, Bld 102 (H) 65 - 99 mg/dL   BUN 32 (H) 7 - 25 mg/dL   Creat 1.13 (H) 0.50 - 0.99 mg/dL   Total Bilirubin 0.7 0.2 - 1.2 mg/dL   Alkaline Phosphatase 60 33 - 130 U/L   AST 18 10 - 35 U/L   ALT 16 6 - 29 U/L   Total Protein 6.8 6.1 - 8.1 g/dL   Albumin 4.2 3.6 - 5.1 g/dL   Calcium 9.5 8.6 - 10.4 mg/dL  Hemoglobin A1c  Result Value Ref Range   Hgb A1c MFr Bld 5.9 (H) <5.7 %   Mean Plasma Glucose 123 (H) <117 mg/dL       Assessment & Plan:   1. Coronary artery disease involving native coronary artery of native heart without angina pectoris   2. Controlled type 2 diabetes mellitus without complication, without long-term current use of insulin ([place])   3. HLD (hyperlipidemia)   4. Screening for HIV (human immunodeficiency virus)   5. Need for hepatitis C screening test   6. Essential hypertension  Orders Placed This Encounter  Procedures  . CBC with Differential/Platelet  . Comprehensive metabolic panel    Order Specific Question:  Has the patient fasted?    Answer:  Yes  . Hemoglobin A1c  . Lipid panel    Order Specific Question:  Has the patient fasted?    Answer:  Yes  . HIV antibody  . Hepatitis C antibody  . HM Diabetes Foot Exam   No orders of the defined types were placed in this encounter.    No Follow-up on file.    Lael Pilch Elayne Guerin, M.D. [facility] [address] [place], [place]  [address] [phone] phone [phone] fax

## 2015-09-26 NOTE — Patient Instructions (Signed)
Please schedule your diabetic eye exam.

## 2015-09-27 LAB — HEPATITIS C ANTIBODY: HCV AB: NEGATIVE

## 2015-09-27 LAB — HIV ANTIBODY (ROUTINE TESTING W REFLEX): HIV 1&2 Ab, 4th Generation: NONREACTIVE

## 2015-10-02 ENCOUNTER — Ambulatory Visit
Admission: RE | Admit: 2015-10-02 | Discharge: 2015-10-02 | Disposition: A | Discharge status: Home / Self Care | Payer: BLUE CROSS/BLUE SHIELD | Source: Ambulatory Visit

## 2015-10-02 DIAGNOSIS — Z1231 Encounter for screening mammogram for malignant neoplasm of breast: Secondary | ICD-10-CM

## 2015-10-05 ENCOUNTER — Encounter: Payer: Self-pay | Admitting: Family Medicine

## 2015-11-03 ENCOUNTER — Other Ambulatory Visit: Payer: Self-pay | Admitting: Family Medicine

## 2015-12-25 ENCOUNTER — Other Ambulatory Visit: Payer: Self-pay | Admitting: Family Medicine

## 2015-12-26 ENCOUNTER — Other Ambulatory Visit: Payer: Self-pay | Admitting: Family Medicine

## 2016-04-02 ENCOUNTER — Encounter: Payer: BLUE CROSS/BLUE SHIELD | Admitting: Family Medicine

## 2016-04-03 ENCOUNTER — Other Ambulatory Visit: Payer: Self-pay | Admitting: Family Medicine

## 2016-05-01 ENCOUNTER — Ambulatory Visit (INDEPENDENT_AMBULATORY_CARE_PROVIDER_SITE_OTHER): Payer: BLUE CROSS/BLUE SHIELD | Admitting: Family Medicine

## 2016-05-01 ENCOUNTER — Encounter: Payer: Self-pay | Admitting: Family Medicine

## 2016-05-01 VITALS — BP 116/70 | HR 60 | Temp 97.9°F | Resp 18 | Ht 61.75 in | Wt 200.2 lb

## 2016-05-01 DIAGNOSIS — Z6836 Body mass index (BMI) 36.0-36.9, adult: Secondary | ICD-10-CM | POA: Diagnosis not present

## 2016-05-01 DIAGNOSIS — Z23 Encounter for immunization: Secondary | ICD-10-CM

## 2016-05-01 DIAGNOSIS — Z0000 Encounter for general adult medical examination without abnormal findings: Secondary | ICD-10-CM | POA: Diagnosis not present

## 2016-05-01 DIAGNOSIS — J301 Allergic rhinitis due to pollen: Secondary | ICD-10-CM

## 2016-05-01 DIAGNOSIS — E785 Hyperlipidemia, unspecified: Secondary | ICD-10-CM

## 2016-05-01 DIAGNOSIS — I10 Essential (primary) hypertension: Secondary | ICD-10-CM | POA: Diagnosis not present

## 2016-05-01 DIAGNOSIS — I2510 Atherosclerotic heart disease of native coronary artery without angina pectoris: Secondary | ICD-10-CM

## 2016-05-01 DIAGNOSIS — I252 Old myocardial infarction: Secondary | ICD-10-CM | POA: Diagnosis not present

## 2016-05-01 DIAGNOSIS — IMO0001 Reserved for inherently not codable concepts without codable children: Secondary | ICD-10-CM

## 2016-05-01 DIAGNOSIS — E119 Type 2 diabetes mellitus without complications: Secondary | ICD-10-CM

## 2016-05-01 DIAGNOSIS — Z124 Encounter for screening for malignant neoplasm of cervix: Secondary | ICD-10-CM | POA: Diagnosis not present

## 2016-05-01 DIAGNOSIS — Z9884 Bariatric surgery status: Secondary | ICD-10-CM

## 2016-05-01 DIAGNOSIS — E6609 Other obesity due to excess calories: Secondary | ICD-10-CM | POA: Diagnosis not present

## 2016-05-01 LAB — COMPREHENSIVE METABOLIC PANEL
ALBUMIN: 4.5 g/dL (ref 3.6–5.1)
ALT: 16 U/L (ref 6–29)
AST: 16 U/L (ref 10–35)
Alkaline Phosphatase: 57 U/L (ref 33–130)
BILIRUBIN TOTAL: 0.8 mg/dL (ref 0.2–1.2)
BUN: 22 mg/dL (ref 7–25)
CALCIUM: 9.6 mg/dL (ref 8.6–10.4)
CHLORIDE: 102 mmol/L (ref 98–110)
CO2: 28 mmol/L (ref 20–31)
Creat: 0.91 mg/dL (ref 0.50–0.99)
Glucose, Bld: 122 mg/dL — ABNORMAL HIGH (ref 65–99)
Potassium: 4.2 mmol/L (ref 3.5–5.3)
Sodium: 139 mmol/L (ref 135–146)
TOTAL PROTEIN: 6.8 g/dL (ref 6.1–8.1)

## 2016-05-01 LAB — POCT URINALYSIS DIP (MANUAL ENTRY)
BILIRUBIN UA: NEGATIVE
BILIRUBIN UA: NEGATIVE
Blood, UA: NEGATIVE
GLUCOSE UA: NEGATIVE
Leukocytes, UA: NEGATIVE
Nitrite, UA: NEGATIVE
Protein Ur, POC: NEGATIVE
SPEC GRAV UA: 1.02
Urobilinogen, UA: 0.2
pH, UA: 5

## 2016-05-01 LAB — LIPID PANEL
CHOLESTEROL: 120 mg/dL (ref <200)
HDL: 45 mg/dL — AB (ref >50)
LDL CALC: 58 mg/dL
TRIGLYCERIDES: 87 mg/dL (ref <150)
Total CHOL/HDL Ratio: 2.7 Ratio (ref <5.0)
VLDL: 17 mg/dL (ref <30)

## 2016-05-01 LAB — HEMOGLOBIN A1C
Hgb A1c MFr Bld: 5.8 % — ABNORMAL HIGH (ref <5.7)
Mean Plasma Glucose: 120 mg/dL

## 2016-05-01 LAB — CBC WITH DIFFERENTIAL/PLATELET
BASOS ABS: 72 {cells}/uL (ref 0–200)
Basophils Relative: 1 %
EOS ABS: 144 {cells}/uL (ref 15–500)
Eosinophils Relative: 2 %
HEMATOCRIT: 37.5 % (ref 35.0–45.0)
HEMOGLOBIN: 12.4 g/dL (ref 11.7–15.5)
LYMPHS ABS: 1728 {cells}/uL (ref 850–3900)
Lymphocytes Relative: 24 %
MCH: 30.7 pg (ref 27.0–33.0)
MCHC: 33.1 g/dL (ref 32.0–36.0)
MCV: 92.8 fL (ref 80.0–100.0)
MONO ABS: 432 {cells}/uL (ref 200–950)
MPV: 9.5 fL (ref 7.5–12.5)
Monocytes Relative: 6 %
NEUTROS PCT: 67 %
Neutro Abs: 4824 cells/uL (ref 1500–7800)
Platelets: 304 10*3/uL (ref 140–400)
RBC: 4.04 MIL/uL (ref 3.80–5.10)
RDW: 12.8 % (ref 11.0–15.0)
WBC: 7.2 10*3/uL (ref 3.8–10.8)

## 2016-05-01 LAB — TSH: TSH: 0.84 m[IU]/L

## 2016-05-01 MED ORDER — ATORVASTATIN CALCIUM 80 MG PO TABS: ORAL_TABLET | ORAL | 3 refills | Status: DC

## 2016-05-01 MED ORDER — ONETOUCH ULTRASOFT LANCETS MISC: 3 refills | Status: DC

## 2016-05-01 MED ORDER — MONTELUKAST SODIUM 10 MG PO TABS: ORAL_TABLET | ORAL | 3 refills | Status: DC

## 2016-05-01 MED ORDER — LOSARTAN POTASSIUM-HCTZ 100-25 MG PO TABS: 1.0000 | ORAL_TABLET | Freq: Every day | ORAL | 3 refills | Status: DC

## 2016-05-01 NOTE — Progress Notes (Signed)
Subjective:    Patient ID: Elaine Jackson, female    DOB: 1956-01-18, 60 y.o.   MRN: [id number]  05/01/2016  Annual Exam (CPE with pelvic exam)   HPI This 60 y.o. female presents for Complete Physical Examination.  Last physical:  12-12-2014 Pap smear: 10-2013 Mammogram:  10-02-2015 Colonoscopy:  2009 Eye exam:  05-2015; yearly Dental exam:  This month  Immunization History  Administered Date(s) Administered  . Hepatitis A 08/13/2010, 03/20/2011  . Hepatitis B 06/24/2010, 08/13/2010, 12/03/2010, 03/20/2011  . Influenza Split 03/20/2011, 03/02/2012  . Influenza,inj,Quad PF,36+ Mos 04/28/2013, 03/14/2014, 05/01/2016  . Influenza-Unspecified 04/19/2015  . Pneumococcal Polysaccharide-23 06/06/2014  . Pneumococcal-Unspecified 07/18/2008  . Td 11/22/2001  . Tdap 09/02/2011  . Zoster 06/16/2015   Wt Readings from Last 3 Encounters:  05/01/16 200 lb 3.2 oz (90.8 kg)  09/26/15 199 lb 6.4 oz (90.4 kg)  08/28/15 201 lb 3.2 oz (91.3 kg)   BP Readings from Last 3 Encounters:  05/01/16 116/70  09/26/15 (!) 108/58  08/28/15 (!) 102/50   Has suffered with jaw pain; has taken NTG three times; last time it happened, it awoke patient from sleep.  Not sure if way sleeping.  Gets a little anxious about it.  Has discussed with cardiology; recommend follow-up in one year.  Very minimal AMI.  Had been to dentist and had a panoramic; mild carotid plaque.  Not exercising; was taking a class.  Mom has moved in with patient.  No other option.  Mother started falling.  Home more than siblings.     Review of Systems  Constitutional: Negative for activity change, appetite change, chills, diaphoresis, fatigue, fever and unexpected weight change.  HENT: Negative for congestion, dental problem, drooling, ear discharge, ear pain, facial swelling, hearing loss, mouth sores, nosebleeds, postnasal drip, rhinorrhea, sinus pressure, sneezing, sore throat, tinnitus, trouble swallowing and voice change.   Eyes:  Negative for photophobia, pain, discharge, redness, itching and visual disturbance.  Respiratory: Negative for apnea, cough, choking, chest tightness, shortness of breath, wheezing and stridor.   Cardiovascular: Negative for chest pain, palpitations and leg swelling.  Gastrointestinal: Negative for abdominal distention, abdominal pain, anal bleeding, blood in stool, constipation, diarrhea, nausea, rectal pain and vomiting.  Endocrine: Negative for cold intolerance, heat intolerance, polydipsia, polyphagia and polyuria.  Genitourinary: Negative for decreased urine volume, difficulty urinating, dyspareunia, dysuria, enuresis, flank pain, frequency, genital sores, hematuria, menstrual problem, pelvic pain, urgency, vaginal bleeding, vaginal discharge and vaginal pain.       Nocturia x 1.  No urinary leakage.  Musculoskeletal: Negative for arthralgias, back pain, gait problem, joint swelling, myalgias, neck pain and neck stiffness.  Skin: Negative for color change, pallor, rash and wound.  Allergic/Immunologic: Negative for environmental allergies, food allergies and immunocompromised state.  Neurological: Negative for dizziness, tremors, seizures, syncope, facial asymmetry, speech difficulty, weakness, light-headedness, numbness and headaches.  Hematological: Negative for adenopathy. Does not bruise/bleed easily.  Psychiatric/Behavioral: Negative for agitation, behavioral problems, confusion, decreased concentration, dysphoric mood, hallucinations, self-injury, sleep disturbance and suicidal ideas. The patient is not nervous/anxious and is not hyperactive.     Past Medical History:  Diagnosis Date  . Allergic rhinitis, cause unspecified   . Allergy   . CAD (coronary artery disease)    LHC 12/20/2014 showed small to moderate apical infarction due to distal LAD stenosis with reestablished TIMI2 flow. No PCI. Recommended plavix for 1 yr and ASA for life.   . Chicken pox   . Diabetes mellitus   .  Family history of breast cancer in first degree relative    sister age 16  . Heart murmur   . Hyperlipidemia   . Hypertension   . Malignant neoplasm skin of face 02/16/2007   Basal Cell Carcinoma  [place].  . Measles   . Obesity, unspecified   . Other chronic nonalcoholic liver disease   . Sleep related leg cramps   . Unspecified vitamin D deficiency    Past Surgical History:  Procedure Laterality Date  . CARDIAC CATHETERIZATION N/A 12/20/2014   Procedure: Left Heart Cath and Coronary Angiography;  Surgeon: Sanda Klein, MD;  Location: [facility];  Service: Cardiovascular;  Laterality: N/A;  . CHOLECYSTECTOMY    . COLONOSCOPY  06/25/2007   Hung. Normal. Repeat in ten years.  Marland Kitchen LAPAROSCOPIC GASTRIC BANDING  07/03/10   Dr. Sherrin Daisy; [facility]  . TONSILLECTOMY     Allergies  Allergen Reactions  . Sulfa Antibiotics Nausea And Vomiting   Current Outpatient Prescriptions  Medication Sig Dispense Refill  . aspirin EC 81 MG EC tablet Take 1 tablet (81 mg total) by mouth daily.    Marland Kitchen atorvastatin (LIPITOR) 80 MG tablet TAKE 1 TABLET (80 MG TOTAL) BY MOUTH DAILY AT 6 PM. 90 tablet 3  . cholecalciferol (VITAMIN D) 1000 UNITS tablet Take 2,000 Units by mouth daily.    . fluticasone (FLONASE) 50 MCG/ACT nasal spray Place 2 sprays into both nostrils daily as needed. For allergies  11  . Lancets (ONETOUCH ULTRASOFT) lancets Use as instructed -- check sugar once daily One touch ultra 2 100 each 3  . losartan-hydrochlorothiazide (HYZAAR) 100-25 MG tablet Take 1 tablet by mouth daily. 90 tablet 3  . montelukast (SINGULAIR) 10 MG tablet TAKE 1 TABLET (10 MG TOTAL) BY MOUTH AT BEDTIME. 90 tablet 3  . nitroGLYCERIN (NITROSTAT) 0.4 MG SL tablet Place 1 tablet (0.4 mg total) under the tongue every 5 (five) minutes x 3 doses as needed for chest pain. 25 tablet 3  . clopidogrel (PLAVIX) 75 MG tablet Take 1 tablet (75 mg total) by mouth daily. (Patient not taking: Reported on 05/01/2016) 90  tablet 3   No current facility-administered medications for this visit.    Social History   Social History  . Marital status: Married    Spouse name: N/A  . Number of children: 1  . Years of education: N/A   Occupational History  . 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 Smoking status: Former Smoker    Packs/day: 1.00    Years: 25.00    Types: Cigarettes  . Smokeless tobacco: Never Used     Comment: quit age 64     smoked from 49 to 65 off and on  . Alcohol use No  . Drug use: No  . Sexual activity: Yes    Birth control/ protection: Post-menopausal   Other Topics Concern  . Not on file   Social History Narrative   Marital status:  Married; x 26 years. Second marriage. Happily married; no abuse      Children: one child, one stepson;two grandsons      Lives: with husband, mother in 2017      Employment:  [employer] part-time 25-30 hours per week; current job x 19 years.      Tobacco:  None      Alcohol: rare/minimal.      Drugs:  None       Exercise: none in 2017  Seatbelt:  100%.       Sunscreen: SPF 15.      Guns:  Maybe?   Family History  Problem Relation Age of Onset  . Diabetes Mother   . Heart disease Mother     defibrillator; carotid artery stenosis.  . Hypertension Mother   . Hyperlipidemia Mother   . Dementia Mother   . Congestive Heart Failure Mother   . Heart disease Father   . Kidney failure Father     ESRD/peritoneal dialysis  . Diabetes Father   . Hypertension Father   . Hyperlipidemia Father   . Stroke Father   . Kidney disease Father   . Congestive Heart Failure Father   . Diabetes Sister   . Hypertension Sister   . Kidney disease Sister     renal failure  . Diabetes Brother   . Hypertension Brother   . Hyperlipidemia Brother   . Arthritis Brother   . Cervical cancer Sister   . Hypertension Sister   . Liver cancer Sister 46  . Stroke Maternal Grandmother   . Heart disease Maternal Grandfather   . Stroke  Paternal Grandmother   . Heart disease Paternal Grandmother   . Heart disease Paternal Grandfather   . Stroke Paternal Grandfather   . Breast cancer Sister 38  . Hepatitis C Sister 56       Objective:    BP 116/70   Pulse 60   Temp 97.9 F (36.6 C) (Oral)   Resp 18   Ht 5' 1.75" (1.568 m)   Wt 200 lb 3.2 oz (90.8 kg)   SpO2 99%   BMI 36.91 kg/m  Physical Exam  Constitutional: She is oriented to person, place, and time. She appears well-developed and well-nourished. No distress.  HENT:  Head: Normocephalic and atraumatic.  Right Ear: External ear normal.  Left Ear: External ear normal.  Nose: Nose normal.  Mouth/Throat: Oropharynx is clear and moist.  Eyes: Conjunctivae and EOM are normal. Pupils are equal, round, and reactive to light.  Neck: Normal range of motion and full passive range of motion without pain. Neck supple. No JVD present. Carotid bruit is not present. No thyromegaly present.  Cardiovascular: Normal rate, regular rhythm and normal heart sounds.  Exam reveals no gallop and no friction rub.   No murmur heard. Pulmonary/Chest: Effort normal and breath sounds normal. She has no wheezes. She has no rales. Right breast exhibits no inverted nipple, no mass, no nipple discharge, no skin change and no tenderness. Left breast exhibits no inverted nipple, no mass, no nipple discharge, no skin change and no tenderness. Breasts are symmetrical.  Abdominal: Soft. Bowel sounds are normal. She exhibits no distension and no mass. There is no tenderness. There is no rebound and no guarding.  Genitourinary: Vagina normal. There is no rash, tenderness, lesion or injury on the right labia. There is no rash, tenderness, lesion or injury on the left labia. Cervix exhibits no motion tenderness, no discharge and no friability. Right adnexum displays no mass, no tenderness and no fullness. Left adnexum displays no mass, no tenderness and no fullness.  Musculoskeletal:       Right  shoulder: Normal.       Left shoulder: Normal.       Cervical back: Normal.  Lymphadenopathy:    She has no cervical adenopathy.  Neurological: She is alert and oriented to person, place, and time. She has normal reflexes. No cranial nerve deficit. She exhibits normal muscle tone. Coordination  normal.  Skin: Skin is warm and dry. No rash noted. She is not diaphoretic. No erythema. No pallor.  Psychiatric: She has a normal mood and affect. Her behavior is normal. Judgment and thought content normal.  Nursing note and vitals reviewed.  Results for orders placed or performed in visit on 05/01/16  CBC with Differential/Platelet  Result Value Ref Range   WBC 7.2 3.8 - 10.8 K/uL   RBC 4.04 3.80 - 5.10 MIL/uL   Hemoglobin 12.4 11.7 - 15.5 g/dL   HCT 37.5 35.0 - 45.0 %   MCV 92.8 80.0 - 100.0 fL   MCH 30.7 27.0 - 33.0 pg   MCHC 33.1 32.0 - 36.0 g/dL   RDW 12.8 11.0 - 15.0 %   Platelets 304 140 - 400 K/uL   MPV 9.5 7.5 - 12.5 fL   Neutro Abs 4,824 1,500 - 7,800 cells/uL   Lymphs Abs 1,728 850 - 3,900 cells/uL   Monocytes Absolute 432 200 - 950 cells/uL   Eosinophils Absolute 144 15 - 500 cells/uL   Basophils Absolute 72 0 - 200 cells/uL   Neutrophils Relative % 67 %   Lymphocytes Relative 24 %   Monocytes Relative 6 %   Eosinophils Relative 2 %   Basophils Relative 1 %   Smear Review Criteria for review not met   Comprehensive metabolic panel  Result Value Ref Range   Sodium 139 135 - 146 mmol/L   Potassium 4.2 3.5 - 5.3 mmol/L   Chloride 102 98 - 110 mmol/L   CO2 28 20 - 31 mmol/L   Glucose, Bld 122 (H) 65 - 99 mg/dL   BUN 22 7 - 25 mg/dL   Creat 0.91 0.50 - 0.99 mg/dL   Total Bilirubin 0.8 0.2 - 1.2 mg/dL   Alkaline Phosphatase 57 33 - 130 U/L   AST 16 10 - 35 U/L   ALT 16 6 - 29 U/L   Total Protein 6.8 6.1 - 8.1 g/dL   Albumin 4.5 3.6 - 5.1 g/dL   Calcium 9.6 8.6 - 10.4 mg/dL  Lipid panel  Result Value Ref Range   Cholesterol 120 <200 mg/dL   Triglycerides 87 <150 mg/dL    HDL 45 (L) >50 mg/dL   Total CHOL/HDL Ratio 2.7 <5.0 Ratio   VLDL 17 <30 mg/dL   LDL Cholesterol 58 mg/dL  Microalbumin, urine  Result Value Ref Range   Microalb, Ur 1.2 Not estab mg/dL  Hemoglobin A1c  Result Value Ref Range   Hgb A1c MFr Bld 5.8 (H) <5.7 %   Mean Plasma Glucose 120 mg/dL  TSH  Result Value Ref Range   TSH 0.84 mIU/L  POCT urinalysis dipstick  Result Value Ref Range   Color, UA yellow yellow   Clarity, UA clear clear   Glucose, UA negative negative   Bilirubin, UA negative negative   Ketones, POC UA negative negative   Spec Grav, UA 1.020    Blood, UA negative negative   pH, UA 5.0    Protein Ur, POC negative negative   Urobilinogen, UA 0.2    Nitrite, UA Negative Negative   Leukocytes, UA Negative Negative  Pap IG and HPV (high risk) DNA detection  Result Value Ref Range   HPV DNA High Risk Not Detected    Specimen adequacy: SEE NOTE    FINAL DIAGNOSIS: SEE NOTE    COMMENTS: SEE NOTE    Cytotechnologist: SEE NOTE    Depression screen [facility] 2/9 05/01/2016 09/26/2015 05/15/2015 12/19/2014 12/12/2014  Decreased Interest 0 0 0 0 0  Down, Depressed, Hopeless 0 0 0 0 0  PHQ - 2 Score 0 0 0 0 0   Fall Risk  05/01/2016 09/26/2015 05/15/2015 12/12/2014 09/05/2014  Falls in the past year? No No No No No       Assessment & Plan:   1. Routine physical examination   2. Essential hypertension, benign   3. Coronary artery disease involving native coronary artery of native heart without angina pectoris   4. Old MI (myocardial infarction)   5. Controlled type 2 diabetes mellitus without complication, without long-term current use of insulin ([place])   6. Hx of laparoscopic gastric banding   7. Dyslipidemia   8. Cervical cancer screening   9. Flu vaccine need   10. Chronic seasonal allergic rhinitis due to pollen   11. Class 2 obesity due to excess calories with serious comorbidity and body mass index (BMI) of 36.0 to 36.9 in adult    -anticipatory guidance ---  exercise, weight loss, three servings of calcium daily. -obtain age appropriate screening labs. -refills provided. -pap smear obtained. -intermittent jaw pain; in persists, recommend follow-up early with cardiology.  Orders Placed This Encounter  Procedures  . Flu Vaccine QUAD 36+ mos IM  . CBC with Differential/Platelet  . Comprehensive metabolic panel    Order Specific Question:   Has the patient fasted?    Answer:   Yes  . Lipid panel    Order Specific Question:   Has the patient fasted?    Answer:   Yes  . Microalbumin, urine  . Hemoglobin A1c  . TSH  . POCT urinalysis dipstick  . EKG 12-Lead   Meds ordered this encounter  Medications  . atorvastatin (LIPITOR) 80 MG tablet    Sig: TAKE 1 TABLET (80 MG TOTAL) BY MOUTH DAILY AT 6 PM.    Dispense:  90 tablet    Refill:  3  . Lancets (ONETOUCH ULTRASOFT) lancets    Sig: Use as instructed -- check sugar once daily One touch ultra 2    Dispense:  100 each    Refill:  3  . losartan-hydrochlorothiazide (HYZAAR) 100-25 MG tablet    Sig: Take 1 tablet by mouth daily.    Dispense:  90 tablet    Refill:  3  . montelukast (SINGULAIR) 10 MG tablet    Sig: TAKE 1 TABLET (10 MG TOTAL) BY MOUTH AT BEDTIME.    Dispense:  90 tablet    Refill:  3    Return in about 6 months (around 10/29/2016) for recheck.   Dung Prien Elayne Guerin, M.D. [facility] [address] [place], [place]  [address] [phone] phone [phone] fax

## 2016-05-01 NOTE — Patient Instructions (Signed)
Keeping You Healthy  Get These Tests  Blood Pressure- Have your blood pressure checked by your healthcare provider at least once a year.  Normal blood pressure is 120/80.  Weight- Have your body mass index (BMI) calculated to screen for obesity.  BMI is a measure of body fat based on height and weight.  You can calculate your own BMI at [employer]  Cholesterol- Have your cholesterol checked every year.  Diabetes- Have your blood sugar checked every year if you have high blood pressure, high cholesterol, a family history of diabetes or if you are overweight.  Pap Test - Have a pap test every 1 to 5 years if you have been sexually active.  If you are older than 65 and recent pap tests have been normal you may not need additional pap tests.  In addition, if you have had a hysterectomy  for benign disease additional pap tests are not necessary.  Mammogram-Yearly mammograms are essential for early detection of breast cancer  Screening for Colon Cancer- Colonoscopy starting at age 50. Screening may begin sooner depending on your family history and other health conditions.  Follow up colonoscopy as directed by your Gastroenterologist.  Screening for Osteoporosis- Screening begins at age 33 with bone density scanning, sooner if you are at higher risk for developing Osteoporosis.  Get these medicines  Calcium with Vitamin D- Your body requires 1200-1500 mg of Calcium a day and [phone] IU of Vitamin D a day.  You can only absorb 500 mg of Calcium at a time therefore Calcium must be taken in 2 or 3 separate doses throughout the day.  Hormones- Hormone therapy has been associated with increased risk for certain cancers and heart disease.  Talk to your healthcare provider about if you need relief from menopausal symptoms.  Aspirin- Ask your healthcare provider about taking Aspirin to prevent Heart Disease and Stroke.  Get these Immuniztions  Flu shot- Every fall  Pneumonia shot-  Once after the age of 38; if you are younger ask your healthcare provider if you need a pneumonia shot.  Tetanus- Every ten years.  Zostavax- Once after the age of 47 to prevent shingles.  Take these steps  Don't smoke- Your healthcare provider can help you quit. For tips on how to quit, ask your healthcare provider or go to www.smokefree.gov or call 1-800 QUIT-NOW.  Be physically active- Exercise 5 days a week for a minimum of 30 minutes.  If you are not already physically active, start slow and gradually work up to 30 minutes of moderate physical activity.  Try walking, dancing, bike riding, swimming, etc.  Eat a healthy diet- Eat a variety of healthy foods such as fruits, vegetables, whole grains, low fat milk, low fat cheeses, yogurt, lean meats, chicken, fish, eggs, dried beans, tofu, etc.  For more information go to www.thenutritionsource.org  Dental visit- Brush and floss teeth twice daily; visit your dentist twice a year.  Eye exam- Visit your Optometrist or Ophthalmologist yearly.  Drink alcohol in moderation- Limit alcohol intake to one drink or less a day.  Never drink and drive.  Depression- Your emotional health is as important as your physical health.  If you're feeling down or losing interest in things you normally enjoy, please talk to your healthcare provider.  Seat Belts- can save your life; always wear one  Smoke/Carbon Monoxide detectors- These detectors need to be installed on the appropriate level of your home.  Replace batteries at least once a year.  Violence- If  anyone is threatening or hurting you, please tell your healthcare provider.  Living Will/ Health care power of attorney- Discuss with your healthcare provider and family.      IF you received an x-ray today, you will receive an invoice from [facility]. Please contact [facility] at [phone] with questions or concerns regarding your invoice.   IF you received labwork today,  you will receive an invoice from [employer]. Please contact [facility] at [phone] with questions or concerns regarding your invoice.   Our billing staff will not be able to assist you with questions regarding bills from these companies.  You will be contacted with the lab results as soon as they are available. The fastest way to get your results is to activate your My Chart account. Instructions are located on the last page of this paperwork. If you have not heard from [place] regarding the results in 2 weeks, please contact this office.

## 2016-05-02 ENCOUNTER — Encounter (HOSPITAL_COMMUNITY): Payer: Self-pay

## 2016-05-02 LAB — MICROALBUMIN, URINE: Microalb, Ur: 1.2 mg/dL

## 2016-05-06 LAB — PAP IG AND HPV HIGH-RISK: HPV DNA High Risk: NOT DETECTED

## 2016-05-20 DIAGNOSIS — J301 Allergic rhinitis due to pollen: Secondary | ICD-10-CM | POA: Insufficient documentation

## 2016-08-13 ENCOUNTER — Other Ambulatory Visit: Payer: Self-pay | Admitting: Family Medicine

## 2016-08-20 ENCOUNTER — Telehealth: Payer: Self-pay

## 2016-08-20 ENCOUNTER — Emergency Department (HOSPITAL_COMMUNITY): Payer: BLUE CROSS/BLUE SHIELD

## 2016-08-20 ENCOUNTER — Inpatient Hospital Stay (HOSPITAL_COMMUNITY)
Admission: EM | Admit: 2016-08-20 | Discharge: 2016-08-23 | DRG: 260 | Disposition: A | Discharge status: Rehabilitation Facility | Payer: BLUE CROSS/BLUE SHIELD | Attending: Family Medicine | Admitting: Family Medicine

## 2016-08-20 ENCOUNTER — Telehealth: Payer: Self-pay | Admitting: Cardiology

## 2016-08-20 ENCOUNTER — Observation Stay (HOSPITAL_COMMUNITY): Payer: BLUE CROSS/BLUE SHIELD

## 2016-08-20 ENCOUNTER — Encounter (HOSPITAL_COMMUNITY): Payer: Self-pay | Admitting: *Deleted

## 2016-08-20 DIAGNOSIS — IMO0001 Reserved for inherently not codable concepts without codable children: Secondary | ICD-10-CM

## 2016-08-20 DIAGNOSIS — I259 Chronic ischemic heart disease, unspecified: Secondary | ICD-10-CM | POA: Diagnosis not present

## 2016-08-20 DIAGNOSIS — I480 Paroxysmal atrial fibrillation: Secondary | ICD-10-CM | POA: Diagnosis present

## 2016-08-20 DIAGNOSIS — E1169 Type 2 diabetes mellitus with other specified complication: Secondary | ICD-10-CM

## 2016-08-20 DIAGNOSIS — Z6836 Body mass index (BMI) 36.0-36.9, adult: Secondary | ICD-10-CM

## 2016-08-20 DIAGNOSIS — Z8049 Family history of malignant neoplasm of other genital organs: Secondary | ICD-10-CM

## 2016-08-20 DIAGNOSIS — Z9049 Acquired absence of other specified parts of digestive tract: Secondary | ICD-10-CM

## 2016-08-20 DIAGNOSIS — I081 Rheumatic disorders of both mitral and tricuspid valves: Secondary | ICD-10-CM | POA: Diagnosis present

## 2016-08-20 DIAGNOSIS — R8290 Unspecified abnormal findings in urine: Secondary | ICD-10-CM | POA: Diagnosis present

## 2016-08-20 DIAGNOSIS — Z9884 Bariatric surgery status: Secondary | ICD-10-CM

## 2016-08-20 DIAGNOSIS — E7800 Pure hypercholesterolemia, unspecified: Secondary | ICD-10-CM | POA: Diagnosis not present

## 2016-08-20 DIAGNOSIS — Z800 Family history of malignant neoplasm of digestive organs: Secondary | ICD-10-CM

## 2016-08-20 DIAGNOSIS — I2510 Atherosclerotic heart disease of native coronary artery without angina pectoris: Secondary | ICD-10-CM | POA: Diagnosis not present

## 2016-08-20 DIAGNOSIS — G450 Vertebro-basilar artery syndrome: Secondary | ICD-10-CM | POA: Diagnosis not present

## 2016-08-20 DIAGNOSIS — I10 Essential (primary) hypertension: Secondary | ICD-10-CM | POA: Diagnosis present

## 2016-08-20 DIAGNOSIS — Z8249 Family history of ischemic heart disease and other diseases of the circulatory system: Secondary | ICD-10-CM

## 2016-08-20 DIAGNOSIS — N179 Acute kidney failure, unspecified: Secondary | ICD-10-CM | POA: Diagnosis not present

## 2016-08-20 DIAGNOSIS — K5901 Slow transit constipation: Secondary | ICD-10-CM | POA: Diagnosis present

## 2016-08-20 DIAGNOSIS — Z803 Family history of malignant neoplasm of breast: Secondary | ICD-10-CM

## 2016-08-20 DIAGNOSIS — R51 Headache: Secondary | ICD-10-CM

## 2016-08-20 DIAGNOSIS — Z7902 Long term (current) use of antithrombotics/antiplatelets: Secondary | ICD-10-CM

## 2016-08-20 DIAGNOSIS — R4781 Slurred speech: Secondary | ICD-10-CM | POA: Diagnosis present

## 2016-08-20 DIAGNOSIS — I7774 Dissection of vertebral artery: Principal | ICD-10-CM | POA: Diagnosis present

## 2016-08-20 DIAGNOSIS — Z841 Family history of disorders of kidney and ureter: Secondary | ICD-10-CM

## 2016-08-20 DIAGNOSIS — Z8261 Family history of arthritis: Secondary | ICD-10-CM

## 2016-08-20 DIAGNOSIS — I252 Old myocardial infarction: Secondary | ICD-10-CM

## 2016-08-20 DIAGNOSIS — E1151 Type 2 diabetes mellitus with diabetic peripheral angiopathy without gangrene: Secondary | ICD-10-CM | POA: Diagnosis present

## 2016-08-20 DIAGNOSIS — Z8673 Personal history of transient ischemic attack (TIA), and cerebral infarction without residual deficits: Secondary | ICD-10-CM

## 2016-08-20 DIAGNOSIS — Z823 Family history of stroke: Secondary | ICD-10-CM

## 2016-08-20 DIAGNOSIS — E559 Vitamin D deficiency, unspecified: Secondary | ICD-10-CM | POA: Diagnosis present

## 2016-08-20 DIAGNOSIS — Z882 Allergy status to sulfonamides status: Secondary | ICD-10-CM

## 2016-08-20 DIAGNOSIS — R269 Unspecified abnormalities of gait and mobility: Secondary | ICD-10-CM

## 2016-08-20 DIAGNOSIS — R001 Bradycardia, unspecified: Secondary | ICD-10-CM | POA: Diagnosis present

## 2016-08-20 DIAGNOSIS — E6609 Other obesity due to excess calories: Secondary | ICD-10-CM | POA: Diagnosis present

## 2016-08-20 DIAGNOSIS — G43909 Migraine, unspecified, not intractable, without status migrainosus: Secondary | ICD-10-CM | POA: Diagnosis present

## 2016-08-20 DIAGNOSIS — E119 Type 2 diabetes mellitus without complications: Secondary | ICD-10-CM

## 2016-08-20 DIAGNOSIS — I639 Cerebral infarction, unspecified: Secondary | ICD-10-CM | POA: Diagnosis present

## 2016-08-20 DIAGNOSIS — E785 Hyperlipidemia, unspecified: Secondary | ICD-10-CM | POA: Diagnosis present

## 2016-08-20 DIAGNOSIS — Z7951 Long term (current) use of inhaled steroids: Secondary | ICD-10-CM

## 2016-08-20 DIAGNOSIS — Z833 Family history of diabetes mellitus: Secondary | ICD-10-CM

## 2016-08-20 DIAGNOSIS — R42 Dizziness and giddiness: Secondary | ICD-10-CM

## 2016-08-20 DIAGNOSIS — E118 Type 2 diabetes mellitus with unspecified complications: Secondary | ICD-10-CM | POA: Diagnosis not present

## 2016-08-20 DIAGNOSIS — Z8669 Personal history of other diseases of the nervous system and sense organs: Secondary | ICD-10-CM

## 2016-08-20 DIAGNOSIS — Z87891 Personal history of nicotine dependence: Secondary | ICD-10-CM

## 2016-08-20 DIAGNOSIS — E041 Nontoxic single thyroid nodule: Secondary | ICD-10-CM | POA: Diagnosis present

## 2016-08-20 DIAGNOSIS — E669 Obesity, unspecified: Secondary | ICD-10-CM | POA: Diagnosis present

## 2016-08-20 DIAGNOSIS — Z85828 Personal history of other malignant neoplasm of skin: Secondary | ICD-10-CM

## 2016-08-20 DIAGNOSIS — R519 Headache, unspecified: Secondary | ICD-10-CM

## 2016-08-20 DIAGNOSIS — Z7982 Long term (current) use of aspirin: Secondary | ICD-10-CM

## 2016-08-20 LAB — BASIC METABOLIC PANEL
Anion gap: 13 (ref 5–15)
BUN: 26 mg/dL — ABNORMAL HIGH (ref 6–20)
CHLORIDE: 102 mmol/L (ref 101–111)
CO2: 22 mmol/L (ref 22–32)
CREATININE: 1.13 mg/dL — AB (ref 0.44–1.00)
Calcium: 9.8 mg/dL (ref 8.9–10.3)
GFR calc non Af Amer: 51 mL/min — ABNORMAL LOW (ref >60)
GFR, EST AFRICAN AMERICAN: 60 mL/min — AB (ref >60)
Glucose, Bld: 177 mg/dL — ABNORMAL HIGH (ref 65–99)
POTASSIUM: 4.4 mmol/L (ref 3.5–5.1)
Sodium: 137 mmol/L (ref 135–145)

## 2016-08-20 LAB — URINALYSIS, ROUTINE W REFLEX MICROSCOPIC
BILIRUBIN URINE: NEGATIVE
Glucose, UA: NEGATIVE mg/dL
Hgb urine dipstick: NEGATIVE
KETONES UR: NEGATIVE mg/dL
Nitrite: NEGATIVE
PROTEIN: NEGATIVE mg/dL
Specific Gravity, Urine: 1.014 (ref 1.005–1.030)
pH: 5 (ref 5.0–8.0)

## 2016-08-20 LAB — I-STAT CHEM 8, ED
BUN: 35 mg/dL — ABNORMAL HIGH (ref 6–20)
CALCIUM ION: 1.19 mmol/L (ref 1.15–1.40)
CREATININE: 1.2 mg/dL — AB (ref 0.44–1.00)
Chloride: 102 mmol/L (ref 101–111)
GLUCOSE: 181 mg/dL — AB (ref 65–99)
HCT: 37 % (ref 36.0–46.0)
HEMOGLOBIN: 12.6 g/dL (ref 12.0–15.0)
Potassium: 4.4 mmol/L (ref 3.5–5.1)
Sodium: 139 mmol/L (ref 135–145)
TCO2: 28 mmol/L (ref 0–100)

## 2016-08-20 LAB — CBC
HEMATOCRIT: 37.8 % (ref 36.0–46.0)
HEMOGLOBIN: 12.2 g/dL (ref 12.0–15.0)
MCH: 30.2 pg (ref 26.0–34.0)
MCHC: 32.3 g/dL (ref 30.0–36.0)
MCV: 93.6 fL (ref 78.0–100.0)
PLATELETS: 243 10*3/uL (ref 150–400)
RBC: 4.04 MIL/uL (ref 3.87–5.11)
RDW: 13.5 % (ref 11.5–15.5)
WBC: 9 10*3/uL (ref 4.0–10.5)

## 2016-08-20 LAB — CBG MONITORING, ED: GLUCOSE-CAPILLARY: 136 mg/dL — AB (ref 65–99)

## 2016-08-20 LAB — DIFFERENTIAL
Basophils Absolute: 0.1 10*3/uL (ref 0.0–0.1)
Basophils Relative: 1 %
EOS PCT: 2 %
Eosinophils Absolute: 0.1 10*3/uL (ref 0.0–0.7)
LYMPHS ABS: 2.7 10*3/uL (ref 0.7–4.0)
LYMPHS PCT: 31 %
MONO ABS: 0.6 10*3/uL (ref 0.1–1.0)
Monocytes Relative: 7 %
NEUTROS ABS: 5.1 10*3/uL (ref 1.7–7.7)
Neutrophils Relative %: 59 %

## 2016-08-20 LAB — PROTIME-INR
INR: 0.97
Prothrombin Time: 12.9 seconds (ref 11.4–15.2)

## 2016-08-20 LAB — GLUCOSE, CAPILLARY: Glucose-Capillary: 148 mg/dL — ABNORMAL HIGH (ref 65–99)

## 2016-08-20 LAB — TSH: TSH: 0.564 u[IU]/mL (ref 0.350–4.500)

## 2016-08-20 LAB — I-STAT TROPONIN, ED: TROPONIN I, POC: 0.01 ng/mL (ref 0.00–0.08)

## 2016-08-20 LAB — RAPID URINE DRUG SCREEN, HOSP PERFORMED
AMPHETAMINES: NOT DETECTED
BARBITURATES: NOT DETECTED
BENZODIAZEPINES: NOT DETECTED
COCAINE: NOT DETECTED
Opiates: NOT DETECTED
Tetrahydrocannabinol: NOT DETECTED

## 2016-08-20 LAB — ETHANOL

## 2016-08-20 LAB — T4, FREE: FREE T4: 1.09 ng/dL (ref 0.61–1.12)

## 2016-08-20 LAB — APTT: APTT: 21 s — AB (ref 24–36)

## 2016-08-20 MED ORDER — CLOPIDOGREL BISULFATE 75 MG PO TABS
75.0000 mg | ORAL_TABLET | Freq: Every day | ORAL | Status: DC
  Administered 2016-08-21 – 2016-08-23 (×3): 75 mg via ORAL
  Filled 2016-08-20 (×3): qty 1

## 2016-08-20 MED ORDER — ASPIRIN EC 325 MG PO TBEC
325.0000 mg | DELAYED_RELEASE_TABLET | Freq: Every day | ORAL | Status: DC
  Administered 2016-08-21 – 2016-08-23 (×3): 325 mg via ORAL
  Filled 2016-08-20 (×3): qty 1

## 2016-08-20 MED ORDER — HYDRALAZINE HCL 20 MG/ML IJ SOLN: 5.0000 mg | INTRAMUSCULAR | Status: DC | PRN

## 2016-08-20 MED ORDER — ATORVASTATIN CALCIUM 80 MG PO TABS
80.0000 mg | ORAL_TABLET | Freq: Every day | ORAL | Status: DC
  Administered 2016-08-21 – 2016-08-22 (×2): 80 mg via ORAL
  Filled 2016-08-20 (×2): qty 1

## 2016-08-20 MED ORDER — MONTELUKAST SODIUM 10 MG PO TABS
10.0000 mg | ORAL_TABLET | Freq: Every day | ORAL | Status: DC
  Administered 2016-08-20 – 2016-08-22 (×3): 10 mg via ORAL
  Filled 2016-08-20 (×3): qty 1

## 2016-08-20 MED ORDER — METOCLOPRAMIDE HCL 5 MG/ML IJ SOLN
10.0000 mg | Freq: Once | INTRAMUSCULAR | Status: DC
  Filled 2016-08-20: qty 2

## 2016-08-20 MED ORDER — ENOXAPARIN SODIUM 40 MG/0.4ML ~~LOC~~ SOLN
40.0000 mg | SUBCUTANEOUS | Status: DC
  Administered 2016-08-20 – 2016-08-22 (×3): 40 mg via SUBCUTANEOUS
  Filled 2016-08-20 (×3): qty 0.4

## 2016-08-20 MED ORDER — SODIUM CHLORIDE 0.9 % IV BOLUS (SEPSIS)
1000.0000 mL | Freq: Once | INTRAVENOUS | Status: AC
  Administered 2016-08-20: 1000 mL via INTRAVENOUS

## 2016-08-20 MED ORDER — CLOPIDOGREL BISULFATE 75 MG PO TABS
300.0000 mg | ORAL_TABLET | Freq: Once | ORAL | Status: DC
  Filled 2016-08-20 (×2): qty 4

## 2016-08-20 MED ORDER — INSULIN ASPART 100 UNIT/ML ~~LOC~~ SOLN
0.0000 [IU] | SUBCUTANEOUS | Status: DC
  Administered 2016-08-21 (×2): 1 [IU] via SUBCUTANEOUS
  Administered 2016-08-21: 2 [IU] via SUBCUTANEOUS
  Administered 2016-08-21 – 2016-08-22 (×3): 1 [IU] via SUBCUTANEOUS

## 2016-08-20 MED ORDER — DIPHENHYDRAMINE HCL 50 MG/ML IJ SOLN
25.0000 mg | Freq: Once | INTRAMUSCULAR | Status: DC
  Filled 2016-08-20: qty 1

## 2016-08-20 MED ORDER — ONDANSETRON HCL 4 MG/2ML IJ SOLN
4.0000 mg | Freq: Once | INTRAMUSCULAR | Status: AC
  Administered 2016-08-20: 4 mg via INTRAVENOUS
  Filled 2016-08-20: qty 2

## 2016-08-20 MED ORDER — ACETAMINOPHEN 325 MG PO TABS
650.0000 mg | ORAL_TABLET | ORAL | Status: DC | PRN
  Administered 2016-08-21 (×2): 650 mg via ORAL
  Filled 2016-08-20 (×2): qty 2

## 2016-08-20 MED ORDER — VITAMIN D 1000 UNITS PO TABS
2000.0000 [IU] | ORAL_TABLET | Freq: Every day | ORAL | Status: DC
  Administered 2016-08-21 – 2016-08-23 (×3): 2000 [IU] via ORAL
  Filled 2016-08-20 (×3): qty 2

## 2016-08-20 MED ORDER — STROKE: EARLY STAGES OF RECOVERY BOOK: Freq: Once | Status: DC

## 2016-08-20 MED ORDER — ACETAMINOPHEN 160 MG/5ML PO SOLN: 650.0000 mg | ORAL | Status: DC | PRN

## 2016-08-20 MED ORDER — MORPHINE SULFATE (PF) 4 MG/ML IV SOLN
4.0000 mg | Freq: Once | INTRAVENOUS | Status: DC
  Filled 2016-08-20: qty 1

## 2016-08-20 MED ORDER — ASPIRIN 81 MG PO CHEW: 81.0000 mg | CHEWABLE_TABLET | Freq: Every day | ORAL | Status: DC

## 2016-08-20 MED ORDER — IOPAMIDOL (ISOVUE-370) INJECTION 76%
INTRAVENOUS | Status: AC
  Administered 2016-08-20: 50 mL
  Filled 2016-08-20: qty 50

## 2016-08-20 MED ORDER — ACETAMINOPHEN 650 MG RE SUPP: 650.0000 mg | RECTAL | Status: DC | PRN

## 2016-08-20 MED ORDER — SODIUM CHLORIDE 0.9 % IV SOLN
Freq: Once | INTRAVENOUS | Status: AC
  Administered 2016-08-20: 13:00:00 via INTRAVENOUS

## 2016-08-20 NOTE — ED Triage Notes (Signed)
Per EMS- pt reports sudden onset of dizziness at 1030 today. Pt reports N/V and dizziness worsens with eye open and movement.

## 2016-08-20 NOTE — H&P (Signed)
History and Physical    Elaine Jackson [id number] DOB: Nov 16, 1955 DOA: 08/20/2016   PCP: Reginia Forts, MD   Patient coming from/Resides with: Private residence/husband  Admission status: Observation/telemetry -may be medically necessary to stay a minimum 2 midnights to rule out impending and/or unexpected changes in physiologic status that may differ from initial evaluation performed in the ER and/or at time of admission therefore consider reevaluation of admission status in 24 hours.   Chief Complaint: Dizziness  HPI: Elaine Jackson is a 61 y.o. female with medical history significant for CAD, hypertension, diabetes, chronic bradycardia, prior laparoscopic gastric banding, vitamin D deficiency and dyslipidemia. 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 She thought she was having a migraine. She subsequently had dry heaves 3 episodes and then developed pain/headache in the right neck/right temporal region/right periorbital region. She was brought to the ER as a code stroke with initial CT of the head within normal limits. She otherwise has not had any significant focal neurological deficits. While undergoing one of her CT scans she had an unusual sensation of perioral and tongue numbness with a sensation of her throat tightening/high-pitched voice which cleared spontaneously and has recurred only one time since returning to the ER area. She has been evaluated by neurology and has subsequently undergone CT angiogram of the head and neck which revealed a stable right vertebral artery dissection which would not be amenable to intervention with plans to treat with aspirin and Plavix.  ED Course:  Vital Signs: BP 168/82   Pulse (!) 43   Temp 97.6 F (36.4 C) (Oral)   Resp 22   SpO2 99%  CT head without contrast: No acute changes CTA head and neck: Tapering of the right vertebral artery, which becomes occluded at the level of C5.  The appearance is concerning for dissection. There is also nonopacification of the right anterior and posterior inferior cerebellar arteries. Given the clinical context, the findings are compatible with posterior fossa ischemia. Further evaluation with MRI may be helpful to evaluate for acute cerebellar infarct Lab data: Sodium 137, potassium 4.4, chloride 102, CO2 22, glucose 177, BUN 26, creatinine 1.13, POC troponin 0.01, white count 9000 normal differential, hemoglobin 12.2, platelets 243,000, coags normal, urinalysis slightly abnormal with rare bacteria, small leukocytes, 6-30 WBCs; alcohol level <5, urine drug screen negative Medications and treatments: Zofran 4 mg IV 1, normal saline 1 L  Review of Systems:  In addition to the HPI above,  No Fever-chills, myalgias or other constitutional symptoms No Headache, changes with Vision or hearing, new weakness, tingling, numbness in any extremity, dysarthria or word finding difficulty, gait disturbance or imbalance, tremors or seizure activity No problems swallowing food or Liquids, indigestion/reflux, choking or coughing while eating, abdominal pain with or after eating No Chest pain, Cough or Shortness of Breath, palpitations, orthopnea or DOE No Abdominal pain, N/V, melena,hematochezia, dark tarry stools, constipation No dysuria, malodorous urine, hematuria or flank pain No new skin rashes, lesions, masses or bruises, No new joint pains, aches, swelling or redness No recent unintentional weight gain or loss No polyuria, polydypsia or polyphagia   Past Medical History:  Diagnosis Date  . Allergic rhinitis, cause unspecified   . Allergy   . CAD (coronary artery disease)    LHC 12/20/2014 showed small to moderate apical infarction due to distal LAD stenosis with reestablished TIMI2 flow. No PCI. Recommended plavix for 1 yr and ASA for life.   . Chicken  pox   . Diabetes mellitus   . Family history of breast cancer in first degree relative     sister age 81  . Heart murmur   . Hyperlipidemia   . Hypertension   . Malignant neoplasm skin of face 02/16/2007   Basal Cell Carcinoma  [place].  . Measles   . Obesity, unspecified   . Other chronic nonalcoholic liver disease   . Sleep related leg cramps   . Unspecified vitamin D deficiency     Past Surgical History:  Procedure Laterality Date  . CARDIAC CATHETERIZATION N/A 12/20/2014   Procedure: Left Heart Cath and Coronary Angiography;  Surgeon: Sanda Klein, MD;  Location: [facility];  Service: Cardiovascular;  Laterality: N/A;  . CHOLECYSTECTOMY    . COLONOSCOPY  06/25/2007   [place]. Normal. Repeat in ten years.  Marland Kitchen LAPAROSCOPIC GASTRIC BANDING  07/03/10   Dr. Sherrin Daisy; [facility]  . TONSILLECTOMY      Social History   Social History  . Marital status: Married    Spouse name: N/A  . Number of children: 1  . Years of education: N/A   Occupational History  . 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 Smoking status: Former Smoker    Packs/day: 1.00    Years: 25.00    Types: Cigarettes  . Smokeless tobacco: Never Used     Comment: quit age 72     smoked from 42 to 51 off and on  . Alcohol use No  . Drug use: No  . Sexual activity: Yes    Birth control/ protection: Post-menopausal   Other Topics Concern  . Not on file   Social History Narrative   Marital status:  Married; x 26 years. Second marriage. Happily married; no abuse      Children: one child, one stepson;two grandsons      Lives: with husband, mother in 2017      Employment:  [employer] part-time 25-30 hours per week; current job x 19 years.      Tobacco:  None      Alcohol: rare/minimal.      Drugs:  None       Exercise: none in 2017       Seatbelt:  100%.       Sunscreen: SPF 15.      Guns:  Maybe?    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 Sulfa Antibiotics Nausea And Vomiting    Family  History  Problem Relation Age of Onset  . Diabetes Mother   . Heart disease Mother     defibrillator; carotid artery stenosis.  . Hypertension Mother   . Hyperlipidemia Mother   . Dementia Mother   . Congestive Heart Failure Mother   . Heart disease Father   . Kidney failure Father     ESRD/peritoneal dialysis  . Diabetes Father   . Hypertension Father   . Hyperlipidemia Father   . Stroke Father   . Kidney disease Father   . Congestive Heart Failure Father   . Diabetes Sister   . Hypertension Sister   . Kidney disease Sister     renal failure  . Diabetes Brother   . Hypertension Brother   . Hyperlipidemia Brother   . Arthritis Brother   . Cervical cancer Sister   . Hypertension Sister   . Liver cancer Sister 10  . Stroke Maternal Grandmother   .  Heart disease Maternal Grandfather   . Stroke Paternal Grandmother   . Heart disease Paternal Grandmother   . Heart disease Paternal Grandfather   . Stroke Paternal Grandfather   . Breast cancer Sister 12  . Hepatitis C Sister 8     Prior to Admission medications   Medication Sig Start Date End Date Taking? Authorizing Provider  aspirin EC 81 MG EC tablet Take 1 tablet (81 mg total) by mouth daily. 12/21/14  Yes Almyra Deforest, PA  atorvastatin (LIPITOR) 80 MG tablet TAKE 1 TABLET (80 MG TOTAL) BY MOUTH DAILY AT 6 PM. 05/01/16  Yes [name], MD  cholecalciferol (VITAMIN D) 1000 UNITS tablet Take 1,000 Units by mouth daily.    Yes Historical Provider, MD  Cyanocobalamin (VITAMIN B-12 PO) Take 1 tablet by mouth daily.   Yes Historical Provider, MD  fluticasone (FLONASE) 50 MCG/ACT nasal spray Place 2 sprays into both nostrils daily as needed. For allergies 05/27/15  Yes Historical Provider, MD  Lancets [facility] ULTRASOFT) lancets Use as instructed -- check sugar once daily One touch ultra 2 05/01/16  Yes [name], MD  losartan-hydrochlorothiazide (HYZAAR) 100-25 MG tablet Take 1 tablet by mouth daily. 05/01/16  Yes [name], MD  montelukast (SINGULAIR) 10 MG tablet TAKE 1 TABLET (10 MG TOTAL) BY MOUTH AT BEDTIME. 05/01/16  Yes [name], MD  nitroGLYCERIN (NITROSTAT) 0.4 MG SL tablet Place 1 tablet (0.4 mg total) under the tongue every 5 (five) minutes x 3 doses as needed for chest pain. 12/21/14  Yes Almyra Deforest, PA  ONE TOUCH ULTRA TEST test strip USE TO TEST BLOOD SUGAR ONCE DAILY. E11.9 08/13/16  Yes Dorian Heckle English, PA  clopidogrel (PLAVIX) 75 MG tablet Take 1 tablet (75 mg total) by mouth daily. Patient not taking: Reported on 05/01/2016 12/22/14   Almyra Deforest, PA    Physical Exam: Vitals:   08/20/16 1600 08/20/16 1630 08/20/16 1645 08/20/16 1700  BP: 112/70 117/64 125/68 168/82  Pulse: (!) 49 (!) 54 (!) 51 (!) 43  Resp: 16 11 12 22   Temp:      TempSrc:      SpO2: 100% 100% 100% 99%      Constitutional: NAD, calm, comfortable-Currently denies dizziness or other symptoms Eyes: PERRL, lids and conjunctivae normal ENMT: Mucous membranes are moist. Posterior pharynx clear of any exudate or lesions.Normal dentition.  Neck: normal, supple, no masses, no thyromegaly Respiratory: clear to auscultation bilaterally, no wheezing, no crackles. Normal respiratory effort. No accessory muscle use.  Cardiovascular: Regular rate and rhythm, no murmurs / rubs / gallops. No extremity edema. 2+ pedal pulses. No carotid bruits.  Abdomen: no tenderness, no masses palpated. No hepatosplenomegaly. Bowel sounds positive.  Musculoskeletal: no clubbing / cyanosis. No joint deformity upper and lower extremities. Good ROM, no contractures. Normal muscle tone.  Skin: no rashes, lesions, ulcers. No induration Neurologic: CN 2-12 grossly intact. Sensation intact, DTR normal. Strength 5/5 x all 4 extremities.  Psychiatric: Normal judgment and insight. Alert and oriented x 3. Normal mood.    Labs on Admission: I have personally reviewed following labs and imaging studies  CBC:  Recent Labs Lab 08/20/16 1127  08/20/16 1155  WBC 9.0  --   NEUTROABS 5.1  --   HGB 12.2 12.6  HCT 37.8 37.0  MCV 93.6  --   PLT 243  --    Basic Metabolic Panel:  Recent Labs Lab 08/20/16 1127 08/20/16 1155  NA 137 139  K  4.4 4.4  CL 102 102  CO2 22  --   GLUCOSE 177* 181*  BUN 26* 35*  CREATININE 1.13* 1.20*  CALCIUM 9.8  --    GFR: CrCl cannot be calculated (Unknown ideal weight.). Liver Function Tests: No results for input(s): AST, ALT, ALKPHOS, BILITOT, PROT, ALBUMIN in the last 168 hours. No results for input(s): LIPASE, AMYLASE in the last 168 hours. No results for input(s): AMMONIA in the last 168 hours. Coagulation Profile:  Recent Labs Lab 08/20/16 1127  INR 0.97   Cardiac Enzymes: No results for input(s): CKTOTAL, CKMB, CKMBINDEX, TROPONINI in the last 168 hours. BNP (last 3 results) No results for input(s): PROBNP in the last 8760 hours. HbA1C: No results for input(s): HGBA1C in the last 72 hours. CBG:  Recent Labs Lab 08/20/16 1505  GLUCAP 136*   Lipid Profile: No results for input(s): CHOL, HDL, LDLCALC, TRIG, CHOLHDL, LDLDIRECT in the last 72 hours. Thyroid Function Tests: No results for input(s): TSH, T4TOTAL, FREET4, T3FREE, THYROIDAB in the last 72 hours. Anemia Panel: No results for input(s): VITAMINB12, FOLATE, FERRITIN, TIBC, IRON, RETICCTPCT in the last 72 hours. Urine analysis:    Component Value Date/Time   COLORURINE YELLOW 08/20/2016 1225   APPEARANCEUR CLEAR 08/20/2016 1225   LABSPEC 1.014 08/20/2016 1225   PHURINE 5.0 08/20/2016 1225   GLUCOSEU NEGATIVE 08/20/2016 1225   HGBUR NEGATIVE 08/20/2016 [place] 08/20/2016 1225   BILIRUBINUR negative 05/01/2016 0939   BILIRUBINUR neg 12/12/2014 1347   KETONESUR NEGATIVE 08/20/2016 1225   PROTEINUR NEGATIVE 08/20/2016 1225   UROBILINOGEN 0.2 05/01/2016 0939   NITRITE NEGATIVE 08/20/2016 1225   LEUKOCYTESUR SMALL (A) 08/20/2016 1225   Sepsis  Labs: @LABRCNTIP (procalcitonin:4,lacticidven:4) )No results found for this or any previous visit (from the past 240 hour(s)).   Radiological Exams on Admission: Ct Angio Head W Or Wo Contrast  Result Date: 08/20/2016 CLINICAL DATA:  Headache and vertigo EXAM: CT ANGIOGRAPHY HEAD AND NECK TECHNIQUE: Multidetector CT imaging of the head and neck was performed using the standard protocol during bolus administration of intravenous contrast. Multiplanar CT image reconstructions and MIPs were obtained to evaluate the vascular anatomy. Carotid stenosis measurements (when applicable) are obtained utilizing NASCET criteria, using the distal internal carotid diameter as the denominator. CONTRAST:  50 mL Isovue 370 IV COMPARISON:  Head CT 08/20/2016 FINDINGS: CT HEAD FINDINGS Brain: No mass lesion, intraparenchymal hemorrhage or extra-axial collection. No evidence of acute cortical infarct. Mild volume loss. There is periventricular hypoattenuation compatible with chronic microvascular disease. Vascular: No hyperdense vessel or unexpected calcification. Skull: Normal visualized skull base, calvarium and extracranial soft tissues. Sinuses/Orbits: No sinus fluid levels or advanced mucosal thickening. No mastoid effusion. Normal orbits. CTA NECK FINDINGS Aortic arch: There is no aneurysm or dissection of the visualized ascending aorta or aortic arch. There is a normal variant aortic arch branching pattern with the brachiocephalic and left common carotid arteries sharing a common origin. The visualized proximal subclavian arteries are normal. Mild aortic arch atherosclerotic calcification. Right carotid system: The right common carotid origin is widely patent. There is no common carotid or internal carotid artery dissection or aneurysm. There is mild atherosclerotic calcification at the carotid bifurcation without hemodynamically significant stenosis. Left carotid system: The left common carotid origin is widely patent.  There is no common carotid or internal carotid artery dissection or aneurysm. No hemodynamically significant stenosis. Vertebral arteries: The vertebral system is left dominant. Both vertebral artery origins are normal. There is progressive narrowing of the  right vertebral artery, which becomes occluded at the C5 level. Minimal opacification of the distal right V4 segment likely secondary to retrograde flow. The left vertebral artery is normal. Skeleton: There is no bony spinal canal stenosis. No lytic or blastic lesions. Other neck: The nasopharynx is clear. The oropharynx and hypopharynx are normal. The epiglottis is normal. The supraglottic larynx, glottis and subglottic larynx are normal. No retropharyngeal collection. The parapharyngeal spaces are preserved. The parotid and submandibular glands are normal. No sialolithiasis or salivary ductal dilatation. There is a heterogeneous left thyroid nodule measuring 2 cm. There is no cervical lymphadenopathy. Upper chest: No pneumothorax or pleural effusion. No nodules or masses. Review of the MIP images confirms the above findings CTA HEAD FINDINGS Anterior circulation: --Intracranial internal carotid arteries: Normal. --Anterior cerebral arteries: Normal. --Middle cerebral arteries: Normal. --Posterior communicating arteries: Absent bilaterally. Posterior circulation: --Posterior cerebral arteries: Normal. --Superior cerebellar arteries: Normal. --Basilar artery: Normal. --Anterior inferior cerebellar arteries: Present on the left. Not clearly seen on the right. --Posterior inferior cerebellar arteries: Normal on the left. Not seen on the right. Venous sinuses: As permitted by contrast timing, patent. Anatomic variants: None Delayed phase: No parenchymal contrast enhancement. Review of the MIP images confirms the above findings IMPRESSION: 1. Tapering of the right vertebral artery, which becomes occluded at the level of C5. The appearance is concerning for  dissection. 2. Non opacification of the right anterior and posterior inferior cerebellar arteries. Given the clinical context, the findings are compatible with posterior fossa ischemia. Further evaluation with MRI may be helpful to evaluate for an acute cerebellar infarct. 3. 2 cm left thyroid nodule. Nonemergent follow-up with dedicated thyroid ultrasound is recommended. 4. Variant arch anatomy with the brachiocephalic and left common carotid arteries sharing a common origin. No right subclavian stenosis. Wide patency of right vertebral artery origin. Critical Value/emergent results were called by telephone at the time of interpretation on 08/20/2016 at 3:08 pm to Dr. Charlesetta Shanks , who verbally acknowledged these results. Electronically Signed   By: Ulyses Jarred M.D.   On: 08/20/2016 15:16   Ct Angio Neck W And/or Wo Contrast  Result Date: 08/20/2016 CLINICAL DATA:  Headache and vertigo EXAM: CT ANGIOGRAPHY HEAD AND NECK TECHNIQUE: Multidetector CT imaging of the head and neck was performed using the standard protocol during bolus administration of intravenous contrast. Multiplanar CT image reconstructions and MIPs were obtained to evaluate the vascular anatomy. Carotid stenosis measurements (when applicable) are obtained utilizing NASCET criteria, using the distal internal carotid diameter as the denominator. CONTRAST:  50 mL Isovue 370 IV COMPARISON:  Head CT 08/20/2016 FINDINGS: CT HEAD FINDINGS Brain: No mass lesion, intraparenchymal hemorrhage or extra-axial collection. No evidence of acute cortical infarct. Mild volume loss. There is periventricular hypoattenuation compatible with chronic microvascular disease. Vascular: No hyperdense vessel or unexpected calcification. Skull: Normal visualized skull base, calvarium and extracranial soft tissues. Sinuses/Orbits: No sinus fluid levels or advanced mucosal thickening. No mastoid effusion. Normal orbits. CTA NECK FINDINGS Aortic arch: There is no aneurysm  or dissection of the visualized ascending aorta or aortic arch. There is a normal variant aortic arch branching pattern with the brachiocephalic and left common carotid arteries sharing a common origin. The visualized proximal subclavian arteries are normal. Mild aortic arch atherosclerotic calcification. Right carotid system: The right common carotid origin is widely patent. There is no common carotid or internal carotid artery dissection or aneurysm. There is mild atherosclerotic calcification at the carotid bifurcation without hemodynamically significant stenosis. Left carotid system: The  left common carotid origin is widely patent. There is no common carotid or internal carotid artery dissection or aneurysm. No hemodynamically significant stenosis. Vertebral arteries: The vertebral system is left dominant. Both vertebral artery origins are normal. There is progressive narrowing of the right vertebral artery, which becomes occluded at the C5 level. Minimal opacification of the distal right V4 segment likely secondary to retrograde flow. The left vertebral artery is normal. Skeleton: There is no bony spinal canal stenosis. No lytic or blastic lesions. Other neck: The nasopharynx is clear. The oropharynx and hypopharynx are normal. The epiglottis is normal. The supraglottic larynx, glottis and subglottic larynx are normal. No retropharyngeal collection. The parapharyngeal spaces are preserved. The parotid and submandibular glands are normal. No sialolithiasis or salivary ductal dilatation. There is a heterogeneous left thyroid nodule measuring 2 cm. There is no cervical lymphadenopathy. Upper chest: No pneumothorax or pleural effusion. No nodules or masses. Review of the MIP images confirms the above findings CTA HEAD FINDINGS Anterior circulation: --Intracranial internal carotid arteries: Normal. --Anterior cerebral arteries: Normal. --Middle cerebral arteries: Normal. --Posterior communicating arteries: Absent  bilaterally. Posterior circulation: --Posterior cerebral arteries: Normal. --Superior cerebellar arteries: Normal. --Basilar artery: Normal. --Anterior inferior cerebellar arteries: Present on the left. Not clearly seen on the right. --Posterior inferior cerebellar arteries: Normal on the left. Not seen on the right. Venous sinuses: As permitted by contrast timing, patent. Anatomic variants: None Delayed phase: No parenchymal contrast enhancement. Review of the MIP images confirms the above findings IMPRESSION: 1. Tapering of the right vertebral artery, which becomes occluded at the level of C5. The appearance is concerning for dissection. 2. Non opacification of the right anterior and posterior inferior cerebellar arteries. Given the clinical context, the findings are compatible with posterior fossa ischemia. Further evaluation with MRI may be helpful to evaluate for an acute cerebellar infarct. 3. 2 cm left thyroid nodule. Nonemergent follow-up with dedicated thyroid ultrasound is recommended. 4. Variant arch anatomy with the brachiocephalic and left common carotid arteries sharing a common origin. No right subclavian stenosis. Wide patency of right vertebral artery origin. Critical Value/emergent results were called by telephone at the time of interpretation on 08/20/2016 at 3:08 pm to Dr. Charlesetta Shanks , who verbally acknowledged these results. Electronically Signed   By: Ulyses Jarred M.D.   On: 08/20/2016 15:16   Ct Head Code Stroke W/o Cm  Result Date: 08/20/2016 CLINICAL DATA:  Code stroke. Severe dizziness with RIGHT-sided headache beginning earlier today. EXAM: CT HEAD WITHOUT CONTRAST TECHNIQUE: Contiguous axial images were obtained from the base of the skull through the vertex without intravenous contrast. COMPARISON:  12/19/2014. FINDINGS: Brain: No evidence for acute infarction, hemorrhage, mass lesion, hydrocephalus, or extra-axial fluid. Mild atrophy. Slight hypoattenuation of white matter,  favored to represent chronic microvascular ischemic change. Vascular: Mild vascular calcification in the distal vertebral arteries and carotid siphon segments. No signs of large vessel occlusion. Skull: Normal. Negative for fracture or focal lesion. Sinuses/Orbits: No acute finding. Other: None. ASPECTS [facility] Stroke Program Early CT Score) - Ganglionic level infarction (caudate, lentiform nuclei, internal capsule, insula, M1-M3 cortex): 7 - Supraganglionic infarction (M4-M6 cortex): 3 Total score (0-10 with 10 being normal): 10 IMPRESSION: 1. Atrophy and small vessel disease. No acute intracranial findings. No findings suggestive of large vessel occlusion. 2. ASPECTS is 10 These results were called by telephone at the time of interpretation on 08/20/2016 at 12:15 pm to Dr. Leonel Ramsay , who verbally acknowledged these results. Electronically Signed   By: Staci Righter  M.D.   On: 08/20/2016 12:16    EKG: (Independently reviewed) sinus bradycardia with ventricular rate 49 bpm, QTC 441 ms, subtly elevated J point but no evidence of acute ischemia  Assessment/Plan Principal Problem:   Vertebral artery dissection/ +/- acute CVA -Patient presents with dizziness plus neck and head pain with CTA demonstrating right vertebral artery dissection and possible associated ischemic changes in the posterior fossa -MRI/MRA brain pending -Discussed with Neuro/Kirkpatrick- give Plavix 300 mg 1 now then aspirin 81 mg with Plavix any 5 mg daily beginning on 2/28 -Ischemic stroke order set initiated -Echocardiogram -Head CTA neck no indication to pursue carotid duplex -Risk factor stratification with hemoglobin A1c/lipid panel -PT/OT/SLP evaluation per protocol -Frequent neurological checks -Systolic blood pressure in the 110 range and given concerns for possible acute ischemic stroke we'll allow for permissive hypertension and hold preadmission losartan-hydrochlorothiazide  Active Problems:   Acute kidney injury   -Baseline renal function: 22/0.91 -Current renal function: 35/1.2 -Was given 1 L of fluids in the ER so we'll follow up labs in a.m. -Hold preadmission hydrochlorothiazide    Left thyroid nodule -Incidental finding on CTA neck -Check TSH, free T4 and T3    Bradycardia -Evaluated by cardiology in the ER; bradycardia determined to be chronic in nature and asymptomatic    Abnormal urinalysis -Asymptomatic -Check urine culture    Diabetes mellitus type II, controlled  -Diet controlled at home -Follow CBGs -SSI -HgbA1c as above    Essential hypertension, benign -Allowing for permissive hypertension (see above)    Coronary artery disease involving native coronary artery of native heart without angina pectoris -Asymptomatic -On statin and aspirin prior to admission -No beta blocker secondary to bradycardia    Hx of laparoscopic gastric banding    Dyslipidemia -Continue statin     DVT prophylaxis: Lovenox Code Status: Full Family Communication: Husband and brother at bedside Disposition Plan: Return to previous abdominal environment Consults called: Neurology/[facility]; Cardiology/[facility] on-call    [name] ANP-BC [facility] Pager [phone]   If 7PM-7AM, please contact night-coverage [email or web address] Password [facility]  08/20/2016, 5:28 PM

## 2016-08-20 NOTE — Progress Notes (Signed)
Return from MRI

## 2016-08-20 NOTE — ED Notes (Signed)
Pt reports feeling like her mouth is numb. Pt states that her speech is slurred and like something is in her throat. Pt is clearing her throat multiple times. MD made aware and pt taken to CT angio studies per MD request.

## 2016-08-20 NOTE — Telephone Encounter (Signed)
Left message for pt to c/b to discuss.  Requested she keep a diary of BP to bring with her to her up coming appt.

## 2016-08-20 NOTE — ED Notes (Signed)
Attempted report 

## 2016-08-20 NOTE — ED Notes (Signed)
ED Provider at bedside. 

## 2016-08-20 NOTE — Telephone Encounter (Signed)
Pt daughter called stating her mom is on way to hospital by ems with possible heart attach and would like a call from dr Tamala Julian today   Best [phone]

## 2016-08-20 NOTE — ED Notes (Signed)
MD made aware of pts hr in the 40's. Will hold IV push meds at this time.

## 2016-08-20 NOTE — ED Notes (Signed)
Pt reports generalized weakness. Pt noted to have HR drop to 38. Pt remained alert and states she "does not feel well". Dr. Johnney Killian made aware. Will hold on MRI at this time and due CT angios instead per dr pfeiffer's request.

## 2016-08-20 NOTE — ED Notes (Signed)
Pt denies pain, headache, dizziness and nausea at this time. Pt reports that her speech is still slurred however numbness in her lips has resolved.

## 2016-08-20 NOTE — Consult Note (Signed)
CARDIOLOGY CONSULT NOTE   Patient ID: Elaine Jackson MRN: [id number] DOB/AGE: 1955-07-31 61 y.o.  Admit date: 08/20/2016  Requesting Physician: Dr. Marily Memos  Primary Physician:   Reginia Forts, MD Primary Cardiologist:  Dr. Marlou Porch Reason for Consultation:  bradycardia  HPI: Elaine Jackson is a 61 y.o. female with a history of DMT2, HTN, HLD, obesity s/p lap banding, CAD s/p apical MI (11/2014), sinus bradycardia who presented to [facility] on 08/20/16 for evaluation of dizziness, slurred speech and HA. Cardiology consulted due to bradycardia.   She was admitted in 11/2014 for headache, jaw pain. The symptom were associated with diaphoresis. Patient initially did not try to seek medical attention. However she eventually went to urgent care around 4:30 PM that evening and was found to have abnormal EKG with subtle ST elevation in the continued improving symptom. She was referred to [facility]. On arrival, her symptom has I  largely improved with 4-5 out of 10 jaw discomfort and continued posterior headache. EKG showed minimal ST elevation in inferior lead and some ST depression in the anterior lead without reciprocal changes. She was placed on IV heparin. Overnight, her serial troponin went up to 30, before trending back down. We were not unable to add metoprolol given baseline bradycardia. She underwent a scheduled cardiac catheterization on 12/20/2014 and noted small to moderate apical infarction due to distal LAD stenosis with reestablished TIMI grade 2 flow. Medical therapy was recommended. She was loaded with Plavix prior to discharge.   She was finishing up a hair cut today she has sudden onset of dizziness and nausea with vomiting. She felt like she couldn't open her eye due to the dizziness. She also had a HA and slurred speech. Of note she did not feel well last Sunday and laid around all day, which is unlike her. She also woke from sleep with some jaw pain similar to her previous MI sx. She  took a SL NTG and felt better. She did call our office and got an appointment with Dr. Marlou Porch in a couple weeks.   In the ER, CT head showed no acute intracranial abnormalities but did show a vertebral dissection with non opacification of the right anterior and posterior inferior cerebellar arteries. Given the clinical context, the findings are compatible with posterior fossa ischemia. Plan is to treat with ASA and plavix.   She is currently feeling better. Slurred speech has resolved. Has a significant HA. Just feels crummy. No CP or SOB. No LE edema, orthopnea or PND. No syncope. No blood in stool or urine. No palpitations.     Past Medical History:  Diagnosis Date  . Allergic rhinitis, cause unspecified   . Allergy   . CAD (coronary artery disease)    LHC 12/20/2014 showed small to moderate apical infarction due to distal LAD stenosis with reestablished TIMI2 flow. No PCI. Recommended plavix for 1 yr and ASA for life.   . Chicken pox   . Diabetes mellitus   . Family history of breast cancer in first degree relative    sister age 37  . Heart murmur   . Hyperlipidemia   . Hypertension   . Malignant neoplasm skin of face 02/16/2007   Basal Cell Carcinoma  [place].  . Measles   . Obesity, unspecified   . Other chronic nonalcoholic liver disease   . Sleep related leg cramps   . Unspecified vitamin D deficiency      Past Surgical History:  Procedure Laterality  Date  . CARDIAC CATHETERIZATION N/A 12/20/2014   Procedure: Left Heart Cath and Coronary Angiography;  Surgeon: Sanda Klein, MD;  Location: [facility];  Service: Cardiovascular;  Laterality: N/A;  . CHOLECYSTECTOMY    . COLONOSCOPY  06/25/2007   [place]. Normal. Repeat in ten years.  Marland Kitchen LAPAROSCOPIC GASTRIC BANDING  07/03/10   Dr. Sherrin Daisy; [facility]  . TONSILLECTOMY      Allergies  Allergen Reactions  . Sulfa Antibiotics Nausea And Vomiting    I have reviewed the patient's current medications .  morphine  injection  4 mg Intravenous Once      Prior to Admission medications   Medication Sig Start Date End Date Taking? Authorizing Provider  aspirin EC 81 MG EC tablet Take 1 tablet (81 mg total) by mouth daily. 12/21/14   Almyra Deforest, PA  atorvastatin (LIPITOR) 80 MG tablet TAKE 1 TABLET (80 MG TOTAL) BY MOUTH DAILY AT 6 PM. 05/01/16   [name], MD  cholecalciferol (VITAMIN D) 1000 UNITS tablet Take 2,000 Units by mouth daily.    Historical Provider, MD  clopidogrel (PLAVIX) 75 MG tablet Take 1 tablet (75 mg total) by mouth daily. Patient not taking: Reported on 05/01/2016 12/22/14   Almyra Deforest, PA  fluticasone [facility]) 50 MCG/ACT nasal spray Place 2 sprays into both nostrils daily as needed. For allergies 05/27/15   Historical Provider, MD  Lancets [facility] ULTRASOFT) lancets Use as instructed -- check sugar once daily One touch ultra 2 05/01/16   [name], MD  losartan-hydrochlorothiazide (HYZAAR) 100-25 MG tablet Take 1 tablet by mouth daily. 05/01/16   [name], MD  montelukast (SINGULAIR) 10 MG tablet TAKE 1 TABLET (10 MG TOTAL) BY MOUTH AT BEDTIME. 05/01/16   [name], MD  nitroGLYCERIN (NITROSTAT) 0.4 MG SL tablet Place 1 tablet (0.4 mg total) under the tongue every 5 (five) minutes x 3 doses as needed for chest pain. 12/21/14   Almyra Deforest, PA  ONE TOUCH ULTRA TEST test strip USE TO TEST BLOOD SUGAR ONCE DAILY. E11.9 08/13/16   Joretta Bachelor, PA     Social History   Social History  . Marital status: Married    Spouse name: N/A  . Number of children: 1  . Years of education: N/A   Occupational History  . 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 Smoking status: Former Smoker    Packs/day: 1.00    Years: 25.00    Types: Cigarettes  . Smokeless tobacco: Never Used     Comment: quit age 1     smoked from 24 to 69 off and on  . Alcohol use No  . Drug use: No  . Sexual activity: Yes    Birth control/ protection: Post-menopausal   Other Topics  Concern  . Not on file   Social History Narrative   Marital status:  Married; x 26 years. Second marriage. Happily married; no abuse      Children: one child, one stepson;two grandsons      Lives: with husband, mother in 2017      Employment:  [employer] part-time 25-30 hours per week; current job x 19 years.      Tobacco:  None      Alcohol: rare/minimal.      Drugs:  None       Exercise: none in 2017       Seatbelt:  100%.       Sunscreen:  SPF 15.      Guns:  Maybe?    Family Status  Relation Status  . Mother Alive   62  . Father Deceased at age 42   CHF  . Sister Alive   age 31  . Brother Alive   49  . Sister Alive   70  . Sister Deceased at age 24   Liver cancer.  . Maternal Grandmother Deceased  . Maternal Grandfather Deceased  . Paternal Grandmother Deceased  . Paternal Grandfather Deceased  . Sister   . Sister    Family History  Problem Relation Age of Onset  . Diabetes Mother   . Heart disease Mother     defibrillator; carotid artery stenosis.  . Hypertension Mother   . Hyperlipidemia Mother   . Dementia Mother   . Congestive Heart Failure Mother   . Heart disease Father   . Kidney failure Father     ESRD/peritoneal dialysis  . Diabetes Father   . Hypertension Father   . Hyperlipidemia Father   . Stroke Father   . Kidney disease Father   . Congestive Heart Failure Father   . Diabetes Sister   . Hypertension Sister   . Kidney disease Sister     renal failure  . Diabetes Brother   . Hypertension Brother   . Hyperlipidemia Brother   . Arthritis Brother   . Cervical cancer Sister   . Hypertension Sister   . Liver cancer Sister 77  . Stroke Maternal Grandmother   . Heart disease Maternal Grandfather   . Stroke Paternal Grandmother   . Heart disease Paternal Grandmother   . Heart disease Paternal Grandfather   . Stroke Paternal Grandfather   . Breast cancer Sister 65  . Hepatitis C Sister 63       ROS:  Full 14 point review of systems  complete and found to be negative unless listed above.  Physical Exam: Blood pressure 112/71, pulse (!) 51, temperature 97.6 F (36.4 C), temperature source Oral, resp. rate 13, SpO2 100 %.  General: Well developed, well nourished, female in no acute distress Head: Eyes PERRLA, No xanthomas.   Normocephalic and atraumatic, oropharynx without edema or exudate.   Lungs: CTAB Heart: HRRR S1 S2, no rub/gallop, Heart regular rhythm, brady with S1, S2 no murmur. pulses are 2+ extrem.   Neck: No carotid bruits. No lymphadenopathy.  No JVD. Abdomen: Bowel sounds present, abdomen soft and non-tender without masses or hernias noted. Msk:  No spine or cva tenderness. No weakness, no joint deformities or effusions. Extremities: No clubbing or cyanosis. No  edema.  Neuro: Alert and oriented X 3. No focal deficits noted. Psych:  Good affect, responds appropriately Skin: No rashes or lesions noted.  Labs:   Lab Results  Component Value Date   WBC 9.0 08/20/2016   HGB 12.6 08/20/2016   HCT 37.0 08/20/2016   MCV 93.6 08/20/2016   PLT 243 08/20/2016    Recent Labs  08/20/16 1127  INR 0.97     Recent Labs Lab 08/20/16 1127 08/20/16 1155  NA 137 139  K 4.4 4.4  CL 102 102  CO2 22  --   BUN 26* 35*  CREATININE 1.13* 1.20*  CALCIUM 9.8  --   GLUCOSE 177* 181*   No results found for: MG No results for input(s): CKTOTAL, CKMB, TROPONINI in the last 72 hours.  Recent Labs  08/20/16 1139  TROPIPOC 0.01   No results found for: PROBNP Lab Results  Component Value Date   CHOL 120 05/01/2016   HDL 45 (L) 05/01/2016   LDLCALC 58 05/01/2016   TRIG 87 05/01/2016   No results found for: DDIMER Lipase  Date/Time Value Ref Range Status  04/28/2013 01:51 PM 14 0 - 75 U/L Final   TSH  Date/Time Value Ref Range Status  05/01/2016 09:31 AM 0.84 mIU/L Final    Comment:      Reference Range   > or = 20 Years  0.40-4.50   Pregnancy Range First trimester  0.26-2.66 Second trimester  0.55-2.73 Third trimester  0.43-2.91      Vitamin B-12  Date/Time Value Ref Range Status  09/14/2012 09:00 AM 299 211 - 911 pg/mL Final   Folate  Date/Time Value Ref Range Status  09/14/2012 09:00 AM 15.7 ng/mL Final    Comment:      Reference Ranges         Deficient:       0.4 - 3.3 ng/mL         Indeterminate:   3.4 - 5.4 ng/mL         Normal:              > 5.4 ng/mL     Iron  Date/Time Value Ref Range Status  04/28/2013 01:56 PM 85 42 - 145 ug/dL Final   12/20/2014 Cardiac catheterization Conclusion     Dist LAD lesion, 90% stenosed.  Apical infarction with preserved overall LVEF  IMPRESSIONS:  Single vessel CAD. Small to moderate apical infarction due to a distal LAD stenosis with reestablished (TIMI2) flow   RECOMMENDATION:  Medical therapy with statin, lifelong ASA, dual antiplatelet therapy for 12 months.  Prefer ARB/ACEi for BP control.  Beta blockers would be helpful, but precluded by baseline bradycardia.  Aggressive control of risk factors, especially DM.          Echo 12/21/14  Conclusion - Left ventricle: The cavity size was normal. There was mild focalbasal hypertrophy of the septum. Systolic function was normal.The estimated ejection fraction was in the range of 60% to 65%.Wall motion was normal; there were no regional wall motionabnormalities.  (grade 1 diastolic dysfunction).Doppler parameters are consistent with elevated ventricularend-diastolic filling pressure. - Aortic valve: Trileaflet; normal thickness leaflets. There was mild regurgitation. - Aortic root: The aortic root was normal in size. - Left atrium: The atrium was mildly dilated. - Right ventricle: The cavity size was normal. Wall thickness wasnormal. Systolic function was normal. - Right atrium: The atrium was normal in size. - Tricuspid valve: There was mild regurgitation. - Pulmonary arteries: Systolic pressure was mildly increased. PApeak pressure: 44 mm  Hg (S). Obese - Inferior vena cava: The vessel was dilated. The respirophasicdiameter changes were blunted (< 50%), consistent with elevatedcentral venous pressure. - Pericardium, extracardiac: A trivial pericardial effusion was identified.   ECG:  Sinus brady HR 49  Radiology:  Ct Angio Head W Or Wo Contrast  Result Date: 08/20/2016 CLINICAL DATA:  Headache and vertigo EXAM: CT ANGIOGRAPHY HEAD AND NECK TECHNIQUE: Multidetector CT imaging of the head and neck was performed using the standard protocol during bolus administration of intravenous contrast. Multiplanar CT image reconstructions and MIPs were obtained to evaluate the vascular anatomy. Carotid stenosis measurements (when applicable) are obtained utilizing NASCET criteria, using the distal internal carotid diameter as the denominator. CONTRAST:  50 mL Isovue 370 IV COMPARISON:  Head CT 08/20/2016 FINDINGS: CT HEAD FINDINGS Brain: No mass lesion, intraparenchymal hemorrhage or extra-axial collection. No evidence of  acute cortical infarct. Mild volume loss. There is periventricular hypoattenuation compatible with chronic microvascular disease. Vascular: No hyperdense vessel or unexpected calcification. Skull: Normal visualized skull base, calvarium and extracranial soft tissues. Sinuses/Orbits: No sinus fluid levels or advanced mucosal thickening. No mastoid effusion. Normal orbits. CTA NECK FINDINGS Aortic arch: There is no aneurysm or dissection of the visualized ascending aorta or aortic arch. There is a normal variant aortic arch branching pattern with the brachiocephalic and left common carotid arteries sharing a common origin. The visualized proximal subclavian arteries are normal. Mild aortic arch atherosclerotic calcification. Right carotid system: The right common carotid origin is widely patent. There is no common carotid or internal carotid artery dissection or aneurysm. There is mild atherosclerotic calcification at the carotid  bifurcation without hemodynamically significant stenosis. Left carotid system: The left common carotid origin is widely patent. There is no common carotid or internal carotid artery dissection or aneurysm. No hemodynamically significant stenosis. Vertebral arteries: The vertebral system is left dominant. Both vertebral artery origins are normal. There is progressive narrowing of the right vertebral artery, which becomes occluded at the C5 level. Minimal opacification of the distal right V4 segment likely secondary to retrograde flow. The left vertebral artery is normal. Skeleton: There is no bony spinal canal stenosis. No lytic or blastic lesions. Other neck: The nasopharynx is clear. The oropharynx and hypopharynx are normal. The epiglottis is normal. The supraglottic larynx, glottis and subglottic larynx are normal. No retropharyngeal collection. The parapharyngeal spaces are preserved. The parotid and submandibular glands are normal. No sialolithiasis or salivary ductal dilatation. There is a heterogeneous left thyroid nodule measuring 2 cm. There is no cervical lymphadenopathy. Upper chest: No pneumothorax or pleural effusion. No nodules or masses. Review of the MIP images confirms the above findings CTA HEAD FINDINGS Anterior circulation: --Intracranial internal carotid arteries: Normal. --Anterior cerebral arteries: Normal. --Middle cerebral arteries: Normal. --Posterior communicating arteries: Absent bilaterally. Posterior circulation: --Posterior cerebral arteries: Normal. --Superior cerebellar arteries: Normal. --Basilar artery: Normal. --Anterior inferior cerebellar arteries: Present on the left. Not clearly seen on the right. --Posterior inferior cerebellar arteries: Normal on the left. Not seen on the right. Venous sinuses: As permitted by contrast timing, patent. Anatomic variants: None Delayed phase: No parenchymal contrast enhancement. Review of the MIP images confirms the above findings IMPRESSION:  1. Tapering of the right vertebral artery, which becomes occluded at the level of C5. The appearance is concerning for dissection. 2. Non opacification of the right anterior and posterior inferior cerebellar arteries. Given the clinical context, the findings are compatible with posterior fossa ischemia. Further evaluation with MRI may be helpful to evaluate for an acute cerebellar infarct. 3. 2 cm left thyroid nodule. Nonemergent follow-up with dedicated thyroid ultrasound is recommended. 4. Variant arch anatomy with the brachiocephalic and left common carotid arteries sharing a common origin. No right subclavian stenosis. Wide patency of right vertebral artery origin. Critical Value/emergent results were called by telephone at the time of interpretation on 08/20/2016 at 3:08 pm to Dr. Charlesetta Shanks , who verbally acknowledged these results. Electronically Signed   By: Ulyses Jarred M.D.   On: 08/20/2016 15:16   Ct Angio Neck W And/or Wo Contrast  Result Date: 08/20/2016 CLINICAL DATA:  Headache and vertigo EXAM: CT ANGIOGRAPHY HEAD AND NECK TECHNIQUE: Multidetector CT imaging of the head and neck was performed using the standard protocol during bolus administration of intravenous contrast. Multiplanar CT image reconstructions and MIPs were obtained to evaluate the vascular anatomy. Carotid stenosis measurements (when  applicable) are obtained utilizing NASCET criteria, using the distal internal carotid diameter as the denominator. CONTRAST:  50 mL Isovue 370 IV COMPARISON:  Head CT 08/20/2016 FINDINGS: CT HEAD FINDINGS Brain: No mass lesion, intraparenchymal hemorrhage or extra-axial collection. No evidence of acute cortical infarct. Mild volume loss. There is periventricular hypoattenuation compatible with chronic microvascular disease. Vascular: No hyperdense vessel or unexpected calcification. Skull: Normal visualized skull base, calvarium and extracranial soft tissues. Sinuses/Orbits: No sinus fluid levels  or advanced mucosal thickening. No mastoid effusion. Normal orbits. CTA NECK FINDINGS Aortic arch: There is no aneurysm or dissection of the visualized ascending aorta or aortic arch. There is a normal variant aortic arch branching pattern with the brachiocephalic and left common carotid arteries sharing a common origin. The visualized proximal subclavian arteries are normal. Mild aortic arch atherosclerotic calcification. Right carotid system: The right common carotid origin is widely patent. There is no common carotid or internal carotid artery dissection or aneurysm. There is mild atherosclerotic calcification at the carotid bifurcation without hemodynamically significant stenosis. Left carotid system: The left common carotid origin is widely patent. There is no common carotid or internal carotid artery dissection or aneurysm. No hemodynamically significant stenosis. Vertebral arteries: The vertebral system is left dominant. Both vertebral artery origins are normal. There is progressive narrowing of the right vertebral artery, which becomes occluded at the C5 level. Minimal opacification of the distal right V4 segment likely secondary to retrograde flow. The left vertebral artery is normal. Skeleton: There is no bony spinal canal stenosis. No lytic or blastic lesions. Other neck: The nasopharynx is clear. The oropharynx and hypopharynx are normal. The epiglottis is normal. The supraglottic larynx, glottis and subglottic larynx are normal. No retropharyngeal collection. The parapharyngeal spaces are preserved. The parotid and submandibular glands are normal. No sialolithiasis or salivary ductal dilatation. There is a heterogeneous left thyroid nodule measuring 2 cm. There is no cervical lymphadenopathy. Upper chest: No pneumothorax or pleural effusion. No nodules or masses. Review of the MIP images confirms the above findings CTA HEAD FINDINGS Anterior circulation: --Intracranial internal carotid arteries: Normal.  --Anterior cerebral arteries: Normal. --Middle cerebral arteries: Normal. --Posterior communicating arteries: Absent bilaterally. Posterior circulation: --Posterior cerebral arteries: Normal. --Superior cerebellar arteries: Normal. --Basilar artery: Normal. --Anterior inferior cerebellar arteries: Present on the left. Not clearly seen on the right. --Posterior inferior cerebellar arteries: Normal on the left. Not seen on the right. Venous sinuses: As permitted by contrast timing, patent. Anatomic variants: None Delayed phase: No parenchymal contrast enhancement. Review of the MIP images confirms the above findings IMPRESSION: 1. Tapering of the right vertebral artery, which becomes occluded at the level of C5. The appearance is concerning for dissection. 2. Non opacification of the right anterior and posterior inferior cerebellar arteries. Given the clinical context, the findings are compatible with posterior fossa ischemia. Further evaluation with MRI may be helpful to evaluate for an acute cerebellar infarct. 3. 2 cm left thyroid nodule. Nonemergent follow-up with dedicated thyroid ultrasound is recommended. 4. Variant arch anatomy with the brachiocephalic and left common carotid arteries sharing a common origin. No right subclavian stenosis. Wide patency of right vertebral artery origin. Critical Value/emergent results were called by telephone at the time of interpretation on 08/20/2016 at 3:08 pm to Dr. Charlesetta Shanks , who verbally acknowledged these results. Electronically Signed   By: Ulyses Jarred M.D.   On: 08/20/2016 15:16   Ct Head Code Stroke W/o Cm  Result Date: 08/20/2016 CLINICAL DATA:  Code stroke. Severe dizziness with  RIGHT-sided headache beginning earlier today. EXAM: CT HEAD WITHOUT CONTRAST TECHNIQUE: Contiguous axial images were obtained from the base of the skull through the vertex without intravenous contrast. COMPARISON:  12/19/2014. FINDINGS: Brain: No evidence for acute infarction,  hemorrhage, mass lesion, hydrocephalus, or extra-axial fluid. Mild atrophy. Slight hypoattenuation of white matter, favored to represent chronic microvascular ischemic change. Vascular: Mild vascular calcification in the distal vertebral arteries and carotid siphon segments. No signs of large vessel occlusion. Skull: Normal. Negative for fracture or focal lesion. Sinuses/Orbits: No acute finding. Other: None. ASPECTS [facility] Stroke Program Early CT Score) - Ganglionic level infarction (caudate, lentiform nuclei, internal capsule, insula, M1-M3 cortex): 7 - Supraganglionic infarction (M4-M6 cortex): 3 Total score (0-10 with 10 being normal): 10 IMPRESSION: 1. Atrophy and small vessel disease. No acute intracranial findings. No findings suggestive of large vessel occlusion. 2. ASPECTS is 10 These results were called by telephone at the time of interpretation on 08/20/2016 at 12:15 pm to Dr. Leonel Ramsay , who verbally acknowledged these results. Electronically Signed   By: Staci Righter M.D.   On: 08/20/2016 12:16    ASSESSMENT AND PLAN:    Principal Problem:   Dizziness Active Problems:   Hx of laparoscopic gastric banding   Diabetes mellitus type II, controlled ([place])   Essential hypertension, benign   Dyslipidemia   Class 2 obesity due to excess calories with serious comorbidity and body mass index (BMI) of 36.0 to 36.9 in adult   Pure hypercholesterolemia   Ischemic heart disease   Slurred speech  Elaine Jackson is a 61 y.o. female with a history of DMT2, HTN, HLD, obesity s/p lap banding, CAD s/p apical MI (11/2014), sinus bradycardia who presented to [facility] on 08/20/16 for evaluation of dizziness, slurred speech and HA. Cardiology consulted due to bradycardia.   Sinus bradycardia: she has a long history of this. Her presentation today has nothing to do with her heart rate. She is on no AVN blocking agents. I reviewed tele and her ECGs and the lowest HR recorded was ~ high 40s. Per review of RN reports,  HRs went into high 30s. Discussed with neuro PA and worsening of her chronic bradycardia may be due to compression of her vagus nerve 2/2 vertebral dissection.  No indication for PPM at this time.   Dizziness/slurred speech: CT head showed no acute intracranial abnormalities but did show a vertebral dissection with non opacification of the right anterior and posterior inferior cerebellar arteries. Given the clinical context, the findings are compatible with posterior fossa ischemia. Plan is to treat with ASA and plavix.   Vertebral dissection: this could explain her sx. Plan is for ASA/ plavix  HTN: BP well controlled currently.   CAD: she did have an episode on Sunday where she had to take SL NTG. She has not had recurrence of these symptoms since that time. Troponin neg x1. ECG with no acute ST or TW changes. She has an appointment arranged for Dr. Marlou Porch on 09/02/16. I don't think there is a need for urgent ischemic work up at this time. Continue ASA, statin. No BB given bradycardia issues.   HLD: continue statin    Signed: Angelena Form, PA-C 08/20/2016 4:07 PM  Pager [phone]  Co-Sign MD  Patient seen and examined. Agree with assessment and plan.  Elaine Jackson is a 61 year old female was a history of obesity, status post lap banding, hypertension, type 2 diabetes mellitus, hyperlipidemia, and CAD.  An apical MI in June 2016 and was  found to have very distal LAD stenosis for which she was treated medically.  She had been on aspirin and Plavix but stopped Plavix proximally, one year ago. Is remote history of migraine headaches.  Today she developed sudden onset of  dizziness, nausea and complained of right neck and discomfort in the right temporal region.  She also was photophobic.  Imaging has revealed a probable right vertebral dissection at the C5 level and there was nonopacification of the right anterior and posterior inferior cerebellar arteries and is felt to have findings  compatible with posterior fossa ischemia.  Aspirin and Plavix was recommended.  She has been noted to be bradycardic today with heart rates in the 40s and rarely upper 30s.  She is in sinus rhythm without block.  An ECG earlier today has shown sinus rhythm at 56 .  A subsequent ECG has shown sinus bradycardia at 49 bpm.  There are no significant ST-T changes.  In the past she has been noted to have heart rates in the 50s at office visits.  It is possible that her exaggeration of bradycardia may be contributed by vagal nerve compression.  She is not on any rate control medications. She has been on high potency statin therapy for hyperlipidemia with target LDL less than 70.  She also has been on losartan HCT for hypertension.  She will be going for MRI/MRA later today.  We will schedule the patient for echo Doppler study tomorrow to reassess LV systolic and diastolic function as well as valvular architecture.  Continue to monitor on telemetry.  At present there is no indication for pacemaker.  We will follow.                          Elaine Sine, MD, [facility] 08/20/2016 7:05 PM

## 2016-08-20 NOTE — ED Notes (Signed)
Pt reports similar episode Sunday with high bp and some dizziness. Pt denies N/V with that episode.

## 2016-08-20 NOTE — Progress Notes (Signed)
Patient arrived to 5M04 at this time from ED. Safety precautions and orders reviewed. TELE applied. MD paged per arrival. Family at bedside. VSS. Will continue to monitor.  Ave Filter, RN

## 2016-08-20 NOTE — ED Notes (Signed)
MRI to get pt

## 2016-08-20 NOTE — Telephone Encounter (Signed)
Please call pt and let her know that Dr. Tamala Julian is out of the office today and see if there is anything we can help with.

## 2016-08-20 NOTE — ED Notes (Signed)
Spoke with Dr. Saralyn Pilar and team to discuss pt's level of care, states OK for telemetry room assignment

## 2016-08-20 NOTE — ED Provider Notes (Signed)
[facility] DEPT Provider Note   CSN: [id number] Arrival date & time: 08/20/16  1119   An emergency department physician performed an initial assessment on this suspected stroke patient at 1141.  History   Chief Complaint Chief Complaint  Patient presents with  . Dizziness    HPI Elaine Jackson is a 61 y.o. female.  HPI Patient had been at work. She is a Theme park manager, and 10:30 she suddenly became very dizzy and nauseated. She reports the dizziness made her feel like it was pulling her towards the right side and she was kind of heavy on the right. She denies there was a absolute difficulty in moving her extremities but just felt like everything was "favoring to the right". She was equivocal about headache. Upon my initial evaluation patient did not endorse significant headache. Later during the assessment however headache became much more pronounced symptom. This is right sided. She denied visual loss, change or blurred vision. She reports she vomited about 3 times. Family members report she has a distant history of migraines but has not had problems with migraines for a while. Patient's husband however reports that 2 days ago, on Sunday she really didn't seem to feel very well and had complained of a headache. He reports her blood pressure was elevated at day and she spent most the day resting and lying about which is atypical. Past Medical History:  Diagnosis Date  . Allergic rhinitis, cause unspecified   . Allergy   . CAD (coronary artery disease)    LHC 12/20/2014 showed small to moderate apical infarction due to distal LAD stenosis with reestablished TIMI2 flow. No PCI. Recommended plavix for 1 yr and ASA for life.   . Chicken pox   . Diabetes mellitus   . Family history of breast cancer in first degree relative    sister age 13  . Heart murmur   . Hyperlipidemia   . Hypertension   . Malignant neoplasm skin of face 02/16/2007   Basal Cell Carcinoma  [place].  . Measles   .  Obesity, unspecified   . Other chronic nonalcoholic liver disease   . Sleep related leg cramps   . Unspecified vitamin D deficiency     Patient Active Problem List   Diagnosis Date Noted  . Vertebral artery dissection ([place]) 08/20/2016  . Acute kidney injury ([place]) 08/20/2016  . Left thyroid nodule 08/20/2016  . Bradycardia 08/20/2016  . Chronic seasonal allergic rhinitis due to pollen 05/20/2016  . Secondary pulmonary hypertension 08/28/2015  . Old MI (myocardial infarction) 08/28/2015  . Ischemic heart disease 08/28/2015  . Coronary artery disease involving native coronary artery of native heart without angina pectoris 05/15/2015  . Migraine 12/21/2014  . Vitamin D deficiency 01/20/2013  . Nail dystrophy 09/14/2012  . Anemia, unspecified 06/08/2012  . Diabetes mellitus type II, controlled ([place]) 04/16/2012  . Essential hypertension, benign 04/16/2012  . Dyslipidemia 04/16/2012  . Class 2 obesity due to excess calories with serious comorbidity and body mass index (BMI) of 36.0 to 36.9 in adult 04/16/2012  . Hx of laparoscopic gastric banding 02/13/2011    Past Surgical History:  Procedure Laterality Date  . CARDIAC CATHETERIZATION N/A 12/20/2014   Procedure: Left Heart Cath and Coronary Angiography;  Surgeon: Sanda Klein, MD;  Location: [facility];  Service: Cardiovascular;  Laterality: N/A;  . CHOLECYSTECTOMY    . COLONOSCOPY  06/25/2007   [place]. Normal. Repeat in ten years.  Marland Kitchen LAPAROSCOPIC GASTRIC BANDING  07/03/10   Dr.  Hocksworth; [facility]  . TONSILLECTOMY      OB History    No data available       Home Medications    Prior to Admission medications   Medication Sig Start Date End Date Taking? Authorizing Provider  aspirin EC 81 MG EC tablet Take 1 tablet (81 mg total) by mouth daily. 12/21/14   Almyra Deforest, PA  atorvastatin (LIPITOR) 80 MG tablet TAKE 1 TABLET (80 MG TOTAL) BY MOUTH DAILY AT 6 PM. 05/01/16   [name], MD  cholecalciferol (VITAMIN D)  1000 UNITS tablet Take 2,000 Units by mouth daily.    Historical Provider, MD  clopidogrel (PLAVIX) 75 MG tablet Take 1 tablet (75 mg total) by mouth daily. Patient not taking: Reported on 05/01/2016 12/22/14   Almyra Deforest, PA  fluticasone [facility]) 50 MCG/ACT nasal spray Place 2 sprays into both nostrils daily as needed. For allergies 05/27/15   Historical Provider, MD  Lancets [facility] ULTRASOFT) lancets Use as instructed -- check sugar once daily One touch ultra 2 05/01/16   [name], MD  losartan-hydrochlorothiazide (HYZAAR) 100-25 MG tablet Take 1 tablet by mouth daily. 05/01/16   [name], MD  montelukast (SINGULAIR) 10 MG tablet TAKE 1 TABLET (10 MG TOTAL) BY MOUTH AT BEDTIME. 05/01/16   [name], MD  nitroGLYCERIN (NITROSTAT) 0.4 MG SL tablet Place 1 tablet (0.4 mg total) under the tongue every 5 (five) minutes x 3 doses as needed for chest pain. 12/21/14   Almyra Deforest, PA  ONE TOUCH ULTRA TEST test strip USE TO TEST BLOOD SUGAR ONCE DAILY. E11.9 08/13/16   Joretta Bachelor, PA    Family History Family History  Problem Relation Age of Onset  . Diabetes Mother   . Heart disease Mother     defibrillator; carotid artery stenosis.  . Hypertension Mother   . Hyperlipidemia Mother   . Dementia Mother   . Congestive Heart Failure Mother   . Heart disease Father   . Kidney failure Father     ESRD/peritoneal dialysis  . Diabetes Father   . Hypertension Father   . Hyperlipidemia Father   . Stroke Father   . Kidney disease Father   . Congestive Heart Failure Father   . Diabetes Sister   . Hypertension Sister   . Kidney disease Sister     renal failure  . Diabetes Brother   . Hypertension Brother   . Hyperlipidemia Brother   . Arthritis Brother   . Cervical cancer Sister   . Hypertension Sister   . Liver cancer Sister 33  . Stroke Maternal Grandmother   . Heart disease Maternal Grandfather   . Stroke Paternal Grandmother   . Heart disease Paternal Grandmother   .  Heart disease Paternal Grandfather   . Stroke Paternal Grandfather   . Breast cancer Sister 68  . Hepatitis C Sister 39    Social History Social History  Substance Use Topics  . Smoking status: Former Smoker    Packs/day: 1.00    Years: 25.00    Types: Cigarettes  . Smokeless tobacco: Never Used     Comment: quit age 24     smoked from 63 to 53 off and on  . Alcohol use No     Allergies   Sulfa antibiotics   Review of Systems Review of Systems 10 Systems reviewed and are negative for acute change except as noted in the HPI.   Physical Exam Updated Vital Signs  BP 112/71   Pulse (!) 51   Temp 97.6 F (36.4 C) (Oral)   Resp 13   SpO2 100%   Physical Exam  Constitutional: She appears well-developed and well-nourished.  Patient appears very uncomfortable. She is keeping her eyes closed. Slightly pale.  HENT:  Head: Normocephalic and atraumatic.  Right Ear: External ear normal.  Left Ear: External ear normal.  Nose: Nose normal.  Mouth/Throat: Oropharynx is clear and moist.  Eyes: EOM are normal. Pupils are equal, round, and reactive to light.  Neck: Neck supple.  Cardiovascular: Regular rhythm.   Bradycardia.  Pulmonary/Chest: Effort normal and breath sounds normal.  Abdominal: Soft. She exhibits no distension. There is no tenderness. There is no guarding.  Musculoskeletal: Normal range of motion. She exhibits no edema, tenderness or deformity.  Neurological:  Patient seems fatigued and slightly somnolent but is oriented 3. She does follow commands for upper and lower motor strength testing which is 5\5. She is equivocal of performing finger-nose examination. Past pointing on the right but she is not looking directly at my finger. She reports she is keeping her eyes straight ahead because it makes her too dizzy to look side to side.  Skin: Skin is warm and dry. There is pallor.  Psychiatric:  Patient appears very ill and uncomfortable. Affect is flat.     ED  Treatments / Results  Labs (all labs ordered are listed, but only abnormal results are displayed) Labs Reviewed  BASIC METABOLIC PANEL - Abnormal; Notable for the following:       Result Value   Glucose, Bld 177 (*)    BUN 26 (*)    Creatinine, Ser 1.13 (*)    GFR calc non Af Amer 51 (*)    GFR calc Af Amer 60 (*)    All other components within normal limits  URINALYSIS, ROUTINE W REFLEX MICROSCOPIC - Abnormal; Notable for the following:    Leukocytes, UA SMALL (*)    Bacteria, UA RARE (*)    Squamous Epithelial / LPF 0-5 (*)    Non Squamous Epithelial 0-5 (*)    All other components within normal limits  APTT - Abnormal; Notable for the following:    aPTT 21 (*)    All other components within normal limits  CBG MONITORING, ED - Abnormal; Notable for the following:    Glucose-Capillary 136 (*)    All other components within normal limits  I-STAT CHEM 8, ED - Abnormal; Notable for the following:    BUN 35 (*)    Creatinine, Ser 1.20 (*)    Glucose, Bld 181 (*)    All other components within normal limits  CBC  ETHANOL  PROTIME-INR  DIFFERENTIAL  RAPID URINE DRUG SCREEN, HOSP PERFORMED  TSH  T4, FREE  T3  I-STAT TROPOININ, ED  CBG MONITORING, ED    EKG  EKG Interpretation  Date/Time:  Tuesday August 20 2016 11:31:34 EST Ventricular Rate:  56 PR Interval:    QRS Duration: 93 QT Interval:  466 QTC Calculation: 450 R Axis:   145 Text Interpretation:  SR . no acute ischemic appearance. compared to previous, normalization of lateral [place]. Confirmed by Johnney Killian, MD, Jeannie Done [phone]) on 08/20/2016 11:39:26 AM       Radiology Ct Angio Head W Or Wo Contrast  Result Date: 08/20/2016 CLINICAL DATA:  Headache and vertigo EXAM: CT ANGIOGRAPHY HEAD AND NECK TECHNIQUE: Multidetector CT imaging of the head and neck was performed using the standard protocol during bolus administration  of intravenous contrast. Multiplanar CT image reconstructions and MIPs were obtained to  evaluate the vascular anatomy. Carotid stenosis measurements (when applicable) are obtained utilizing NASCET criteria, using the distal internal carotid diameter as the denominator. CONTRAST:  50 mL Isovue 370 IV COMPARISON:  Head CT 08/20/2016 FINDINGS: CT HEAD FINDINGS Brain: No mass lesion, intraparenchymal hemorrhage or extra-axial collection. No evidence of acute cortical infarct. Mild volume loss. There is periventricular hypoattenuation compatible with chronic microvascular disease. Vascular: No hyperdense vessel or unexpected calcification. Skull: Normal visualized skull base, calvarium and extracranial soft tissues. Sinuses/Orbits: No sinus fluid levels or advanced mucosal thickening. No mastoid effusion. Normal orbits. CTA NECK FINDINGS Aortic arch: There is no aneurysm or dissection of the visualized ascending aorta or aortic arch. There is a normal variant aortic arch branching pattern with the brachiocephalic and left common carotid arteries sharing a common origin. The visualized proximal subclavian arteries are normal. Mild aortic arch atherosclerotic calcification. Right carotid system: The right common carotid origin is widely patent. There is no common carotid or internal carotid artery dissection or aneurysm. There is mild atherosclerotic calcification at the carotid bifurcation without hemodynamically significant stenosis. Left carotid system: The left common carotid origin is widely patent. There is no common carotid or internal carotid artery dissection or aneurysm. No hemodynamically significant stenosis. Vertebral arteries: The vertebral system is left dominant. Both vertebral artery origins are normal. There is progressive narrowing of the right vertebral artery, which becomes occluded at the C5 level. Minimal opacification of the distal right V4 segment likely secondary to retrograde flow. The left vertebral artery is normal. Skeleton: There is no bony spinal canal stenosis. No lytic or  blastic lesions. Other neck: The nasopharynx is clear. The oropharynx and hypopharynx are normal. The epiglottis is normal. The supraglottic larynx, glottis and subglottic larynx are normal. No retropharyngeal collection. The parapharyngeal spaces are preserved. The parotid and submandibular glands are normal. No sialolithiasis or salivary ductal dilatation. There is a heterogeneous left thyroid nodule measuring 2 cm. There is no cervical lymphadenopathy. Upper chest: No pneumothorax or pleural effusion. No nodules or masses. Review of the MIP images confirms the above findings CTA HEAD FINDINGS Anterior circulation: --Intracranial internal carotid arteries: Normal. --Anterior cerebral arteries: Normal. --Middle cerebral arteries: Normal. --Posterior communicating arteries: Absent bilaterally. Posterior circulation: --Posterior cerebral arteries: Normal. --Superior cerebellar arteries: Normal. --Basilar artery: Normal. --Anterior inferior cerebellar arteries: Present on the left. Not clearly seen on the right. --Posterior inferior cerebellar arteries: Normal on the left. Not seen on the right. Venous sinuses: As permitted by contrast timing, patent. Anatomic variants: None Delayed phase: No parenchymal contrast enhancement. Review of the MIP images confirms the above findings IMPRESSION: 1. Tapering of the right vertebral artery, which becomes occluded at the level of C5. The appearance is concerning for dissection. 2. Non opacification of the right anterior and posterior inferior cerebellar arteries. Given the clinical context, the findings are compatible with posterior fossa ischemia. Further evaluation with MRI may be helpful to evaluate for an acute cerebellar infarct. 3. 2 cm left thyroid nodule. Nonemergent follow-up with dedicated thyroid ultrasound is recommended. 4. Variant arch anatomy with the brachiocephalic and left common carotid arteries sharing a common origin. No right subclavian stenosis. Wide  patency of right vertebral artery origin. Critical Value/emergent results were called by telephone at the time of interpretation on 08/20/2016 at 3:08 pm to Dr. Charlesetta Shanks , who verbally acknowledged these results. Electronically Signed   By: Ulyses Jarred M.D.   On: 08/20/2016  15:16   Ct Angio Neck W And/or Wo Contrast  Result Date: 08/20/2016 CLINICAL DATA:  Headache and vertigo EXAM: CT ANGIOGRAPHY HEAD AND NECK TECHNIQUE: Multidetector CT imaging of the head and neck was performed using the standard protocol during bolus administration of intravenous contrast. Multiplanar CT image reconstructions and MIPs were obtained to evaluate the vascular anatomy. Carotid stenosis measurements (when applicable) are obtained utilizing NASCET criteria, using the distal internal carotid diameter as the denominator. CONTRAST:  50 mL Isovue 370 IV COMPARISON:  Head CT 08/20/2016 FINDINGS: CT HEAD FINDINGS Brain: No mass lesion, intraparenchymal hemorrhage or extra-axial collection. No evidence of acute cortical infarct. Mild volume loss. There is periventricular hypoattenuation compatible with chronic microvascular disease. Vascular: No hyperdense vessel or unexpected calcification. Skull: Normal visualized skull base, calvarium and extracranial soft tissues. Sinuses/Orbits: No sinus fluid levels or advanced mucosal thickening. No mastoid effusion. Normal orbits. CTA NECK FINDINGS Aortic arch: There is no aneurysm or dissection of the visualized ascending aorta or aortic arch. There is a normal variant aortic arch branching pattern with the brachiocephalic and left common carotid arteries sharing a common origin. The visualized proximal subclavian arteries are normal. Mild aortic arch atherosclerotic calcification. Right carotid system: The right common carotid origin is widely patent. There is no common carotid or internal carotid artery dissection or aneurysm. There is mild atherosclerotic calcification at the carotid  bifurcation without hemodynamically significant stenosis. Left carotid system: The left common carotid origin is widely patent. There is no common carotid or internal carotid artery dissection or aneurysm. No hemodynamically significant stenosis. Vertebral arteries: The vertebral system is left dominant. Both vertebral artery origins are normal. There is progressive narrowing of the right vertebral artery, which becomes occluded at the C5 level. Minimal opacification of the distal right V4 segment likely secondary to retrograde flow. The left vertebral artery is normal. Skeleton: There is no bony spinal canal stenosis. No lytic or blastic lesions. Other neck: The nasopharynx is clear. The oropharynx and hypopharynx are normal. The epiglottis is normal. The supraglottic larynx, glottis and subglottic larynx are normal. No retropharyngeal collection. The parapharyngeal spaces are preserved. The parotid and submandibular glands are normal. No sialolithiasis or salivary ductal dilatation. There is a heterogeneous left thyroid nodule measuring 2 cm. There is no cervical lymphadenopathy. Upper chest: No pneumothorax or pleural effusion. No nodules or masses. Review of the MIP images confirms the above findings CTA HEAD FINDINGS Anterior circulation: --Intracranial internal carotid arteries: Normal. --Anterior cerebral arteries: Normal. --Middle cerebral arteries: Normal. --Posterior communicating arteries: Absent bilaterally. Posterior circulation: --Posterior cerebral arteries: Normal. --Superior cerebellar arteries: Normal. --Basilar artery: Normal. --Anterior inferior cerebellar arteries: Present on the left. Not clearly seen on the right. --Posterior inferior cerebellar arteries: Normal on the left. Not seen on the right. Venous sinuses: As permitted by contrast timing, patent. Anatomic variants: None Delayed phase: No parenchymal contrast enhancement. Review of the MIP images confirms the above findings IMPRESSION:  1. Tapering of the right vertebral artery, which becomes occluded at the level of C5. The appearance is concerning for dissection. 2. Non opacification of the right anterior and posterior inferior cerebellar arteries. Given the clinical context, the findings are compatible with posterior fossa ischemia. Further evaluation with MRI may be helpful to evaluate for an acute cerebellar infarct. 3. 2 cm left thyroid nodule. Nonemergent follow-up with dedicated thyroid ultrasound is recommended. 4. Variant arch anatomy with the brachiocephalic and left common carotid arteries sharing a common origin. No right subclavian stenosis. Wide patency of right  vertebral artery origin. Critical Value/emergent results were called by telephone at the time of interpretation on 08/20/2016 at 3:08 pm to Dr. Charlesetta Shanks , who verbally acknowledged these results. Electronically Signed   By: Ulyses Jarred M.D.   On: 08/20/2016 15:16   Ct Head Code Stroke W/o Cm  Result Date: 08/20/2016 CLINICAL DATA:  Code stroke. Severe dizziness with RIGHT-sided headache beginning earlier today. EXAM: CT HEAD WITHOUT CONTRAST TECHNIQUE: Contiguous axial images were obtained from the base of the skull through the vertex without intravenous contrast. COMPARISON:  12/19/2014. FINDINGS: Brain: No evidence for acute infarction, hemorrhage, mass lesion, hydrocephalus, or extra-axial fluid. Mild atrophy. Slight hypoattenuation of white matter, favored to represent chronic microvascular ischemic change. Vascular: Mild vascular calcification in the distal vertebral arteries and carotid siphon segments. No signs of large vessel occlusion. Skull: Normal. Negative for fracture or focal lesion. Sinuses/Orbits: No acute finding. Other: None. ASPECTS [facility] Stroke Program Early CT Score) - Ganglionic level infarction (caudate, lentiform nuclei, internal capsule, insula, M1-M3 cortex): 7 - Supraganglionic infarction (M4-M6 cortex): 3 Total score (0-10 with 10  being normal): 10 IMPRESSION: 1. Atrophy and small vessel disease. No acute intracranial findings. No findings suggestive of large vessel occlusion. 2. ASPECTS is 10 These results were called by telephone at the time of interpretation on 08/20/2016 at 12:15 pm to Dr. Leonel Ramsay , who verbally acknowledged these results. Electronically Signed   By: Staci Righter M.D.   On: 08/20/2016 12:16    Procedures Procedures (including critical care time) CRITICAL CARE Performed by: Charlesetta Shanks   Total critical care time: 45 minutes  Critical care time was exclusive of separately billable procedures and treating other patients.  Critical care was necessary to treat or prevent imminent or life-threatening deterioration.  Critical care was time spent personally by me on the following activities: development of treatment plan with patient and/or surrogate as well as nursing, discussions with consultants, evaluation of patient's response to treatment, examination of patient, obtaining history from patient or surrogate, ordering and performing treatments and interventions, ordering and review of laboratory studies, ordering and review of radiographic studies, pulse oximetry and re-evaluation of patient's condition. Medications Ordered in ED Medications  morphine 4 MG/ML injection 4 mg (0 mg Intravenous Hold 08/20/16 1446)  insulin aspart (novoLOG) injection 0-9 Units (not administered)  ondansetron (ZOFRAN) injection 4 mg (4 mg Intravenous Given 08/20/16 1209)  0.9 %  sodium chloride infusion ( Intravenous New Bag/Given 08/20/16 1304)  sodium chloride 0.9 % bolus 1,000 mL (1,000 mLs Intravenous New Bag/Given 08/20/16 1348)  iopamidol (ISOVUE-370) 76 % injection (50 mLs  Contrast Given 08/20/16 1422)     Initial Impression / Assessment and Plan / ED Course  I have reviewed the triage vital signs and the nursing notes.  Pertinent labs & imaging results that were available during my care of the patient were  reviewed by me and considered in my medical decision making (see chart for details).     Consult: Code stroke activated. Dr. Leonel Ramsay has evaluated the patient. Consult:(3:10) Dr. Estanislado Pandy will review CTA. Final Clinical Impressions(s) / ED Diagnoses   Final diagnoses:  Vertebral artery dissection [facility])   Patient presents with onset of severe vertigo. As well she has right-sided headache. CT angiogram identifies vertebral artery occlusion consistent with dissection. Dr. Leonel Ramsay is initiating antiplatelet agents and advises for hospital admission for pain control and monitoring. Patient has had episodes of bradycardia as low as 39. Mostly heart rates have been in the low 50s.  Blood pressure however has remained stable with no episodes of hypotension. Review of EMR indicates the patient does have a baseline bradycardia. She is not on any beta blockers. Cardiology was consulted on nonemergent basis for her bradycardia. She has been seen as a patient with cardiology for prior MI and bradycardia precluding use of beta blockers. New Prescriptions New Prescriptions   No medications on file     Charlesetta Shanks, MD 08/28/16 1407

## 2016-08-20 NOTE — Progress Notes (Signed)
Pt's HR has been in the low 40s since she arrived to floor.  Cardiology at bedside.   Ave Filter, RN

## 2016-08-20 NOTE — ED Notes (Addendum)
Spoke with MRI regarding delay in MRI - Patient and RN on [facility] updated on delay and pt to be transported to [facility] prior to MRI.

## 2016-08-20 NOTE — Telephone Encounter (Signed)
New Message   Pt c/o BP issue: STAT if pt c/o blurred vision, one-sided weakness or slurred speech  1. What are your last 5 BP readings? Sunday 156/121 P 107, Today 123/68  2. Are you having any other symptoms (ex. Dizziness, headache, blurred vision, passed out)? Dizzy Sunday, Feels fine now  3. What is your BP issue? Per pt on Sunday BP went up, and she currently feels fine. Just wanted to inform nurse of the episode she had

## 2016-08-20 NOTE — ED Notes (Signed)
EKG lab contacted and EKG from 1304 removed from chart due to lead misplacement. EKG repeated with proper placement.

## 2016-08-20 NOTE — ED Notes (Signed)
Pt's CBG result was 136. Informed Nicki - RN and Raquel Sarna - RN.

## 2016-08-20 NOTE — ED Notes (Signed)
Pt noted to have HR in the 40s. Pt again reporting dizziness and Headache. Md made aware of changes.

## 2016-08-20 NOTE — Progress Notes (Signed)
Patient to radiology for MRI

## 2016-08-20 NOTE — Consult Note (Signed)
Requesting Physician: Dr. Johnney Killian    Chief Complaint:   History obtained from:  Patient    HPI:                                                                                                                                         Elaine Jackson is an 61 y.o. female patient with a history of migraine headaches however these headaches ceased once she was pregnant this is approximately 35 years ago. 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 Patient arrived at [facility] and at that time patient was complaining of a constant headache that was located on the posterior aspect of her right neck and right temporal region along with right periorbital region. CT of head was obtained and showed no intracranial abnormalities. Blood pressure was not elevated at that time. Patient admits that she has had migraine headaches in the past which were very similar to that what she was experiencing. Over the last few years she is also had intermittent ocular migraines that did not consist of a actual headache however she did have scotoma and wavy lines. Currently she is photophobic and keeping her eyes closed. She denies any numbness, tingling, weakness, nausea. At this time she'll be kept in the TIA window  Date last known well: Date: 08/20/2016 Time last known well: Time: 10:30 tPA Given: No: symptoms I,proving and non-focal exam   Past Medical History:  Diagnosis Date  . Allergic rhinitis, cause unspecified   . Allergy   . CAD (coronary artery disease)    LHC 12/20/2014 showed small to moderate apical infarction due to distal LAD stenosis with reestablished TIMI2 flow. No PCI. Recommended plavix for 1 yr and ASA for life.   . Chicken pox   . Diabetes mellitus   . Family history of breast cancer in first degree relative    sister age 51  . Heart  murmur   . Hyperlipidemia   . Hypertension   . Malignant neoplasm skin of face 02/16/2007   Basal Cell Carcinoma  [place].  . Measles   . Obesity, unspecified   . Other chronic nonalcoholic liver disease   . Sleep related leg cramps   . Unspecified vitamin D deficiency     Past Surgical History:  Procedure Laterality Date  . CARDIAC CATHETERIZATION N/A 12/20/2014   Procedure: Left Heart Cath and Coronary Angiography;  Surgeon: Sanda Klein, MD;  Location: [facility];  Service: Cardiovascular;  Laterality: N/A;  . CHOLECYSTECTOMY    . COLONOSCOPY  06/25/2007   [place]. Normal. Repeat in ten years.  Marland Kitchen LAPAROSCOPIC GASTRIC BANDING  07/03/10   Dr. Sherrin Daisy; [facility]  . TONSILLECTOMY      Family History  Problem Relation Age of Onset  .  Diabetes Mother   . Heart disease Mother     defibrillator; carotid artery stenosis.  . Hypertension Mother   . Hyperlipidemia Mother   . Dementia Mother   . Congestive Heart Failure Mother   . Heart disease Father   . Kidney failure Father     ESRD/peritoneal dialysis  . Diabetes Father   . Hypertension Father   . Hyperlipidemia Father   . Stroke Father   . Kidney disease Father   . Congestive Heart Failure Father   . Diabetes Sister   . Hypertension Sister   . Kidney disease Sister     renal failure  . Diabetes Brother   . Hypertension Brother   . Hyperlipidemia Brother   . Arthritis Brother   . Cervical cancer Sister   . Hypertension Sister   . Liver cancer Sister 71  . Stroke Maternal Grandmother   . Heart disease Maternal Grandfather   . Stroke Paternal Grandmother   . Heart disease Paternal Grandmother   . Heart disease Paternal Grandfather   . Stroke Paternal Grandfather   . Breast cancer Sister 81  . Hepatitis C Sister 26   Social History:  reports that she has quit smoking. Her smoking use included Cigarettes. She has a 25.00 pack-year smoking history. She has never used smokeless tobacco. She reports that she  does not drink alcohol or use drugs.  Allergies:  Allergies  Allergen Reactions  . Sulfa Antibiotics Nausea And Vomiting    Medications:                                                                                                                           No current facility-administered medications for this encounter.    Current Outpatient Prescriptions  Medication Sig Dispense Refill  . aspirin EC 81 MG EC tablet Take 1 tablet (81 mg total) by mouth daily.    Marland Kitchen atorvastatin (LIPITOR) 80 MG tablet TAKE 1 TABLET (80 MG TOTAL) BY MOUTH DAILY AT 6 PM. 90 tablet 3  . cholecalciferol (VITAMIN D) 1000 UNITS tablet Take 2,000 Units by mouth daily.    . clopidogrel (PLAVIX) 75 MG tablet Take 1 tablet (75 mg total) by mouth daily. (Patient not taking: Reported on 05/01/2016) 90 tablet 3  . fluticasone (FLONASE) 50 MCG/ACT nasal spray Place 2 sprays into both nostrils daily as needed. For allergies  11  . Lancets (ONETOUCH ULTRASOFT) lancets Use as instructed -- check sugar once daily One touch ultra 2 100 each 3  . losartan-hydrochlorothiazide (HYZAAR) 100-25 MG tablet Take 1 tablet by mouth daily. 90 tablet 3  . montelukast (SINGULAIR) 10 MG tablet TAKE 1 TABLET (10 MG TOTAL) BY MOUTH AT BEDTIME. 90 tablet 3  . nitroGLYCERIN (NITROSTAT) 0.4 MG SL tablet Place 1 tablet (0.4 mg total) under the tongue every 5 (five) minutes x 3 doses as needed for chest pain. 25 tablet 3  . ONE TOUCH ULTRA TEST test strip USE TO TEST BLOOD  SUGAR ONCE DAILY. E11.9 100 each 0     ROS:                                                                                                                                       History obtained from the patient  General ROS: negative for - chills, fatigue, fever, night sweats, weight gain or weight loss Psychological ROS: negative for - behavioral disorder, hallucinations, memory difficulties, mood swings or suicidal ideation Ophthalmic ROS: positivefor -  photophobia ENT ROS: negative for - epistaxis, nasal discharge, oral lesions, sore throat, tinnitus or vertigo Allergy and Immunology ROS: negative for - hives or itchy/watery eyes Hematological and Lymphatic ROS: negative for - bleeding problems, bruising or swollen lymph nodes Endocrine ROS: negative for - galactorrhea, hair pattern changes, polydipsia/polyuria or temperature intolerance Respiratory ROS: negative for - cough, hemoptysis, shortness of breath or wheezing Cardiovascular ROS: negative for - chest pain, dyspnea on exertion, edema or irregular heartbeat Gastrointestinal ROS: Positive for - nausea Genito-Urinary ROS: negative for - dysuria, hematuria, incontinence or urinary frequency/urgency Musculoskeletal ROS: negative for - joint swelling or muscular weakness Neurological ROS: as noted in HPI Dermatological ROS: negative for rash and skin lesion changes  Neurologic Examination:                                                                                                      Blood pressure 142/83, pulse 62, temperature 97.6 F (36.4 C), temperature source Oral, resp. rate 15, SpO2 99 %.  HEENT-  Normocephalic, no lesions, without obvious abnormality.  Normal external eye and conjunctiva.  Normal TM's bilaterally.  Normal auditory canals and external ears. Normal external nose, mucus membranes and septum.  Normal pharynx. Cardiovascular- S1, S2 normal, pulses palpable throughout   Lungs- chest clear, no wheezing, rales, normal symmetric air entry Abdomen- soft, non-tender; bowel sounds normal; no masses,  no organomegaly Extremities- no edema Lymph-no adenopathy palpable Musculoskeletal-no joint tenderness, deformity or swelling Skin-warm and dry, no hyperpigmentation, vitiligo, or suspicious lesions  Neurological Examination Mental Status: Alert, oriented, thought content appropriate.  Speech fluent without evidence of aphasia.  Able to follow 3 step commands without  difficulty. Cranial Nerves: II:  Visual fields grossly normal,  III,IV, VI: ptosis not present, extra-ocular motions intact bilaterally, pupils equal, round, reactive to light and accommodation V,VII: smile symmetric, facial light touch sensation normal bilaterally VIII: hearing normal bilaterally IX,X: uvula rises symmetrically XI: bilateral shoulder shrug XII: midline tongue extension Motor: Right :  Upper extremity   5/5    Left:     Upper extremity   5/5  Lower extremity   5/5     Lower extremity   5/5 Tone and bulk:normal tone throughout; no atrophy noted Sensory: Pinprick and light touch intact throughout, bilaterally Deep Tendon Reflexes: 2+ and symmetric throughout Plantars: Right: downgoing   Left: downgoing Cerebellar: normal finger-to-nose,  and normal heel-to-shin test Gait: Not tested       Lab Results: Basic Metabolic Panel:  Recent Labs Lab 08/20/16 1127 08/20/16 1155  NA 137 139  K 4.4 4.4  CL 102 102  CO2 22  --   GLUCOSE 177* 181*  BUN 26* 35*  CREATININE 1.13* 1.20*  CALCIUM 9.8  --     Liver Function Tests: No results for input(s): AST, ALT, ALKPHOS, BILITOT, PROT, ALBUMIN in the last 168 hours. No results for input(s): LIPASE, AMYLASE in the last 168 hours. No results for input(s): AMMONIA in the last 168 hours.  CBC:  Recent Labs Lab 08/20/16 1127 08/20/16 1155  WBC 9.0  --   HGB 12.2 12.6  HCT 37.8 37.0  MCV 93.6  --   PLT 243  --     Cardiac Enzymes: No results for input(s): CKTOTAL, CKMB, CKMBINDEX, TROPONINI in the last 168 hours.  Lipid Panel: No results for input(s): CHOL, TRIG, HDL, CHOLHDL, VLDL, LDLCALC in the last 168 hours.  CBG: No results for input(s): GLUCAP in the last 168 hours.  Microbiology: Results for orders placed or performed during the hospital encounter of 12/19/14  MRSA PCR Screening     Status: None   Collection Time: 12/19/14 10:49 PM  Result Value Ref Range Status   MRSA by PCR NEGATIVE NEGATIVE  Final    Comment:        The GeneXpert MRSA Assay (FDA approved for NASAL specimens only), is one component of a comprehensive MRSA colonization surveillance program. It is not intended to diagnose MRSA infection nor to guide or monitor treatment for MRSA infections.     Coagulation Studies: No results for input(s): LABPROT, INR in the last 72 hours.  Imaging: Ct Head Code Stroke W/o Cm  Result Date: 08/20/2016 CLINICAL DATA:  Code stroke. Severe dizziness with RIGHT-sided headache beginning earlier today. EXAM: CT HEAD WITHOUT CONTRAST TECHNIQUE: Contiguous axial images were obtained from the base of the skull through the vertex without intravenous contrast. COMPARISON:  12/19/2014. FINDINGS: Brain: No evidence for acute infarction, hemorrhage, mass lesion, hydrocephalus, or extra-axial fluid. Mild atrophy. Slight hypoattenuation of white matter, favored to represent chronic microvascular ischemic change. Vascular: Mild vascular calcification in the distal vertebral arteries and carotid siphon segments. No signs of large vessel occlusion. Skull: Normal. Negative for fracture or focal lesion. Sinuses/Orbits: No acute finding. Other: None. ASPECTS [facility] Stroke Program Early CT Score) - Ganglionic level infarction (caudate, lentiform nuclei, internal capsule, insula, M1-M3 cortex): 7 - Supraganglionic infarction (M4-M6 cortex): 3 Total score (0-10 with 10 being normal): 10 IMPRESSION: 1. Atrophy and small vessel disease. No acute intracranial findings. No findings suggestive of large vessel occlusion. 2. ASPECTS is 10 These results were called by telephone at the time of interpretation on 08/20/2016 at 12:15 pm to Dr. Leonel Ramsay , who verbally acknowledged these results. Electronically Signed   By: Staci Righter M.D.   On: 08/20/2016 12:16       Assessment and plan discussed with with attending physician and they are in agreement.    Etta Quill PA-C [facility] [phone]  08/20/2016, 12:22 PM   Assessment: 61 y.o. female with sudden onset of right-sided headache associated with sensation of heavy headedness and nausea. CT of head showed no intracranial abnormalities. NIHSS of 0, no indication for IV tPA. On review of CTA, looks like a likely vertebral  Occlusion, possibly dissection.   Stroke Risk Factors - diabetes mellitus, hyperlipidemia and hypertension  Recommend: 1) ASA 81mg  + plavix, 300mg  followed by 75mg  daily.  2) MRI/MRA head to further assess if there is intracranial component.  3) PT,OT,ST 4) goal SBP 120 - 160, she was asymptomatic in the low 120s.  5) Stroke team to follow.  6) Q2H neuro checks.   Roland Rack, MD [facility] [phone]  If 7pm- 7am, please page neurology on call as listed in [place].

## 2016-08-20 NOTE — ED Notes (Signed)
EDP made aware of pt and symptoms

## 2016-08-21 ENCOUNTER — Other Ambulatory Visit (HOSPITAL_COMMUNITY): Payer: BLUE CROSS/BLUE SHIELD

## 2016-08-21 DIAGNOSIS — I69393 Ataxia following cerebral infarction: Secondary | ICD-10-CM | POA: Diagnosis not present

## 2016-08-21 DIAGNOSIS — Z9884 Bariatric surgery status: Secondary | ICD-10-CM | POA: Diagnosis not present

## 2016-08-21 DIAGNOSIS — Z6836 Body mass index (BMI) 36.0-36.9, adult: Secondary | ICD-10-CM | POA: Diagnosis not present

## 2016-08-21 DIAGNOSIS — G43909 Migraine, unspecified, not intractable, without status migrainosus: Secondary | ICD-10-CM | POA: Diagnosis present

## 2016-08-21 DIAGNOSIS — G450 Vertebro-basilar artery syndrome: Secondary | ICD-10-CM | POA: Diagnosis not present

## 2016-08-21 DIAGNOSIS — E785 Hyperlipidemia, unspecified: Secondary | ICD-10-CM

## 2016-08-21 DIAGNOSIS — K5901 Slow transit constipation: Secondary | ICD-10-CM | POA: Diagnosis not present

## 2016-08-21 DIAGNOSIS — I10 Essential (primary) hypertension: Secondary | ICD-10-CM

## 2016-08-21 DIAGNOSIS — Z7951 Long term (current) use of inhaled steroids: Secondary | ICD-10-CM | POA: Diagnosis not present

## 2016-08-21 DIAGNOSIS — I7774 Dissection of vertebral artery: Secondary | ICD-10-CM | POA: Diagnosis present

## 2016-08-21 DIAGNOSIS — R42 Dizziness and giddiness: Secondary | ICD-10-CM | POA: Diagnosis present

## 2016-08-21 DIAGNOSIS — R001 Bradycardia, unspecified: Secondary | ICD-10-CM | POA: Diagnosis present

## 2016-08-21 DIAGNOSIS — Z85828 Personal history of other malignant neoplasm of skin: Secondary | ICD-10-CM | POA: Diagnosis not present

## 2016-08-21 DIAGNOSIS — E559 Vitamin D deficiency, unspecified: Secondary | ICD-10-CM | POA: Diagnosis present

## 2016-08-21 DIAGNOSIS — E041 Nontoxic single thyroid nodule: Secondary | ICD-10-CM | POA: Diagnosis present

## 2016-08-21 DIAGNOSIS — I639 Cerebral infarction, unspecified: Secondary | ICD-10-CM | POA: Diagnosis present

## 2016-08-21 DIAGNOSIS — E1151 Type 2 diabetes mellitus with diabetic peripheral angiopathy without gangrene: Secondary | ICD-10-CM | POA: Diagnosis present

## 2016-08-21 DIAGNOSIS — D62 Acute posthemorrhagic anemia: Secondary | ICD-10-CM | POA: Diagnosis not present

## 2016-08-21 DIAGNOSIS — E669 Obesity, unspecified: Secondary | ICD-10-CM | POA: Diagnosis not present

## 2016-08-21 DIAGNOSIS — Z8669 Personal history of other diseases of the nervous system and sense organs: Secondary | ICD-10-CM | POA: Diagnosis not present

## 2016-08-21 DIAGNOSIS — Z7982 Long term (current) use of aspirin: Secondary | ICD-10-CM | POA: Diagnosis not present

## 2016-08-21 DIAGNOSIS — E7800 Pure hypercholesterolemia, unspecified: Secondary | ICD-10-CM | POA: Diagnosis not present

## 2016-08-21 DIAGNOSIS — Z87891 Personal history of nicotine dependence: Secondary | ICD-10-CM | POA: Diagnosis not present

## 2016-08-21 DIAGNOSIS — E6609 Other obesity due to excess calories: Secondary | ICD-10-CM | POA: Diagnosis present

## 2016-08-21 DIAGNOSIS — E1169 Type 2 diabetes mellitus with other specified complication: Secondary | ICD-10-CM | POA: Diagnosis not present

## 2016-08-21 DIAGNOSIS — R269 Unspecified abnormalities of gait and mobility: Secondary | ICD-10-CM | POA: Diagnosis not present

## 2016-08-21 DIAGNOSIS — Z803 Family history of malignant neoplasm of breast: Secondary | ICD-10-CM | POA: Diagnosis not present

## 2016-08-21 DIAGNOSIS — I252 Old myocardial infarction: Secondary | ICD-10-CM | POA: Diagnosis not present

## 2016-08-21 DIAGNOSIS — I480 Paroxysmal atrial fibrillation: Secondary | ICD-10-CM | POA: Diagnosis present

## 2016-08-21 DIAGNOSIS — Z7902 Long term (current) use of antithrombotics/antiplatelets: Secondary | ICD-10-CM | POA: Diagnosis not present

## 2016-08-21 DIAGNOSIS — Z8673 Personal history of transient ischemic attack (TIA), and cerebral infarction without residual deficits: Secondary | ICD-10-CM | POA: Diagnosis not present

## 2016-08-21 DIAGNOSIS — Z9049 Acquired absence of other specified parts of digestive tract: Secondary | ICD-10-CM | POA: Diagnosis not present

## 2016-08-21 DIAGNOSIS — I6789 Other cerebrovascular disease: Secondary | ICD-10-CM | POA: Diagnosis not present

## 2016-08-21 DIAGNOSIS — I259 Chronic ischemic heart disease, unspecified: Secondary | ICD-10-CM | POA: Diagnosis not present

## 2016-08-21 DIAGNOSIS — E118 Type 2 diabetes mellitus with unspecified complications: Secondary | ICD-10-CM | POA: Diagnosis not present

## 2016-08-21 DIAGNOSIS — D72829 Elevated white blood cell count, unspecified: Secondary | ICD-10-CM | POA: Diagnosis not present

## 2016-08-21 DIAGNOSIS — N179 Acute kidney failure, unspecified: Secondary | ICD-10-CM | POA: Diagnosis present

## 2016-08-21 DIAGNOSIS — I638 Other cerebral infarction: Secondary | ICD-10-CM | POA: Diagnosis not present

## 2016-08-21 DIAGNOSIS — I2510 Atherosclerotic heart disease of native coronary artery without angina pectoris: Secondary | ICD-10-CM | POA: Diagnosis present

## 2016-08-21 LAB — GLUCOSE, CAPILLARY
GLUCOSE-CAPILLARY: 127 mg/dL — AB (ref 65–99)
GLUCOSE-CAPILLARY: 174 mg/dL — AB (ref 65–99)
Glucose-Capillary: 148 mg/dL — ABNORMAL HIGH (ref 65–99)
Glucose-Capillary: 144 mg/dL — ABNORMAL HIGH (ref 65–99)
Glucose-Capillary: 140 mg/dL — ABNORMAL HIGH (ref 65–99)
Glucose-Capillary: 112 mg/dL — ABNORMAL HIGH (ref 65–99)

## 2016-08-21 LAB — LIPID PANEL
CHOL/HDL RATIO: 2.4 ratio
Cholesterol: 128 mg/dL (ref 0–200)
HDL: 53 mg/dL (ref >40)
LDL Cholesterol: 64 mg/dL (ref 0–99)
Triglycerides: 56 mg/dL (ref <150)
VLDL: 11 mg/dL (ref 0–40)

## 2016-08-21 MED ORDER — ONDANSETRON 4 MG PO TBDP
4.0000 mg | ORAL_TABLET | Freq: Once | ORAL | Status: AC
  Administered 2016-08-21: 4 mg via ORAL
  Filled 2016-08-21: qty 1

## 2016-08-21 NOTE — Evaluation (Signed)
Occupational Therapy Evaluation Patient Details Name: Elaine Jackson MRN: [id number] DOB: Jan 18, 1956 Today's Date: 08/21/2016    History of Present Illness Pt is 61 y/o female admitted secondary to Acute Ischemia in th R cerebellar hemisphere and vertebral artery dissection. PMH includes CAD, HTN, DM, heart murmur, vitamin D deficiency, and skin cancer.    Clinical Impression   PTA, pt was independent with ADL and functional mobility and working full time as a Theme park manager. Pt currently requires min assist for toilet transfer and mod assist for UB dressing tasks. She presents with R gaze preference, decreased R UE proprioception, decreased R UE fine and gross motor coordination, and impulsivity with movement impacting her ability to participate in ADL at PLOF. With functional mobility, pt with R lateral lean and feeling as if she is "falling to the R" and she does run into items on the R with her RW. Feel pt is an excellent CIR candidate for continued rehabilitation services in order to return to PLOF. OT will continue to follow acutely to improve independence with ADL and functional mobility.    Follow Up Recommendations  CIR    Equipment Recommendations  Other (comment) (TBD at next venue of care)    Recommendations for Other Services Rehab consult     Precautions / Restrictions Precautions Precautions: Fall Precaution Comments: Demonstrating decreased steadiness with ambulation with staggering to R. impulsive and moves quickly Restrictions Weight Bearing Restrictions: No      Mobility Bed Mobility Overal bed mobility: Needs Assistance Bed Mobility: Supine to Sit;Sit to Supine     Supine to sit: Min guard Sit to supine: Min guard   General bed mobility comments: Min guard for safety due to reports of dizziness and lean to the R.  Transfers Overall transfer level: Needs assistance Equipment used: Rolling walker (2 wheeled) Transfers: Sit to/from Stand Sit to Stand: Min  assist         General transfer comment: Min assist for steadying after standing.    Balance Overall balance assessment: Needs assistance Sitting-balance support: No upper extremity supported;Feet supported Sitting balance-Leahy Scale: Fair Sitting balance - Comments: Demonstrated R lateral lean and preference to the R.   Standing balance support: Bilateral upper extremity supported;No upper extremity supported;During functional activity Standing balance-Leahy Scale: Fair Standing balance comment: Able to stand at sink without UE support but requires B UE support for dynamic standing activities.                            ADL Overall ADL's : Needs assistance/impaired Eating/Feeding: Supervision/ safety;Set up;Sitting   Grooming: Min guard;Standing   Upper Body Bathing: Minimal assistance;Standing   Lower Body Bathing: Min guard;Sit to/from stand   Upper Body Dressing : Sitting;Moderate assistance   Lower Body Dressing: Min guard;Sit to/from stand   Toilet Transfer: Minimal assistance;Ambulation;RW Toilet Transfer Details (indicate cue type and reason): VC's to slow down and for safety. Min assist due to leaning to the R. Toileting- Clothing Manipulation and Hygiene: Supervision/safety;Sitting/lateral lean       Functional mobility during ADLs: Minimal assistance;Rolling walker General ADL Comments: Pt with significant R preference and running into items on the R.     Vision Patient Visual Report:  (Pain and light sensitivity) Vision Assessment?: Yes Eye Alignment: Within Functional Limits Ocular Range of Motion: Within Functional Limits Alignment/Gaze Preference: Gaze right Tracking/Visual Pursuits: Decreased smoothness of horizontal tracking (Horizontal nystagmus noted during tracking.) Saccades: Within functional limits Visual Fields:  No apparent deficits Additional Comments: Horizontal nystagmus with tracking. Sensitive to light.     Perception      Praxis      Pertinent Vitals/Pain Pain Assessment: 0-10 Pain Score: 5  Pain Location: R cheek  (describes pain as "water up the nose") Pain Descriptors / Indicators: Pressure;Aching Pain Intervention(s): Monitored during session;Repositioned     Hand Dominance Right   Extremity/Trunk Assessment Upper Extremity Assessment Upper Extremity Assessment: RUE deficits/detail RUE Deficits / Details: decreased proprioception and control, sensation intact, strength WFL, shoulder with pain and rotator cuff pathology PTA RUE Coordination: decreased gross motor;decreased fine motor   Lower Extremity Assessment Lower Extremity Assessment: Defer to PT evaluation   Cervical / Trunk Assessment Cervical / Trunk Assessment: Kyphotic   Communication Communication Communication: No difficulties   Cognition Arousal/Alertness: Awake/alert Behavior During Therapy: WFL for tasks assessed/performed;Impulsive Overall Cognitive Status: Within Functional Limits for tasks assessed                 General Comments: Pt is used to being independent, and so can display impulsivity trying to retain independence and especially when she feels her right side is impacting her ability to function   General Comments       Exercises       Shoulder Instructions      Home Living Family/patient expects to be discharged to:: Private residence Living Arrangements: Spouse/significant other Available Help at Discharge: Family;Available 24 hours/day (daughter; 2 grandsons 12&14) Type of Home: House Home Access: Stairs to enter [employer] of Steps: 2 Entrance Stairs-Rails: None Home Layout: One level     Bathroom Shower/Tub: Occupational psychologist: Standard Bathroom Accessibility: Yes How Accessible: Accessible via walker Home Equipment: Cane - single point;Walker - 4 wheels;Grab bars - tub/shower;Wheelchair - manual   Additional Comments: 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      Prior Functioning/Environment Level of Independence: Independent        Comments: full time hair dresser; driving; VERY independent        OT Problem List: Decreased activity tolerance;Impaired balance (sitting and/or standing);Decreased coordination;Decreased safety awareness;Decreased knowledge of use of DME or AE;Impaired vision/perception;Pain;Impaired UE functional use      OT Treatment/Interventions: Self-care/ADL training;Therapeutic exercise;Neuromuscular education;DME and/or AE instruction;Therapeutic activities;Visual/perceptual remediation/compensation;Patient/family education;Balance training    OT Goals(Current goals can be found in the care plan section) Acute Rehab OT Goals Patient Stated Goal: to return home OT Goal Formulation: With patient/family Time For Goal Achievement: 09/04/16 Potential to Achieve Goals: Good ADL Goals Pt Will Perform Grooming: with modified independence;standing Pt Will Transfer to Toilet: with modified independence;ambulating;regular height toilet Pt Will Perform Toileting - Clothing Manipulation and hygiene: with modified independence;sit to/from stand Pt/caregiver will Perform Home Exercise Program: Right Upper extremity;With Supervision;With written HEP provided (fine/gross motor coordination) Additional ADL Goal #1: Pt will demonstrate improved R UE gross motor coordination to style hair independently with no more than 1 episode of overshooting/undershooting.  OT Frequency: Min 3X/week   Barriers to D/C:            Co-evaluation              End of Session Equipment Utilized During Treatment: Gait belt;Rolling walker Nurse Communication: Mobility status  Activity Tolerance: Patient tolerated treatment well (Dizziness throughout.) Patient left: in bed;with call bell/phone within reach;with bed alarm set;with family/visitor present  OT Visit Diagnosis: Hemiplegia and hemiparesis;Unsteadiness on feet  (R26.81) Hemiplegia - Right/Left: Right Hemiplegia -  dominant/non-dominant: Dominant Hemiplegia - caused by: Cerebral infarction                ADL either performed or assessed with clinical judgement  Time: 1033-1100 OT Time Calculation (min): 27 min Charges:  OT General Charges $OT Visit: 1 Procedure OT Evaluation $OT Eval Moderate Complexity: 1 Procedure OT Treatments $Self Care/Home Management : 8-22 mins G-Codes: OT G-codes **NOT FOR INPATIENT CLASS** Functional Assessment Tool Used: AM-PAC 6 Clicks Daily Activity Functional Limitation: Self care Self Care Current Status [id number]): At least 40 percent but less than 60 percent impaired, limited or restricted Self Care Goal Status [id number]): 0 percent impaired, limited or restricted   Norman Herrlich, MS OTR/L  Pager: [place] 08/21/2016, 2:04 PM

## 2016-08-21 NOTE — Evaluation (Signed)
Physical Therapy Evaluation Patient Details Name: Elaine Jackson MRN: [id number] DOB: [birth year] Today's Date: 08/21/2016   History of Present Illness  Pt is 61 y/o female admitted secondary to Acute Ischemia in th R cerebellar hemisphere and vertebral artery dissection. PMH includes CAD, HTN, DM, heart murmur, vitamin D deficiency, and skin cancer.   Clinical Impression  Pt admitted with above diagnosis. Pt currently with functional limitations due to the deficits listed below (see PT Problem List). PTA, pt was independent with all functional mobility and was working as a [employer] park manager. Upon evaluation, pt limited by nausea and dizziness. Pt demonstrating staggering R during ambulation and required mod A to prevent LOB. Pt requiring increased assist with RW management secondary to balance deficits as well. Recommending CIR at d/c to address functional limitations and increase independence with functional mobility. Pt will benefit from skilled PT to increase their independence and safety with mobility to allow discharge to the venue listed below.  Will continue to follow.      Follow Up Recommendations CIR;Supervision/Assistance - 24 hour    Equipment Recommendations  Other (comment) (TBD at next venue)    Recommendations for Other Services Rehab consult     Precautions / Restrictions Precautions Precautions: Fall Precaution Comments: Demonstrating decreased steadiness with ambulation with staggering to R.  Restrictions Weight Bearing Restrictions: No      Mobility  Bed Mobility Overal bed mobility: Needs Assistance Bed Mobility: Supine to Sit;Sit to Supine     Supine to sit: Min guard Sit to supine: Min guard   General bed mobility comments: Demonstrated decreased steadiness upon sitting requiring min guard assist for balance. Reports she feels like she is dizzy and is leaning to the R. Instructed pt to have seated rest break to decrease symptoms.   Transfers Overall transfer  level: Needs assistance Equipment used: 1 person hand held assist Transfers: Sit to/from Stand Sit to Stand: Min assist         General transfer comment: Required min A for steadying once standing.   Ambulation/Gait Ambulation/Gait assistance: Mod assist;+2 safety/equipment Ambulation Distance (Feet): 10 Feet Assistive device: 1 person hand held assist;Rolling walker (2 wheeled) Gait Pattern/deviations: Step-through pattern;Decreased stride length;Staggering right;Drifts right/left;Wide base of support;Trunk flexed Gait velocity: Decreased Gait velocity interpretation: Below normal speed for age/gender General Gait Details: Attempted 1 step with HHA and pt demonstrated LOB to R requiring mod A +2 for regaining balance. Ambulated with RW 10' and pt demonstrating decreased steadiness requiring min-mod A. Required manual assist on L side of walker to prevent tipping to the R. Demonstrated R lateral lean. Gave verbal cues to look straight ahead to help with dizziness. Pt reports nausea with ambulation which limited distance. Pt requested return to bed secondary to nausea. Notified NT about nausea symptoms.   Stairs            Wheelchair Mobility    Modified Rankin (Stroke Patients Only) Modified Rankin (Stroke Patients Only) Pre-Morbid Rankin Score: No symptoms Modified Rankin: Moderately severe disability     Balance Overall balance assessment: Needs assistance Sitting-balance support: Bilateral upper extremity supported;Feet supported Sitting balance-Leahy Scale: Poor Sitting balance - Comments: Demonstrated R lateral lean   Standing balance support: Bilateral upper extremity supported Standing balance-Leahy Scale: Poor Standing balance comment: Required use of BUE on RW to maintain balance.                              Pertinent Vitals/Pain  Pain Assessment: 0-10 Pain Score: 5  Pain Location: R cheek  (feels like water up nose) Pain Descriptors /  Indicators: Pressure;Aching Pain Intervention(s): Limited activity within patient's tolerance;Monitored during session;Repositioned    Home Living Family/patient expects to be discharged to:: Private residence Living Arrangements: Spouse/significant other Available Help at Discharge: Family;Available 24 hours/day Type of Home: House Home Access: Stairs to enter Entrance Stairs-Rails: None Entrance Stairs-Number of Steps: 2 Home Layout: One level Home Equipment: Cane - single point;Walker - 4 wheels;Grab bars - tub/shower;Wheelchair - manual      Prior Function Level of Independence: Independent               Hand Dominance   Dominant Hand: Right    Extremity/Trunk Assessment   Upper Extremity Assessment Upper Extremity Assessment: Defer to OT evaluation    Lower Extremity Assessment Lower Extremity Assessment: Generalized weakness (Grossly 4/5 on BLE )    Cervical / Trunk Assessment Cervical / Trunk Assessment: Kyphotic  Communication   Communication: No difficulties  Cognition Arousal/Alertness: Awake/alert Behavior During Therapy: WFL for tasks assessed/performed Overall Cognitive Status: Within Functional Limits for tasks assessed                      General Comments General comments (skin integrity, edema, etc.): Pt daughter present throughout session. Reports she does not want mother to go to SNF at d/c.     Exercises     Assessment/Plan    PT Assessment Patient needs continued PT services  PT Problem List Decreased strength;Decreased activity tolerance;Decreased balance;Decreased mobility;Decreased coordination;Decreased knowledge of use of DME;Decreased safety awareness;Pain       PT Treatment Interventions DME instruction;Gait training;Functional mobility training;Stair training;Therapeutic activities;Therapeutic exercise;Balance training;Neuromuscular re-education;Patient/family education    PT Goals (Current goals can be found in the  Care Plan section)  Acute Rehab PT Goals Patient Stated Goal: to return home PT Goal Formulation: With patient/family Time For Goal Achievement: 09/04/16 Potential to Achieve Goals: Good    Frequency Min 4X/week   Barriers to discharge        Co-evaluation               End of Session Equipment Utilized During Treatment: Gait belt Activity Tolerance: Treatment limited secondary to medical complications (Comment) (nausea and dizziness) Patient left: in bed;with call bell/phone within reach;with bed alarm set;with family/visitor present Nurse Communication: Mobility status PT Visit Diagnosis: Unsteadiness on feet (R26.81);Muscle weakness (generalized) (M62.81);Other symptoms and signs involving the nervous system (R29.898)    Functional Assessment Tool Used: AM-PAC 6 Clicks Basic Mobility;Clinical judgement Functional Limitation: Mobility: Walking and moving around Mobility: Walking and Moving Around Current Status [id number]): At least 40 percent but less than 60 percent impaired, limited or restricted Mobility: Walking and Moving Around Goal Status [phone]): At least 1 percent but less than 20 percent impaired, limited or restricted    Time: 0859-0920 PT Time Calculation (min) (ACUTE ONLY): 21 min   Charges:   PT Evaluation $PT Eval Moderate Complexity: 1 Procedure PT Treatments $Gait Training: 8-22 mins   PT G Codes:   PT G-Codes **NOT FOR INPATIENT CLASS** Functional Assessment Tool Used: AM-PAC 6 Clicks Basic Mobility;Clinical judgement Functional Limitation: Mobility: Walking and moving around Mobility: Walking and Moving Around Current Status [id number]): At least 40 percent but less than 60 percent impaired, limited or restricted Mobility: Walking and Moving Around Goal Status [phone]): At least 1 percent but less than 20 percent impaired, limited or restricted  Nicky Pugh Leanne 08/21/2016, 10:27 AM  Nicky Pugh, PT, DPT  Acute Rehabilitation Services   Pager: [phone]

## 2016-08-21 NOTE — Care Management Note (Signed)
Case Management Note  Patient Details  Name: Elaine Jackson MRN: [id number] Date of Birth: 22-May-1956  Subjective/Objective:  Pt admitted with CVA. She is from home with her spouse. Pt is active with Primary care at [facility]. She does not have insurance.                 Action/Plan: CIR is the recommendations. CM following for d/c disposition.   Expected Discharge Date:                  Expected Discharge Plan:  [place]  In-House Referral:     Discharge planning Services     Post Acute Care Choice:    Choice offered to:     DME Arranged:    DME Agency:     HH Arranged:    [place] Agency:     Status of Service:  In process, will continue to follow  If discussed at Long Length of Stay Meetings, dates discussed:    Additional Comments:  Pollie Friar, RN 08/21/2016, 1:10 PM

## 2016-08-21 NOTE — Progress Notes (Signed)
STROKE TEAM PROGRESS NOTE   HISTORY OF PRESENT ILLNESS (per record) Elaine Jackson is an 61 y.o. female patient with a history of migraine headaches however these headaches ceased once she was pregnant this is approximately 35 years ago. 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 Patient arrived at [facility] and at that time patient was complaining of a constant headache that was located on the posterior aspect of her right neck and right temporal region along with right periorbital region. CT of head was obtained and showed no intracranial abnormalities. Blood pressure was not elevated at that time. Patient admits that she has had migraine headaches in the past which were very similar to that what she was experiencing. Over the last few years she is also had intermittent ocular migraines that did not consist of a actual headache however she did have scotoma and wavy lines. Currently she is photophobic and keeping her eyes closed. She denies any numbness, tingling, weakness, nausea. At this time she'll be kept in the TIA window. Patient was not administered IV t-PA secondary to symptoms improving and non-focal exam. She was admitted for further evaluation and treatment.   SUBJECTIVE (INTERVAL HISTORY) No family at bedside. She stated that she is much better but getting up still feel dizzy and nausea. She stated that she had a couple of times heart palpitation which woke her up from sleep. She was on ASA and plavix for a year due to CAD with stent, but was told by her cardiologist to stop plavix last November. Currently PTA she is on ASA only.    OBJECTIVE Temp:  [97.5 F (36.4 C)-98.1 F (36.7 C)] 98.1 F (36.7 C) (02/28 0415) Pulse Rate:  [35-64] 64 (02/28 0415) Cardiac Rhythm: Sinus bradycardia (02/27 1900) Resp:  [10-26]  18 (02/28 0415) BP: (96-185)/(33-110) 102/33 (02/28 0415) SpO2:  [92 %-100 %] 100 % (02/28 0415)  CBC:  Recent Labs Lab 08/20/16 1127 08/20/16 1155  WBC 9.0  --   NEUTROABS 5.1  --   HGB 12.2 12.6  HCT 37.8 37.0  MCV 93.6  --   PLT 243  --     Basic Metabolic Panel:  Recent Labs Lab 08/20/16 1127 08/20/16 1155  NA 137 139  K 4.4 4.4  CL 102 102  CO2 22  --   GLUCOSE 177* 181*  BUN 26* 35*  CREATININE 1.13* 1.20*  CALCIUM 9.8  --     Lipid Panel:    Component Value Date/Time   CHOL 128 08/21/2016 0147   TRIG 56 08/21/2016 0147   HDL 53 08/21/2016 0147   CHOLHDL 2.4 08/21/2016 0147   VLDL 11 08/21/2016 0147   LDLCALC 64 08/21/2016 0147   HgbA1c:  Lab Results  Component Value Date   HGBA1C 5.8 (H) 05/01/2016   Urine Drug Screen:    Component Value Date/Time   LABOPIA NONE DETECTED 08/20/2016 1225   COCAINSCRNUR NONE DETECTED 08/20/2016 1225   LABBENZ NONE DETECTED 08/20/2016 1225   AMPHETMU NONE DETECTED 08/20/2016 1225   THCU NONE DETECTED 08/20/2016 1225   LABBARB NONE DETECTED 08/20/2016 1225      IMAGING I have personally reviewed the radiological images below and agree with the radiology interpretations.  Ct Head Code Stroke W/o Cm 08/20/2016 1. Atrophy and small vessel disease. No acute intracranial findings. No findings suggestive  of large vessel occlusion. 2. ASPECTS is 10   Ct Angio Head W Or Wo Contrast Ct Angio Neck W And/or Wo Contrast 08/20/2016 1. Tapering of the right vertebral artery, which becomes occluded at the level of C5. The appearance is concerning for dissection. 2. Non opacification of the right anterior and posterior inferior cerebellar arteries. Given the clinical context, the findings are compatible with posterior fossa ischemia.  3. 2 cm left thyroid nodule. Nonemergent follow-up with dedicated thyroid ultrasound is recommended. 4. Variant arch anatomy with the brachiocephalic and left common carotid arteries sharing a common  origin. No right subclavian stenosis. Wide patency of right vertebral artery origin.   Mr Brain 32 Contrast Mr Jodene Nam Head Wo Contrast 08/20/2016 1. Multiple small foci of acute ischemia within the right cerebellar hemisphere, within the PICA territory. No hematoma or mass effect. 2. 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 3. Old left cerebellar infarct.   TTE - pending   PHYSICAL EXAM  Temp:  [97.5 F (36.4 C)-98.4 F (36.9 C)] 98.4 F (36.9 C) (02/28 1416) Pulse Rate:  [43-64] 56 (02/28 1416) Resp:  [11-26] 20 (02/28 1416) BP: (96-168)/(33-82) 120/50 (02/28 1416) SpO2:  [92 %-100 %] 100 % (02/28 1416)  General - Well nourished, well developed, in no apparent distress.  Ophthalmologic - Fundi not visualized due to noncooperation.  Cardiovascular - Regular rate and rhythm.  Mental Status -  Level of arousal and orientation to time, place, and person were intact. Language including expression, naming, repetition, comprehension was assessed and found intact. Attention span and concentration were normal. Fund of Knowledge was assessed and was intact.  Cranial Nerves II - XII - II - Visual field intact OU. III, IV, VI - Extraocular movements intact. V - Facial sensation intact bilaterally. VII - Facial movement intact bilaterally. VIII - Hearing & vestibular intact bilaterally, no nystagmus. X - Palate elevates symmetrically. XI - Chin turning & shoulder shrug intact bilaterally. XII - Tongue protrusion intact.  Motor Strength - The patient's strength was normal in all extremities and pronator drift was absent.  Bulk was normal and fasciculations were absent.   Motor Tone - Muscle tone was assessed at the neck and appendages and was normal.  Reflexes - The patient's reflexes were 1+ in all extremities and she had no pathological reflexes.  Sensory - Light touch, temperature/pinprick were assessed and were symmetrical.     Coordination - The patient had normal movements in the right hand and feet with no ataxia or dysmetria. However, left FTN dysmetria. Tremor was absent.  Gait and Station - deferred.   ASSESSMENT/PLAN Ms. FOSTINE BRESNAHAN is a 61 y.o. female with history of CAD, hypertension, diabetes, chronic bradycardia, prior laparoscopic gastric banding, vitamin D deficiency, dyslipidemia and migraine HA presenting with sudden onset dizziness, nausea and HA. She did not receive IV t-PA due to symptoms improving and non-focal exam.   Stroke:  right PICA territory infarcts in setting of R VA and R PICA occlusion.  Infarcts secondary to large vessel source  Resultant  Dizziness, left FTN dysmetria  Code stroke CT. Atrophy. Small vessel disease. Aspects 10   CTA head and neck R VA tapering to occlusion. R AICA and R PICA not seen. 2cm left thyroid nodule.   MRI  R PICA territory infarct  MRA  R VA and R PICA occlusion. Old L cerebellar infarct.    2D Echo  pending  Recommend 30  day cardiac event monitoring as outpt to rule out afib due to palpitation episode at home  LDL 64  HgbA1c pending  Lovenox 40 mg sq daily for VTE prophylaxis  Diet Heart Room service appropriate? Yes; Fluid consistency: Thin  aspirin 81 mg daily and clopidogrel 75 mg daily prior to admission, now on aspirin 325 mg daily and clopidogrel 75 mg daily. Continue DAPT for 3 months and then plavix alone  Patient counseled to be compliant with her antithrombotic medications  Ongoing aggressive stroke risk factor management  Therapy recommendations:  pending   Disposition:  pending   Essential Hypertension  Stable  Permissive hypertension (OK if < 220/120) but gradually normalize in 5-7 days  Long-term BP goal normotensive  Hyperlipidemia  Home meds:  Lipitor 80, resumed in hospital  LDL 64, goal < 70  Continue statin at discharge  Diabetes type II  HgbA1c pending, goal < 7.0  Controlled  CBG  140-170s  SSI  Other Stroke Risk Factors  Former Cigarette smoker - quit 20 years ago  Obesity, There is no height or weight on file to calculate BMI.with prior LAP-BAND surgery  Family hx stroke (father, maternal grandmother, paternal grandmother, paternal grandfather)  CAD s/p stent  Migraines  OSA  Other Active Problems  Sinus bradycardia  Acute kidney injury  Left thyroid nodule, incidental finding on CTA  UA WBC 6-30  Hospital day # 0  Neurology will sign off. Please call with questions. Pt will follow up with Cecille Rubin NP at [facility] in about 6 weeks. Thanks for the consult.  [name], MD PhD Stroke Neurology 08/21/2016 3:52 PM   To contact Stroke Continuity provider, please refer to [email or web address] After hours, contact General Neurology

## 2016-08-21 NOTE — Consult Note (Signed)
Physical Medicine and Rehabilitation Consult Reason for Consult: Right cerebellar hemisphere infarct with vertebral artery dissection Referring Physician: [facility]   HPI: Elaine Jackson is a 61 y.o. right handed female with history of CAD status post apical MI in June 2016 maintained on aspirin and Plavix, hypertension, diabetes mellitus, chronic bradycardia, prior laparoscopic gastric banding. Per chart review patient lives with spouse. Independent prior to admission and works as a Theme park manager. One level home with 2 steps to entry. Husband can assist as needed. Presented 08/20/2016 with dizziness/headache, nausea, slurred speech. Cranial CT scan negative. CT angiogram head and neck showed tapering of the right vertebral artery which becomes occluded at the level of C5 concerning for dissection. Patient did not receive TPA. Troponin -0.01. Urine drug screen negative. MRI/MRA of the brain showed multiple small foci of acute ischemia within the right cerebellar hemisphere, within the PICA territory. Old left cerebellar infarct. 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 Echocardiogram is pending. Cardiology consulted for persistent bradycardia which she has a history of into the high 30s and no indication for pacemaker at this time. Currently remains on aspirin and Plavix for CVA prophylaxis. Subcutaneous Lovenox for DVT prophylaxis. Tolerating a regular consistency diet. Physical therapy evaluation completed 08/21/2016 with recommendations of physical medicine rehabilitation consult.  Patient still feels dizzy when she gets out of bed. She vomited when going to the bathroom this morning. She does not feel dizzy when lying in bed anymore. No nausea, but still has poor appetite  Review of Systems  Constitutional: Negative for chills and fever.  HENT: Negative for tinnitus.   Eyes: Negative for blurred vision and double vision.  Respiratory:  Negative for cough and shortness of breath.   Cardiovascular: Positive for palpitations. Negative for chest pain and leg swelling.  Gastrointestinal: Positive for nausea.  Genitourinary: Negative for dysuria and hematuria.  Musculoskeletal: Positive for myalgias.  Skin: Negative for rash.  Neurological: Positive for dizziness, speech change and headaches. Negative for seizures.  All other systems reviewed and are negative.  Past Medical History:  Diagnosis Date  . Allergic rhinitis, cause unspecified   . Allergy   . CAD (coronary artery disease)    LHC 12/20/2014 showed small to moderate apical infarction due to distal LAD stenosis with reestablished TIMI2 flow. No PCI. Recommended plavix for 1 yr and ASA for life.   . Chicken pox   . Diabetes mellitus   . Family history of breast cancer in first degree relative    sister age 42  . Heart murmur   . Hyperlipidemia   . Hypertension   . Malignant neoplasm skin of face 02/16/2007   Basal Cell Carcinoma  [place].  . Measles   . Obesity, unspecified   . Other chronic nonalcoholic liver disease   . Sleep related leg cramps   . Unspecified vitamin D deficiency    Past Surgical History:  Procedure Laterality Date  . CARDIAC CATHETERIZATION N/A 12/20/2014   Procedure: Left Heart Cath and Coronary Angiography;  Surgeon: Sanda Klein, MD;  Location: [facility];  Service: Cardiovascular;  Laterality: N/A;  . CHOLECYSTECTOMY    . COLONOSCOPY  06/25/2007   Hung. Normal. Repeat in ten years.  Marland Kitchen LAPAROSCOPIC GASTRIC BANDING  07/03/10   Dr. Sherrin Daisy; [facility]  . TONSILLECTOMY     Family History  Problem Relation Age of Onset  . Diabetes Mother   . Heart disease Mother  defibrillator; carotid artery stenosis.  . Hypertension Mother   . Hyperlipidemia Mother   . Dementia Mother   . Congestive Heart Failure Mother   . Heart disease Father   . Kidney failure Father     ESRD/peritoneal dialysis  . Diabetes Father   .  Hypertension Father   . Hyperlipidemia Father   . Stroke Father   . Kidney disease Father   . Congestive Heart Failure Father   . Diabetes Sister   . Hypertension Sister   . Kidney disease Sister     renal failure  . Diabetes Brother   . Hypertension Brother   . Hyperlipidemia Brother   . Arthritis Brother   . Cervical cancer Sister   . Hypertension Sister   . Liver cancer Sister 46  . Stroke Maternal Grandmother   . Heart disease Maternal Grandfather   . Stroke Paternal Grandmother   . Heart disease Paternal Grandmother   . Heart disease Paternal Grandfather   . Stroke Paternal Grandfather   . Breast cancer Sister 18  . Hepatitis C Sister 65   Social History:  reports that she has quit smoking. Her smoking use included Cigarettes. She has a 25.00 pack-year smoking history. She has never used smokeless tobacco. She reports that she does not drink alcohol or use drugs. Allergies:  Allergies  Allergen Reactions  . Sulfa Antibiotics Nausea And Vomiting   Medications Prior to Admission  Medication Sig Dispense Refill  . aspirin EC 81 MG EC tablet Take 1 tablet (81 mg total) by mouth daily.    Marland Kitchen atorvastatin (LIPITOR) 80 MG tablet TAKE 1 TABLET (80 MG TOTAL) BY MOUTH DAILY AT 6 PM. 90 tablet 3  . cholecalciferol (VITAMIN D) 1000 UNITS tablet Take 1,000 Units by mouth daily.     . Cyanocobalamin (VITAMIN B-12 PO) Take 1 tablet by mouth daily.    . fluticasone (FLONASE) 50 MCG/ACT nasal spray Place 2 sprays into both nostrils daily as needed. For allergies  11  . Lancets (ONETOUCH ULTRASOFT) lancets Use as instructed -- check sugar once daily One touch ultra 2 100 each 3  . losartan-hydrochlorothiazide (HYZAAR) 100-25 MG tablet Take 1 tablet by mouth daily. 90 tablet 3  . montelukast (SINGULAIR) 10 MG tablet TAKE 1 TABLET (10 MG TOTAL) BY MOUTH AT BEDTIME. 90 tablet 3  . nitroGLYCERIN (NITROSTAT) 0.4 MG SL tablet Place 1 tablet (0.4 mg total) under the tongue every 5 (five) minutes  x 3 doses as needed for chest pain. 25 tablet 3  . ONE TOUCH ULTRA TEST test strip USE TO TEST BLOOD SUGAR ONCE DAILY. E11.9 100 each 0  . clopidogrel (PLAVIX) 75 MG tablet Take 1 tablet (75 mg total) by mouth daily. (Patient not taking: Reported on 05/01/2016) 90 tablet 3    Home: [place] expects to be discharged to:: Private residence Living Arrangements: Spouse/significant other Available Help at Discharge: Family, Available 24 hours/day (daughter; 2 grandsons 12&14) Type of Home: House Home Access: Stairs to enter Technical brewer of Steps: 2 Entrance Stairs-Rails: None Home Layout: One level Bathroom Shower/Tub: Multimedia programmer: Standard Bathroom Accessibility: Yes Home Equipment: Cane - single point, Environmental consultant - 4 wheels, Grab bars - tub/shower, Wheelchair - manual Additional Comments: 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 Reports she feels like she is dizzy and is leaning to the R. Instructed pt to have seated rest break to decrease symptoms.  Transfers Overall transfer level: Needs assistance Equipment used: 1 person hand held assist Transfers: Sit to/from Stand Sit to Stand: Min assist General transfer comment: Required min A for steadying once standing.  Ambulation/Gait Ambulation/Gait assistance: Mod assist, +2 safety/equipment Ambulation Distance (Feet): 10 Feet Assistive device: 1 person hand held assist, Rolling walker (2 wheeled) Gait Pattern/deviations: Step-through pattern, Decreased stride  length, Staggering right, Drifts right/left, Wide base of support, Trunk flexed General Gait Details: Attempted 1 step with HHA and pt demonstrated LOB to R requiring mod A +2 for regaining balance. Ambulated with RW 10' and pt demonstrating decreased steadiness requiring min-mod A. Required manual assist on L side of walker to prevent tipping to the R. Demonstrated R lateral lean. Gave verbal cues to look straight ahead to help with dizziness. Pt reports nausea with ambulation which limited distance. Pt requested return to bed secondary to nausea. Notified NT about nausea symptoms.  Gait velocity: Decreased Gait velocity interpretation: Below normal speed for age/gender    ADL: ADL Overall ADL's : Needs assistance/impaired  Cognition: Cognition Overall Cognitive Status: Within Functional Limits for tasks assessed Orientation Level: Oriented X4 Cognition Arousal/Alertness: Awake/alert Behavior During Therapy: WFL for tasks assessed/performed, Impulsive Overall Cognitive Status: Within Functional Limits for tasks assessed General Comments: Pt is used to being independent, and so can display impulsivity trying to retain independence and especially when she feels her right side is impacting her ability to function  Blood pressure (!) 111/48, pulse (!) 57, temperature 98.2 F (36.8 C), temperature source Oral, resp. rate 19, SpO2 100 %. Physical Exam  Vitals reviewed. Constitutional: She is oriented to person, place, and time. She appears well-developed.  HENT:  Head: Normocephalic.  Eyes: EOM are normal. Left eye exhibits no discharge.  Neck: Normal range of motion. Neck supple. No thyromegaly present.  Cardiovascular:  Cardiac rate controlled 58 bpm  Respiratory: Effort normal and breath sounds normal. No respiratory distress.  GI: Soft. Bowel sounds are normal. She exhibits no distension.  Neurological: She is alert and oriented to person, place, and time.  Follows full commands.  Good awareness of deficits  Skin: Skin is warm and dry.  Mild-to-moderate dysmetria, right finger-nose-finger, no dysmetria noted with heel-to-shin. Normal finger-nose-finger. Left side. Motor strength is 4/5 in the right deltoid by suppressed grip 5/5 in the left deltoid, biceps, triceps, grip 5/5 bilateral hip flexor, knee extensor, ankle dorsal flexor. Sensation intact to light touch bilateral upper and lower limbs. Extraocular motions intact, no evidence of nystagmus  Results for orders placed or performed during the hospital encounter of 08/20/16 (from the past 24 hour(s))  I-Stat Chem 8, ED  (not at [facility], [facility])     Status: Abnormal   Collection Time: 08/20/16 11:55 AM  Result Value Ref Range   Sodium 139 135 - 145 mmol/L   Potassium 4.4 3.5 - 5.1 mmol/L   Chloride 102 101 - 111 mmol/L   BUN 35 (H) 6 - 20 mg/dL   Creatinine, Ser 1.20 (H) 0.44 - 1.00 mg/dL   Glucose, Bld 181 (H) 65 - 99 mg/dL   Calcium, Ion 1.19 1.15 - 1.40 mmol/L   TCO2 28 0 - 100 mmol/L   Hemoglobin  12.6 12.0 - 15.0 g/dL   HCT 37.0 36.0 - 46.0 %  Ethanol     Status: None   Collection Time: 08/20/16 12:05 PM  Result Value Ref Range   Alcohol, Ethyl (B) <5 <5 mg/dL  Urinalysis, Routine w reflex microscopic     Status: Abnormal   Collection Time: 08/20/16 12:25 PM  Result Value Ref Range   Color, Urine YELLOW YELLOW   APPearance CLEAR CLEAR   Specific Gravity, Urine 1.014 1.005 - 1.030   pH 5.0 5.0 - 8.0   Glucose, UA NEGATIVE NEGATIVE mg/dL   Hgb urine dipstick NEGATIVE NEGATIVE   Bilirubin Urine NEGATIVE NEGATIVE   Ketones, ur NEGATIVE NEGATIVE mg/dL   Protein, ur NEGATIVE NEGATIVE mg/dL   Nitrite NEGATIVE NEGATIVE   Leukocytes, UA SMALL (A) NEGATIVE   RBC / HPF 0-5 0 - 5 RBC/hpf   WBC, UA 6-30 0 - 5 WBC/hpf   Bacteria, UA RARE (A) NONE SEEN   Squamous Epithelial / LPF 0-5 (A) NONE SEEN   Non Squamous Epithelial 0-5 (A) NONE SEEN  Urine rapid drug screen (hosp performed)not at [facility]     Status: None    Collection Time: 08/20/16 12:25 PM  Result Value Ref Range   Opiates NONE DETECTED NONE DETECTED   Cocaine NONE DETECTED NONE DETECTED   Benzodiazepines NONE DETECTED NONE DETECTED   Amphetamines NONE DETECTED NONE DETECTED   Tetrahydrocannabinol NONE DETECTED NONE DETECTED   Barbiturates NONE DETECTED NONE DETECTED  CBG monitoring, ED     Status: Abnormal   Collection Time: 08/20/16  3:05 PM  Result Value Ref Range   Glucose-Capillary 136 (H) 65 - 99 mg/dL   Comment 1 Notify RN    Comment 2 Document in Chart   TSH     Status: None   Collection Time: 08/20/16  6:53 PM  Result Value Ref Range   TSH 0.564 0.350 - 4.500 uIU/mL  T4, free     Status: None   Collection Time: 08/20/16  6:53 PM  Result Value Ref Range   Free T4 1.09 0.61 - 1.12 ng/dL  Glucose, capillary     Status: Abnormal   Collection Time: 08/20/16  9:03 PM  Result Value Ref Range   Glucose-Capillary 148 (H) 65 - 99 mg/dL  Glucose, capillary     Status: Abnormal   Collection Time: 08/21/16 12:40 AM  Result Value Ref Range   Glucose-Capillary 174 (H) 65 - 99 mg/dL   Comment 1 Notify RN    Comment 2 Document in Chart   Lipid panel     Status: None   Collection Time: 08/21/16  1:47 AM  Result Value Ref Range   Cholesterol 128 0 - 200 mg/dL   Triglycerides 56 <150 mg/dL   HDL 53 >40 mg/dL   Total CHOL/HDL Ratio 2.4 RATIO   VLDL 11 0 - 40 mg/dL   LDL Cholesterol 64 0 - 99 mg/dL  Glucose, capillary     Status: Abnormal   Collection Time: 08/21/16  4:09 AM  Result Value Ref Range   Glucose-Capillary 148 (H) 65 - 99 mg/dL   Comment 1 Notify RN    Comment 2 Document in Chart   Glucose, capillary     Status: Abnormal   Collection Time: 08/21/16  7:35 AM  Result Value Ref Range   Glucose-Capillary 144 (H) 65 - 99 mg/dL  Glucose, capillary     Status: Abnormal   Collection Time: 08/21/16 11:25 AM  Result Value Ref  Range   Glucose-Capillary 140 (H) 65 - 99 mg/dL   Ct Angio Head W Or Wo Contrast  Result Date:  08/20/2016 CLINICAL DATA:  Headache and vertigo EXAM: CT ANGIOGRAPHY HEAD AND NECK TECHNIQUE: Multidetector CT imaging of the head and neck was performed using the standard protocol during bolus administration of intravenous contrast. Multiplanar CT image reconstructions and MIPs were obtained to evaluate the vascular anatomy. Carotid stenosis measurements (when applicable) are obtained utilizing NASCET criteria, using the distal internal carotid diameter as the denominator. CONTRAST:  50 mL Isovue 370 IV COMPARISON:  Head CT 08/20/2016 FINDINGS: CT HEAD FINDINGS Brain: No mass lesion, intraparenchymal hemorrhage or extra-axial collection. No evidence of acute cortical infarct. Mild volume loss. There is periventricular hypoattenuation compatible with chronic microvascular disease. Vascular: No hyperdense vessel or unexpected calcification. Skull: Normal visualized skull base, calvarium and extracranial soft tissues. Sinuses/Orbits: No sinus fluid levels or advanced mucosal thickening. No mastoid effusion. Normal orbits. CTA NECK FINDINGS Aortic arch: There is no aneurysm or dissection of the visualized ascending aorta or aortic arch. There is a normal variant aortic arch branching pattern with the brachiocephalic and left common carotid arteries sharing a common origin. The visualized proximal subclavian arteries are normal. Mild aortic arch atherosclerotic calcification. Right carotid system: The right common carotid origin is widely patent. There is no common carotid or internal carotid artery dissection or aneurysm. There is mild atherosclerotic calcification at the carotid bifurcation without hemodynamically significant stenosis. Left carotid system: The left common carotid origin is widely patent. There is no common carotid or internal carotid artery dissection or aneurysm. No hemodynamically significant stenosis. Vertebral arteries: The vertebral system is left dominant. Both vertebral artery origins are  normal. There is progressive narrowing of the right vertebral artery, which becomes occluded at the C5 level. Minimal opacification of the distal right V4 segment likely secondary to retrograde flow. The left vertebral artery is normal. Skeleton: There is no bony spinal canal stenosis. No lytic or blastic lesions. Other neck: The nasopharynx is clear. The oropharynx and hypopharynx are normal. The epiglottis is normal. The supraglottic larynx, glottis and subglottic larynx are normal. No retropharyngeal collection. The parapharyngeal spaces are preserved. The parotid and submandibular glands are normal. No sialolithiasis or salivary ductal dilatation. There is a heterogeneous left thyroid nodule measuring 2 cm. There is no cervical lymphadenopathy. Upper chest: No pneumothorax or pleural effusion. No nodules or masses. Review of the MIP images confirms the above findings CTA HEAD FINDINGS Anterior circulation: --Intracranial internal carotid arteries: Normal. --Anterior cerebral arteries: Normal. --Middle cerebral arteries: Normal. --Posterior communicating arteries: Absent bilaterally. Posterior circulation: --Posterior cerebral arteries: Normal. --Superior cerebellar arteries: Normal. --Basilar artery: Normal. --Anterior inferior cerebellar arteries: Present on the left. Not clearly seen on the right. --Posterior inferior cerebellar arteries: Normal on the left. Not seen on the right. Venous sinuses: As permitted by contrast timing, patent. Anatomic variants: None Delayed phase: No parenchymal contrast enhancement. Review of the MIP images confirms the above findings IMPRESSION: 1. Tapering of the right vertebral artery, which becomes occluded at the level of C5. The appearance is concerning for dissection. 2. Non opacification of the right anterior and posterior inferior cerebellar arteries. Given the clinical context, the findings are compatible with posterior fossa ischemia. Further evaluation with MRI may be  helpful to evaluate for an acute cerebellar infarct. 3. 2 cm left thyroid nodule. Nonemergent follow-up with dedicated thyroid ultrasound is recommended. 4. Variant arch anatomy with the brachiocephalic and left common carotid arteries sharing a  common origin. No right subclavian stenosis. Wide patency of right vertebral artery origin. Critical Value/emergent results were called by telephone at the time of interpretation on 08/20/2016 at 3:08 pm to Dr. Charlesetta Shanks , who verbally acknowledged these results. Electronically Signed   By: Ulyses Jarred M.D.   On: 08/20/2016 15:16   Ct Angio Neck W And/or Wo Contrast  Result Date: 08/20/2016 CLINICAL DATA:  Headache and vertigo EXAM: CT ANGIOGRAPHY HEAD AND NECK TECHNIQUE: Multidetector CT imaging of the head and neck was performed using the standard protocol during bolus administration of intravenous contrast. Multiplanar CT image reconstructions and MIPs were obtained to evaluate the vascular anatomy. Carotid stenosis measurements (when applicable) are obtained utilizing NASCET criteria, using the distal internal carotid diameter as the denominator. CONTRAST:  50 mL Isovue 370 IV COMPARISON:  Head CT 08/20/2016 FINDINGS: CT HEAD FINDINGS Brain: No mass lesion, intraparenchymal hemorrhage or extra-axial collection. No evidence of acute cortical infarct. Mild volume loss. There is periventricular hypoattenuation compatible with chronic microvascular disease. Vascular: No hyperdense vessel or unexpected calcification. Skull: Normal visualized skull base, calvarium and extracranial soft tissues. Sinuses/Orbits: No sinus fluid levels or advanced mucosal thickening. No mastoid effusion. Normal orbits. CTA NECK FINDINGS Aortic arch: There is no aneurysm or dissection of the visualized ascending aorta or aortic arch. There is a normal variant aortic arch branching pattern with the brachiocephalic and left common carotid arteries sharing a common origin. The visualized  proximal subclavian arteries are normal. Mild aortic arch atherosclerotic calcification. Right carotid system: The right common carotid origin is widely patent. There is no common carotid or internal carotid artery dissection or aneurysm. There is mild atherosclerotic calcification at the carotid bifurcation without hemodynamically significant stenosis. Left carotid system: The left common carotid origin is widely patent. There is no common carotid or internal carotid artery dissection or aneurysm. No hemodynamically significant stenosis. Vertebral arteries: The vertebral system is left dominant. Both vertebral artery origins are normal. There is progressive narrowing of the right vertebral artery, which becomes occluded at the C5 level. Minimal opacification of the distal right V4 segment likely secondary to retrograde flow. The left vertebral artery is normal. Skeleton: There is no bony spinal canal stenosis. No lytic or blastic lesions. Other neck: The nasopharynx is clear. The oropharynx and hypopharynx are normal. The epiglottis is normal. The supraglottic larynx, glottis and subglottic larynx are normal. No retropharyngeal collection. The parapharyngeal spaces are preserved. The parotid and submandibular glands are normal. No sialolithiasis or salivary ductal dilatation. There is a heterogeneous left thyroid nodule measuring 2 cm. There is no cervical lymphadenopathy. Upper chest: No pneumothorax or pleural effusion. No nodules or masses. Review of the MIP images confirms the above findings CTA HEAD FINDINGS Anterior circulation: --Intracranial internal carotid arteries: Normal. --Anterior cerebral arteries: Normal. --Middle cerebral arteries: Normal. --Posterior communicating arteries: Absent bilaterally. Posterior circulation: --Posterior cerebral arteries: Normal. --Superior cerebellar arteries: Normal. --Basilar artery: Normal. --Anterior inferior cerebellar arteries: Present on the left. Not clearly seen  on the right. --Posterior inferior cerebellar arteries: Normal on the left. Not seen on the right. Venous sinuses: As permitted by contrast timing, patent. Anatomic variants: None Delayed phase: No parenchymal contrast enhancement. Review of the MIP images confirms the above findings IMPRESSION: 1. Tapering of the right vertebral artery, which becomes occluded at the level of C5. The appearance is concerning for dissection. 2. Non opacification of the right anterior and posterior inferior cerebellar arteries. Given the clinical context, the findings are compatible with posterior fossa  ischemia. Further evaluation with MRI may be helpful to evaluate for an acute cerebellar infarct. 3. 2 cm left thyroid nodule. Nonemergent follow-up with dedicated thyroid ultrasound is recommended. 4. Variant arch anatomy with the brachiocephalic and left common carotid arteries sharing a common origin. No right subclavian stenosis. Wide patency of right vertebral artery origin. Critical Value/emergent results were called by telephone at the time of interpretation on 08/20/2016 at 3:08 pm to Dr. Charlesetta Shanks , who verbally acknowledged these results. Electronically Signed   By: Ulyses Jarred M.D.   On: 08/20/2016 15:16   Mr Jodene Nam Head Wo Contrast  Result Date: 08/20/2016 CLINICAL DATA:  Vertebral artery dissection.  Dizziness. EXAM: MRI HEAD WITHOUT CONTRAST MRA HEAD WITHOUT CONTRAST TECHNIQUE: Multiplanar, multiecho pulse sequences of the brain and surrounding structures were obtained without intravenous contrast. Angiographic images of the head were obtained using MRA technique without contrast. COMPARISON:  CTA head and neck 08/20/2016 FINDINGS: MRI HEAD FINDINGS Brain: There are multiple punctate foci of diffusion restriction within the right cerebellar hemisphere, within the right PICA territory. No other areas of diffusion restriction. No evidence of hemorrhage. There is an old left cerebellar infarct. The brain parenchymal  signal is normal. No mass lesion or midline shift. No hydrocephalus or extra-axial fluid collection. The midline structures are normal. No age advanced or lobar predominant atrophy. Vascular: Major intracranial arterial and venous sinus flow voids are preserved. No evidence of chronic microhemorrhage or amyloid angiopathy. Skull and upper cervical spine: The visualized skull base, calvarium, upper cervical spine and extracranial soft tissues are normal. Sinuses/Orbits: No fluid levels or advanced mucosal thickening. No mastoid effusion. Normal orbits. MRA HEAD FINDINGS Intracranial internal carotid arteries: Normal. Anterior cerebral arteries: Normal. Middle cerebral arteries: Normal. Posterior communicating arteries: Present bilaterally. Posterior cerebral arteries: Normal. Basilar artery: Normal. Vertebral arteries: Left dominant. There is no flow related enhancement seen within the proximal V4 segment of the right vertebral artery. Flow related enhancement within a short segment of the most distal right V4 segment is likely due to retrograde filling. Superior cerebellar arteries: Normal. Anterior inferior cerebellar arteries: Normal. Posterior inferior cerebellar arteries: No flow related enhancement of the right PICA. Left PICA is normal. IMPRESSION: 1. Multiple small foci of acute ischemia within the right cerebellar hemisphere, within the PICA territory. No hematoma or mass effect. 2. 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 3. Old left cerebellar infarct. Electronically Signed   By: Ulyses Jarred M.D.   On: 08/20/2016 20:41   Mr Brain Wo Contrast  Result Date: 08/20/2016 CLINICAL DATA:  Vertebral artery dissection.  Dizziness. EXAM: MRI HEAD WITHOUT CONTRAST MRA HEAD WITHOUT CONTRAST TECHNIQUE: Multiplanar, multiecho pulse sequences of the brain and surrounding structures were obtained without intravenous contrast. Angiographic images of the head  were obtained using MRA technique without contrast. COMPARISON:  CTA head and neck 08/20/2016 FINDINGS: MRI HEAD FINDINGS Brain: There are multiple punctate foci of diffusion restriction within the right cerebellar hemisphere, within the right PICA territory. No other areas of diffusion restriction. No evidence of hemorrhage. There is an old left cerebellar infarct. The brain parenchymal signal is normal. No mass lesion or midline shift. No hydrocephalus or extra-axial fluid collection. The midline structures are normal. No age advanced or lobar predominant atrophy. Vascular: Major intracranial arterial and venous sinus flow voids are preserved. No evidence of chronic microhemorrhage or amyloid angiopathy. Skull and upper cervical spine: The visualized skull base, calvarium, upper cervical spine and  extracranial soft tissues are normal. Sinuses/Orbits: No fluid levels or advanced mucosal thickening. No mastoid effusion. Normal orbits. MRA HEAD FINDINGS Intracranial internal carotid arteries: Normal. Anterior cerebral arteries: Normal. Middle cerebral arteries: Normal. Posterior communicating arteries: Present bilaterally. Posterior cerebral arteries: Normal. Basilar artery: Normal. Vertebral arteries: Left dominant. There is no flow related enhancement seen within the proximal V4 segment of the right vertebral artery. Flow related enhancement within a short segment of the most distal right V4 segment is likely due to retrograde filling. Superior cerebellar arteries: Normal. Anterior inferior cerebellar arteries: Normal. Posterior inferior cerebellar arteries: No flow related enhancement of the right PICA. Left PICA is normal. IMPRESSION: 1. Multiple small foci of acute ischemia within the right cerebellar hemisphere, within the PICA territory. No hematoma or mass effect. 2. 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 3. Old left cerebellar infarct.  Electronically Signed   By: Ulyses Jarred M.D.   On: 08/20/2016 20:41   Ct Head Code Stroke W/o Cm  Result Date: 08/20/2016 CLINICAL DATA:  Code stroke. Severe dizziness with RIGHT-sided headache beginning earlier today. EXAM: CT HEAD WITHOUT CONTRAST TECHNIQUE: Contiguous axial images were obtained from the base of the skull through the vertex without intravenous contrast. COMPARISON:  12/19/2014. FINDINGS: Brain: No evidence for acute infarction, hemorrhage, mass lesion, hydrocephalus, or extra-axial fluid. Mild atrophy. Slight hypoattenuation of white matter, favored to represent chronic microvascular ischemic change. Vascular: Mild vascular calcification in the distal vertebral arteries and carotid siphon segments. No signs of large vessel occlusion. Skull: Normal. Negative for fracture or focal lesion. Sinuses/Orbits: No acute finding. Other: None. ASPECTS [facility] Stroke Program Early CT Score) - Ganglionic level infarction (caudate, lentiform nuclei, internal capsule, insula, M1-M3 cortex): 7 - Supraganglionic infarction (M4-M6 cortex): 3 Total score (0-10 with 10 being normal): 10 IMPRESSION: 1. Atrophy and small vessel disease. No acute intracranial findings. No findings suggestive of large vessel occlusion. 2. ASPECTS is 10 These results were called by telephone at the time of interpretation on 08/20/2016 at 12:15 pm to Dr. Leonel Ramsay , who verbally acknowledged these results. Electronically Signed   By: Staci Righter M.D.   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 Does the need for close, 24 hr/day medical supervision in concert with the patient's rehab needs make it unreasonable for this patient to be served in a less intensive setting? Yes 2. Co-Morbidities requiring supervision/potential complications: History of myocardial infarction and coronary artery disease, history of morbid obesity, status post lap  band 3. Due to bladder management, bowel management, safety, skin/wound care, disease management, medication administration, pain management and patient education, does the patient require 24 hr/day rehab nursing? Yes 4. Does the patient require coordinated care of a physician, rehab nurse, PT (1-2 hrs/day, 5 days/week) and OT (1-2 hrs/day, 5 days/week) to address physical and functional deficits in the context of the above medical diagnosis(es)? Yes Addressing deficits in the following areas: balance, endurance, locomotion, strength, transferring, bowel/bladder control, bathing, dressing, feeding, grooming, toileting, cognition and psychosocial support 5. Can the patient actively participate in an intensive therapy program of at least 3 hrs of therapy per day at least 5 days per week? Yes 6. The potential for patient to make measurable gains while on inpatient rehab is good 7. Anticipated functional outcomes upon discharge from inpatient rehab are modified independent and supervision  with PT, modified independent and supervision with OT, n/a  with SLP. 8. Estimated rehab length of stay to reach the above functional goals is: 10-14 days 9. Does the patient have adequate social supports and living environment to accommodate these discharge functional goals? Yes 10. Anticipated D/C setting: Home 11. Anticipated post D/C treatments: [place] therapy 12. Overall Rehab/Functional Prognosis: excellent  RECOMMENDATIONS: This patient's condition is appropriate for continued rehabilitative care in the following setting: CIR Patient has agreed to participate in recommended program. Yes Note that insurance prior authorization may be required for reimbursement for recommended care.  CommentCathlyn Parsons., PA-C 08/21/2016

## 2016-08-21 NOTE — Progress Notes (Signed)
Inpatient Rehabilitation  PT has evaluated pt. And is recommending IP rehab.  Patient was screened by Gerlean Ren for appropriateness for an Inpatient Acute Rehab consult.  At this time, we are recommending Inpatient Rehab consult.  Please order consult if you are agreeable.  [place] Admissions Coordinator Cell [phone] Office [phone]

## 2016-08-21 NOTE — Progress Notes (Signed)
Progress Note  Patient Name: Elaine Jackson Date of Encounter: 08/21/2016  Primary Cardiologist: Dr Marlou Porch  Subjective   Her main compliant this am is headache-"I feel terrible"  Inpatient Medications    Scheduled Meds: .  stroke: mapping our early stages of recovery book   Does not apply Once  . aspirin EC  325 mg Oral Daily  . atorvastatin  80 mg Oral q1800  . cholecalciferol  2,000 Units Oral Daily  . clopidogrel  300 mg Oral Once  . clopidogrel  75 mg Oral Daily  . enoxaparin (LOVENOX) injection  40 mg Subcutaneous Q24H  . insulin aspart  0-9 Units Subcutaneous Q4H  . montelukast  10 mg Oral QHS  .  morphine injection  4 mg Intravenous Once   Continuous Infusions:  PRN Meds: acetaminophen **OR** acetaminophen (TYLENOL) oral liquid 160 mg/5 mL **OR** acetaminophen, hydrALAZINE   Vital Signs    Vitals:   08/20/16 2108 08/20/16 2310 08/21/16 0100 08/21/16 0415  BP: (!) 96/45 (!) 99/56 (!) 104/39 (!) 102/33  Pulse: (!) 56 (!) 58 60 64  Resp: 18 17 18 18   Temp: 97.7 F (36.5 C) 98.1 F (36.7 C) 97.9 F (36.6 C) 98.1 F (36.7 C)  TempSrc: Oral Oral Oral Oral  SpO2: 100% 100% 92% 100%    Intake/Output Summary (Last 24 hours) at 08/21/16 [id number] Last data filed at 08/21/16 0400  Gross per 24 hour  Intake              240 ml  Output              100 ml  Net              140 ml   There were no vitals filed for this visit.  Telemetry     NSR, SB, HR as low as 40 last PM-not sustained more than 5 minutes- Personally Reviewed  ECG    NSR, SB - Personally Reviewed  Physical Exam   GEN: Uncomfortable No acute distress.   Neck: No JVD Cardiac: RRR, no murmurs, rubs, or gallops.  Respiratory: Clear to auscultation bilaterally. GI: Soft, nontender, non-distended  MS: No edema; No deformity. Neuro:  Nonfocal  Psych: Normal affect   Labs    Chemistry Recent Labs Lab 08/20/16 1127 08/20/16 1155  NA 137 139  K 4.4 4.4  CL 102 102  CO2 22  --   GLUCOSE  177* 181*  BUN 26* 35*  CREATININE 1.13* 1.20*  CALCIUM 9.8  --   GFRNONAA 51*  --   GFRAA 60*  --   ANIONGAP 13  --      Hematology Recent Labs Lab 08/20/16 1127 08/20/16 1155  WBC 9.0  --   RBC 4.04  --   HGB 12.2 12.6  HCT 37.8 37.0  MCV 93.6  --   MCH 30.2  --   MCHC 32.3  --   RDW 13.5  --   PLT 243  --     Cardiac EnzymesNo results for input(s): TROPONINI in the last 168 hours.  Recent Labs Lab 08/20/16 1139  TROPIPOC 0.01      Radiology    Ct Angio Head W Or Wo Contrast  Result Date: 08/20/2016 CLINICAL DATA:  Headache and vertigo EXAM: CT ANGIOGRAPHY HEAD AND NECK   IMPRESSION: 1. Tapering of the right vertebral artery, which becomes occluded at the level of C5. The appearance is concerning for dissection. 2. Non opacification of the right  anterior and posterior inferior cerebellar arteries. Given the clinical context, the findings are compatible with posterior fossa ischemia. Further evaluation with MRI may be helpful to evaluate for an acute cerebellar infarct. 3. 2 cm left thyroid nodule. Nonemergent follow-up with dedicated thyroid ultrasound is recommended. 4. Variant arch anatomy with the brachiocephalic and left common carotid arteries sharing a common origin. No right subclavian stenosis. Wide patency of right vertebral artery origin. Critical Value/emergent results were called by telephone at the time of interpretation on 08/20/2016 at 3:08 pm to Dr. Charlesetta Shanks , who verbally acknowledged these results. Electronically Signed   By: Ulyses Jarred M.D.   On: 08/20/2016 15:16    MRI Brain Wo Contrast   IMPRESSION: 1.  No hematoma or mass effect. 2. 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 3. Old left cerebellar infarct. Electronically Signed   By: Ulyses Jarred M.D.   On: 08/20/2016 20:41   Ct Head Code Stroke W/o Cm  Result Date: 08/20/2016 CLINICAL DATA:  Code stroke. Severe dizziness with  RIGHT-sided headache beginning earlier today. EXAM: CT HEAD WITHOUT CONTRAST TECHNIQUE:  10 IMPRESSION: 1. Atrophy and small vessel disease. No acute intracranial findings. No findings suggestive of large vessel occlusion. 2. ASPECTS is 10 These results were called by telephone at the time of interpretation on 08/20/2016 at 12:15 pm to Dr. Leonel Ramsay , who verbally acknowledged these results. Electronically Signed   By: Staci Righter M.D.   On: 08/20/2016 12:16    Cardiac Studies   Echo pending- echo in 2016-Normal LVF  Patient Profile     61 y.o. female 61 y.o. female with a history of DMT2, HTN, HLD, obesity s/p lap banding, CAD s/p apical MI (11/2014), normal LVF by echo June 2016, and prior sinus bradycardia who presented to [facility] on 08/20/16 for evaluation of dizziness, slurred speech and HA. MRI shows multiple small foci of acute ischemia within the right cerebellar hemisphere, within the PICA territory. CTA neck shows tapering of the right vertebral artery, which becomes occluded at the level of C5. The appearance is concerning for dissection. Cardiology consulted due to bradycardia.   Assessment & Plan    Sinus bradycardia: she has a long history of this. She is on no AVN blocking agents. Her lowest HR recorded was ~ around 40. Per review of RN reports, HRs went into high 30s. Discussed with neuro PA and worsening of her chronic bradycardia may be due to compression of her vagus nerve 2/2 vertebral dissection.  No indication for PPM at this time.   Dizziness/slurred speech: CTA showed a vertebral dissection with non opacification of the right anterior and posterior inferior cerebellar arteries. Given the clinical context, the findings are compatible with posterior fossa ischemia. MRI brain shows an old stroke as well as multiple ischemic focci in Rt brain.   Vertebral dissection: this could explain her sx. Plan is for ASA/ plavix  HTN: BP well controlled currently.   CAD:  Troponin neg  x1. ECG with no acute ST or TW changes. No recent angina.  HLD: continue statin -LDL 64  Plan: Will review MRI findings with MD. She told me her HR was "107" on Sunday and she didn't feel well. She has an old CVA on MRI- ? If she has PAF with SSS. Echo ordered for today. She is currently on ASA and Plavix.   Signed, Kerin Ransom, PA-C  08/21/2016, 8:14 AM     Patient  seen and examined. Agree with assessment and plan. MRI shows occlusion of the visualized portion of the right vertebral artery and occlusion of the right PICA as well as probableld left cerebellar infarct. No chest pain. C/o headache. Telemetry NSR at 60 without ectopy.  Pt reports an increased HR ~ 110 and increased BP awakening her from sleep this past Sunday. No history of prior known PAF or awareness of OSA but may need future evaluation.  With CAD, would not dc ASA after 3 months of DAPT and continue DAPT thereafter with low dose ASA 81 mg. For echo today.   Troy Sine, MD, [facility] 08/21/2016 11:46 AM

## 2016-08-21 NOTE — Progress Notes (Signed)
PROGRESS NOTE    Elaine Jackson  [id number] DOB: 20-Dec-1955 DOA: 08/20/2016 PCP: Reginia Forts, MD   Brief Narrative:  Pt has no new complaints. No acute issues overnight.  Assessment & Plan:   Principal Problem:   Vertebral artery dissection/ +/- acute CVA -Patient presents with dizziness plus neck and head pain with CTA demonstrating right vertebral artery dissection and possible associated ischemic changes in the posterior fossa -MRI/MRA brain resulted - Neurology on board and to make further recommendations.  Active Problems:   Acute kidney injury  -Baseline renal function: 22/0.91 -Current renal function: 35/1.2    Left thyroid nodule - Incidental finding on CTA neck - TSH within normal limits    Bradycardia -Evaluated by cardiology in the ER; bradycardia determined to be chronic in nature and asymptomatic    Abnormal urinalysis -Asymptomatic -Check urine culture    Diabetes mellitus type II, controlled  -Diet controlled at home -Follow CBGs -SSI -HgbA1c as above    Essential hypertension, benign -Allowing for permissive hypertension (see above)    Coronary artery disease involving native coronary artery of native heart without angina pectoris -Asymptomatic -On statin and aspirin prior to admission -No beta blocker secondary to bradycardia    Hx of laparoscopic gastric banding    Dyslipidemia -Continue statin  DVT prophylaxis: Lovenox Code Status: Full Family Communication: d/c with family member at bedside Disposition Plan: pending improvement in condition   Consultants:   Cardiology  Neurology   Procedures: none   Antimicrobials: none   Subjective: Pt had no new complaints no acute issues overnight.  Objective: Vitals:   08/21/16 0100 08/21/16 0415 08/21/16 0900 08/21/16 1416  BP: (!) 104/39 (!) 102/33 (!) 111/48 (!) 120/50  Pulse: 60 64 (!) 57 (!) 56  Resp: 18 18 19 20   Temp: 97.9 F (36.6 C) 98.1 F (36.7 C) 98.2 F  (36.8 C) 98.4 F (36.9 C)  TempSrc: Oral Oral Oral Oral  SpO2: 92% 100% 100% 100%    Intake/Output Summary (Last 24 hours) at 08/21/16 1529 Last data filed at 08/21/16 0400  Gross per 24 hour  Intake              240 ml  Output              100 ml  Net              140 ml   There were no vitals filed for this visit.  Examination:  General exam: Appears calm and comfortable, in nad. Respiratory system: Clear to auscultation. Respiratory effort normal. Cardiovascular system: S1 & S2 heard, RRR. No JVD, murmurs, rubs, gallops or clicks. No pedal edema. Gastrointestinal system: Abdomen is nondistended, soft and nontender. No organomegaly or masses felt. Normal bowel sounds heard. Central nervous system: Alert and oriented. No facial asymmetry Extremities: no cyanosis Skin: No rashes, lesions or ulcers on limited exam. Psychiatry: Mood & affect appropriate.   Data Reviewed: I have personally reviewed following labs and imaging studies  CBC:  Recent Labs Lab 08/20/16 1127 08/20/16 1155  WBC 9.0  --   NEUTROABS 5.1  --   HGB 12.2 12.6  HCT 37.8 37.0  MCV 93.6  --   PLT 243  --    Basic Metabolic Panel:  Recent Labs Lab 08/20/16 1127 08/20/16 1155  NA 137 139  K 4.4 4.4  CL 102 102  CO2 22  --   GLUCOSE 177* 181*  BUN 26* 35*  CREATININE 1.13* 1.20*  CALCIUM  9.8  --    GFR: CrCl cannot be calculated (Unknown ideal weight.). Liver Function Tests: No results for input(s): AST, ALT, ALKPHOS, BILITOT, PROT, ALBUMIN in the last 168 hours. No results for input(s): LIPASE, AMYLASE in the last 168 hours. No results for input(s): AMMONIA in the last 168 hours. Coagulation Profile:  Recent Labs Lab 08/20/16 1127  INR 0.97   Cardiac Enzymes: No results for input(s): CKTOTAL, CKMB, CKMBINDEX, TROPONINI in the last 168 hours. BNP (last 3 results) No results for input(s): PROBNP in the last 8760 hours. HbA1C: No results for input(s): HGBA1C in the last 72  hours. CBG:  Recent Labs Lab 08/20/16 2103 08/21/16 0040 08/21/16 0409 08/21/16 0735 08/21/16 1125  GLUCAP 148* 174* 148* 144* 140*   Lipid Profile:  Recent Labs  08/21/16 0147  CHOL 128  HDL 53  LDLCALC 64  TRIG 56  CHOLHDL 2.4   Thyroid Function Tests:  Recent Labs  08/20/16 1853  TSH 0.564  FREET4 1.09   Anemia Panel: No results for input(s): VITAMINB12, FOLATE, FERRITIN, TIBC, IRON, RETICCTPCT in the last 72 hours. Sepsis Labs: No results for input(s): PROCALCITON, LATICACIDVEN in the last 168 hours.  No results found for this or any previous visit (from the past 240 hour(s)).    Radiology Studies: Ct Angio Head W Or Wo Contrast  Result Date: 08/20/2016 CLINICAL DATA:  Headache and vertigo EXAM: CT ANGIOGRAPHY HEAD AND NECK TECHNIQUE: Multidetector CT imaging of the head and neck was performed using the standard protocol during bolus administration of intravenous contrast. Multiplanar CT image reconstructions and MIPs were obtained to evaluate the vascular anatomy. Carotid stenosis measurements (when applicable) are obtained utilizing NASCET criteria, using the distal internal carotid diameter as the denominator. CONTRAST:  50 mL Isovue 370 IV COMPARISON:  Head CT 08/20/2016 FINDINGS: CT HEAD FINDINGS Brain: No mass lesion, intraparenchymal hemorrhage or extra-axial collection. No evidence of acute cortical infarct. Mild volume loss. There is periventricular hypoattenuation compatible with chronic microvascular disease. Vascular: No hyperdense vessel or unexpected calcification. Skull: Normal visualized skull base, calvarium and extracranial soft tissues. Sinuses/Orbits: No sinus fluid levels or advanced mucosal thickening. No mastoid effusion. Normal orbits. CTA NECK FINDINGS Aortic arch: There is no aneurysm or dissection of the visualized ascending aorta or aortic arch. There is a normal variant aortic arch branching pattern with the brachiocephalic and left common  carotid arteries sharing a common origin. The visualized proximal subclavian arteries are normal. Mild aortic arch atherosclerotic calcification. Right carotid system: The right common carotid origin is widely patent. There is no common carotid or internal carotid artery dissection or aneurysm. There is mild atherosclerotic calcification at the carotid bifurcation without hemodynamically significant stenosis. Left carotid system: The left common carotid origin is widely patent. There is no common carotid or internal carotid artery dissection or aneurysm. No hemodynamically significant stenosis. Vertebral arteries: The vertebral system is left dominant. Both vertebral artery origins are normal. There is progressive narrowing of the right vertebral artery, which becomes occluded at the C5 level. Minimal opacification of the distal right V4 segment likely secondary to retrograde flow. The left vertebral artery is normal. Skeleton: There is no bony spinal canal stenosis. No lytic or blastic lesions. Other neck: The nasopharynx is clear. The oropharynx and hypopharynx are normal. The epiglottis is normal. The supraglottic larynx, glottis and subglottic larynx are normal. No retropharyngeal collection. The parapharyngeal spaces are preserved. The parotid and submandibular glands are normal. No sialolithiasis or salivary ductal dilatation. There is a  heterogeneous left thyroid nodule measuring 2 cm. There is no cervical lymphadenopathy. Upper chest: No pneumothorax or pleural effusion. No nodules or masses. Review of the MIP images confirms the above findings CTA HEAD FINDINGS Anterior circulation: --Intracranial internal carotid arteries: Normal. --Anterior cerebral arteries: Normal. --Middle cerebral arteries: Normal. --Posterior communicating arteries: Absent bilaterally. Posterior circulation: --Posterior cerebral arteries: Normal. --Superior cerebellar arteries: Normal. --Basilar artery: Normal. --Anterior inferior  cerebellar arteries: Present on the left. Not clearly seen on the right. --Posterior inferior cerebellar arteries: Normal on the left. Not seen on the right. Venous sinuses: As permitted by contrast timing, patent. Anatomic variants: None Delayed phase: No parenchymal contrast enhancement. Review of the MIP images confirms the above findings IMPRESSION: 1. Tapering of the right vertebral artery, which becomes occluded at the level of C5. The appearance is concerning for dissection. 2. Non opacification of the right anterior and posterior inferior cerebellar arteries. Given the clinical context, the findings are compatible with posterior fossa ischemia. Further evaluation with MRI may be helpful to evaluate for an acute cerebellar infarct. 3. 2 cm left thyroid nodule. Nonemergent follow-up with dedicated thyroid ultrasound is recommended. 4. Variant arch anatomy with the brachiocephalic and left common carotid arteries sharing a common origin. No right subclavian stenosis. Wide patency of right vertebral artery origin. Critical Value/emergent results were called by telephone at the time of interpretation on 08/20/2016 at 3:08 pm to Dr. Charlesetta Shanks , who verbally acknowledged these results. Electronically Signed   By: Ulyses Jarred M.D.   On: 08/20/2016 15:16   Ct Angio Neck W And/or Wo Contrast  Result Date: 08/20/2016 CLINICAL DATA:  Headache and vertigo EXAM: CT ANGIOGRAPHY HEAD AND NECK TECHNIQUE: Multidetector CT imaging of the head and neck was performed using the standard protocol during bolus administration of intravenous contrast. Multiplanar CT image reconstructions and MIPs were obtained to evaluate the vascular anatomy. Carotid stenosis measurements (when applicable) are obtained utilizing NASCET criteria, using the distal internal carotid diameter as the denominator. CONTRAST:  50 mL Isovue 370 IV COMPARISON:  Head CT 08/20/2016 FINDINGS: CT HEAD FINDINGS Brain: No mass lesion, intraparenchymal  hemorrhage or extra-axial collection. No evidence of acute cortical infarct. Mild volume loss. There is periventricular hypoattenuation compatible with chronic microvascular disease. Vascular: No hyperdense vessel or unexpected calcification. Skull: Normal visualized skull base, calvarium and extracranial soft tissues. Sinuses/Orbits: No sinus fluid levels or advanced mucosal thickening. No mastoid effusion. Normal orbits. CTA NECK FINDINGS Aortic arch: There is no aneurysm or dissection of the visualized ascending aorta or aortic arch. There is a normal variant aortic arch branching pattern with the brachiocephalic and left common carotid arteries sharing a common origin. The visualized proximal subclavian arteries are normal. Mild aortic arch atherosclerotic calcification. Right carotid system: The right common carotid origin is widely patent. There is no common carotid or internal carotid artery dissection or aneurysm. There is mild atherosclerotic calcification at the carotid bifurcation without hemodynamically significant stenosis. Left carotid system: The left common carotid origin is widely patent. There is no common carotid or internal carotid artery dissection or aneurysm. No hemodynamically significant stenosis. Vertebral arteries: The vertebral system is left dominant. Both vertebral artery origins are normal. There is progressive narrowing of the right vertebral artery, which becomes occluded at the C5 level. Minimal opacification of the distal right V4 segment likely secondary to retrograde flow. The left vertebral artery is normal. Skeleton: There is no bony spinal canal stenosis. No lytic or blastic lesions. Other neck: The nasopharynx is clear.  The oropharynx and hypopharynx are normal. The epiglottis is normal. The supraglottic larynx, glottis and subglottic larynx are normal. No retropharyngeal collection. The parapharyngeal spaces are preserved. The parotid and submandibular glands are normal. No  sialolithiasis or salivary ductal dilatation. There is a heterogeneous left thyroid nodule measuring 2 cm. There is no cervical lymphadenopathy. Upper chest: No pneumothorax or pleural effusion. No nodules or masses. Review of the MIP images confirms the above findings CTA HEAD FINDINGS Anterior circulation: --Intracranial internal carotid arteries: Normal. --Anterior cerebral arteries: Normal. --Middle cerebral arteries: Normal. --Posterior communicating arteries: Absent bilaterally. Posterior circulation: --Posterior cerebral arteries: Normal. --Superior cerebellar arteries: Normal. --Basilar artery: Normal. --Anterior inferior cerebellar arteries: Present on the left. Not clearly seen on the right. --Posterior inferior cerebellar arteries: Normal on the left. Not seen on the right. Venous sinuses: As permitted by contrast timing, patent. Anatomic variants: None Delayed phase: No parenchymal contrast enhancement. Review of the MIP images confirms the above findings IMPRESSION: 1. Tapering of the right vertebral artery, which becomes occluded at the level of C5. The appearance is concerning for dissection. 2. Non opacification of the right anterior and posterior inferior cerebellar arteries. Given the clinical context, the findings are compatible with posterior fossa ischemia. Further evaluation with MRI may be helpful to evaluate for an acute cerebellar infarct. 3. 2 cm left thyroid nodule. Nonemergent follow-up with dedicated thyroid ultrasound is recommended. 4. Variant arch anatomy with the brachiocephalic and left common carotid arteries sharing a common origin. No right subclavian stenosis. Wide patency of right vertebral artery origin. Critical Value/emergent results were called by telephone at the time of interpretation on 08/20/2016 at 3:08 pm to Dr. Charlesetta Shanks , who verbally acknowledged these results. Electronically Signed   By: Ulyses Jarred M.D.   On: 08/20/2016 15:16   Mr Jodene Nam Head Wo  Contrast  Result Date: 08/20/2016 CLINICAL DATA:  Vertebral artery dissection.  Dizziness. EXAM: MRI HEAD WITHOUT CONTRAST MRA HEAD WITHOUT CONTRAST TECHNIQUE: Multiplanar, multiecho pulse sequences of the brain and surrounding structures were obtained without intravenous contrast. Angiographic images of the head were obtained using MRA technique without contrast. COMPARISON:  CTA head and neck 08/20/2016 FINDINGS: MRI HEAD FINDINGS Brain: There are multiple punctate foci of diffusion restriction within the right cerebellar hemisphere, within the right PICA territory. No other areas of diffusion restriction. No evidence of hemorrhage. There is an old left cerebellar infarct. The brain parenchymal signal is normal. No mass lesion or midline shift. No hydrocephalus or extra-axial fluid collection. The midline structures are normal. No age advanced or lobar predominant atrophy. Vascular: Major intracranial arterial and venous sinus flow voids are preserved. No evidence of chronic microhemorrhage or amyloid angiopathy. Skull and upper cervical spine: The visualized skull base, calvarium, upper cervical spine and extracranial soft tissues are normal. Sinuses/Orbits: No fluid levels or advanced mucosal thickening. No mastoid effusion. Normal orbits. MRA HEAD FINDINGS Intracranial internal carotid arteries: Normal. Anterior cerebral arteries: Normal. Middle cerebral arteries: Normal. Posterior communicating arteries: Present bilaterally. Posterior cerebral arteries: Normal. Basilar artery: Normal. Vertebral arteries: Left dominant. There is no flow related enhancement seen within the proximal V4 segment of the right vertebral artery. Flow related enhancement within a short segment of the most distal right V4 segment is likely due to retrograde filling. Superior cerebellar arteries: Normal. Anterior inferior cerebellar arteries: Normal. Posterior inferior cerebellar arteries: No flow related enhancement of the right PICA.  Left PICA is normal. IMPRESSION: 1. Multiple small foci of acute ischemia within the right cerebellar hemisphere,  within the PICA territory. No hematoma or mass effect. 2. 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 3. Old left cerebellar infarct. Electronically Signed   By: Ulyses Jarred M.D.   On: 08/20/2016 20:41   Mr Brain Wo Contrast  Result Date: 08/20/2016 CLINICAL DATA:  Vertebral artery dissection.  Dizziness. EXAM: MRI HEAD WITHOUT CONTRAST MRA HEAD WITHOUT CONTRAST TECHNIQUE: Multiplanar, multiecho pulse sequences of the brain and surrounding structures were obtained without intravenous contrast. Angiographic images of the head were obtained using MRA technique without contrast. COMPARISON:  CTA head and neck 08/20/2016 FINDINGS: MRI HEAD FINDINGS Brain: There are multiple punctate foci of diffusion restriction within the right cerebellar hemisphere, within the right PICA territory. No other areas of diffusion restriction. No evidence of hemorrhage. There is an old left cerebellar infarct. The brain parenchymal signal is normal. No mass lesion or midline shift. No hydrocephalus or extra-axial fluid collection. The midline structures are normal. No age advanced or lobar predominant atrophy. Vascular: Major intracranial arterial and venous sinus flow voids are preserved. No evidence of chronic microhemorrhage or amyloid angiopathy. Skull and upper cervical spine: The visualized skull base, calvarium, upper cervical spine and extracranial soft tissues are normal. Sinuses/Orbits: No fluid levels or advanced mucosal thickening. No mastoid effusion. Normal orbits. MRA HEAD FINDINGS Intracranial internal carotid arteries: Normal. Anterior cerebral arteries: Normal. Middle cerebral arteries: Normal. Posterior communicating arteries: Present bilaterally. Posterior cerebral arteries: Normal. Basilar artery: Normal. Vertebral arteries: Left dominant. There  is no flow related enhancement seen within the proximal V4 segment of the right vertebral artery. Flow related enhancement within a short segment of the most distal right V4 segment is likely due to retrograde filling. Superior cerebellar arteries: Normal. Anterior inferior cerebellar arteries: Normal. Posterior inferior cerebellar arteries: No flow related enhancement of the right PICA. Left PICA is normal. IMPRESSION: 1. Multiple small foci of acute ischemia within the right cerebellar hemisphere, within the PICA territory. No hematoma or mass effect. 2. 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 3. Old left cerebellar infarct. Electronically Signed   By: Ulyses Jarred M.D.   On: 08/20/2016 20:41   Ct Head Code Stroke W/o Cm  Result Date: 08/20/2016 CLINICAL DATA:  Code stroke. Severe dizziness with RIGHT-sided headache beginning earlier today. EXAM: CT HEAD WITHOUT CONTRAST TECHNIQUE: Contiguous axial images were obtained from the base of the skull through the vertex without intravenous contrast. COMPARISON:  12/19/2014. FINDINGS: Brain: No evidence for acute infarction, hemorrhage, mass lesion, hydrocephalus, or extra-axial fluid. Mild atrophy. Slight hypoattenuation of white matter, favored to represent chronic microvascular ischemic change. Vascular: Mild vascular calcification in the distal vertebral arteries and carotid siphon segments. No signs of large vessel occlusion. Skull: Normal. Negative for fracture or focal lesion. Sinuses/Orbits: No acute finding. Other: None. ASPECTS [facility] Stroke Program Early CT Score) - Ganglionic level infarction (caudate, lentiform nuclei, internal capsule, insula, M1-M3 cortex): 7 - Supraganglionic infarction (M4-M6 cortex): 3 Total score (0-10 with 10 being normal): 10 IMPRESSION: 1. Atrophy and small vessel disease. No acute intracranial findings. No findings suggestive of large vessel occlusion. 2. ASPECTS is  10 These results were called by telephone at the time of interpretation on 08/20/2016 at 12:15 pm to Dr. Leonel Ramsay , who verbally acknowledged these results. Electronically Signed   By: Staci Righter M.D.   On: 08/20/2016 12:16    Scheduled Meds: .  stroke: mapping our early  stages of recovery book   Does not apply Once  . aspirin EC  325 mg Oral Daily  . atorvastatin  80 mg Oral q1800  . cholecalciferol  2,000 Units Oral Daily  . clopidogrel  300 mg Oral Once  . clopidogrel  75 mg Oral Daily  . enoxaparin (LOVENOX) injection  40 mg Subcutaneous Q24H  . insulin aspart  0-9 Units Subcutaneous Q4H  . montelukast  10 mg Oral QHS  .  morphine injection  4 mg Intravenous Once   Continuous Infusions:   LOS: 0 days    Time spent: > 35 minutes  Velvet Bathe, MD [facility] Pager [phone]  If 7PM-7AM, please contact night-coverage [email or web address] Password [facility] 08/21/2016, 3:29 PM

## 2016-08-21 NOTE — Progress Notes (Signed)
Patient OOB with staff assistance to use bathroom, noted patient leans towards the right, gait is slow and unsteady. Patient became nauseated and vomited 100 ml of clear emesis.

## 2016-08-22 ENCOUNTER — Inpatient Hospital Stay (HOSPITAL_COMMUNITY): Payer: BLUE CROSS/BLUE SHIELD

## 2016-08-22 DIAGNOSIS — I6789 Other cerebrovascular disease: Secondary | ICD-10-CM

## 2016-08-22 LAB — ECHOCARDIOGRAM COMPLETE
AVLVOTPG: 8 mmHg
CHL CUP DOP CALC LVOT VTI: 33.7 cm
CHL CUP MV DEC (S): 275
E/e' ratio: 13.1
EWDT: 275 ms
FS: 46 % — AB (ref 28–44)
IVS/LV PW RATIO, ED: 1.03
LA ID, A-P, ES: 39 mm
LA diam index: 1.92 cm/m2
LA vol A4C: 63.3 ml
LA vol index: 31.4 mL/m2
LA vol: 63.9 mL
LDCA: 2.54 cm2
LEFT ATRIUM END SYS DIAM: 39 mm
LV E/e'average: 13.1
LV PW d: 10.3 mm — AB (ref 0.6–1.1)
LV TDI E'LATERAL: 8.7
LV TDI E'MEDIAL: 10.4
LVEEMED: 13.1
LVELAT: 8.7 cm/s
LVOT diameter: 18 mm
LVOTPV: 144 cm/s
LVOTSV: 86 mL
Lateral S' vel: 16.5 cm/s
MV Peak grad: 5 mmHg
MV pk A vel: 129 m/s
MV pk E vel: 114 m/s
P 1/2 time: 565 ms
TAPSE: 24.8 mm

## 2016-08-22 LAB — GLUCOSE, CAPILLARY
GLUCOSE-CAPILLARY: 109 mg/dL — AB (ref 65–99)
GLUCOSE-CAPILLARY: 118 mg/dL — AB (ref 65–99)
GLUCOSE-CAPILLARY: 99 mg/dL (ref 65–99)
Glucose-Capillary: 119 mg/dL — ABNORMAL HIGH (ref 65–99)
Glucose-Capillary: 131 mg/dL — ABNORMAL HIGH (ref 65–99)
Glucose-Capillary: 141 mg/dL — ABNORMAL HIGH (ref 65–99)
Glucose-Capillary: 182 mg/dL — ABNORMAL HIGH (ref 65–99)

## 2016-08-22 LAB — T3: T3 TOTAL: 90 ng/dL (ref 71–180)

## 2016-08-22 LAB — URINE CULTURE

## 2016-08-22 LAB — HEMOGLOBIN A1C
HEMOGLOBIN A1C: 5.8 % — AB (ref 4.8–5.6)
MEAN PLASMA GLUCOSE: 120 mg/dL

## 2016-08-22 NOTE — Progress Notes (Signed)
  Speech Language Pathology  Patient Details Name: Elaine Jackson MRN: [id number] DOB: [birth year] Today's Date: 08/22/2016 Time:  -      Pt screened. Pt without complaints of speech-language-cognitive needs during event or at present. No formal assessment warranted.   Orbie Pyo [place].[employer] [phone]

## 2016-08-22 NOTE — Progress Notes (Signed)
PROGRESS NOTE    Elaine Jackson  [id number] DOB: 06-03-1956 DOA: 08/20/2016 PCP: Reginia Forts, MD   Brief Narrative:  Pt has no new complaints. No acute issues overnight.  Assessment & Plan:   Principal Problem:   Vertebral artery dissection/ +/- acute CVA -Patient presents with dizziness plus neck and head pain with CTA demonstrating right vertebral artery dissection and possible associated ischemic changes in the posterior fossa -MRI/MRA brain resulted - Neurology on board and to make further recommendations.  Active Problems:   Acute kidney injury  -Baseline renal function: 22/0.91 -Current renal function: 35/1.2    Left thyroid nodule - Incidental finding on CTA neck - TSH within normal limits    Bradycardia -Evaluated by cardiology in the ER; bradycardia determined to be chronic in nature and asymptomatic - echocardiogram pending    Abnormal urinalysis -Asymptomatic -Check urine culture    Diabetes mellitus type II, controlled  -Diet controlled at home -Follow CBGs -SSI -HgbA1c as above    Essential hypertension, benign -Allowing for permissive hypertension (see above)    Coronary artery disease involving native coronary artery of native heart without angina pectoris -Asymptomatic -On statin and aspirin prior to admission -No beta blocker secondary to bradycardia    Hx of laparoscopic gastric banding    Dyslipidemia -Continue statin  DVT prophylaxis: Lovenox Code Status: Full Family Communication: d/c with family member at bedside Disposition Plan: Once cleared for d/c by cardiology   Consultants:   Cardiology  Neurology   Procedures: none   Antimicrobials: none   Subjective: Pt had no new complaints.  No acute issues overnight.  Objective: Vitals:   08/22/16 0030 08/22/16 0618 08/22/16 1000 08/22/16 1437  BP: 123/62 (!) 126/53 (!) 111/53 111/60  Pulse: 61 (!) 57 61 61  Resp: 18 18 18 18   Temp: 98.4 F (36.9 C) 98.5 F  (36.9 C) 98.4 F (36.9 C) 99.1 F (37.3 C)  TempSrc: Oral Oral Oral Oral  SpO2: 100% 96% 95% 100%    Intake/Output Summary (Last 24 hours) at 08/22/16 1730 Last data filed at 08/22/16 0700  Gross per 24 hour  Intake              620 ml  Output                0 ml  Net              620 ml   There were no vitals filed for this visit.  Examination:  General exam: Appears calm and comfortable, in nad. Respiratory system: Clear to auscultation. Respiratory effort normal. Cardiovascular system: S1 & S2 heard, RRR. No JVD, murmurs, rubs, gallops or clicks. No pedal edema. Gastrointestinal system: Abdomen is nondistended, soft and nontender. No organomegaly or masses felt. Normal bowel sounds heard. Central nervous system: Alert and oriented. No facial asymmetry Extremities: no cyanosis Skin: No rashes, lesions or ulcers on limited exam. Psychiatry: Mood & affect appropriate.   Data Reviewed: I have personally reviewed following labs and imaging studies  CBC:  Recent Labs Lab 08/20/16 1127 08/20/16 1155  WBC 9.0  --   NEUTROABS 5.1  --   HGB 12.2 12.6  HCT 37.8 37.0  MCV 93.6  --   PLT 243  --    Basic Metabolic Panel:  Recent Labs Lab 08/20/16 1127 08/20/16 1155  NA 137 139  K 4.4 4.4  CL 102 102  CO2 22  --   GLUCOSE 177* 181*  BUN 26* 35*  CREATININE 1.13* 1.20*  CALCIUM 9.8  --    GFR: CrCl cannot be calculated (Unknown ideal weight.). Liver Function Tests: No results for input(s): AST, ALT, ALKPHOS, BILITOT, PROT, ALBUMIN in the last 168 hours. No results for input(s): LIPASE, AMYLASE in the last 168 hours. No results for input(s): AMMONIA in the last 168 hours. Coagulation Profile:  Recent Labs Lab 08/20/16 1127  INR 0.97   Cardiac Enzymes: No results for input(s): CKTOTAL, CKMB, CKMBINDEX, TROPONINI in the last 168 hours. BNP (last 3 results) No results for input(s): PROBNP in the last 8760 hours. HbA1C:  Recent Labs  08/21/16 0147    HGBA1C 5.8*   CBG:  Recent Labs Lab 08/22/16 0016 08/22/16 0415 08/22/16 0854 08/22/16 1146 08/22/16 1649  GLUCAP 141* 119* 182* 109* 99   Lipid Profile:  Recent Labs  08/21/16 0147  CHOL 128  HDL 53  LDLCALC 64  TRIG 56  CHOLHDL 2.4   Thyroid Function Tests:  Recent Labs  08/20/16 1853  TSH 0.564  FREET4 1.09   Anemia Panel: No results for input(s): VITAMINB12, FOLATE, FERRITIN, TIBC, IRON, RETICCTPCT in the last 72 hours. Sepsis Labs: No results for input(s): PROCALCITON, LATICACIDVEN in the last 168 hours.  Recent Results (from the past 240 hour(s))  Culture, Urine     Status: Abnormal   Collection Time: 08/20/16 12:25 PM  Result Value Ref Range Status   Specimen Description URINE, CLEAN CATCH  Final   Special Requests NONE  Final   Culture MULTIPLE SPECIES PRESENT, SUGGEST RECOLLECTION (A)  Final   Report Status 08/22/2016 FINAL  Final      Radiology Studies: Mr Virgel Paling [id number] Contrast  Result Date: 08/20/2016 CLINICAL DATA:  Vertebral artery dissection.  Dizziness. EXAM: MRI HEAD WITHOUT CONTRAST MRA HEAD WITHOUT CONTRAST TECHNIQUE: Multiplanar, multiecho pulse sequences of the brain and surrounding structures were obtained without intravenous contrast. Angiographic images of the head were obtained using MRA technique without contrast. COMPARISON:  CTA head and neck 08/20/2016 FINDINGS: MRI HEAD FINDINGS Brain: There are multiple punctate foci of diffusion restriction within the right cerebellar hemisphere, within the right PICA territory. No other areas of diffusion restriction. No evidence of hemorrhage. There is an old left cerebellar infarct. The brain parenchymal signal is normal. No mass lesion or midline shift. No hydrocephalus or extra-axial fluid collection. The midline structures are normal. No age advanced or lobar predominant atrophy. Vascular: Major intracranial arterial and venous sinus flow voids are preserved. No evidence of chronic  microhemorrhage or amyloid angiopathy. Skull and upper cervical spine: The visualized skull base, calvarium, upper cervical spine and extracranial soft tissues are normal. Sinuses/Orbits: No fluid levels or advanced mucosal thickening. No mastoid effusion. Normal orbits. MRA HEAD FINDINGS Intracranial internal carotid arteries: Normal. Anterior cerebral arteries: Normal. Middle cerebral arteries: Normal. Posterior communicating arteries: Present bilaterally. Posterior cerebral arteries: Normal. Basilar artery: Normal. Vertebral arteries: Left dominant. There is no flow related enhancement seen within the proximal V4 segment of the right vertebral artery. Flow related enhancement within a short segment of the most distal right V4 segment is likely due to retrograde filling. Superior cerebellar arteries: Normal. Anterior inferior cerebellar arteries: Normal. Posterior inferior cerebellar arteries: No flow related enhancement of the right PICA. Left PICA is normal. IMPRESSION: 1. Multiple small foci of acute ischemia within the right cerebellar hemisphere, within the PICA territory. No hematoma or mass effect. 2. 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 3. Old  left cerebellar infarct. Electronically Signed   By: Ulyses Jarred M.D.   On: 08/20/2016 20:41   Mr Brain Wo Contrast  Result Date: 08/20/2016 CLINICAL DATA:  Vertebral artery dissection.  Dizziness. EXAM: MRI HEAD WITHOUT CONTRAST MRA HEAD WITHOUT CONTRAST TECHNIQUE: Multiplanar, multiecho pulse sequences of the brain and surrounding structures were obtained without intravenous contrast. Angiographic images of the head were obtained using MRA technique without contrast. COMPARISON:  CTA head and neck 08/20/2016 FINDINGS: MRI HEAD FINDINGS Brain: There are multiple punctate foci of diffusion restriction within the right cerebellar hemisphere, within the right PICA territory. No other areas of diffusion  restriction. No evidence of hemorrhage. There is an old left cerebellar infarct. The brain parenchymal signal is normal. No mass lesion or midline shift. No hydrocephalus or extra-axial fluid collection. The midline structures are normal. No age advanced or lobar predominant atrophy. Vascular: Major intracranial arterial and venous sinus flow voids are preserved. No evidence of chronic microhemorrhage or amyloid angiopathy. Skull and upper cervical spine: The visualized skull base, calvarium, upper cervical spine and extracranial soft tissues are normal. Sinuses/Orbits: No fluid levels or advanced mucosal thickening. No mastoid effusion. Normal orbits. MRA HEAD FINDINGS Intracranial internal carotid arteries: Normal. Anterior cerebral arteries: Normal. Middle cerebral arteries: Normal. Posterior communicating arteries: Present bilaterally. Posterior cerebral arteries: Normal. Basilar artery: Normal. Vertebral arteries: Left dominant. There is no flow related enhancement seen within the proximal V4 segment of the right vertebral artery. Flow related enhancement within a short segment of the most distal right V4 segment is likely due to retrograde filling. Superior cerebellar arteries: Normal. Anterior inferior cerebellar arteries: Normal. Posterior inferior cerebellar arteries: No flow related enhancement of the right PICA. Left PICA is normal. IMPRESSION: 1. Multiple small foci of acute ischemia within the right cerebellar hemisphere, within the PICA territory. No hematoma or mass effect. 2. 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 3. Old left cerebellar infarct. Electronically Signed   By: Ulyses Jarred M.D.   On: 08/20/2016 20:41    Scheduled Meds: .  stroke: mapping our early stages of recovery book   Does not apply Once  . aspirin EC  325 mg Oral Daily  . atorvastatin  80 mg Oral q1800  . cholecalciferol  2,000 Units Oral Daily  . clopidogrel  300  mg Oral Once  . clopidogrel  75 mg Oral Daily  . enoxaparin (LOVENOX) injection  40 mg Subcutaneous Q24H  . insulin aspart  0-9 Units Subcutaneous Q4H  . montelukast  10 mg Oral QHS  .  morphine injection  4 mg Intravenous Once   Continuous Infusions:   LOS: 1 day    Time spent: > 35 minutes  Velvet Bathe, MD [facility] Pager [phone]  If 7PM-7AM, please contact night-coverage [email or web address] Password [email or web address] 08/22/2016, 5:30 PM

## 2016-08-22 NOTE — Telephone Encounter (Signed)
Spoke with the patient. She remains in hospital, following vertebral artery dissection. She will schedule hospital follow-up upon discharge.

## 2016-08-22 NOTE — Progress Notes (Signed)
Inpatient Rehabilitation  Attempted to meet with patient; however, she was at a procedure.  Met with her spouse, sister, and son to discuss team's recommendation for IP Rehab.  Shared booklets and answered questions.  They stated they were familiar with our program and that the patient is eager for IP Rehab.  Will proceed with insurance authorization.  Plan to follow for timing of medical readiness, insurance authorization, and bed availability.  Please call with questions.  Carmelia Roller., CCC/SLP Admission Coordinator  [facility]  Cell [phone]

## 2016-08-22 NOTE — Progress Notes (Signed)
Physical Therapy Treatment Patient Details Name: Elaine Jackson MRN: [id number] DOB: April 18, 1956 Today's Date: 08/22/2016    History of Present Illness Pt is 61 y/o female admitted secondary to Acute Ischemia in th R cerebellar hemisphere and vertebral artery dissection. PMH includes CAD, HTN, DM, heart murmur, vitamin D deficiency, and skin cancer.     PT Comments    Pt progressing well towards physical therapy goals. Was able to improve ambulation technique and distance this session, with reports of no nausea and minimal dizziness. Without RW, pt required up to mod assist for balance support and safety. Was able to complete stair training with mod assist for support on descent. Continue to feel this patient would thrive in the CIR environment, and would benefit from a higher level of rehab to maximize functional independence and return to PLOF. Will continue to follow.    Follow Up Recommendations  CIR;Supervision/Assistance - 24 hour     Equipment Recommendations  Other (comment) (TBD by next venue of care)    Recommendations for Other Services Rehab consult     Precautions / Restrictions Precautions Precautions: Fall Precaution Comments: Demonstrating decreased steadiness with ambulation with staggering to R. impulsive at times and moves quickly    Mobility  Bed Mobility Overal bed mobility: Needs Assistance Bed Mobility: Supine to Sit     Supine to sit: Min guard     General bed mobility comments: Close guard for safety as pt transitioned to full sitting position at EOB. Pt reports improvement in symptoms however continues to demonstrate a R lateral lean at times.   Transfers Overall transfer level: Needs assistance Equipment used: Rolling walker (2 wheeled);None Transfers: Sit to/from Stand Sit to Stand: Min assist         General transfer comment: Without RW for support, pt required assistance for controlled descent to chair. Hands-on guarding for safety as pt  powered-up to full standing position.   Ambulation/Gait Ambulation/Gait assistance: Min assist;Mod assist Ambulation Distance (Feet): 80 Feet Assistive device: Rolling walker (2 wheeled);1 person hand held assist Gait Pattern/deviations: Step-through pattern;Decreased stride length;Staggering right;Drifts right/left;Wide base of support;Trunk flexed Gait velocity: Decreased Gait velocity interpretation: Below normal speed for age/gender General Gait Details: With RW, pt required min assist and demonstrated a heavy R lateral lean initially. Improved with distance, and attempted walk back to room without RW. Mod assist provided for balance support and safety. Pt holding therapist's hand with L and R side was in a high guard position.    Stairs Stairs: Yes   Stair Management: No rails;Step to pattern;Forwards (HHA) Number of Stairs: 4 (2 x2) General stair comments: VC's for sequencing and safety. Min assist provided for ascending stairs, and mod assist provided for descending stairs.   Wheelchair Mobility    Modified Rankin (Stroke Patients Only) Modified Rankin (Stroke Patients Only) Pre-Morbid Rankin Score: No symptoms Modified Rankin: Moderately severe disability     Balance Overall balance assessment: Needs assistance Sitting-balance support: No upper extremity supported;Feet supported Sitting balance-Leahy Scale: Fair     Standing balance support: No upper extremity supported Standing balance-Leahy Scale: Poor Standing balance comment: Dynamic standing balance                    Cognition Arousal/Alertness: Awake/alert Behavior During Therapy: WFL for tasks assessed/performed Overall Cognitive Status: Within Functional Limits for tasks assessed                      Exercises  General Comments        Pertinent Vitals/Pain Pain Assessment: No/denies pain    Home Living                      Prior Function            PT Goals  (current goals can now be found in the care plan section) Acute Rehab PT Goals PT Goal Formulation: With patient/family Time For Goal Achievement: 09/04/16 Potential to Achieve Goals: Good Progress towards PT goals: Progressing toward goals    Frequency    Min 4X/week      PT Plan Current plan remains appropriate    Co-evaluation             End of Session Equipment Utilized During Treatment: Gait belt Activity Tolerance: Patient tolerated treatment well Patient left: in chair;with call bell/phone within reach;with chair alarm set;with family/visitor present Nurse Communication: Mobility status PT Visit Diagnosis: Unsteadiness on feet (R26.81);Muscle weakness (generalized) (M62.81);Other symptoms and signs involving the nervous system (R29.898)     Time: [id number] PT Time Calculation (min) (ACUTE ONLY): 13 min  Charges:  $Gait Training: 8-22 mins                    G Codes:       Thelma Comp 09/04/2016, 3:23 PM   Rolinda Roan, PT, DPT Acute Rehabilitation Services Pager: [phone]

## 2016-08-22 NOTE — Progress Notes (Signed)
  Echocardiogram 2D Echocardiogram has been performed.  Jennette Dubin 08/22/2016, 2:17 PM

## 2016-08-22 NOTE — Progress Notes (Signed)
Progress Note  Patient Name: Elaine Jackson Date of Encounter: 08/22/2016  Primary Cardiologist: Dr Marlou Porch  Subjective   She looks much better this am, no headache, good appetite  Inpatient Medications    Scheduled Meds: .  stroke: mapping our early stages of recovery book   Does not apply Once  . aspirin EC  325 mg Oral Daily  . atorvastatin  80 mg Oral q1800  . cholecalciferol  2,000 Units Oral Daily  . clopidogrel  300 mg Oral Once  . clopidogrel  75 mg Oral Daily  . enoxaparin (LOVENOX) injection  40 mg Subcutaneous Q24H  . insulin aspart  0-9 Units Subcutaneous Q4H  . montelukast  10 mg Oral QHS  .  morphine injection  4 mg Intravenous Once   Continuous Infusions:  PRN Meds: acetaminophen **OR** acetaminophen (TYLENOL) oral liquid 160 mg/5 mL **OR** acetaminophen, hydrALAZINE   Vital Signs    Vitals:   08/21/16 1815 08/21/16 2126 08/22/16 0030 08/22/16 0618  BP: (!) 108/54 (!) 120/53 123/62 (!) 126/53  Pulse: 63 65 61 (!) 57  Resp: 20 18 18 18   Temp: 98.9 F (37.2 C) 98.9 F (37.2 C) 98.4 F (36.9 C) 98.5 F (36.9 C)  TempSrc: Oral Oral Oral Oral  SpO2: 100% 97% 100% 96%    Intake/Output Summary (Last 24 hours) at 08/22/16 [id number] Last data filed at 08/22/16 0700  Gross per 24 hour  Intake              620 ml  Output                0 ml  Net              620 ml   There were no vitals filed for this visit.  Telemetry     NSR, SB, sinus arrhyhtmia. No HR less than 50 last 24 hrs. Personally Reviewed  ECG    NSR, SB - Personally Reviewed  Physical Exam   GEN: No acute distress.   Neck: No JVD Cardiac: RRR, no murmurs, rubs, or gallops.  Respiratory: Clear to auscultation bilaterally. GI: Soft, nontender, non-distended  MS: No edema; No deformity. Neuro:  Nonfocal  Psych: Normal affect   Labs    Chemistry  Recent Labs Lab 08/20/16 1127 08/20/16 1155  NA 137 139  K 4.4 4.4  CL 102 102  CO2 22  --   GLUCOSE 177* 181*  BUN 26* 35*    CREATININE 1.13* 1.20*  CALCIUM 9.8  --   GFRNONAA 51*  --   GFRAA 60*  --   ANIONGAP 13  --      Hematology  Recent Labs Lab 08/20/16 1127 08/20/16 1155  WBC 9.0  --   RBC 4.04  --   HGB 12.2 12.6  HCT 37.8 37.0  MCV 93.6  --   MCH 30.2  --   MCHC 32.3  --   RDW 13.5  --   PLT 243  --     Cardiac EnzymesNo results for input(s): TROPONINI in the last 168 hours.   Recent Labs Lab 08/20/16 1139  TROPIPOC 0.01    Lipid Panel     Component Value Date/Time   CHOL 128 08/21/2016 0147   TRIG 56 08/21/2016 0147   HDL 53 08/21/2016 0147   CHOLHDL 2.4 08/21/2016 0147   VLDL 11 08/21/2016 0147   LDLCALC 64 08/21/2016 0147    Radiology    Ct Angio Head W Or  Wo Contrast  Result Date: 08/20/2016 CLINICAL DATA:  Headache and vertigo EXAM: CT ANGIOGRAPHY HEAD AND NECK   IMPRESSION: 1. Tapering of the right vertebral artery, which becomes occluded at the level of C5. The appearance is concerning for dissection. 2. Non opacification of the right anterior and posterior inferior cerebellar arteries. Given the clinical context, the findings are compatible with posterior fossa ischemia. Further evaluation with MRI may be helpful to evaluate for an acute cerebellar infarct. 3. 2 cm left thyroid nodule. Nonemergent follow-up with dedicated thyroid ultrasound is recommended. 4. Variant arch anatomy with the brachiocephalic and left common carotid arteries sharing a common origin. No right subclavian stenosis. Wide patency of right vertebral artery origin. Critical Value/emergent results were called by telephone at the time of interpretation on 08/20/2016 at 3:08 pm to Dr. Charlesetta Shanks , who verbally acknowledged these results. Electronically Signed   By: Ulyses Jarred M.D.   On: 08/20/2016 15:16    MRI Brain Wo Contrast   IMPRESSION: 1.  No hematoma or mass effect. 2. 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 3. Old left  cerebellar infarct. Electronically Signed   By: Ulyses Jarred M.D.   On: 08/20/2016 20:41   Ct Head Code Stroke W/o Cm  Result Date: 08/20/2016 CLINICAL DATA:  Code stroke. Severe dizziness with RIGHT-sided headache beginning earlier today. EXAM: CT HEAD WITHOUT CONTRAST TECHNIQUE:  10 IMPRESSION: 1. Atrophy and small vessel disease. No acute intracranial findings. No findings suggestive of large vessel occlusion. 2. ASPECTS is 10 These results were called by telephone at the time of interpretation on 08/20/2016 at 12:15 pm to Dr. Leonel Ramsay , who verbally acknowledged these results. Electronically Signed   By: Staci Righter M.D.   On: 08/20/2016 12:16    Cardiac Studies   Echo pending- echo in 2016-Normal LVF  Patient Profile     61 y.o. female 61 y.o. female with a history of DMT2, HTN, HLD, obesity s/p lap banding, CAD s/p apical MI (11/2014), normal LVF by echo June 2016, and prior sinus bradycardia who presented to [facility] on 08/20/16 for evaluation of dizziness, slurred speech and HA. MRI shows multiple small foci of acute ischemia within the right cerebellar hemisphere, within the PICA territory. CTA neck shows tapering of the right vertebral artery, which becomes occluded at the level of C5. The appearance is concerning for dissection. Cardiology consulted due to bradycardia.   Assessment & Plan    Sinus bradycardia: she has a long history of this. She is on no AVN blocking agents. Her lowest HR recorded was ~ around 40. Per review of RN reports, HRs went into high 30s. Discussed with neuro PA and worsening of her chronic bradycardia may be due to compression of her vagus nerve 2/2 vertebral dissection.  No indication for PPM at this time.   Acute CVA in setting of vertebral artery dissection: with non opacification of the right anterior and posterior inferior cerebellar arteries on CTA,. Given the clinical context, the findings are compatible with posterior fossa ischemia. MRI brain shows an  old stroke as well as multiple ischemic focci in Rt brain.   Vertebral dissection: this could explain her sx. Plan is for ASA/ plavix  HTN: BP well controlled currently.   CAD:  Troponin neg x1. ECG with no acute ST or TW changes. No recent angina.  HLD: continue statin -LDL 64  Plan:   There is some concern she has PAF  with SSS (hx of tachycardia 48 hrs prior to adm).  Echo pending. She is currently on ASA and Plavix.  Consider Holter or ?- Link at discharge.   Angelena Form, PA-C  08/22/2016, 9:38 AM    Patient seen and examined. Agree with assessment and plan. Feels much better. No H/A; nausea has resolved. Rhythm remains sinus without ectopy. LDL 64 when checked yesterday on high potency statin. Echo not yet done. Troy Sine, MD, [facility] 08/22/2016 10:21 AM

## 2016-08-23 ENCOUNTER — Encounter (HOSPITAL_COMMUNITY)
Admission: EM | Disposition: A | Discharge status: Rehabilitation Facility | Payer: Self-pay | Source: Home / Self Care | Attending: Family Medicine

## 2016-08-23 ENCOUNTER — Encounter (HOSPITAL_COMMUNITY): Payer: Self-pay | Admitting: *Deleted

## 2016-08-23 ENCOUNTER — Inpatient Hospital Stay (HOSPITAL_COMMUNITY)
Admission: RE | Admit: 2016-08-23 | Discharge: 2016-08-26 | DRG: 057 | Disposition: A | Discharge status: Home / Self Care | Payer: BLUE CROSS/BLUE SHIELD | Source: Intra-hospital | Attending: Physical Medicine & Rehabilitation | Admitting: Physical Medicine & Rehabilitation

## 2016-08-23 DIAGNOSIS — E1169 Type 2 diabetes mellitus with other specified complication: Secondary | ICD-10-CM

## 2016-08-23 DIAGNOSIS — R269 Unspecified abnormalities of gait and mobility: Secondary | ICD-10-CM

## 2016-08-23 DIAGNOSIS — Z7982 Long term (current) use of aspirin: Secondary | ICD-10-CM

## 2016-08-23 DIAGNOSIS — K5900 Constipation, unspecified: Secondary | ICD-10-CM

## 2016-08-23 DIAGNOSIS — E559 Vitamin D deficiency, unspecified: Secondary | ICD-10-CM | POA: Diagnosis not present

## 2016-08-23 DIAGNOSIS — Z79899 Other long term (current) drug therapy: Secondary | ICD-10-CM

## 2016-08-23 DIAGNOSIS — E669 Obesity, unspecified: Secondary | ICD-10-CM

## 2016-08-23 DIAGNOSIS — E119 Type 2 diabetes mellitus without complications: Secondary | ICD-10-CM | POA: Diagnosis not present

## 2016-08-23 DIAGNOSIS — D62 Acute posthemorrhagic anemia: Secondary | ICD-10-CM

## 2016-08-23 DIAGNOSIS — Z85828 Personal history of other malignant neoplasm of skin: Secondary | ICD-10-CM

## 2016-08-23 DIAGNOSIS — I69398 Other sequelae of cerebral infarction: Secondary | ICD-10-CM | POA: Diagnosis present

## 2016-08-23 DIAGNOSIS — I10 Essential (primary) hypertension: Secondary | ICD-10-CM

## 2016-08-23 DIAGNOSIS — N179 Acute kidney failure, unspecified: Secondary | ICD-10-CM | POA: Diagnosis not present

## 2016-08-23 DIAGNOSIS — Z87891 Personal history of nicotine dependence: Secondary | ICD-10-CM

## 2016-08-23 DIAGNOSIS — Z7902 Long term (current) use of antithrombotics/antiplatelets: Secondary | ICD-10-CM

## 2016-08-23 DIAGNOSIS — E785 Hyperlipidemia, unspecified: Secondary | ICD-10-CM | POA: Diagnosis present

## 2016-08-23 DIAGNOSIS — R001 Bradycardia, unspecified: Secondary | ICD-10-CM

## 2016-08-23 DIAGNOSIS — D72829 Elevated white blood cell count, unspecified: Secondary | ICD-10-CM | POA: Diagnosis not present

## 2016-08-23 DIAGNOSIS — I639 Cerebral infarction, unspecified: Secondary | ICD-10-CM

## 2016-08-23 DIAGNOSIS — K5901 Slow transit constipation: Secondary | ICD-10-CM

## 2016-08-23 DIAGNOSIS — I2510 Atherosclerotic heart disease of native coronary artery without angina pectoris: Secondary | ICD-10-CM | POA: Diagnosis not present

## 2016-08-23 DIAGNOSIS — Z8669 Personal history of other diseases of the nervous system and sense organs: Secondary | ICD-10-CM

## 2016-08-23 DIAGNOSIS — Z6837 Body mass index (BMI) 37.0-37.9, adult: Secondary | ICD-10-CM | POA: Diagnosis not present

## 2016-08-23 DIAGNOSIS — I638 Other cerebral infarction: Secondary | ICD-10-CM

## 2016-08-23 HISTORY — PX: LOOP RECORDER INSERTION: EP1214

## 2016-08-23 LAB — GLUCOSE, CAPILLARY
GLUCOSE-CAPILLARY: 89 mg/dL (ref 65–99)
Glucose-Capillary: 123 mg/dL — ABNORMAL HIGH (ref 65–99)
Glucose-Capillary: 113 mg/dL — ABNORMAL HIGH (ref 65–99)
Glucose-Capillary: 120 mg/dL — ABNORMAL HIGH (ref 65–99)
Glucose-Capillary: 93 mg/dL (ref 65–99)

## 2016-08-23 SURGERY — LOOP RECORDER INSERTION
Anesthesia: LOCAL

## 2016-08-23 MED ORDER — ENOXAPARIN SODIUM 40 MG/0.4ML ~~LOC~~ SOLN
40.0000 mg | SUBCUTANEOUS | Status: DC
  Administered 2016-08-23 – 2016-08-25 (×3): 40 mg via SUBCUTANEOUS
  Filled 2016-08-23 (×3): qty 0.4

## 2016-08-23 MED ORDER — DIPHENHYDRAMINE HCL 12.5 MG/5ML PO ELIX: 12.5000 mg | ORAL_SOLUTION | Freq: Four times a day (QID) | ORAL | Status: DC | PRN

## 2016-08-23 MED ORDER — ASPIRIN EC 325 MG PO TBEC
325.0000 mg | DELAYED_RELEASE_TABLET | Freq: Every day | ORAL | Status: DC
Start: 2016-08-24 — End: 2016-08-26
  Administered 2016-08-24 – 2016-08-26 (×3): 325 mg via ORAL
  Filled 2016-08-23 (×3): qty 1

## 2016-08-23 MED ORDER — INSULIN ASPART 100 UNIT/ML ~~LOC~~ SOLN
0.0000 [IU] | SUBCUTANEOUS | Status: DC
  Administered 2016-08-24: 1 [IU] via SUBCUTANEOUS

## 2016-08-23 MED ORDER — POLYETHYLENE GLYCOL 3350 17 G PO PACK
17.0000 g | PACK | Freq: Every day | ORAL | Status: DC
  Administered 2016-08-23 – 2016-08-24 (×2): 17 g via ORAL
  Filled 2016-08-23 (×3): qty 1

## 2016-08-23 MED ORDER — FLEET ENEMA 7-19 GM/118ML RE ENEM: 1.0000 | ENEMA | Freq: Once | RECTAL | Status: DC | PRN

## 2016-08-23 MED ORDER — PROCHLORPERAZINE 25 MG RE SUPP: 12.5000 mg | Freq: Four times a day (QID) | RECTAL | Status: DC | PRN

## 2016-08-23 MED ORDER — PROCHLORPERAZINE MALEATE 5 MG PO TABS: 5.0000 mg | ORAL_TABLET | Freq: Four times a day (QID) | ORAL | Status: DC | PRN

## 2016-08-23 MED ORDER — ATORVASTATIN CALCIUM 80 MG PO TABS
80.0000 mg | ORAL_TABLET | Freq: Every day | ORAL | Status: DC
  Administered 2016-08-23 – 2016-08-25 (×3): 80 mg via ORAL
  Filled 2016-08-23 (×3): qty 1

## 2016-08-23 MED ORDER — MONTELUKAST SODIUM 10 MG PO TABS
10.0000 mg | ORAL_TABLET | Freq: Every day | ORAL | Status: DC
  Administered 2016-08-23 – 2016-08-25 (×3): 10 mg via ORAL
  Filled 2016-08-23 (×3): qty 1

## 2016-08-23 MED ORDER — LIDOCAINE HCL (PF) 1 % IJ SOLN
INTRAMUSCULAR | Status: DC | PRN
  Administered 2016-08-23: 10 mL

## 2016-08-23 MED ORDER — CLOPIDOGREL BISULFATE 75 MG PO TABS
75.0000 mg | ORAL_TABLET | Freq: Every day | ORAL | Status: DC
  Administered 2016-08-24 – 2016-08-26 (×3): 75 mg via ORAL
  Filled 2016-08-23 (×3): qty 1

## 2016-08-23 MED ORDER — PROCHLORPERAZINE EDISYLATE 5 MG/ML IJ SOLN: 5.0000 mg | Freq: Four times a day (QID) | INTRAMUSCULAR | Status: DC | PRN

## 2016-08-23 MED ORDER — ACETAMINOPHEN 325 MG PO TABS: 325.0000 mg | ORAL_TABLET | ORAL | Status: DC | PRN

## 2016-08-23 MED ORDER — LIDOCAINE HCL (PF) 1 % IJ SOLN
INTRAMUSCULAR | Status: AC
  Filled 2016-08-23: qty 30

## 2016-08-23 MED ORDER — ALUM & MAG HYDROXIDE-SIMETH 200-200-20 MG/5ML PO SUSP: 30.0000 mL | ORAL | Status: DC | PRN

## 2016-08-23 MED ORDER — TRAZODONE HCL 50 MG PO TABS: 25.0000 mg | ORAL_TABLET | Freq: Every evening | ORAL | Status: DC | PRN

## 2016-08-23 MED ORDER — GUAIFENESIN-DM 100-10 MG/5ML PO SYRP: 5.0000 mL | ORAL_SOLUTION | Freq: Four times a day (QID) | ORAL | Status: DC | PRN

## 2016-08-23 MED ORDER — BISACODYL 10 MG RE SUPP: 10.0000 mg | Freq: Every day | RECTAL | Status: DC | PRN

## 2016-08-23 MED ORDER — VITAMIN D 1000 UNITS PO TABS
2000.0000 [IU] | ORAL_TABLET | Freq: Every day | ORAL | Status: DC
  Administered 2016-08-24 – 2016-08-26 (×3): 2000 [IU] via ORAL
  Filled 2016-08-23 (×3): qty 2

## 2016-08-23 SURGICAL SUPPLY — 2 items
LOOP REVEAL LINQSYS (Prosthesis & Implant Heart) ×1 IMPLANT
PACK LOOP INSERTION (CUSTOM PROCEDURE TRAY) ×2 IMPLANT

## 2016-08-23 NOTE — Progress Notes (Signed)
Patient information reviewed and entered into eRehab system by Zamauri Nez, RN, CRRN, PPS Coordinator.  Information including medical coding and functional independence measure will be reviewed and updated through discharge.    

## 2016-08-23 NOTE — Discharge Instructions (Signed)
Keep incision clean and dry for 3 days. You can remove outer dressing tomorrow. Leave steri-strips (little pieces of tape) on for 3 days Call the office [phone]) for redness, drainage, swelling, or fever.

## 2016-08-23 NOTE — Progress Notes (Signed)
Inpatient Rehabilitation  Met with patient to update about getting authorization for IP Rehab.  Also spoke with cardiology PA regarding plan for loop recorder placement today.  Hopeful for IP Rehab admission later today.  Please call with questions.   Carmelia Roller., CCC/SLP Admission Coordinator  [facility]  Cell [phone]

## 2016-08-23 NOTE — Progress Notes (Signed)
Pt returned from loop recorder.  Old marked drainage to dressing to upper left chest. Pt denies pain or discomfort. Will continue to monitor.

## 2016-08-23 NOTE — Progress Notes (Signed)
Progress Note  Patient Name: Elaine Jackson Date of Encounter: 08/23/2016  Primary Cardiologist: Dr Marlou Porch  Subjective   She looks much better this am, no headache, good appetite  Inpatient Medications    Scheduled Meds: .  stroke: mapping our early stages of recovery book   Does not apply Once  . aspirin EC  325 mg Oral Daily  . atorvastatin  80 mg Oral q1800  . cholecalciferol  2,000 Units Oral Daily  . clopidogrel  300 mg Oral Once  . clopidogrel  75 mg Oral Daily  . enoxaparin (LOVENOX) injection  40 mg Subcutaneous Q24H  . insulin aspart  0-9 Units Subcutaneous Q4H  . montelukast  10 mg Oral QHS  .  morphine injection  4 mg Intravenous Once   Continuous Infusions:  PRN Meds: acetaminophen **OR** acetaminophen (TYLENOL) oral liquid 160 mg/5 mL **OR** acetaminophen, hydrALAZINE   Vital Signs    Vitals:   08/22/16 1754 08/22/16 2044 08/23/16 0003 08/23/16 0518  BP: (!) 113/52 (!) 129/52 (!) 109/37 122/65  Pulse: 67 (!) 56 63 83  Resp: 18 16 16 16   Temp: 98.7 F (37.1 C) 98.5 F (36.9 C) 98.5 F (36.9 C) 98.6 F (37 C)  TempSrc: Oral Oral Oral Oral  SpO2: 100% 98% 96% 97%    Intake/Output Summary (Last 24 hours) at 08/23/16 0817 Last data filed at 08/22/16 1700  Gross per 24 hour  Intake              240 ml  Output                0 ml  Net              240 ml   There were no vitals filed for this visit.  Telemetry     NSR, SB, sinus arrhyhtmia. No HR less than 50 last 24 hrs. Personally Reviewed  ECG    NSR, SB - Personally Reviewed  Physical Exam   GEN: No acute distress.   Neck: No JVD Cardiac: RRR, no murmurs, rubs, or gallops.  Respiratory: Clear to auscultation bilaterally. GI: Soft, nontender, non-distended  MS: No edema; No deformity. Neuro:  Nonfocal  Psych: Normal affect   Labs    Chemistry  Recent Labs Lab 08/20/16 1127 08/20/16 1155  NA 137 139  K 4.4 4.4  CL 102 102  CO2 22  --   GLUCOSE 177* 181*  BUN 26* 35*    CREATININE 1.13* 1.20*  CALCIUM 9.8  --   GFRNONAA 51*  --   GFRAA 60*  --   ANIONGAP 13  --      Hematology  Recent Labs Lab 08/20/16 1127 08/20/16 1155  WBC 9.0  --   RBC 4.04  --   HGB 12.2 12.6  HCT 37.8 37.0  MCV 93.6  --   MCH 30.2  --   MCHC 32.3  --   RDW 13.5  --   PLT 243  --     Cardiac EnzymesNo results for input(s): TROPONINI in the last 168 hours.   Recent Labs Lab 08/20/16 1139  TROPIPOC 0.01    Lipid Panel     Component Value Date/Time   CHOL 128 08/21/2016 0147   TRIG 56 08/21/2016 0147   HDL 53 08/21/2016 0147   CHOLHDL 2.4 08/21/2016 0147   VLDL 11 08/21/2016 0147   LDLCALC 64 08/21/2016 0147    Radiology    Ct Angio Head W Or  Wo Contrast  Result Date: 08/20/2016 CLINICAL DATA:  Headache and vertigo EXAM: CT ANGIOGRAPHY HEAD AND NECK   IMPRESSION: 1. Tapering of the right vertebral artery, which becomes occluded at the level of C5. The appearance is concerning for dissection. 2. Non opacification of the right anterior and posterior inferior cerebellar arteries. Given the clinical context, the findings are compatible with posterior fossa ischemia. Further evaluation with MRI may be helpful to evaluate for an acute cerebellar infarct. 3. 2 cm left thyroid nodule. Nonemergent follow-up with dedicated thyroid ultrasound is recommended. 4. Variant arch anatomy with the brachiocephalic and left common carotid arteries sharing a common origin. No right subclavian stenosis. Wide patency of right vertebral artery origin. Critical Value/emergent results were called by telephone at the time of interpretation on 08/20/2016 at 3:08 pm to Dr. Charlesetta Shanks , who verbally acknowledged these results. Electronically Signed   By: Ulyses Jarred M.D.   On: 08/20/2016 15:16    MRI Brain Wo Contrast   IMPRESSION: 1.  No hematoma or mass effect. 2. 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 3. Old left  cerebellar infarct. Electronically Signed   By: Ulyses Jarred M.D.   On: 08/20/2016 20:41   Ct Head Code Stroke W/o Cm  Result Date: 08/20/2016 CLINICAL DATA:  Code stroke. Severe dizziness with RIGHT-sided headache beginning earlier today. EXAM: CT HEAD WITHOUT CONTRAST TECHNIQUE:  10 IMPRESSION: 1. Atrophy and small vessel disease. No acute intracranial findings. No findings suggestive of large vessel occlusion. 2. ASPECTS is 10 These results were called by telephone at the time of interpretation on 08/20/2016 at 12:15 pm to Dr. Leonel Ramsay , who verbally acknowledged these results. Electronically Signed   By: Staci Righter M.D.   On: 08/20/2016 12:16    Cardiac Studies   Echo 08/22/16- Study Conclusions  - Left ventricle: The cavity size was normal. Wall thickness was   normal. Systolic function was normal. The estimated ejection   fraction was in the range of 60% to 65%. Wall motion was normal;   there were no regional wall motion abnormalities. Doppler   parameters are consistent with abnormal left ventricular   relaxation (grade 1 diastolic dysfunction). - Aortic valve: There was trivial regurgitation.  Impressions:  - No cardiac source of emboli was indentified.   Patient Profile     61 y.o. female 61 y.o. female with a history of DMT2, HTN, HLD, obesity s/p lap banding, CAD s/p apical MI (11/2014), normal LVF by echo June 2016, and prior sinus bradycardia who presented to [facility] on 08/20/16 for evaluation of dizziness, slurred speech and HA. MRI shows multiple small foci of acute ischemia within the right cerebellar hemisphere, within the PICA territory. CTA neck shows tapering of the right vertebral artery, which becomes occluded at the level of C5. The appearance is concerning for dissection. Cardiology consulted due to bradycardia.   Assessment & Plan    Sinus bradycardia: she has a long history of this. She is on no AVN blocking agents. Her lowest HR recorded was ~ around 40.  Per review of RN reports, HRs went into high 30s. Discussed with neuro PA and worsening of her chronic bradycardia may be due to compression of her vagus nerve 2/2 vertebral dissection.  No indication for PPM at this time.   Acute CVA in setting of vertebral artery dissection: with non opacification of the right anterior and posterior inferior cerebellar arteries on CTA,. Given the  clinical context, the findings are compatible with posterior fossa ischemia. MRI brain shows an old stroke as well as multiple ischemic focci in Rt brain.   Vertebral dissection: this could explain her sx. Plan is for ASA/ plavix  HTN: BP well controlled currently.   CAD:  Troponin neg x1. ECG with no acute ST or TW changes. No recent angina.  HLD: continue statin -LDL 64  Plan:   There is some concern she has PAF with SSS (hx of tachycardia 48 hrs prior to adm). See telemetry strips this am. She gives a history of intermittent episodes of tachycardia. She has had an old Lt brain stroke by MRI. She is currently on ASA and Plavix.  Consider Link Recorder.  Angelena Form, PA-C  08/23/2016, 8:17 AM    Patient seen and examined. Agree with assessment and plan. Rhythm strip today shows sinus with 7 beat  burst of increased rate. With MRI suggesting a prior left cerebellar infarct which pt was unaware and with recent R acute vertebral occlusion will have a link monitor placed to allow for long term monitoring of arrhythmic etiology .   Troy Sine, MD, [place] 08/23/2016 8:28 AM

## 2016-08-23 NOTE — Consult Note (Signed)
ELECTROPHYSIOLOGY CONSULT NOTE  Patient ID: Elaine Jackson MRN: [id number], DOB/AGE: March 14, 1956   Admit date: 08/20/2016 Date of Consult: 08/23/2016  Primary Physician: Reginia Forts, MD Primary Cardiologist: Dr. Marlou Porch Reason for Consultation: palpitations, hx of old CVA Requesting MD: Dr. Claiborne Billings  History of Present Illness ANNDREA Jackson was admitted on 08/20/2016 with acute onset of dizziness, headache, found with acute CVA, felt secondary to large vessel source,  right PICA territory infarcts in setting of R VA and R PICA occlusion.  Infarcts secondary to large vessel source by neuro note.   Cardiology on the case note c/o palpitations and very brief PAT episodes on telemetry and given imaging noted an old area of left stroke requests ILR.  PMHx includes CAD, HTN, DM, chronic bradycardia.  The patient has been monitored on telemetry which has demonstrated sinus rhythm with no arrhythmias.   Echocardiogram this admission demonstrated   Study Conclusions - Left ventricle: The cavity size was normal. Wall thickness was   normal. Systolic function was normal. The estimated ejection   fraction was in the range of 60% to 65%. Wall motion was normal;   there were no regional wall motion abnormalities. Doppler   parameters are consistent with abnormal left ventricular   relaxation (grade 1 diastolic dysfunction). - Aortic valve: There was trivial regurgitation. Impressions: - No cardiac source of emboli was indentified.    Lab work is reviewed.   They are recovering from their stroke with plans to CIR at discharge.  She has felt well since her MI in 2016, no CP or SOB, no near syncope or syncope history.  She has had palpitations, initially noted at the time of her MI a couple years ago, though since then will have some light palpitations maybe a couple times a year, they are brief, most recently last weekend woke with her HR fast, reportedly by her count about 110bpm, at baseline tends to be  bradycardic chronically 50's, high 40's      Past Medical History:  Diagnosis Date  . Allergic rhinitis, cause unspecified   . Allergy   . CAD (coronary artery disease)    LHC 12/20/2014 showed small to moderate apical infarction due to distal LAD stenosis with reestablished TIMI2 flow. No PCI. Recommended plavix for 1 yr and ASA for life.   . Chicken pox   . Diabetes mellitus   . Family history of breast cancer in first degree relative    sister age 32  . Heart murmur   . Hyperlipidemia   . Hypertension   . Malignant neoplasm skin of face 02/16/2007   Basal Cell Carcinoma  [place].  . Measles   . Obesity, unspecified   . Other chronic nonalcoholic liver disease   . Sleep related leg cramps   . Unspecified vitamin D deficiency      Surgical History:  Past Surgical History:  Procedure Laterality Date  . CARDIAC CATHETERIZATION N/A 12/20/2014   Procedure: Left Heart Cath and Coronary Angiography;  Surgeon: Sanda Caitlan Chauca, MD;  Location: [facility];  Service: Cardiovascular;  Laterality: N/A;  . CHOLECYSTECTOMY    . COLONOSCOPY  06/25/2007   [place]. Normal. Repeat in ten years.  Marland Kitchen LAPAROSCOPIC GASTRIC BANDING  07/03/10   Dr. Sherrin Daisy; [facility]  . TONSILLECTOMY       Prescriptions Prior to Admission  Medication Sig Dispense Refill Last Dose  . aspirin EC 81 MG EC tablet Take 1 tablet (81 mg total) by mouth daily.  08/20/2016 at 0800  . atorvastatin (LIPITOR) 80 MG tablet TAKE 1 TABLET (80 MG TOTAL) BY MOUTH DAILY AT 6 PM. 90 tablet 3 08/20/2016 at am  . cholecalciferol (VITAMIN D) 1000 UNITS tablet Take 1,000 Units by mouth daily.    08/20/2016 at Unknown time  . Cyanocobalamin (VITAMIN B-12 PO) Take 1 tablet by mouth daily.   08/20/2016 at am  . fluticasone (FLONASE) 50 MCG/ACT nasal spray Place 2 sprays into both nostrils daily as needed. For allergies  11 PRN at PRN  . Lancets (ONETOUCH ULTRASOFT) lancets Use as instructed -- check sugar once daily One touch ultra 2  100 each 3   . losartan-hydrochlorothiazide (HYZAAR) 100-25 MG tablet Take 1 tablet by mouth daily. 90 tablet 3 08/20/2016 at am  . montelukast (SINGULAIR) 10 MG tablet TAKE 1 TABLET (10 MG TOTAL) BY MOUTH AT BEDTIME. 90 tablet 3 08/20/2016 at am  . nitroGLYCERIN (NITROSTAT) 0.4 MG SL tablet Place 1 tablet (0.4 mg total) under the tongue every 5 (five) minutes x 3 doses as needed for chest pain. 25 tablet 3 08/18/2016 at 1200  . ONE TOUCH ULTRA TEST test strip USE TO TEST BLOOD SUGAR ONCE DAILY. E11.9 100 each 0   . clopidogrel (PLAVIX) 75 MG tablet Take 1 tablet (75 mg total) by mouth daily. (Patient not taking: Reported on 05/01/2016) 90 tablet 3 Not Taking at Unknown time    Inpatient Medications:  .  stroke: mapping our early stages of recovery book   Does not apply Once  . aspirin EC  325 mg Oral Daily  . atorvastatin  80 mg Oral q1800  . cholecalciferol  2,000 Units Oral Daily  . clopidogrel  300 mg Oral Once  . clopidogrel  75 mg Oral Daily  . enoxaparin (LOVENOX) injection  40 mg Subcutaneous Q24H  . insulin aspart  0-9 Units Subcutaneous Q4H  . montelukast  10 mg Oral QHS  .  morphine injection  4 mg Intravenous Once    Allergies:  Allergies  Allergen Reactions  . Sulfa Antibiotics Nausea And Vomiting    Social History   Social History  . Marital status: Married    Spouse name: N/A  . Number of children: 1  . Years of education: N/A   Occupational History  . 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 Smoking status: Former Smoker    Packs/day: 1.00    Years: 25.00    Types: Cigarettes  . Smokeless tobacco: Never Used     Comment: quit age 70     smoked from 88 to 69 off and on  . Alcohol use No  . Drug use: No  . Sexual activity: Yes    Birth control/ protection: Post-menopausal   Other Topics Concern  . Not on file   Social History Narrative   Marital status:  Married; x 26 years. Second marriage. Happily married; no abuse      Children:  one child, one stepson;two grandsons      Lives: with husband, mother in 2017      Employment:  [employer] part-time 25-30 hours per week; current job x 19 years.      Tobacco:  None      Alcohol: rare/minimal.      Drugs:  None       Exercise: none in 2017       Seatbelt:  100%.       Sunscreen: SPF 90.  Guns:  Maybe?     Family History  Problem Relation Age of Onset  . Diabetes Mother   . Heart disease Mother     defibrillator; carotid artery stenosis.  . Hypertension Mother   . Hyperlipidemia Mother   . Dementia Mother   . Congestive Heart Failure Mother   . Heart disease Father   . Kidney failure Father     ESRD/peritoneal dialysis  . Diabetes Father   . Hypertension Father   . Hyperlipidemia Father   . Stroke Father   . Kidney disease Father   . Congestive Heart Failure Father   . Diabetes Sister   . Hypertension Sister   . Kidney disease Sister     renal failure  . Diabetes Brother   . Hypertension Brother   . Hyperlipidemia Brother   . Arthritis Brother   . Cervical cancer Sister   . Hypertension Sister   . Liver cancer Sister 45  . Stroke Maternal Grandmother   . Heart disease Maternal Grandfather   . Stroke Paternal Grandmother   . Heart disease Paternal Grandmother   . Heart disease Paternal Grandfather   . Stroke Paternal Grandfather   . Breast cancer Sister 6  . Hepatitis C Sister 53      Review of Systems: All other systems reviewed and are otherwise negative except as noted above.  Physical Exam: Vitals:   08/22/16 2044 08/23/16 0003 08/23/16 0518 08/23/16 0835  BP: (!) 129/52 (!) 109/37 122/65 (!) 116/51  Pulse: (!) 56 63 83 (!) 55  Resp: 16 16 16 16   Temp: 98.5 F (36.9 C) 98.5 F (36.9 C) 98.6 F (37 C) 98.3 F (36.8 C)  TempSrc: Oral Oral Oral Oral  SpO2: 98% 96% 97% 97%    GEN- The patient is well appearing, alert and oriented x 3 today.   Head- normocephalic, atraumatic Eyes-  Sclera clear, conjunctiva pink Ears-  hearing intact Oropharynx- clear Neck- supple Lungs- CTA b/l, normal work of breathing Heart- RRR, no murmurs, rubs or gallops  GI- soft, NT, ND Extremities- no clubbing, cyanosis, or edema MS- no significant deformity or atrophy Skin- no rash or lesion Psych- euthymic mood, full affect   Labs:   Lab Results  Component Value Date   WBC 9.0 08/20/2016   HGB 12.6 08/20/2016   HCT 37.0 08/20/2016   MCV 93.6 08/20/2016   PLT 243 08/20/2016    Recent Labs Lab 08/20/16 1127 08/20/16 1155  NA 137 139  K 4.4 4.4  CL 102 102  CO2 22  --   BUN 26* 35*  CREATININE 1.13* 1.20*  CALCIUM 9.8  --   GLUCOSE 177* 181*   Lab Results  Component Value Date   CKTOTAL 87 02/02/2014   TROPONINI 15.00 (HH) 12/20/2014   Lab Results  Component Value Date   CHOL 128 08/21/2016   CHOL 120 05/01/2016   CHOL 133 09/26/2015   Lab Results  Component Value Date   HDL 53 08/21/2016   HDL 45 (L) 05/01/2016   HDL 51 09/26/2015   Lab Results  Component Value Date   LDLCALC 64 08/21/2016   LDLCALC 58 05/01/2016   LDLCALC 65 09/26/2015   Lab Results  Component Value Date   TRIG 56 08/21/2016   TRIG 87 05/01/2016   TRIG 87 09/26/2015   Lab Results  Component Value Date   CHOLHDL 2.4 08/21/2016   CHOLHDL 2.7 05/01/2016   CHOLHDL 2.6 09/26/2015   No results found for: LDLDIRECT  No results found for: DDIMER   Radiology/Studies:  Ct Angio Head W Or Wo Contrast Result Date: 08/20/2016 CLINICAL DATA:  Headache and vertigo EXAM: CT ANGIOGRAPHY HEAD AND NECK TECHNIQUE: Multidetector CT imaging of the head and neck was performed using the standard protocol during bolus administration of intravenous contrast. Multiplanar CT image reconstructions and MIPs were obtained to evaluate the vascular anatomy. Carotid stenosis measurements (when applicable) are obtained utilizing NASCET criteria, using the distal internal carotid diameter as the denominator. CONTRAST:  50 mL Isovue 370 IV  COMPARISON:  Head CT 08/20/2016 FINDINGS: CT HEAD FINDINGS Brain: No mass lesion, intraparenchymal hemorrhage or extra-axial collection. No evidence of acute cortical infarct. Mild volume loss. There is periventricular hypoattenuation compatible with chronic microvascular disease. Vascular: No hyperdense vessel or unexpected calcification. Skull: Normal visualized skull base, calvarium and extracranial soft tissues. Sinuses/Orbits: No sinus fluid levels or advanced mucosal thickening. No mastoid effusion. Normal orbits. CTA NECK FINDINGS Aortic arch: There is no aneurysm or dissection of the visualized ascending aorta or aortic arch. There is a normal variant aortic arch branching pattern with the brachiocephalic and left common carotid arteries sharing a common origin. The visualized proximal subclavian arteries are normal. Mild aortic arch atherosclerotic calcification. Right carotid system: The right common carotid origin is widely patent. There is no common carotid or internal carotid artery dissection or aneurysm. There is mild atherosclerotic calcification at the carotid bifurcation without hemodynamically significant stenosis. Left carotid system: The left common carotid origin is widely patent. There is no common carotid or internal carotid artery dissection or aneurysm. No hemodynamically significant stenosis. Vertebral arteries: The vertebral system is left dominant. Both vertebral artery origins are normal. There is progressive narrowing of the right vertebral artery, which becomes occluded at the C5 level. Minimal opacification of the distal right V4 segment likely secondary to retrograde flow. The left vertebral artery is normal. Skeleton: There is no bony spinal canal stenosis. No lytic or blastic lesions. Other neck: The nasopharynx is clear. The oropharynx and hypopharynx are normal. The epiglottis is normal. The supraglottic larynx, glottis and subglottic larynx are normal. No retropharyngeal  collection. The parapharyngeal spaces are preserved. The parotid and submandibular glands are normal. No sialolithiasis or salivary ductal dilatation. There is a heterogeneous left thyroid nodule measuring 2 cm. There is no cervical lymphadenopathy. Upper chest: No pneumothorax or pleural effusion. No nodules or masses. Review of the MIP images confirms the above findings CTA HEAD FINDINGS Anterior circulation: --Intracranial internal carotid arteries: Normal. --Anterior cerebral arteries: Normal. --Middle cerebral arteries: Normal. --Posterior communicating arteries: Absent bilaterally. Posterior circulation: --Posterior cerebral arteries: Normal. --Superior cerebellar arteries: Normal. --Basilar artery: Normal. --Anterior inferior cerebellar arteries: Present on the left. Not clearly seen on the right. --Posterior inferior cerebellar arteries: Normal on the left. Not seen on the right. Venous sinuses: As permitted by contrast timing, patent. Anatomic variants: None Delayed phase: No parenchymal contrast enhancement. Review of the MIP images confirms the above findings IMPRESSION: 1. Tapering of the right vertebral artery, which becomes occluded at the level of C5. The appearance is concerning for dissection. 2. Non opacification of the right anterior and posterior inferior cerebellar arteries. Given the clinical context, the findings are compatible with posterior fossa ischemia. Further evaluation with MRI may be helpful to evaluate for an acute cerebellar infarct. 3. 2 cm left thyroid nodule. Nonemergent follow-up with dedicated thyroid ultrasound is recommended. 4. Variant arch anatomy with the brachiocephalic and left common carotid arteries sharing a common origin. No right subclavian  stenosis. Wide patency of right vertebral artery origin. Critical Value/emergent results were called by telephone at the time of interpretation on 08/20/2016 at 3:08 pm to Dr. Charlesetta Shanks , who verbally acknowledged these  results. Electronically Signed   By: Ulyses Jarred M.D.   On: 08/20/2016 15:16     Mr Jodene Nam Head Wo Contrast Result Date: 08/20/2016 CLINICAL DATA:  Vertebral artery dissection.  Dizziness. EXAM: MRI HEAD WITHOUT CONTRAST MRA HEAD WITHOUT CONTRAST TECHNIQUE: Multiplanar, multiecho pulse sequences of the brain and surrounding structures were obtained without intravenous contrast. Angiographic images of the head were obtained using MRA technique without contrast. COMPARISON:  CTA head and neck 08/20/2016 FINDINGS: MRI HEAD FINDINGS Brain: There are multiple punctate foci of diffusion restriction within the right cerebellar hemisphere, within the right PICA territory. No other areas of diffusion restriction. No evidence of hemorrhage. There is an old left cerebellar infarct. The brain parenchymal signal is normal. No mass lesion or midline shift. No hydrocephalus or extra-axial fluid collection. The midline structures are normal. No age advanced or lobar predominant atrophy. Vascular: Major intracranial arterial and venous sinus flow voids are preserved. No evidence of chronic microhemorrhage or amyloid angiopathy. Skull and upper cervical spine: The visualized skull base, calvarium, upper cervical spine and extracranial soft tissues are normal. Sinuses/Orbits: No fluid levels or advanced mucosal thickening. No mastoid effusion. Normal orbits. MRA HEAD FINDINGS Intracranial internal carotid arteries: Normal. Anterior cerebral arteries: Normal. Middle cerebral arteries: Normal. Posterior communicating arteries: Present bilaterally. Posterior cerebral arteries: Normal. Basilar artery: Normal. Vertebral arteries: Left dominant. There is no flow related enhancement seen within the proximal V4 segment of the right vertebral artery. Flow related enhancement within a short segment of the most distal right V4 segment is likely due to retrograde filling. Superior cerebellar arteries: Normal. Anterior inferior cerebellar  arteries: Normal. Posterior inferior cerebellar arteries: No flow related enhancement of the right PICA. Left PICA is normal. IMPRESSION: 1. Multiple small foci of acute ischemia within the right cerebellar hemisphere, within the PICA territory. No hematoma or mass effect. 2. 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 3. Old left cerebellar infarct. Electronically Signed   By: Ulyses Jarred M.D.   On: 08/20/2016 20:41    12-lead ECG SR/SB All prior EKG's in EPIC reviewed with no documented atrial fibrillation  Telemetry SR/SB 50's, infrequent brief 5-7beats of ST vs A tach, no AFib  Assessment and Plan:  1. CVA     This event felt secondary to large vessel source and from a neuro standpoint recommended 30day monitor w/hx of palpitations     I discussed with neurology NP,  Regarding ?vertebral dissection, they were not entirely certain this was dissection, though even if it were, going forward is not a contraindication of full anticoagulation if the patient is found to have AF in the future.  2. Old R sided stroke by imaging     Cardiology requests loop implant given hx of infrequent palpitations and finding of old right sided CVA, as well as the very brief PAT's on telemetry  I spoke at length with the patient about monitoring for afib with either a 30 day event monitor or an implantable loop recorder.  Risks, benefits, and alteratives to implantable loop recorder were discussed with the patient today.   At this time, the patient is very clear in her decision to proceed with implantable loop recorder.   Wound care was reviewed with the patient (keep  incision clean and dry for 3 days).  Wound check will be scheduled for the patient  Please call with questions.   Baldwin Jamaica, PA-C 08/23/2016   Pt seen  And interviewed Recurrent strokes with palpitations   Will proceed with LINQ implant   Risks reviewed

## 2016-08-23 NOTE — Progress Notes (Addendum)
Inpatient Rehabilitation  I have received insurance authorization for IP Rehab admission.  Note cardiology recommendation for loop recorder placement.  Plan to follow along for timing of medical readiness and bed availability.  Please call with questions.  Carmelia Roller., CCC/SLP Admission Coordinator  [facility]  Cell [phone]

## 2016-08-23 NOTE — Progress Notes (Signed)
Report given to rehab nurse. Pt to be transferred to 4W2.

## 2016-08-23 NOTE — Progress Notes (Signed)
Occupational Therapy Treatment Patient Details Name: Elaine Jackson MRN: [id number] DOB: August 18, 1955 Today's Date: 08/23/2016    History of present illness Pt is 61 y/o female admitted secondary to Acute Ischemia in th R cerebellar hemisphere and vertebral artery dissection. PMH includes CAD, HTN, DM, heart murmur, vitamin D deficiency, and skin cancer.    OT comments  Pt making progress towards OT goals this session, improved visual scanning and sink level grooming. Pt compensating for balance by leaning against sink this session. 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 Pt is an excellent candidate as she is motivated and reports "the more I move, the better I feel".   Follow Up Recommendations  CIR    Equipment Recommendations  Other (comment) (defer to next venue of care)    Recommendations for Other Services      Precautions / Restrictions Precautions Precautions: Fall Restrictions Weight Bearing Restrictions: No       Mobility Bed Mobility            General bed mobility comments: Pt OOB in recliner upon arrival  Transfers Overall transfer level: Needs assistance Equipment used: None Transfers: Sit to/from Stand Sit to Stand: Min guard         General transfer comment: good carryover of hand placement    Balance Overall balance assessment: Needs assistance Sitting-balance support: No upper extremity supported;Feet supported Sitting balance-Leahy Scale: Fair (approaching good)     Standing balance support: No upper extremity supported Standing balance-Leahy Scale: Fair Standing balance comment: leaning against sink for balance for grooming              Pt demonstrated one LOB in hallway during visual scanning exercise.   ADL Overall ADL's : Needs assistance/impaired     Grooming: Min guard;Standing;Wash/dry face;Oral care;Brushing hair Grooming  Details (indicate cue type and reason): grooming items scattered around sink and Pt able to use visual scanning to locate ietms and perform 3 grooming tasks in sequence requested by OT with no verbal cues                 Toilet Transfer: Min guard;Ambulation;BSC (simulated with recliner) Toilet Transfer Details (indicate cue type and reason): carryover from previous sessions for safe hand placement         Functional mobility during ADLs: Min guard General ADL Comments: Pt reports "I feel better, and I do better the more I move"      Vision                 Additional Comments: Objects placed in all areas of bedside tray, and Pt able to locate all ADL items with no vc. Pt able to walk in [place] and no vc needed to read room numbers on right and left side. Pt WFL.   Perception     Praxis      Cognition   Behavior During Therapy: WFL for tasks assessed/performed Overall Cognitive Status: Within Functional Limits for tasks assessed  Pt able to follow 3 step commands with no verbal cues                       Exercises     Shoulder Instructions       General Comments      Pertinent Vitals/ Pain       Pain Assessment: No/denies pain  Home Living  Prior Functioning/Environment              Frequency  Min 3X/week        Progress Toward Goals  OT Goals(current goals can now be found in the care plan section)  Progress towards OT goals: Progressing toward goals  Acute Rehab OT Goals Patient Stated Goal: 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 OT Goal Formulation: With patient/family Time For Goal Achievement: 09/04/16 Potential to Achieve Goals: Good  Plan Discharge plan remains appropriate    Co-evaluation                 End of Session Equipment Utilized During Treatment: Gait belt  OT Visit Diagnosis: Unsteadiness on feet (R26.81)   Activity Tolerance Patient  tolerated treatment well   Patient Left in chair;with chair alarm set;with call bell/phone within reach;with family/visitor present   Nurse Communication Mobility status        Time: 1410-1435 OT Time Calculation (min): 25 min  Charges: OT General Charges $OT Visit: 1 Procedure OT Treatments $Self Care/Home Management : 23-37 mins  Hulda Humphrey OTR/L [place] 08/23/2016, 3:08 PM

## 2016-08-23 NOTE — PMR Pre-admission (Signed)
PMR Admission Coordinator Pre-Admission Assessment  Patient: Elaine Jackson is an 61 y.o., female MRN: [id number] DOB: September 17, 1955 Height:  5'1.75" Weight:                Insurance Information HMO:     PPO: X     PCP:      IPA:      80/20:      OTHER:  PRIMARY: BCBS      Policy#: [id number]      Subscriber: Elaine Jackson Name: Elaine Jackson      Phone#: [phone]     Fax#: [phone] Pre-Cert#: [id number]      Employer: Self-Employed  Benefits:  Phone #: [phone]     Name: Online Eff. 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 She was admitted on 08/20/16 and reported constant HA radiating from right temporal area to periorbital and right posterior neck as well as photophobia. UDS negative.  CT head without acute abnormality. CTA head/neck done revealing tapering of right VA which becomes occluded at C5 concerning for dissection, non opacification of R-ACA and posterior inferior PCA compatible  Posterior fossa ischemia.  MRI/MRA brain done revealing multiple small foci of ischemia in right cerebellar hemisphere within PICA territory, occlusion  of visualized portion of R-VA and right PICA. Neurology recommended adding Plavix to ASA for infarcts due to large vessel source and 30 day cardiac event monitoring recommended to rule out A fib due to reports of palpitations at home.   Cardiology consulted due to bradycardia with HR in 30's and felt that worsening of chronic bradycardia likely due to compression of vagus nerve from vertebral dissection and felt that there was no indications for PPM at this time. Dr. Claiborne Billings recommends long term use of low dose ASA and Plavix. 2 D echo revealed EF [id number], grade 1 diastolic dysfunction and no wall abnormality. Loop recorder to be placed today. Therapy ongoing and and patient with staggering gait and poor safety awareness. CIR recommended for follow up therapy.   NIH Total: 2    Past Medical History  Past Medical History:  Diagnosis Date  . Allergic rhinitis, cause unspecified   . Allergy   . CAD (coronary artery disease)    LHC 12/20/2014 showed small to moderate apical infarction due to distal LAD stenosis with reestablished TIMI2 flow. No PCI. Recommended plavix for 1 yr and ASA for life.   . Chicken pox   . Diabetes mellitus   .  Family history of breast cancer in first degree relative    sister age 72  . Heart murmur   . Hyperlipidemia   . Hypertension   . Malignant neoplasm skin of face 02/16/2007   Basal Cell Carcinoma  [place].  . Measles   . Obesity, unspecified   . Other chronic nonalcoholic liver disease   . Sleep related leg cramps   . Unspecified vitamin D deficiency     Family History  family history includes Arthritis in her brother; Breast cancer (age of onset: 91) in her sister; Cervical cancer in her sister; Congestive Heart Failure in her father and mother; Dementia in her mother; Diabetes in her brother, father, mother, and sister; Heart disease in her father, maternal grandfather, mother, paternal grandfather, and paternal grandmother; Hepatitis C (age of onset: 79) in  her sister; Hyperlipidemia in her brother, father, and mother; Hypertension in her brother, father, mother, sister, and sister; Kidney disease in her father and sister; Kidney failure in her father; Liver cancer (age of onset: 62) in her sister; Stroke in her father, maternal grandmother, paternal grandfather, and paternal grandmother.  Prior Rehab/Hospitalizations:  Has the patient had major surgery during 100 days prior to admission? No  Current Medications   Current Facility-Administered Medications:  .   stroke: mapping our early stages of recovery book, , Does not apply, Once, [name], NP .  acetaminophen (TYLENOL) tablet 650 mg, 650 mg, Oral, Q4H PRN, 650 mg at 08/21/16 2047 **OR** acetaminophen (TYLENOL) solution 650 mg, 650 mg, Per Tube, Q4H PRN **OR** acetaminophen (TYLENOL) suppository 650 mg, 650 mg, Rectal, Q4H PRN, [name], NP .  aspirin EC tablet 325 mg, 325 mg, Oral, Daily, [name], MD, 325 mg at 08/23/16 1025 .  atorvastatin (LIPITOR) tablet 80 mg, 80 mg, Oral, q1800, [name], NP, 80 mg at 08/22/16 1844 .  cholecalciferol (VITAMIN D) tablet 2,000 Units, 2,000 Units, Oral, Daily, [name], NP, 2,000 Units at 08/23/16 1025 .  clopidogrel (PLAVIX) tablet 300 mg, 300 mg, Oral, Once, [name], NP .  clopidogrel (PLAVIX) tablet 75 mg, 75 mg, Oral, Daily, [name], NP, 75 mg at 08/23/16 1025 .  enoxaparin (LOVENOX) injection 40 mg, 40 mg, Subcutaneous, Q24H, [name], NP, 40 mg at 08/22/16 2023 .  hydrALAZINE (APRESOLINE) injection 5-10 mg, 5-10 mg, Intravenous, Q4H PRN, Waldemar Dickens, MD .  insulin aspart (novoLOG) injection 0-9 Units, 0-9 Units, Subcutaneous, Q4H, [name], NP, 1 Units at 08/22/16 0034 .  montelukast (SINGULAIR) tablet 10 mg, 10 mg, Oral, QHS, [name], NP, 10 mg at 08/22/16 2240 .  morphine 4 MG/ML injection 4 mg, 4 mg, Intravenous, Once, Charlesetta Shanks, MD, Stopped at 08/20/16 1446  Patients  Current Diet: Diet Heart Room service appropriate? Yes; Fluid consistency: Thin  Precautions / Restrictions Precautions Precautions: Fall Precaution Comments: Demonstrating decreased steadiness with ambulation with staggering to R. 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  She was fully independent and active and would like to get back her full independnce as soon as possible.   Home Assistive Devices / Equipment Home Assistive Devices/Equipment: None Home Equipment: Kasandra Knudsen - single point, Environmental consultant - 4 wheels, Grab bars - tub/shower, Wheelchair -  manual  Prior Device Use: Indicate devices/aids used by the patient prior to current illness, exacerbation or injury? None of the above  Prior Functional Level Prior Function Level of Independence: Independent Comments: full time hair dresser; driving; VERY independent  Self Care: Did the patient need help bathing, dressing, using the toilet or eating?  Independent  Indoor Mobility: Did the patient need assistance with walking from room to room (with or without device)? Independent  Stairs: Did the patient need assistance with internal or external stairs (with or without device)? Independent  Functional Cognition: Did the patient need help planning regular tasks such as shopping or remembering to take medications? Independent  Current Functional Level Cognition  Overall Cognitive Status: Within Functional Limits for tasks assessed Orientation Level: Oriented X4 General Comments: Pt is used to being independent, and so can display impulsivity trying to retain independence and especially when she feels her right side is impacting her ability to function    Extremity Assessment (includes  Sensation/Coordination)  Upper Extremity Assessment: RUE deficits/detail RUE Deficits / Details: decreased proprioception and control, sensation intact, strength WFL, shoulder with pain and rotator cuff pathology PTA RUE Coordination: decreased gross motor, decreased fine motor  Lower Extremity Assessment: Defer to PT evaluation    ADLs  Overall ADL's : Needs assistance/impaired Eating/Feeding: Supervision/ safety, Set up, Sitting Grooming: Min guard, Standing Upper Body Bathing: Minimal assistance, Standing Lower Body Bathing: Min guard, Sit to/from stand Upper Body Dressing : Sitting, Moderate assistance Lower Body Dressing: Min guard, Sit to/from stand Toilet Transfer: Minimal assistance, Ambulation, RW Toilet Transfer Details (indicate cue type and reason): VC's to slow down and for safety. Min assist due to leaning to the R. Toileting- Clothing Manipulation and Hygiene: Supervision/safety, Sitting/lateral lean Functional mobility during ADLs: Minimal assistance, Rolling walker General ADL Comments: Pt with significant R preference and running into items on the R.    Mobility  Overal bed mobility: Needs Assistance Bed Mobility: Supine to Sit Supine to sit: +2 for safety/equipment, Min guard (use of bedrails ) Sit to supine: Min guard General bed mobility comments: Close guard for safety as pt transitioned to full sitting position at EOB. Pt reports improvement in symptoms however continues to report "wooziness" and demonstrate R lateral lean.     Transfers  Overall transfer level: Needs assistance Equipment used: None Transfers: Sit to/from Stand Sit to Stand: Min assist General transfer comment: Required min A upon standing for steadying without use of RW. Verbal cues for hand placement for descent into chair.     Ambulation / Gait / Stairs / Wheelchair Mobility  Ambulation/Gait Ambulation/Gait assistance: Min assist, Mod assist Ambulation Distance (Feet): 50 Feet Assistive  device: None Gait Pattern/deviations: Step-through pattern, Decreased stride length, Staggering right, Drifts right/left, Trunk flexed, Wide base of support General Gait Details: Slow, cautious gait. Pt requiring min A for gait without AD secondary to decreased steadiness. Demonstrating drifting R during gait training and required verbal cues to maintain straight path.  Gait velocity: Decreased Gait velocity interpretation: Below normal speed for age/gender Stairs: Yes Stairs assistance: Mod assist Stair Management: No rails, Step to pattern, Forwards (HHA) Number of Stairs: 4 (2 x2) General stair comments: VC's for sequencing and safety. Min assist provided for ascending stairs, and mod assist provided for descending stairs.     Posture / Balance Dynamic Sitting Balance Sitting balance - Comments: Demonstrated R lateral lean and preference to the R. Balance Overall balance assessment: Needs assistance Sitting-balance support: No upper extremity supported, Feet  supported Sitting balance-Leahy Scale: Fair Sitting balance - Comments: Demonstrated R lateral lean and preference to the R. Standing balance support: No upper extremity supported Standing balance-Leahy Scale: Poor Standing balance comment: static standing High level balance activites: Braiding, Backward walking, Head turns High Level Balance Comments: Pt practiced higher level balance tasks above. Pt demonstrating LOB to R during all activities and required mod A to maintain balance. Pt reports feeling unsteady to the R and recognizes need for assist.  Standardized Balance Assessment Standardized Balance Assessment : Dynamic Gait Index Dynamic Gait Index Level Surface: Moderate Impairment Gait with Horizontal Head Turns: Moderate Impairment Gait with Vertical Head Turns: Moderate Impairment Step Over Obstacle: Moderate Impairment Step Around Obstacles: Mild Impairment    Special needs/care consideration BiPAP/CPAP: No CPM:  No Continuous Drip IV: No Dialysis: No         Life Vest: No Oxygen: No Special Bed: No Trach Size: No Wound Vac (area): No       Skin: WDL                               Bowel mgmt: PTA on 08/10/16, needs to be addressed Bladder mgmt: Continent  Diabetic mgmt: 5.8 managed with diet PTA     Previous Home Environment Living Arrangements: Spouse/significant other Available Help at Discharge: Family, Available 24 hours/day (daughter; 2 grandsons 12&14) Type of Home: House Home Layout: One level Home Access: Stairs to enter Entrance Stairs-Rails: None Technical brewer of Steps: 2 Bathroom Shower/Tub: Multimedia programmer: Standard Bathroom Accessibility: Yes How Accessible: Accessible via walker Additional Comments: 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  Discharge Living Setting Plans for Discharge Living Setting: Patient's home, Lives with (comment) (spouse) Type of Home at Discharge: House Discharge Home Layout: One level Discharge Home Access: Stairs to enter Entrance Stairs-Rails: None Entrance Stairs-Number of Steps: 2 Discharge Bathroom Shower/Tub: Walk-in shower Discharge Bathroom Toilet: Standard Discharge Bathroom Accessibility: Yes How Accessible: Accessible via walker Does the patient have any problems obtaining your medications?: No  Social/Family/Support Systems Patient Roles: Spouse Contact Information: Spouse: Impi Fichtner [phone] Anticipated Caregiver: Spouse and sister  Anticipated Caregiver's Contact Information: see above Ability/Limitations of Caregiver: None Caregiver Availability: 24/7 Discharge Plan Discussed with Primary Caregiver: Yes Is Caregiver In Agreement with Plan?: Yes Does Caregiver/Family have Issues with Lodging/Transportation while Pt is in Rehab?: No  Goals/Additional Needs Patient/Family Goal for Rehab: PT/OT Mod I-Supervision  Expected length of stay: 7-10 days  Cultural Considerations:  None Dietary Needs: Heart Healthy and Carb. Mod. Equipment Needs: TBD Special Service Needs: None Additional Information: Patient to admit 3/2 after Loop Recorder Placement  Pt/Family Agrees to Admission and willing to participate: Yes Program Orientation Provided & Reviewed with Pt/Caregiver Including Roles  & Responsibilities: Yes Additional Information Needs: Patient report managing her diabetes with diet prior to admission but reports that she has been recieving insulin sliding scale since admission. Information Needs to be Provided By: MD, PA, RN   Decrease burden of Care through IP rehab admission: No  Possible need for SNF placement upon discharge: No  Patient Condition: This patient's medical and functional status has changed since the consult dated: 08/21/16 in which the Rehabilitation Physician determined and documented that the patient's condition is appropriate for intensive rehabilitative care in an inpatient rehabilitation facility. See "History of Present Illness" (above) for medical update. Functional changes are: Min-Mod assist gait with loss of balance. Patient's  medical and functional status update has been discussed with the Rehabilitation physician and patient remains appropriate for inpatient rehabilitation. Will admit to inpatient rehab today.  Preadmission Screen Completed By:  Gunnar Fusi, 08/23/2016 2:04 PM ______________________________________________________________________   Discussed status with Dr. Posey Pronto on 08/23/16 at 1420 and received telephone approval for admission today.  Admission Coordinator:  Gunnar Fusi, time 1420/Date 08/23/16

## 2016-08-23 NOTE — Telephone Encounter (Signed)
Left message with patient on voicemail.  Spoke with daughter/Liza, suffered CVA.  Approved inpatient rehab. Has full function; when PT tried to walk pt, falling to the RIGHT.  To have Loop Recorder inserted.

## 2016-08-23 NOTE — Progress Notes (Signed)
[facility] Admission Coordinator Signed Physical Medicine and Rehabilitation  PMR Pre-admission Date of Service: 08/23/2016 2:04 PM  Related encounter: ED to Hosp-Admission (Discharged) from 08/20/2016 in [place]       [] Hide copied text PMR Admission Coordinator Pre-Admission Assessment  Patient: Elaine Jackson is an 61 y.o., female MRN: [id number] DOB: Jun 27, 1955 Height:  5'1.75" Weight:                                                                                                                                                    Insurance Information HMO:     PPO: X     PCP:      IPA:      80/20:      OTHER:  PRIMARY: BCBS      Policy#: [id number]      Subscriber: Rule Name: Celso Amy      Phone#: [phone]     Fax#: [phone] Pre-Cert#: [id number]      Employer: Self-Employed  Benefits:  Phone #: [phone]     Name: Online Eff. 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 She was admitted on 08/20/16 and reported constant HA radiating from right temporal  area to periorbital and right posterior neck as well as photophobia. UDS negative. CT head without acute abnormality. CTA head/neck done revealing tapering of right VA which becomes occluded at C5 concerning for dissection, non opacification of R-ACA and posterior inferior PCA compatible Posterior fossa ischemia. MRI/MRA brain done revealing multiple small foci of ischemia in right cerebellar hemisphere within PICA territory, occlusion of visualized portion of R-VA and right PICA. Neurology recommended adding Plavix to ASA for infarcts due to large vessel source and 30 day cardiac event monitoring recommended to  rule out A fib due to reports of palpitations at home.   Cardiology consulted due to bradycardia with HR in 30's and felt that worsening of chronic bradycardia likely due to compression of vagus nerve from vertebral dissection and felt that there was no indications for PPM at this time. Dr. Claiborne Billings recommends long term use of low dose ASA and Plavix. 2 D echo revealed EF [id number], grade 1 diastolic dysfunction and no wall abnormality. Loop recorder to be placed today. Therapy ongoing and and patient with staggering gait and poor safety awareness. CIR recommended for follow up therapy.   NIH Total: 2  Past Medical History      Past Medical History:  Diagnosis Date  . Allergic rhinitis, cause unspecified   . Allergy   . CAD (coronary artery disease)    LHC 12/20/2014 showed small to moderate apical infarction due to distal LAD stenosis with reestablished TIMI2 flow. No PCI. Recommended plavix for 1 yr and ASA for life.   . Chicken pox   . Diabetes mellitus   . Family history of breast cancer in first degree relative    sister age 25  . Heart murmur   . Hyperlipidemia   . Hypertension   . Malignant neoplasm skin of face 02/16/2007   Basal Cell Carcinoma  [place].  . Measles   . Obesity, unspecified   . Other chronic nonalcoholic liver disease   . Sleep related leg  cramps   . Unspecified vitamin D deficiency     Family History  family history includes Arthritis in her brother; Breast cancer (age of onset: 5) in her sister; Cervical cancer in her sister; Congestive Heart Failure in her father and mother; Dementia in her mother; Diabetes in her brother, father, mother, and sister; Heart disease in her father, maternal grandfather, mother, paternal grandfather, and paternal grandmother; Hepatitis C (age of onset: 76) in her sister; Hyperlipidemia in her brother, father, and mother; Hypertension in her brother, father, mother, sister, and sister; Kidney disease in her father and sister; Kidney failure in her father; Liver cancer (age of onset: 3) in her sister; Stroke in her father, maternal grandmother, paternal grandfather, and paternal grandmother.  Prior Rehab/Hospitalizations:  Has the patient had major surgery during 100 days prior to admission? No  Current Medications   Current Facility-Administered Medications:  .   stroke: mapping our early stages of recovery book, , Does not apply, Once, [name], NP .  acetaminophen (TYLENOL) tablet 650 mg, 650 mg, Oral, Q4H PRN, 650 mg at 08/21/16 2047 **OR** acetaminophen (TYLENOL) solution 650 mg, 650 mg, Per Tube, Q4H PRN **OR** acetaminophen (TYLENOL) suppository 650 mg, 650 mg, Rectal, Q4H PRN, [name], NP .  aspirin EC tablet 325 mg, 325 mg, Oral, Daily, [name], MD, 325 mg at 08/23/16 1025 .  atorvastatin (LIPITOR) tablet 80 mg, 80 mg, Oral, q1800, [name], NP, 80 mg at 08/22/16 1844 .  cholecalciferol (VITAMIN D) tablet 2,000 Units, 2,000 Units, Oral, Daily, [name], NP, 2,000 Units at 08/23/16 1025 .  clopidogrel (PLAVIX) tablet 300 mg, 300 mg, Oral, Once, [name], NP .  clopidogrel (PLAVIX) tablet 75 mg, 75 mg, Oral, Daily, [name], NP, 75 mg at 08/23/16 1025 .  enoxaparin (LOVENOX) injection 40 mg, 40 mg, Subcutaneous, Q24H, [name], NP,  40 mg at 08/22/16 2023 .  hydrALAZINE (APRESOLINE) injection 5-10 mg, 5-10 mg, Intravenous, Q4H PRN, Waldemar Dickens, MD .  insulin aspart (  novoLOG) injection 0-9 Units, 0-9 Units, Subcutaneous, Q4H, [name], NP, 1 Units at 08/22/16 0034 .  montelukast (SINGULAIR) tablet 10 mg, 10 mg, Oral, QHS, [name], NP, 10 mg at 08/22/16 2240 .  morphine 4 MG/ML injection 4 mg, 4 mg, Intravenous, Once, Charlesetta Shanks, MD, Stopped at 08/20/16 1446  Patients Current Diet: Diet Heart Room service appropriate? Yes; Fluid consistency: Thin  Precautions / Restrictions Precautions Precautions: Fall Precaution Comments: Demonstrating decreased steadiness with ambulation with staggering to R. 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  She was fully independent and active and would like to get back her full independnce as soon as possible.   Home Assistive Devices / Equipment Home Assistive Devices/Equipment: None Home Equipment: Cane - single point, Environmental consultant - 4 wheels, Grab bars - tub/shower, Wheelchair - manual  Prior Device Use: Indicate devices/aids used by the patient prior to current illness, exacerbation or injury? None of the above  Prior Functional Level Prior Function Level of Independence: Independent Comments: full time hair dresser; driving; VERY independent  Self Care: Did the patient need help bathing, dressing, using the toilet or eating?  Independent  Indoor Mobility: Did the patient need assistance with walking from room to room (with or without device)? Independent  Stairs: Did the patient need assistance with internal or external stairs (with or without device)? Independent  Functional Cognition: Did the patient need help  planning regular tasks such as shopping or remembering to take medications? Independent  Current Functional Level Cognition  Overall Cognitive Status: Within Functional Limits for tasks assessed Orientation Level: Oriented X4 General Comments: Pt is used to being independent, and so can display impulsivity trying to retain independence and especially when she feels her right side is impacting her ability to function    Extremity Assessment (includes Sensation/Coordination)  Upper Extremity Assessment: RUE deficits/detail RUE Deficits / Details: decreased proprioception and control, sensation intact, strength WFL, shoulder with pain and rotator cuff pathology PTA RUE Coordination: decreased gross motor, decreased fine motor  Lower Extremity Assessment: Defer to PT evaluation    ADLs  Overall ADL's : Needs assistance/impaired Eating/Feeding: Supervision/ safety, Set up, Sitting Grooming: Min guard, Standing Upper Body Bathing: Minimal assistance, Standing Lower Body Bathing: Min guard, Sit to/from stand Upper Body Dressing : Sitting, Moderate assistance Lower Body Dressing: Min guard, Sit to/from stand Toilet Transfer: Minimal assistance, Ambulation, RW Toilet Transfer Details (indicate cue type and reason): VC's to slow down and for safety. Min assist due to leaning to the R. Toileting- Clothing Manipulation and Hygiene: Supervision/safety, Sitting/lateral lean Functional mobility during ADLs: Minimal assistance, Rolling walker General ADL Comments: Pt with significant R preference and running into items on the R.    Mobility  Overal bed mobility: Needs Assistance Bed Mobility: Supine to Sit Supine to sit: +2 for safety/equipment, Min guard (use of bedrails ) Sit to supine: Min guard General bed mobility comments: Close guard for safety as pt transitioned to full sitting position at EOB. Pt reports improvement in symptoms however continues to report "wooziness" and demonstrate  R lateral lean.     Transfers  Overall transfer level: Needs assistance Equipment used: None Transfers: Sit to/from Stand Sit to Stand: Min assist General transfer comment: Required min A upon  standing for steadying without use of RW. Verbal cues for hand placement for descent into chair.     Ambulation / Gait / Stairs / Wheelchair Mobility  Ambulation/Gait Ambulation/Gait assistance: Min assist, Mod assist Ambulation Distance (Feet): 50 Feet Assistive device: None Gait Pattern/deviations: Step-through pattern, Decreased stride length, Staggering right, Drifts right/left, Trunk flexed, Wide base of support General Gait Details: Slow, cautious gait. Pt requiring min A for gait without AD secondary to decreased steadiness. Demonstrating drifting R during gait training and required verbal cues to maintain straight path.  Gait velocity: Decreased Gait velocity interpretation: Below normal speed for age/gender Stairs: Yes Stairs assistance: Mod assist Stair Management: No rails, Step to pattern, Forwards (HHA) Number of Stairs: 4 (2 x2) General stair comments: VC's for sequencing and safety. Min assist provided for ascending stairs, and mod assist provided for descending stairs.     Posture / Balance Dynamic Sitting Balance Sitting balance - Comments: Demonstrated R lateral lean and preference to the R. Balance Overall balance assessment: Needs assistance Sitting-balance support: No upper extremity supported, Feet supported Sitting balance-Leahy Scale: Fair Sitting balance - Comments: Demonstrated R lateral lean and preference to the R. Standing balance support: No upper extremity supported Standing balance-Leahy Scale: Poor Standing balance comment: static standing High level balance activites: Braiding, Backward walking, Head turns High Level Balance Comments: Pt practiced higher level balance tasks above. Pt demonstrating LOB to R during all activities and required mod A to  maintain balance. Pt reports feeling unsteady to the R and recognizes need for assist.  Standardized Balance Assessment Standardized Balance Assessment : Dynamic Gait Index Dynamic Gait Index Level Surface: Moderate Impairment Gait with Horizontal Head Turns: Moderate Impairment Gait with Vertical Head Turns: Moderate Impairment Step Over Obstacle: Moderate Impairment Step Around Obstacles: Mild Impairment    Special needs/care consideration BiPAP/CPAP: No CPM: No Continuous Drip IV: No Dialysis: No         Life Vest: No Oxygen: No Special Bed: No Trach Size: No Wound Vac (area): No       Skin: WDL                               Bowel mgmt: PTA on 08/10/16, needs to be addressed Bladder mgmt: Continent  Diabetic mgmt: 5.8 managed with diet PTA     Previous Home Environment Living Arrangements: Spouse/significant other Available Help at Discharge: Family, Available 24 hours/day (daughter; 2 grandsons 12&14) Type of Home: House Home Layout: One level Home Access: Stairs to enter Entrance Stairs-Rails: None Technical brewer of Steps: 2 Bathroom Shower/Tub: Multimedia programmer: Standard Bathroom Accessibility: Yes How Accessible: Accessible via walker Additional Comments: 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  Discharge Living Setting Plans for Discharge Living Setting: Patient's home, Lives with (comment) (spouse) Type of Home at Discharge: House Discharge Home Layout: One level Discharge Home Access: Stairs to enter Entrance Stairs-Rails: None Entrance Stairs-Number of Steps: 2 Discharge Bathroom Shower/Tub: Walk-in shower Discharge Bathroom Toilet: Standard Discharge Bathroom Accessibility: Yes How Accessible: Accessible via walker Does the patient have any problems obtaining your medications?: No  Social/Family/Support Systems Patient Roles: Spouse Contact Information: Spouse: Lillith Fleischer [phone] Anticipated  Caregiver: Spouse and sister  Anticipated Caregiver's Contact Information: see above Ability/Limitations of Caregiver: None Caregiver Availability: 24/7 Discharge Plan Discussed with Primary Caregiver: Yes Is Caregiver In Agreement with Plan?: Yes Does Caregiver/Family have Issues with Lodging/Transportation while Pt is in  Rehab?: No  Goals/Additional Needs Patient/Family Goal for Rehab: PT/OT Mod I-Supervision  Expected length of stay: 7-10 days  Cultural Considerations: None Dietary Needs: Heart Healthy and Carb. Mod. Equipment Needs: TBD Special Service Needs: None Additional Information: Patient to admit 3/2 after Loop Recorder Placement  Pt/Family Agrees to Admission and willing to participate: Yes Program Orientation Provided & Reviewed with Pt/Caregiver Including Roles  & Responsibilities: Yes Additional Information Needs: Patient report managing her diabetes with diet prior to admission but reports that she has been recieving insulin sliding scale since admission. Information Needs to be Provided By: MD, PA, RN   Decrease burden of Care through IP rehab admission: No  Possible need for SNF placement upon discharge: No  Patient Condition: This patient's medical and functional status has changed since the consult dated: 08/21/16 in which the Rehabilitation Physician determined and documented that the patient's condition is appropriate for intensive rehabilitative care in an inpatient rehabilitation facility. See "History of Present Illness" (above) for medical update. Functional changes are: Min-Mod assist gait with loss of balance. Patient's medical and functional status update has been discussed with the Rehabilitation physician and patient remains appropriate for inpatient rehabilitation. Will admit to inpatient rehab today.  Preadmission Screen Completed By:  Gunnar Fusi, 08/23/2016 2:04 PM ______________________________________________________________________   Discussed  status with Dr. Posey Pronto on 08/23/16 at 1420 and received telephone approval for admission today.  Admission Coordinator:  Gunnar Fusi, time 1420/Date 08/23/16       Cosigned by: Ankit Lorie Phenix, MD at 08/23/2016 2:40 PM  Revision History

## 2016-08-23 NOTE — Progress Notes (Signed)
Pt admitted to unit at 1650 with husband and brother at bedside. RN reviewed rehab booklet and process with pt and family. Verbal understanding with safety plan and plan of care. Family at bedside,call bell within reach.

## 2016-08-23 NOTE — Progress Notes (Signed)
PROGRESS NOTE    Elaine Jackson  [id number] DOB: 03/31/1956 DOA: 08/20/2016 PCP: Reginia Forts, MD   Brief Narrative:  Pt has no new complaints. No acute issues overnight.  Assessment & Plan:   Principal Problem:   Vertebral artery dissection +acute CVA: right PICA territory infarcts in setting of R VA and R PICA occlusion.  Infarcts secondary to large vessel source - Neurology managed while patient in house. - Awaiting admission into CIR and loop recorder insertion. Cardiology on board and making plans.  Active Problems:   Acute kidney injury  -Baseline renal function: 22/0.91 -Current renal function: 35/1.2    Left thyroid nodule - Incidental finding on CTA neck - TSH within normal limits    Bradycardia -Evaluated by cardiology in the ER; bradycardia determined to be chronic in nature and asymptomatic - echocardiogram pending    Abnormal urinalysis -Asymptomatic -Check urine culture    Diabetes mellitus type II, controlled  -Diet controlled at home -Follow CBGs -SSI -HgbA1c as above    Essential hypertension, benign -Allowing for permissive hypertension (see above)    Coronary artery disease involving native coronary artery of native heart without angina pectoris -Asymptomatic -On statin and aspirin prior to admission -No beta blocker secondary to bradycardia    Hx of laparoscopic gastric banding    Dyslipidemia -Continue statin  DVT prophylaxis: Lovenox Code Status: Full Family Communication: d/c with family member at bedside Disposition Plan: Once cleared for d/c by cardiology   Consultants:   Cardiology  Neurology   Procedures: none   Antimicrobials: none   Subjective: Pt has no new complaints.  Objective: Vitals:   08/22/16 2044 08/23/16 0003 08/23/16 0518 08/23/16 0835  BP: (!) 129/52 (!) 109/37 122/65 (!) 116/51  Pulse: (!) 56 63 83 (!) 55  Resp: 16 16 16 16   Temp: 98.5 F (36.9 C) 98.5 F (36.9 C) 98.6 F (37 C)  98.3 F (36.8 C)  TempSrc: Oral Oral Oral Oral  SpO2: 98% 96% 97% 97%    Intake/Output Summary (Last 24 hours) at 08/23/16 1321 Last data filed at 08/23/16 0700  Gross per 24 hour  Intake              480 ml  Output                0 ml  Net              480 ml   There were no vitals filed for this visit.  Examination:  General exam: Appears calm and comfortable, in nad. Respiratory system: Clear to auscultation. Respiratory effort normal. Cardiovascular system: S1 & S2 heard, RRR. No JVD, murmurs, rubs, gallops or clicks. No pedal edema. Gastrointestinal system: Abdomen is nondistended, soft and nontender. No organomegaly or masses felt. Normal bowel sounds heard. Central nervous system: Alert and oriented. No facial asymmetry Extremities: no cyanosis Skin: No rashes, lesions or ulcers on limited exam. Psychiatry: Mood & affect appropriate.   Data Reviewed: I have personally reviewed following labs and imaging studies  CBC:  Recent Labs Lab 08/20/16 1127 08/20/16 1155  WBC 9.0  --   NEUTROABS 5.1  --   HGB 12.2 12.6  HCT 37.8 37.0  MCV 93.6  --   PLT 243  --    Basic Metabolic Panel:  Recent Labs Lab 08/20/16 1127 08/20/16 1155  NA 137 139  K 4.4 4.4  CL 102 102  CO2 22  --   GLUCOSE 177* 181*  BUN 26* 35*  CREATININE 1.13* 1.20*  CALCIUM 9.8  --    GFR: CrCl cannot be calculated (Unknown ideal weight.). Liver Function Tests: No results for input(s): AST, ALT, ALKPHOS, BILITOT, PROT, ALBUMIN in the last 168 hours. No results for input(s): LIPASE, AMYLASE in the last 168 hours. No results for input(s): AMMONIA in the last 168 hours. Coagulation Profile:  Recent Labs Lab 08/20/16 1127  INR 0.97   Cardiac Enzymes: No results for input(s): CKTOTAL, CKMB, CKMBINDEX, TROPONINI in the last 168 hours. BNP (last 3 results) No results for input(s): PROBNP in the last 8760 hours. HbA1C:  Recent Labs  08/21/16 0147  HGBA1C 5.8*   CBG:  Recent  Labs Lab 08/22/16 2053 08/22/16 2330 08/23/16 0404 08/23/16 0747 08/23/16 1141  GLUCAP 131* 118* 123* 113* 120*   Lipid Profile:  Recent Labs  08/21/16 0147  CHOL 128  HDL 53  LDLCALC 64  TRIG 56  CHOLHDL 2.4   Thyroid Function Tests:  Recent Labs  08/20/16 1853  TSH 0.564  FREET4 1.09   Anemia Panel: No results for input(s): VITAMINB12, FOLATE, FERRITIN, TIBC, IRON, RETICCTPCT in the last 72 hours. Sepsis Labs: No results for input(s): PROCALCITON, LATICACIDVEN in the last 168 hours.  Recent Results (from the past 240 hour(s))  Culture, Urine     Status: Abnormal   Collection Time: 08/20/16 12:25 PM  Result Value Ref Range Status   Specimen Description URINE, CLEAN CATCH  Final   Special Requests NONE  Final   Culture MULTIPLE SPECIES PRESENT, SUGGEST RECOLLECTION (A)  Final   Report Status 08/22/2016 FINAL  Final      Radiology Studies: No results found.  Scheduled Meds: .  stroke: mapping our early stages of recovery book   Does not apply Once  . aspirin EC  325 mg Oral Daily  . atorvastatin  80 mg Oral q1800  . cholecalciferol  2,000 Units Oral Daily  . clopidogrel  300 mg Oral Once  . clopidogrel  75 mg Oral Daily  . enoxaparin (LOVENOX) injection  40 mg Subcutaneous Q24H  . insulin aspart  0-9 Units Subcutaneous Q4H  . montelukast  10 mg Oral QHS  .  morphine injection  4 mg Intravenous Once   Continuous Infusions:   LOS: 2 days    Time spent: > 35 minutes  Velvet Bathe, MD [facility] Pager [phone]  If 7PM-7AM, please contact night-coverage [email or web address] Password [email or web address] 08/23/2016, 1:21 PM

## 2016-08-23 NOTE — Progress Notes (Signed)
Elaine Blake, MD Physician Signed Physical Medicine and Rehabilitation  Consult Note Date of Service: 08/21/2016 11:47 AM  Related encounter: ED to Hosp-Admission (Discharged) from 08/20/2016 in [facility] All Collapse All   [] Hide copied text [] Hover for attribution information      Physical Medicine and Rehabilitation Consult Reason for Consult: Right cerebellar hemisphere infarct with vertebral artery dissection Referring Physician: [facility]   HPI: Elaine Jackson is a 61 y.o. right handed female with history of CAD status post apical MI in June 2016 maintained on aspirin and Plavix, hypertension, diabetes mellitus, chronic bradycardia, prior laparoscopic gastric banding. Per chart review patient lives with spouse. Independent prior to admission and works as a Theme park manager. One level home with 2 steps to entry. Husband can assist as needed. Presented 08/20/2016 with dizziness/headache, nausea, slurred speech. Cranial CT scan negative. CT angiogram head and neck showed tapering of the right vertebral artery which becomes occluded at the level of C5 concerning for dissection. Patient did not receive TPA. Troponin -0.01. Urine drug screen negative. MRI/MRA of the brain showed multiple small foci of acute ischemia within the right cerebellar hemisphere, within the PICA territory. Old left cerebellar infarct. 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 Echocardiogram is pending. Cardiology consulted for persistent bradycardia which she has a history of into the high 30s and no indication for pacemaker at this time. Currently remains on aspirin and Plavix for CVA prophylaxis. Subcutaneous Lovenox for DVT prophylaxis. Tolerating a regular consistency diet. Physical therapy evaluation completed 08/21/2016 with recommendations of physical medicine rehabilitation consult.  Patient still feels dizzy  when she gets out of bed. She vomited when going to the bathroom this morning. She does not feel dizzy when lying in bed anymore. No nausea, but still has poor appetite  Review of Systems  Constitutional: Negative for chills and fever.  HENT: Negative for tinnitus.   Eyes: Negative for blurred vision and double vision.  Respiratory: Negative for cough and shortness of breath.   Cardiovascular: Positive for palpitations. Negative for chest pain and leg swelling.  Gastrointestinal: Positive for nausea.  Genitourinary: Negative for dysuria and hematuria.  Musculoskeletal: Positive for myalgias.  Skin: Negative for rash.  Neurological: Positive for dizziness, speech change and headaches. Negative for seizures.  All other systems reviewed and are negative.      Past Medical History:  Diagnosis Date  . Allergic rhinitis, cause unspecified   . Allergy   . CAD (coronary artery disease)    LHC 12/20/2014 showed small to moderate apical infarction due to distal LAD stenosis with reestablished TIMI2 flow. No PCI. Recommended plavix for 1 yr and ASA for life.   . Chicken pox   . Diabetes mellitus   . Family history of breast cancer in first degree relative    sister age 30  . Heart murmur   . Hyperlipidemia   . Hypertension   . Malignant neoplasm skin of face 02/16/2007   Basal Cell Carcinoma  [place].  . Measles   . Obesity, unspecified   . Other chronic nonalcoholic liver disease   . Sleep related leg cramps   . Unspecified vitamin D deficiency         Past Surgical History:  Procedure Laterality Date  . CARDIAC CATHETERIZATION N/A 12/20/2014   Procedure: Left Heart Cath and Coronary Angiography;  Surgeon: Sanda Klein, MD;  Location: [facility];  Service: Cardiovascular;  Laterality: N/A;  . CHOLECYSTECTOMY    . COLONOSCOPY  06/25/2007   [place]. Normal. Repeat in ten years.  Marland Kitchen LAPAROSCOPIC GASTRIC BANDING  07/03/10   Dr. Sherrin Daisy; [facility]  .  TONSILLECTOMY           Family History  Problem Relation Age of Onset  . Diabetes Mother   . Heart disease Mother     defibrillator; carotid artery stenosis.  . Hypertension Mother   . Hyperlipidemia Mother   . Dementia Mother   . Congestive Heart Failure Mother   . Heart disease Father   . Kidney failure Father     ESRD/peritoneal dialysis  . Diabetes Father   . Hypertension Father   . Hyperlipidemia Father   . Stroke Father   . Kidney disease Father   . Congestive Heart Failure Father   . Diabetes Sister   . Hypertension Sister   . Kidney disease Sister     renal failure  . Diabetes Brother   . Hypertension Brother   . Hyperlipidemia Brother   . Arthritis Brother   . Cervical cancer Sister   . Hypertension Sister   . Liver cancer Sister 40  . Stroke Maternal Grandmother   . Heart disease Maternal Grandfather   . Stroke Paternal Grandmother   . Heart disease Paternal Grandmother   . Heart disease Paternal Grandfather   . Stroke Paternal Grandfather   . Breast cancer Sister 65  . Hepatitis C Sister 86   Social History:  reports that she has quit smoking. Her smoking use included Cigarettes. She has a 25.00 pack-year smoking history. She has never used smokeless tobacco. She reports that she does not drink alcohol or use drugs. Allergies:      Allergies  Allergen Reactions  . Sulfa Antibiotics Nausea And Vomiting         Medications Prior to Admission  Medication Sig Dispense Refill  . aspirin EC 81 MG EC tablet Take 1 tablet (81 mg total) by mouth daily.    Marland Kitchen atorvastatin (LIPITOR) 80 MG tablet TAKE 1 TABLET (80 MG TOTAL) BY MOUTH DAILY AT 6 PM. 90 tablet 3  . cholecalciferol (VITAMIN D) 1000 UNITS tablet Take 1,000 Units by mouth daily.     . Cyanocobalamin (VITAMIN B-12 PO) Take 1 tablet by mouth daily.    . fluticasone (FLONASE) 50 MCG/ACT nasal spray Place 2 sprays into both nostrils daily as needed. For  allergies  11  . Lancets (ONETOUCH ULTRASOFT) lancets Use as instructed -- check sugar once daily One touch ultra 2 100 each 3  . losartan-hydrochlorothiazide (HYZAAR) 100-25 MG tablet Take 1 tablet by mouth daily. 90 tablet 3  . montelukast (SINGULAIR) 10 MG tablet TAKE 1 TABLET (10 MG TOTAL) BY MOUTH AT BEDTIME. 90 tablet 3  . nitroGLYCERIN (NITROSTAT) 0.4 MG SL tablet Place 1 tablet (0.4 mg total) under the tongue every 5 (five) minutes x 3 doses as needed for chest pain. 25 tablet 3  . ONE TOUCH ULTRA TEST test strip USE TO TEST BLOOD SUGAR ONCE DAILY. E11.9 100 each 0  . clopidogrel (PLAVIX) 75 MG tablet Take 1 tablet (75 mg total) by mouth daily. (Patient not taking: Reported on 05/01/2016) 90 tablet 3    Home: [place] expects to be discharged to:: Private residence Living Arrangements: Spouse/significant other Available Help at Discharge: Family, Available 24 hours/day (daughter; 2 grandsons 12&14) Type of Home: House Home Access: Stairs to enter [employer] of  Steps: 2 Entrance Stairs-Rails: None Home Layout: One level Bathroom Shower/Tub: Multimedia programmer: Standard Bathroom Accessibility: Yes Home Equipment: Cane - single point, Environmental consultant - 4 wheels, Grab bars - tub/shower, Wheelchair - manual Additional Comments: 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 Reports she feels like she is dizzy and is leaning to the R. Instructed pt to have seated rest break to decrease symptoms.  Transfers Overall  transfer level: Needs assistance Equipment used: 1 person hand held assist Transfers: Sit to/from Stand Sit to Stand: Min assist General transfer comment: Required min A for steadying once standing.  Ambulation/Gait Ambulation/Gait assistance: Mod assist, +2 safety/equipment Ambulation Distance (Feet): 10 Feet Assistive device: 1 person hand held assist, Rolling walker (2 wheeled) Gait Pattern/deviations: Step-through pattern, Decreased stride length, Staggering right, Drifts right/left, Wide base of support, Trunk flexed General Gait Details: Attempted 1 step with HHA and pt demonstrated LOB to R requiring mod A +2 for regaining balance. Ambulated with RW 10' and pt demonstrating decreased steadiness requiring min-mod A. Required manual assist on L side of walker to prevent tipping to the R. Demonstrated R lateral lean. Gave verbal cues to look straight ahead to help with dizziness. Pt reports nausea with ambulation which limited distance. Pt requested return to bed secondary to nausea. Notified NT about nausea symptoms.  Gait velocity: Decreased Gait velocity interpretation: Below normal speed for age/gender  ADL: ADL Overall ADL's : Needs assistance/impaired  Cognition: Cognition Overall Cognitive Status: Within Functional Limits for tasks assessed Orientation Level: Oriented X4 Cognition Arousal/Alertness: Awake/alert Behavior During Therapy: WFL for tasks assessed/performed, Impulsive Overall Cognitive Status: Within Functional Limits for tasks assessed General Comments: Pt is used to being independent, and so can display impulsivity trying to retain independence and especially when she feels her right side is impacting her ability to function  Blood pressure (!) 111/48, pulse (!) 57, temperature 98.2 F (36.8 C), temperature source Oral, resp. rate 19, SpO2 100 %. Physical Exam  Vitals reviewed. Constitutional: She is oriented to person, place, and time. She appears  well-developed.  HENT:  Head: Normocephalic.  Eyes: EOM are normal. Left eye exhibits no discharge.  Neck: Normal range of motion. Neck supple. No thyromegaly present.  Cardiovascular:  Cardiac rate controlled 58 bpm  Respiratory: Effort normal and breath sounds normal. No respiratory distress.  GI: Soft. Bowel sounds are normal. She exhibits no distension.  Neurological: She is alert and oriented to person, place, and time.  Follows full commands. Good awareness of deficits  Skin: Skin is warm and dry.  Mild-to-moderate dysmetria, right finger-nose-finger, no dysmetria noted with heel-to-shin. Normal finger-nose-finger. Left side. Motor strength is 4/5 in the right deltoid by suppressed grip 5/5 in the left deltoid, biceps, triceps, grip 5/5 bilateral hip flexor, knee extensor, ankle dorsal flexor. Sensation intact to light touch bilateral upper and lower limbs. Extraocular motions intact, no evidence of nystagmus  Lab Results Last 24 Hours       Results for orders placed or performed during the hospital encounter of 08/20/16 (from the past 24 hour(s))  I-Stat Chem 8, ED  (not at [facility], [facility])  Status: Abnormal   Collection Time: 08/20/16 11:55 AM  Result Value Ref Range   Sodium 139 135 - 145 mmol/L   Potassium 4.4 3.5 - 5.1 mmol/L   Chloride 102 101 - 111 mmol/L   BUN 35 (H) 6 - 20 mg/dL   Creatinine, Ser 1.20 (H) 0.44 - 1.00 mg/dL   Glucose, Bld 181 (H) 65 - 99 mg/dL   Calcium, Ion 1.19 1.15 - 1.40 mmol/L   TCO2 28 0 - 100 mmol/L   Hemoglobin 12.6 12.0 - 15.0 g/dL   HCT 37.0 36.0 - 46.0 %  Ethanol     Status: None   Collection Time: 08/20/16 12:05 PM  Result Value Ref Range   Alcohol, Ethyl (B) <5 <5 mg/dL  Urinalysis, Routine w reflex microscopic     Status: Abnormal   Collection Time: 08/20/16 12:25 PM  Result Value Ref Range   Color, Urine YELLOW YELLOW   APPearance CLEAR CLEAR   Specific Gravity, Urine 1.014 1.005 - 1.030   pH 5.0 5.0 - 8.0    Glucose, UA NEGATIVE NEGATIVE mg/dL   Hgb urine dipstick NEGATIVE NEGATIVE   Bilirubin Urine NEGATIVE NEGATIVE   Ketones, ur NEGATIVE NEGATIVE mg/dL   Protein, ur NEGATIVE NEGATIVE mg/dL   Nitrite NEGATIVE NEGATIVE   Leukocytes, UA SMALL (A) NEGATIVE   RBC / HPF 0-5 0 - 5 RBC/hpf   WBC, UA 6-30 0 - 5 WBC/hpf   Bacteria, UA RARE (A) NONE SEEN   Squamous Epithelial / LPF 0-5 (A) NONE SEEN   Non Squamous Epithelial 0-5 (A) NONE SEEN  Urine rapid drug screen (hosp performed)not at [facility]     Status: None   Collection Time: 08/20/16 12:25 PM  Result Value Ref Range   Opiates NONE DETECTED NONE DETECTED   Cocaine NONE DETECTED NONE DETECTED   Benzodiazepines NONE DETECTED NONE DETECTED   Amphetamines NONE DETECTED NONE DETECTED   Tetrahydrocannabinol NONE DETECTED NONE DETECTED   Barbiturates NONE DETECTED NONE DETECTED  CBG monitoring, ED     Status: Abnormal   Collection Time: 08/20/16  3:05 PM  Result Value Ref Range   Glucose-Capillary 136 (H) 65 - 99 mg/dL   Comment 1 Notify RN    Comment 2 Document in Chart   TSH     Status: None   Collection Time: 08/20/16  6:53 PM  Result Value Ref Range   TSH 0.564 0.350 - 4.500 uIU/mL  T4, free     Status: None   Collection Time: 08/20/16  6:53 PM  Result Value Ref Range   Free T4 1.09 0.61 - 1.12 ng/dL  Glucose, capillary     Status: Abnormal   Collection Time: 08/20/16  9:03 PM  Result Value Ref Range   Glucose-Capillary 148 (H) 65 - 99 mg/dL  Glucose, capillary     Status: Abnormal   Collection Time: 08/21/16 12:40 AM  Result Value Ref Range   Glucose-Capillary 174 (H) 65 - 99 mg/dL   Comment 1 Notify RN    Comment 2 Document in Chart   Lipid panel     Status: None   Collection Time: 08/21/16  1:47 AM  Result Value Ref Range   Cholesterol 128 0 - 200 mg/dL   Triglycerides 56 <150 mg/dL   HDL 53 >40 mg/dL   Total CHOL/HDL Ratio 2.4 RATIO   VLDL 11 0 - 40 mg/dL   LDL Cholesterol 64 0 -  99 mg/dL  Glucose, capillary     Status: Abnormal  Collection Time: 08/21/16  4:09 AM  Result Value Ref Range   Glucose-Capillary 148 (H) 65 - 99 mg/dL   Comment 1 Notify RN    Comment 2 Document in Chart   Glucose, capillary     Status: Abnormal   Collection Time: 08/21/16  7:35 AM  Result Value Ref Range   Glucose-Capillary 144 (H) 65 - 99 mg/dL  Glucose, capillary     Status: Abnormal   Collection Time: 08/21/16 11:25 AM  Result Value Ref Range   Glucose-Capillary 140 (H) 65 - 99 mg/dL      Imaging Results (Last 48 hours)  Ct Angio Head W Or Wo Contrast  Result Date: 08/20/2016 CLINICAL DATA:  Headache and vertigo EXAM: CT ANGIOGRAPHY HEAD AND NECK TECHNIQUE: Multidetector CT imaging of the head and neck was performed using the standard protocol during bolus administration of intravenous contrast. Multiplanar CT image reconstructions and MIPs were obtained to evaluate the vascular anatomy. Carotid stenosis measurements (when applicable) are obtained utilizing NASCET criteria, using the distal internal carotid diameter as the denominator. CONTRAST:  50 mL Isovue 370 IV COMPARISON:  Head CT 08/20/2016 FINDINGS: CT HEAD FINDINGS Brain: No mass lesion, intraparenchymal hemorrhage or extra-axial collection. No evidence of acute cortical infarct. Mild volume loss. There is periventricular hypoattenuation compatible with chronic microvascular disease. Vascular: No hyperdense vessel or unexpected calcification. Skull: Normal visualized skull base, calvarium and extracranial soft tissues. Sinuses/Orbits: No sinus fluid levels or advanced mucosal thickening. No mastoid effusion. Normal orbits. CTA NECK FINDINGS Aortic arch: There is no aneurysm or dissection of the visualized ascending aorta or aortic arch. There is a normal variant aortic arch branching pattern with the brachiocephalic and left common carotid arteries sharing a common origin. The visualized proximal subclavian arteries  are normal. Mild aortic arch atherosclerotic calcification. Right carotid system: The right common carotid origin is widely patent. There is no common carotid or internal carotid artery dissection or aneurysm. There is mild atherosclerotic calcification at the carotid bifurcation without hemodynamically significant stenosis. Left carotid system: The left common carotid origin is widely patent. There is no common carotid or internal carotid artery dissection or aneurysm. No hemodynamically significant stenosis. Vertebral arteries: The vertebral system is left dominant. Both vertebral artery origins are normal. There is progressive narrowing of the right vertebral artery, which becomes occluded at the C5 level. Minimal opacification of the distal right V4 segment likely secondary to retrograde flow. The left vertebral artery is normal. Skeleton: There is no bony spinal canal stenosis. No lytic or blastic lesions. Other neck: The nasopharynx is clear. The oropharynx and hypopharynx are normal. The epiglottis is normal. The supraglottic larynx, glottis and subglottic larynx are normal. No retropharyngeal collection. The parapharyngeal spaces are preserved. The parotid and submandibular glands are normal. No sialolithiasis or salivary ductal dilatation. There is a heterogeneous left thyroid nodule measuring 2 cm. There is no cervical lymphadenopathy. Upper chest: No pneumothorax or pleural effusion. No nodules or masses. Review of the MIP images confirms the above findings CTA HEAD FINDINGS Anterior circulation: --Intracranial internal carotid arteries: Normal. --Anterior cerebral arteries: Normal. --Middle cerebral arteries: Normal. --Posterior communicating arteries: Absent bilaterally. Posterior circulation: --Posterior cerebral arteries: Normal. --Superior cerebellar arteries: Normal. --Basilar artery: Normal. --Anterior inferior cerebellar arteries: Present on the left. Not clearly seen on the right. --Posterior  inferior cerebellar arteries: Normal on the left. Not seen on the right. Venous sinuses: As permitted by contrast timing, patent. Anatomic variants: None Delayed phase: No parenchymal contrast enhancement. Review of the  MIP images confirms the above findings IMPRESSION: 1. Tapering of the right vertebral artery, which becomes occluded at the level of C5. The appearance is concerning for dissection. 2. Non opacification of the right anterior and posterior inferior cerebellar arteries. Given the clinical context, the findings are compatible with posterior fossa ischemia. Further evaluation with MRI may be helpful to evaluate for an acute cerebellar infarct. 3. 2 cm left thyroid nodule. Nonemergent follow-up with dedicated thyroid ultrasound is recommended. 4. Variant arch anatomy with the brachiocephalic and left common carotid arteries sharing a common origin. No right subclavian stenosis. Wide patency of right vertebral artery origin. Critical Value/emergent results were called by telephone at the time of interpretation on 08/20/2016 at 3:08 pm to Dr. Charlesetta Shanks , who verbally acknowledged these results. Electronically Signed   By: Ulyses Jarred M.D.   On: 08/20/2016 15:16   Ct Angio Neck W And/or Wo Contrast  Result Date: 08/20/2016 CLINICAL DATA:  Headache and vertigo EXAM: CT ANGIOGRAPHY HEAD AND NECK TECHNIQUE: Multidetector CT imaging of the head and neck was performed using the standard protocol during bolus administration of intravenous contrast. Multiplanar CT image reconstructions and MIPs were obtained to evaluate the vascular anatomy. Carotid stenosis measurements (when applicable) are obtained utilizing NASCET criteria, using the distal internal carotid diameter as the denominator. CONTRAST:  50 mL Isovue 370 IV COMPARISON:  Head CT 08/20/2016 FINDINGS: CT HEAD FINDINGS Brain: No mass lesion, intraparenchymal hemorrhage or extra-axial collection. No evidence of acute cortical infarct. Mild  volume loss. There is periventricular hypoattenuation compatible with chronic microvascular disease. Vascular: No hyperdense vessel or unexpected calcification. Skull: Normal visualized skull base, calvarium and extracranial soft tissues. Sinuses/Orbits: No sinus fluid levels or advanced mucosal thickening. No mastoid effusion. Normal orbits. CTA NECK FINDINGS Aortic arch: There is no aneurysm or dissection of the visualized ascending aorta or aortic arch. There is a normal variant aortic arch branching pattern with the brachiocephalic and left common carotid arteries sharing a common origin. The visualized proximal subclavian arteries are normal. Mild aortic arch atherosclerotic calcification. Right carotid system: The right common carotid origin is widely patent. There is no common carotid or internal carotid artery dissection or aneurysm. There is mild atherosclerotic calcification at the carotid bifurcation without hemodynamically significant stenosis. Left carotid system: The left common carotid origin is widely patent. There is no common carotid or internal carotid artery dissection or aneurysm. No hemodynamically significant stenosis. Vertebral arteries: The vertebral system is left dominant. Both vertebral artery origins are normal. There is progressive narrowing of the right vertebral artery, which becomes occluded at the C5 level. Minimal opacification of the distal right V4 segment likely secondary to retrograde flow. The left vertebral artery is normal. Skeleton: There is no bony spinal canal stenosis. No lytic or blastic lesions. Other neck: The nasopharynx is clear. The oropharynx and hypopharynx are normal. The epiglottis is normal. The supraglottic larynx, glottis and subglottic larynx are normal. No retropharyngeal collection. The parapharyngeal spaces are preserved. The parotid and submandibular glands are normal. No sialolithiasis or salivary ductal dilatation. There is a heterogeneous left  thyroid nodule measuring 2 cm. There is no cervical lymphadenopathy. Upper chest: No pneumothorax or pleural effusion. No nodules or masses. Review of the MIP images confirms the above findings CTA HEAD FINDINGS Anterior circulation: --Intracranial internal carotid arteries: Normal. --Anterior cerebral arteries: Normal. --Middle cerebral arteries: Normal. --Posterior communicating arteries: Absent bilaterally. Posterior circulation: --Posterior cerebral arteries: Normal. --Superior cerebellar arteries: Normal. --Basilar artery: Normal. --Anterior inferior cerebellar arteries:  Present on the left. Not clearly seen on the right. --Posterior inferior cerebellar arteries: Normal on the left. Not seen on the right. Venous sinuses: As permitted by contrast timing, patent. Anatomic variants: None Delayed phase: No parenchymal contrast enhancement. Review of the MIP images confirms the above findings IMPRESSION: 1. Tapering of the right vertebral artery, which becomes occluded at the level of C5. The appearance is concerning for dissection. 2. Non opacification of the right anterior and posterior inferior cerebellar arteries. Given the clinical context, the findings are compatible with posterior fossa ischemia. Further evaluation with MRI may be helpful to evaluate for an acute cerebellar infarct. 3. 2 cm left thyroid nodule. Nonemergent follow-up with dedicated thyroid ultrasound is recommended. 4. Variant arch anatomy with the brachiocephalic and left common carotid arteries sharing a common origin. No right subclavian stenosis. Wide patency of right vertebral artery origin. Critical Value/emergent results were called by telephone at the time of interpretation on 08/20/2016 at 3:08 pm to Dr. Charlesetta Shanks , who verbally acknowledged these results. Electronically Signed   By: Ulyses Jarred M.D.   On: 08/20/2016 15:16   Mr Jodene Nam Head Wo Contrast  Result Date: 08/20/2016 CLINICAL DATA:  Vertebral artery dissection.   Dizziness. EXAM: MRI HEAD WITHOUT CONTRAST MRA HEAD WITHOUT CONTRAST TECHNIQUE: Multiplanar, multiecho pulse sequences of the brain and surrounding structures were obtained without intravenous contrast. Angiographic images of the head were obtained using MRA technique without contrast. COMPARISON:  CTA head and neck 08/20/2016 FINDINGS: MRI HEAD FINDINGS Brain: There are multiple punctate foci of diffusion restriction within the right cerebellar hemisphere, within the right PICA territory. No other areas of diffusion restriction. No evidence of hemorrhage. There is an old left cerebellar infarct. The brain parenchymal signal is normal. No mass lesion or midline shift. No hydrocephalus or extra-axial fluid collection. The midline structures are normal. No age advanced or lobar predominant atrophy. Vascular: Major intracranial arterial and venous sinus flow voids are preserved. No evidence of chronic microhemorrhage or amyloid angiopathy. Skull and upper cervical spine: The visualized skull base, calvarium, upper cervical spine and extracranial soft tissues are normal. Sinuses/Orbits: No fluid levels or advanced mucosal thickening. No mastoid effusion. Normal orbits. MRA HEAD FINDINGS Intracranial internal carotid arteries: Normal. Anterior cerebral arteries: Normal. Middle cerebral arteries: Normal. Posterior communicating arteries: Present bilaterally. Posterior cerebral arteries: Normal. Basilar artery: Normal. Vertebral arteries: Left dominant. There is no flow related enhancement seen within the proximal V4 segment of the right vertebral artery. Flow related enhancement within a short segment of the most distal right V4 segment is likely due to retrograde filling. Superior cerebellar arteries: Normal. Anterior inferior cerebellar arteries: Normal. Posterior inferior cerebellar arteries: No flow related enhancement of the right PICA. Left PICA is normal. IMPRESSION: 1. Multiple small foci of acute ischemia within  the right cerebellar hemisphere, within the PICA territory. No hematoma or mass effect. 2. 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 3. Old left cerebellar infarct. Electronically Signed   By: Ulyses Jarred M.D.   On: 08/20/2016 20:41   Mr Brain Wo Contrast  Result Date: 08/20/2016 CLINICAL DATA:  Vertebral artery dissection.  Dizziness. EXAM: MRI HEAD WITHOUT CONTRAST MRA HEAD WITHOUT CONTRAST TECHNIQUE: Multiplanar, multiecho pulse sequences of the brain and surrounding structures were obtained without intravenous contrast. Angiographic images of the head were obtained using MRA technique without contrast. COMPARISON:  CTA head and neck 08/20/2016 FINDINGS: MRI HEAD FINDINGS Brain: There are multiple punctate  foci of diffusion restriction within the right cerebellar hemisphere, within the right PICA territory. No other areas of diffusion restriction. No evidence of hemorrhage. There is an old left cerebellar infarct. The brain parenchymal signal is normal. No mass lesion or midline shift. No hydrocephalus or extra-axial fluid collection. The midline structures are normal. No age advanced or lobar predominant atrophy. Vascular: Major intracranial arterial and venous sinus flow voids are preserved. No evidence of chronic microhemorrhage or amyloid angiopathy. Skull and upper cervical spine: The visualized skull base, calvarium, upper cervical spine and extracranial soft tissues are normal. Sinuses/Orbits: No fluid levels or advanced mucosal thickening. No mastoid effusion. Normal orbits. MRA HEAD FINDINGS Intracranial internal carotid arteries: Normal. Anterior cerebral arteries: Normal. Middle cerebral arteries: Normal. Posterior communicating arteries: Present bilaterally. Posterior cerebral arteries: Normal. Basilar artery: Normal. Vertebral arteries: Left dominant. There is no flow related enhancement seen within the proximal V4 segment of the right  vertebral artery. Flow related enhancement within a short segment of the most distal right V4 segment is likely due to retrograde filling. Superior cerebellar arteries: Normal. Anterior inferior cerebellar arteries: Normal. Posterior inferior cerebellar arteries: No flow related enhancement of the right PICA. Left PICA is normal. IMPRESSION: 1. Multiple small foci of acute ischemia within the right cerebellar hemisphere, within the PICA territory. No hematoma or mass effect. 2. 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 3. Old left cerebellar infarct. Electronically Signed   By: Ulyses Jarred M.D.   On: 08/20/2016 20:41   Ct Head Code Stroke W/o Cm  Result Date: 08/20/2016 CLINICAL DATA:  Code stroke. Severe dizziness with RIGHT-sided headache beginning earlier today. EXAM: CT HEAD WITHOUT CONTRAST TECHNIQUE: Contiguous axial images were obtained from the base of the skull through the vertex without intravenous contrast. COMPARISON:  12/19/2014. FINDINGS: Brain: No evidence for acute infarction, hemorrhage, mass lesion, hydrocephalus, or extra-axial fluid. Mild atrophy. Slight hypoattenuation of white matter, favored to represent chronic microvascular ischemic change. Vascular: Mild vascular calcification in the distal vertebral arteries and carotid siphon segments. No signs of large vessel occlusion. Skull: Normal. Negative for fracture or focal lesion. Sinuses/Orbits: No acute finding. Other: None. ASPECTS [facility] Stroke Program Early CT Score) - Ganglionic level infarction (caudate, lentiform nuclei, internal capsule, insula, M1-M3 cortex): 7 - Supraganglionic infarction (M4-M6 cortex): 3 Total score (0-10 with 10 being normal): 10 IMPRESSION: 1. Atrophy and small vessel disease. No acute intracranial findings. No findings suggestive of large vessel occlusion. 2. ASPECTS is 10 These results were called by telephone at the time of interpretation on  08/20/2016 at 12:15 pm to Dr. Leonel Ramsay , who verbally acknowledged these results. Electronically Signed   By: Staci Righter M.D.   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 Does the need for close, 24 hr/day medical supervision in concert with the patient's rehab needs make it unreasonable for this patient to be served in a less intensive setting? Yes 2. Co-Morbidities requiring supervision/potential complications: History of myocardial infarction and coronary artery disease, history of morbid obesity, status post lap band 3. Due to bladder management, bowel management, safety, skin/wound care, disease management, medication administration, pain management and patient education, does the patient require 24 hr/day rehab nursing? Yes 4. Does the patient require coordinated care of a physician, rehab nurse, PT (1-2 hrs/day, 5 days/week) and OT (1-2 hrs/day, 5 days/week) to address physical and functional deficits in the  context of the above medical diagnosis(es)? Yes Addressing deficits in the following areas: balance, endurance, locomotion, strength, transferring, bowel/bladder control, bathing, dressing, feeding, grooming, toileting, cognition and psychosocial support 5. Can the patient actively participate in an intensive therapy program of at least 3 hrs of therapy per day at least 5 days per week? Yes 6. The potential for patient to make measurable gains while on inpatient rehab is good 7. Anticipated functional outcomes upon discharge from inpatient rehab are modified independent and supervision  with PT, modified independent and supervision with OT, n/a with SLP. 8. Estimated rehab length of stay to reach the above functional goals is: 10-14 days 9. Does the patient have adequate social supports and living environment to accommodate these discharge functional goals? Yes 10. Anticipated D/C setting:  Home 11. Anticipated post D/C treatments: [place] therapy 12. Overall Rehab/Functional Prognosis: excellent  RECOMMENDATIONS: This patient's condition is appropriate for continued rehabilitative care in the following setting: CIR Patient has agreed to participate in recommended program. Yes Note that insurance prior authorization may be required for reimbursement for recommended care.  CommentCathlyn Jackson., PA-C 08/21/2016    Revision History                        Routing History

## 2016-08-23 NOTE — H&P (Signed)
Physical Medicine and Rehabilitation Admission H&P    Chief Complaint  Patient presents with  . Dizziness with staggering gait.    HPI:    Elaine Jackson is a 61 y.o. right handed female with history of CAD, HTN, T2DM, Chronic bradycardia, migraines in the past; who 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 She was admitted on 08/20/16 and reported constant HA radiating from right temporal area to periorbital and right posterior neck as well as photophobia. UDS negative.  CT head reviewed, no acute abnormality. CTA head/neck done revealing tapering of right VA which becomes occluded at C5 concerning for dissection, non opacification of R-ACA and posterior inferior PCA compatible  Posterior fossa ischemia.  MRI/MRA brain done revealing multiple small foci of ischemia in right cerebellar hemisphere within PICA territory, occlusion of visualized portion of R-VA and right PICA. Neurology recommended adding Plavix to ASA for infarcts due to large vessel source and 30 day cardiac event monitoring recommended to rule out A fib due to reports of palpitations at home.   Cardiology consulted due to bradycardia with HR in 30's and felt that worsening of chronic bradycardia likely due to compression of vagus nerve from vertebral dissection and felt that there was no indications for PPM at this time. Dr. Claiborne Billings recommends long term use of low dose ASA and Plavix. 2 D echo revealed EF [id number], grade 1 diastolic dysfunction and no wall abnormality. Loop recorder to be placed today.  Therapy ongoing and and patient with staggering gait and poor safety awareness.  CIR recommended for follow up therapy.    Review of Systems  HENT: Negative for hearing loss and tinnitus.   Eyes: Negative for blurred vision and double vision.  Respiratory: Negative for cough, sputum production and shortness of breath.   Cardiovascular: Negative for chest pain and palpitations.  Gastrointestinal: Positive for  constipation and heartburn. Negative for nausea and vomiting.  Genitourinary: Negative for frequency and urgency.  Musculoskeletal: Negative for back pain and neck pain.  Skin: Negative for itching and rash.  Neurological: Negative for dizziness, sensory change, focal weakness and headaches.  Psychiatric/Behavioral: Negative for depression. The patient is not nervous/anxious and does not have insomnia.   All other systems reviewed and are negative.     Past Medical History:  Diagnosis Date  . Allergic rhinitis, cause unspecified   . Allergy   . CAD (coronary artery disease)    LHC 12/20/2014 showed small to moderate apical infarction due to distal LAD stenosis with reestablished TIMI2 flow. No PCI. Recommended plavix for 1 yr and ASA for life.   . Chicken pox   . Diabetes mellitus   . Family history of breast cancer in first degree relative    sister age 58  . Heart murmur   . Hyperlipidemia   . Hypertension   . Malignant neoplasm skin of face 02/16/2007   Basal Cell Carcinoma  [place].  . Measles   . Obesity, unspecified   . Other chronic nonalcoholic liver disease   . Sleep related leg cramps   . Unspecified vitamin D deficiency     Past Surgical History:  Procedure Laterality Date  . CARDIAC CATHETERIZATION N/A 12/20/2014   Procedure: Left Heart Cath and Coronary Angiography;  Surgeon: Sanda Klein, MD;  Location: [facility];  Service: Cardiovascular;  Laterality: N/A;  . CHOLECYSTECTOMY    . COLONOSCOPY  06/25/2007   [place]. Normal. Repeat in ten years.  Marland Kitchen LAPAROSCOPIC GASTRIC  BANDING  07/03/10   Dr. Sherrin Daisy; [facility]  . TONSILLECTOMY      Family History  Problem Relation Age of Onset  . Diabetes Mother   . Heart disease Mother     defibrillator; carotid artery stenosis.  . Hypertension Mother   . Hyperlipidemia Mother   . Dementia Mother   . Congestive Heart Failure Mother   . Heart disease Father   . Kidney failure Father     ESRD/peritoneal  dialysis  . Diabetes Father   . Hypertension Father   . Hyperlipidemia Father   . Stroke Father   . Kidney disease Father   . Congestive Heart Failure Father   . Diabetes Sister   . Hypertension Sister   . Kidney disease Sister     renal failure  . Diabetes Brother   . Hypertension Brother   . Hyperlipidemia Brother   . Arthritis Brother   . Cervical cancer Sister   . Hypertension Sister   . Liver cancer Sister 62  . Stroke Maternal Grandmother   . Heart disease Maternal Grandfather   . Stroke Paternal Grandmother   . Heart disease Paternal Grandmother   . Heart disease Paternal Grandfather   . Stroke Paternal Grandfather   . Breast cancer Sister 51  . Hepatitis C Sister 90    Social History:  Married. Works as a Theme park manager. She reports that she has quit smoking 21 years ago.  Her smoking use included Cigarettes. She has a 25.00 pack-year smoking history. She has never used smokeless tobacco. She reports that she does not drink alcohol or use drugs.   Allergies  Allergen Reactions  . Sulfa Antibiotics Nausea And Vomiting    Medications Prior to Admission  Medication Sig Dispense Refill  . aspirin EC 81 MG EC tablet Take 1 tablet (81 mg total) by mouth daily.    Marland Kitchen atorvastatin (LIPITOR) 80 MG tablet TAKE 1 TABLET (80 MG TOTAL) BY MOUTH DAILY AT 6 PM. 90 tablet 3  . cholecalciferol (VITAMIN D) 1000 UNITS tablet Take 1,000 Units by mouth daily.     . Cyanocobalamin (VITAMIN B-12 PO) Take 1 tablet by mouth daily.    . fluticasone (FLONASE) 50 MCG/ACT nasal spray Place 2 sprays into both nostrils daily as needed. For allergies  11  . Lancets (ONETOUCH ULTRASOFT) lancets Use as instructed -- check sugar once daily One touch ultra 2 100 each 3  . losartan-hydrochlorothiazide (HYZAAR) 100-25 MG tablet Take 1 tablet by mouth daily. 90 tablet 3  . montelukast (SINGULAIR) 10 MG tablet TAKE 1 TABLET (10 MG TOTAL) BY MOUTH AT BEDTIME. 90 tablet 3  . nitroGLYCERIN (NITROSTAT) 0.4 MG  SL tablet Place 1 tablet (0.4 mg total) under the tongue every 5 (five) minutes x 3 doses as needed for chest pain. 25 tablet 3  . ONE TOUCH ULTRA TEST test strip USE TO TEST BLOOD SUGAR ONCE DAILY. E11.9 100 each 0  . clopidogrel (PLAVIX) 75 MG tablet Take 1 tablet (75 mg total) by mouth daily. (Patient not taking: Reported on 05/01/2016) 90 tablet 3    Home: Walker expects to be discharged to:: Private residence Living Arrangements: Spouse/significant other Available Help at Discharge: Family, Available 24 hours/day (daughter; 2 grandsons 12&14) Type of Home: House Home Access: Stairs to enter Technical brewer of Steps: 2 Entrance Stairs-Rails: None Home Layout: One level Bathroom Shower/Tub: Multimedia programmer: Standard Bathroom Accessibility: Yes Home Equipment: [place] - single point, Environmental consultant - 4 wheels, Grab  bars - tub/shower, Wheelchair - manual Additional Comments: 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 Pt reports improvement in symptoms however continues to demonstrate a R lateral lean at times.  Transfers Overall transfer level: Needs assistance Equipment used: Rolling walker (2 wheeled), None Transfers: Sit to/from Stand Sit to Stand: Min assist General transfer comment: Without RW for support, pt required assistance for controlled descent to chair. Hands-on guarding for safety as pt powered-up to full standing position.  Ambulation/Gait Ambulation/Gait assistance: Min assist, Mod assist Ambulation Distance (Feet): 80  Feet Assistive device: Rolling walker (2 wheeled), 1 person hand held assist Gait Pattern/deviations: Step-through pattern, Decreased stride length, Staggering right, Drifts right/left, Wide base of support, Trunk flexed General Gait Details: With RW, pt required min assist and demonstrated a heavy R lateral lean initially. Improved with distance, and attempted walk back to room without RW. Mod assist provided for balance support and safety. Pt holding therapist's hand with L and R side was in a high guard position.  Gait velocity: Decreased Gait velocity interpretation: Below normal speed for age/gender Stairs: Yes Stairs assistance: Mod assist Stair Management: No rails, Step to pattern, Forwards (HHA) Number of Stairs: 4 (2 x2) General stair comments: VC's for sequencing and safety. Min assist provided for ascending stairs, and mod assist provided for descending stairs.     ADL: ADL Overall ADL's : Needs assistance/impaired Eating/Feeding: Supervision/ safety, Set up, Sitting Grooming: Min guard, Standing Upper Body Bathing: Minimal assistance, Standing Lower Body Bathing: Min guard, Sit to/from stand Upper Body Dressing : Sitting, Moderate assistance Lower Body Dressing: Min guard, Sit to/from stand Toilet Transfer: Minimal assistance, Ambulation, RW Toilet Transfer Details (indicate cue type and reason): VC's to slow down and for safety. Min assist due to leaning to the R. Toileting- Clothing Manipulation and Hygiene: Supervision/safety, Sitting/lateral lean Functional mobility during ADLs: Minimal assistance, Rolling walker General ADL Comments: Pt with significant R preference and running into items on the R.  Cognition: Cognition Overall Cognitive Status: Within Functional Limits for tasks assessed Orientation Level: Oriented X4 Cognition Arousal/Alertness: Awake/alert Behavior During Therapy: WFL for tasks assessed/performed Overall Cognitive Status: Within Functional  Limits for tasks assessed General Comments: Pt is used to being independent, and so can display impulsivity trying to retain independence and especially when she feels her right side is impacting her ability to function   Blood pressure (!) 116/51, pulse (!) 55, temperature 98.3 F (36.8 C), temperature source Oral, resp. rate 16, SpO2 97 %. Physical Exam  Nursing note and vitals reviewed. Constitutional: She is oriented to person, place, and time. She appears well-developed and well-nourished.  HENT:  Head: Normocephalic and atraumatic.  Mouth/Throat: Oropharynx is clear and moist.  Eyes: Conjunctivae and EOM are normal. Pupils are equal, round, and reactive to light.  Neck: Normal range of motion. Neck supple.  Cardiovascular: Normal rate and regular rhythm.   Respiratory: Effort normal and breath sounds normal. No stridor. No respiratory distress. She has no wheezes.  GI: Soft. Bowel sounds are normal. There is no tenderness.  Musculoskeletal: She exhibits no edema or tenderness.  Neurological: She is alert  and oriented to person, place, and time.  Speech clear.  Follows commands without difficulty.  Motor: 4+-5/5 throughout Neg ataxia, dysdiadochokinesia Sensation intact to light touch DTRs symmetric   Skin: Skin is warm and dry. No rash noted. No erythema.  Psychiatric: She has a normal mood and affect. Her behavior is normal. Thought content normal.    Results for orders placed or performed during the hospital encounter of 08/20/16 (from the past 48 hour(s))  Glucose, capillary     Status: Abnormal   Collection Time: 08/21/16 11:25 AM  Result Value Ref Range   Glucose-Capillary 140 (H) 65 - 99 mg/dL  Glucose, capillary     Status: Abnormal   Collection Time: 08/21/16  4:48 PM  Result Value Ref Range   Glucose-Capillary 112 (H) 65 - 99 mg/dL  Glucose, capillary     Status: Abnormal   Collection Time: 08/21/16  8:06 PM  Result Value Ref Range   Glucose-Capillary 127 (H)  65 - 99 mg/dL   Comment 1 Notify RN    Comment 2 Document in Chart   Glucose, capillary     Status: Abnormal   Collection Time: 08/22/16 12:16 AM  Result Value Ref Range   Glucose-Capillary 141 (H) 65 - 99 mg/dL   Comment 1 Notify RN    Comment 2 Document in Chart   Glucose, capillary     Status: Abnormal   Collection Time: 08/22/16  4:15 AM  Result Value Ref Range   Glucose-Capillary 119 (H) 65 - 99 mg/dL   Comment 1 Notify RN    Comment 2 Document in Chart   Glucose, capillary     Status: Abnormal   Collection Time: 08/22/16  8:54 AM  Result Value Ref Range   Glucose-Capillary 182 (H) 65 - 99 mg/dL  Glucose, capillary     Status: Abnormal   Collection Time: 08/22/16 11:46 AM  Result Value Ref Range   Glucose-Capillary 109 (H) 65 - 99 mg/dL  Glucose, capillary     Status: None   Collection Time: 08/22/16  4:49 PM  Result Value Ref Range   Glucose-Capillary 99 65 - 99 mg/dL  Glucose, capillary     Status: Abnormal   Collection Time: 08/22/16  8:53 PM  Result Value Ref Range   Glucose-Capillary 131 (H) 65 - 99 mg/dL   Comment 1 Notify RN    Comment 2 Document in Chart   Glucose, capillary     Status: Abnormal   Collection Time: 08/22/16 11:30 PM  Result Value Ref Range   Glucose-Capillary 118 (H) 65 - 99 mg/dL   Comment 1 Notify RN    Comment 2 Document in Chart   Glucose, capillary     Status: Abnormal   Collection Time: 08/23/16  4:04 AM  Result Value Ref Range   Glucose-Capillary 123 (H) 65 - 99 mg/dL   Comment 1 Notify RN    Comment 2 Document in Chart   Glucose, capillary     Status: Abnormal   Collection Time: 08/23/16  7:47 AM  Result Value Ref Range   Glucose-Capillary 113 (H) 65 - 99 mg/dL   No results found.     Medical Problem List and Plan: 1.  Gait abnormality, balance deficits secondary to right PICA infarct.  Cont ASA and Plavix 2.  DVT Prophylaxis/Anticoagulation: Pharmaceutical: Lovenox 3. Pain Management: PRN meds 4. Mood: Is pretty upbeat.  LCSW to follow for evaluation and support.  5. Neuropsych: This patient is capable of making decisions  on her own behalf. 6. Skin/Wound Care: routine pressure relief measures.  7. Fluids/Electrolytes/Nutrition: Monitor I/O. Intake has improved.  8. Migraines: Headaches resolved.  9. HTN: Monitor BP bid-permissive HTN. Off Hyzaar 10 CAD with chronic bradycardia: to continue ASA/Plavix indefinitely.  11. T2DM: controlled with diet--Hgb A1c stable at 5.8. Will monitor BS ac/hs for now.  12. Constipation: Will start bowel regimen  13. Morbid obesity  There is no height or weight on file to calculate BMI.  Diet and exercise education  Encourage weight loss to increase endurance and promote overall health   Post Admission Physician Evaluation: 1. Preadmission assessment reviewed and changes made below. 2. Functional deficits secondary  to right PICA infarct. 3. Patient is admitted to receive collaborative, interdisciplinary care between the physiatrist, rehab nursing staff, and therapy team. 4. Patient's level of medical complexity and substantial therapy needs in context of that medical necessity cannot be provided at a lesser intensity of care such as a SNF. 5. Patient has experienced substantial functional loss from his/her baseline which was documented above under the "Functional History" and "Functional Status" headings.  Judging by the patient's diagnosis, physical exam, and functional history, the patient has potential for functional progress which will result in measurable gains while on inpatient rehab.  These gains will be of substantial and practical use upon discharge  in facilitating mobility and self-care at the household level. 6. Physiatrist will provide 24 hour management of medical needs as well as oversight of the therapy plan/treatment and provide guidance as appropriate regarding the interaction of the two. 7. The Preadmission Screening has been reviewed and patient status is  unchanged unless otherwise stated above. 8. 24 hour rehab nursing will assist with safety, disease management and patient education  and help integrate therapy concepts, techniques,education, etc. 9. PT will assess and treat for/with: Lower extremity strength, range of motion, stamina, balance, functional mobility, safety, adaptive techniques and equipment,  coping skills, pain control, education.   Goals are: Supervision/Mod I. 10. OT will assess and treat for/with: ADL's, functional mobility, safety, upper extremity strength, adaptive techniques and equipment, ego support, and community reintegration.   Goals are: Supervision/Mod I. Therapy may proceed with showering this patient. 11. Case Management and Social Worker will assess and treat for psychological issues and discharge planning. 12. Team conference will be held weekly to assess progress toward goals and to determine barriers to discharge. 13. Patient will receive at least 3 hours of therapy per day at least 5 days per week. 14. ELOS: 7-10 days.       15. Prognosis:  good   Delice Lesch, MD, [address], [place] 08/23/2016

## 2016-08-23 NOTE — Progress Notes (Signed)
Physical Therapy Treatment Patient Details Name: Elaine Jackson MRN: [id number] DOB: December 27, 1955 Today's Date: 08/23/2016    History of Present Illness Pt is 61 y/o female admitted secondary to Acute Ischemia in th R cerebellar hemisphere and vertebral artery dissection. PMH includes CAD, HTN, DM, heart murmur, vitamin D deficiency, and skin cancer.     PT Comments    Pt continuing to progress towards goals. Pt continues to demonstrated LOB to R during ambulation and higher level balance activities during ambulation. Pt demonstrating staggering to R and required mod A during all tasks to prevent LOB.  Continue to recommend CIR to enhance independence with functional mobility tasks. Will continue to follow.    Follow Up Recommendations  CIR;Supervision/Assistance - 24 hour     Equipment Recommendations  Other (comment) (TBD at next venue)    Recommendations for Other Services Rehab consult     Precautions / Restrictions Precautions Precautions: Fall Restrictions Weight Bearing Restrictions: No    Mobility  Bed Mobility Overal bed mobility: Needs Assistance Bed Mobility: Supine to Sit     Supine to sit: +2 for safety/equipment;Min guard (use of bedrails )     General bed mobility comments: Close guard for safety as pt transitioned to full sitting position at EOB. Pt reports improvement in symptoms however continues to report "wooziness" and demonstrate R lateral lean.   Transfers Overall transfer level: Needs assistance Equipment used: None Transfers: Sit to/from Stand Sit to Stand: Min assist         General transfer comment: Required min A upon standing for steadying without use of RW. Verbal cues for hand placement for descent into chair.   Ambulation/Gait Ambulation/Gait assistance: Min assist;Mod assist Ambulation Distance (Feet): 50 Feet Assistive device: None Gait Pattern/deviations: Step-through pattern;Decreased stride length;Staggering right;Drifts  right/left;Trunk flexed;Wide base of support Gait velocity: Decreased Gait velocity interpretation: Below normal speed for age/gender General Gait Details: Slow, cautious gait. Pt requiring min A for gait without AD secondary to decreased steadiness. Demonstrating drifting R during gait training and required verbal cues to maintain straight path.    Stairs            Wheelchair Mobility    Modified Rankin (Stroke Patients Only) Modified Rankin (Stroke Patients Only) Pre-Morbid Rankin Score: No symptoms Modified Rankin: Moderately severe disability     Balance Overall balance assessment: Needs assistance Sitting-balance support: No upper extremity supported;Feet supported Sitting balance-Leahy Scale: Fair     Standing balance support: No upper extremity supported Standing balance-Leahy Scale: Poor Standing balance comment: static standing             High level balance activites: Braiding;Backward walking;Head turns High Level Balance Comments: Pt practiced higher level balance tasks above. Pt demonstrating LOB to R during all activities and required mod A to maintain balance. Pt reports feeling unsteady to the R and recognizes need for assist.     Cognition Arousal/Alertness: Awake/alert Behavior During Therapy: [facility] for tasks assessed/performed Overall Cognitive Status: Within Functional Limits for tasks assessed                      Exercises      General Comments General comments (skin integrity, edema, etc.): Husband present during session.       Pertinent Vitals/Pain Pain Assessment: No/denies pain    Home Living                      Prior Function  PT Goals (current goals can now be found in the care plan section) Acute Rehab PT Goals Patient Stated Goal: to return home PT Goal Formulation: With patient/family Time For Goal Achievement: 09/04/16 Potential to Achieve Goals: Good Progress towards PT goals: Progressing  toward goals    Frequency    Min 4X/week      PT Plan Current plan remains appropriate    Co-evaluation             End of Session Equipment Utilized During Treatment: Gait belt Activity Tolerance: Patient tolerated treatment well Patient left: in chair;with call bell/phone within reach;with chair alarm set;with family/visitor present Nurse Communication: Mobility status PT Visit Diagnosis: Unsteadiness on feet (R26.81);Muscle weakness (generalized) (M62.81);Other symptoms and signs involving the nervous system (R29.898)     Time: [id number] PT Time Calculation (min) (ACUTE ONLY): 18 min  Charges:  $Neuromuscular Re-education: 8-22 mins                    G Codes:       Mamie Levers 08/23/2016, 12:41 PM  Nicky Pugh, PT, DPT  Acute Rehabilitation Services  Pager: [phone]

## 2016-08-24 ENCOUNTER — Inpatient Hospital Stay (HOSPITAL_COMMUNITY): Payer: BLUE CROSS/BLUE SHIELD | Admitting: *Deleted

## 2016-08-24 ENCOUNTER — Inpatient Hospital Stay (HOSPITAL_COMMUNITY): Payer: BLUE CROSS/BLUE SHIELD | Admitting: Occupational Therapy

## 2016-08-24 DIAGNOSIS — I10 Essential (primary) hypertension: Secondary | ICD-10-CM

## 2016-08-24 DIAGNOSIS — E669 Obesity, unspecified: Secondary | ICD-10-CM

## 2016-08-24 DIAGNOSIS — N179 Acute kidney failure, unspecified: Secondary | ICD-10-CM

## 2016-08-24 DIAGNOSIS — D62 Acute posthemorrhagic anemia: Secondary | ICD-10-CM

## 2016-08-24 DIAGNOSIS — E1169 Type 2 diabetes mellitus with other specified complication: Secondary | ICD-10-CM

## 2016-08-24 DIAGNOSIS — I639 Cerebral infarction, unspecified: Secondary | ICD-10-CM

## 2016-08-24 DIAGNOSIS — D72829 Elevated white blood cell count, unspecified: Secondary | ICD-10-CM

## 2016-08-24 LAB — COMPREHENSIVE METABOLIC PANEL WITH GFR
ALT: 23 U/L (ref 14–54)
AST: 30 U/L (ref 15–41)
Albumin: 3.6 g/dL (ref 3.5–5.0)
Alkaline Phosphatase: 51 U/L (ref 38–126)
Anion gap: 7 (ref 5–15)
BUN: 21 mg/dL — ABNORMAL HIGH (ref 6–20)
CO2: 28 mmol/L (ref 22–32)
Calcium: 9 mg/dL (ref 8.9–10.3)
Chloride: 103 mmol/L (ref 101–111)
Creatinine, Ser: 1.05 mg/dL — ABNORMAL HIGH (ref 0.44–1.00)
GFR calc Af Amer: >60 mL/min
GFR calc non Af Amer: 56 mL/min — ABNORMAL LOW
Glucose, Bld: 104 mg/dL — ABNORMAL HIGH (ref 65–99)
Potassium: 4.1 mmol/L (ref 3.5–5.1)
Sodium: 138 mmol/L (ref 135–145)
Total Bilirubin: 0.5 mg/dL (ref 0.3–1.2)
Total Protein: 5.9 g/dL — ABNORMAL LOW (ref 6.5–8.1)

## 2016-08-24 LAB — GLUCOSE, CAPILLARY
GLUCOSE-CAPILLARY: 103 mg/dL — AB (ref 65–99)
Glucose-Capillary: 118 mg/dL — ABNORMAL HIGH (ref 65–99)
Glucose-Capillary: 129 mg/dL — ABNORMAL HIGH (ref 65–99)
Glucose-Capillary: 84 mg/dL (ref 65–99)
Glucose-Capillary: 96 mg/dL (ref 65–99)

## 2016-08-24 LAB — CBC WITH DIFFERENTIAL/PLATELET
Basophils Absolute: 0 10*3/uL (ref 0.0–0.1)
Basophils Relative: 0 %
Eosinophils Absolute: 0.3 10*3/uL (ref 0.0–0.7)
Eosinophils Relative: 2 %
HEMATOCRIT: 35.8 % — AB (ref 36.0–46.0)
HEMOGLOBIN: 11.7 g/dL — AB (ref 12.0–15.0)
LYMPHS ABS: 2.8 10*3/uL (ref 0.7–4.0)
LYMPHS PCT: 26 %
MCH: 30.7 pg (ref 26.0–34.0)
MCHC: 32.7 g/dL (ref 30.0–36.0)
MCV: 94 fL (ref 78.0–100.0)
Monocytes Absolute: 0.7 10*3/uL (ref 0.1–1.0)
Monocytes Relative: 6 %
NEUTROS PCT: 66 %
Neutro Abs: 7 10*3/uL (ref 1.7–7.7)
Platelets: 231 10*3/uL (ref 150–400)
RBC: 3.81 MIL/uL — AB (ref 3.87–5.11)
RDW: 13.2 % (ref 11.5–15.5)
WBC: 10.7 10*3/uL — AB (ref 4.0–10.5)

## 2016-08-24 MED ORDER — INSULIN ASPART 100 UNIT/ML ~~LOC~~ SOLN: 0.0000 [IU] | Freq: Three times a day (TID) | SUBCUTANEOUS | Status: DC

## 2016-08-24 NOTE — Evaluation (Signed)
Occupational Therapy Assessment and Plan  Patient Details  Name: Elaine Jackson MRN: [id number] Date of Birth: [birth year]  OT Diagnosis: muscle weakness Rehab Potential: Rehab Potential (ACUTE ONLY): Good ELOS: 3-5 days   Today's Date: 08/24/2016 OT Individual Time: 1000-1113 OT Individual Time Calculation (min): 73 min     Problem List:  Patient Active Problem List   Diagnosis Date Noted  . AKI (acute kidney injury) ([place])   . Acute blood loss anemia   . Leukocytosis   . Morbid obesity ([place])   . Cerebellar stroke ([place]) 08/23/2016  . Neurologic gait disorder   . History of migraine   . Benign essential HTN   . Diabetes mellitus type 2 in obese ([place])   . Slow transit constipation   . Acute CVA (cerebrovascular accident) ([place]) 08/21/2016  . Vertebral artery dissection ([place]) 08/20/2016  . Acute kidney injury ([place]) 08/20/2016  . Left thyroid nodule 08/20/2016  . Bradycardia 08/20/2016  . Abnormal urinalysis 08/20/2016  . Diabetes mellitus with complication ([place])   . Vertebrobasilar occlusive disease   . Chronic seasonal allergic rhinitis due to pollen 05/20/2016  . Secondary pulmonary hypertension 08/28/2015  . Old MI (myocardial infarction) 08/28/2015  . Ischemic heart disease 08/28/2015  . Coronary artery disease involving native coronary artery of native heart without angina pectoris 05/15/2015  . Migraine 12/21/2014  . Vitamin D deficiency 01/20/2013  . Nail dystrophy 09/14/2012  . Anemia, unspecified 06/08/2012  . Diabetes mellitus type II, controlled ([place]) 04/16/2012  . Essential hypertension, benign 04/16/2012  . Dyslipidemia 04/16/2012  . Class 2 obesity due to excess calories with serious comorbidity and body mass index (BMI) of 36.0 to 36.9 in adult 04/16/2012  . Hx of laparoscopic gastric banding 02/13/2011    Past Medical History:  Past Medical History:  Diagnosis Date  . Allergic rhinitis, cause unspecified   . Allergy   . CAD (coronary artery disease)     LHC 12/20/2014 showed small to moderate apical infarction due to distal LAD stenosis with reestablished TIMI2 flow. No PCI. Recommended plavix for 1 yr and ASA for life.   . Chicken pox   . Diabetes mellitus   . Family history of breast cancer in first degree relative    sister age 12  . Heart murmur   . Hyperlipidemia   . Hypertension   . Malignant neoplasm skin of face 02/16/2007   Basal Cell Carcinoma  [place].  . Measles   . Obesity, unspecified   . Other chronic nonalcoholic liver disease   . Sleep related leg cramps   . Unspecified vitamin D deficiency    Past Surgical History:  Past Surgical History:  Procedure Laterality Date  . CARDIAC CATHETERIZATION N/A 12/20/2014   Procedure: Left Heart Cath and Coronary Angiography;  Surgeon: Sanda Klein, MD;  Location: [facility];  Service: Cardiovascular;  Laterality: N/A;  . CHOLECYSTECTOMY    . COLONOSCOPY  06/25/2007   [place]. Normal. Repeat in ten years.  Marland Kitchen LAPAROSCOPIC GASTRIC BANDING  07/03/10   Dr. Sherrin Daisy; [facility]  . TONSILLECTOMY      Assessment & Plan Clinical Impression: Patient is a 60 y.o. year old female withhistory of CAD, HTN, T2DM, Chronic bradycardia, migraines in the past; who 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 She was admitted on 08/20/16 and reported constant HA radiating from right temporal area to periorbital and right posterior neck as well as photophobia. UDS negative.  CT head reviewed, no acute abnormality.  CTA head/neck done revealing tapering of right VA which becomes occluded at C5 concerning for dissection, non opacification of R-ACA and posterior inferior PCA compatible  Posterior fossa ischemia.  MRI/MRA brain done revealing multiple small foci of ischemia in right cerebellar hemisphere within PICA territory, occlusion of visualized portion of R-VA and right PICA. Neurology recommended adding Plavix to ASA for infarcts due to large vessel source and 30 day cardiac  event monitoring recommended to rule out A fib due to reports of palpitations at home.   Cardiology consulted due to bradycardia with HR in 30's and felt that worsening of chronic bradycardia likely due to compression of vagus nerve from vertebral dissection and felt that there was no indications for PPM at this time. Dr. Claiborne Billings recommends long term use of low dose ASA and Plavix. 2 D echo revealed EF 46-56%, grade 1 diastolic dysfunction and no wall abnormality. Loop recorder to be placed today.  Therapy ongoing and and patient with staggering gait and poor safety awareness.  CIR recommended for follow up therapy.  .  Patient transferred to CIR on 08/23/2016 .    Patient currently requires supervision with basic self-care skills and IADL secondary to muscle weakness, decreased cardiorespiratoy endurance and decreased standing balance and decreased balance strategies.  Prior to hospitalization, patient could complete ADls and IADLs with independent .  Patient will benefit from skilled intervention to increase independence with basic self-care skills and increase level of independence with iADL prior to discharge home with care partner.  Anticipate patient will require increased time and no further OT follow recommended.  OT - End of Session Activity Tolerance: Improving Endurance Deficit: Yes Endurance Deficit Description: rest breaks needed with functional mobility OT Assessment Rehab Potential (ACUTE ONLY): Good OT Patient demonstrates impairments in the following area(s): Balance;Endurance;Motor;Safety OT Basic ADL's Functional Problem(s): Grooming;Bathing;Dressing;Toileting OT Advanced ADL's Functional Problem(s): Laundry;Simple Meal Preparation;Light Housekeeping OT Transfers Functional Problem(s): Toilet;Tub/Shower OT Additional Impairment(s): None OT Plan OT Intensity: Minimum of 1-2 x/day, 45 to 90 minutes OT Frequency: 5 out of 7 days OT Duration/Estimated Length of Stay: 3-5 days OT  Treatment/Interventions: Balance/vestibular training;Community reintegration;Discharge planning;Functional mobility training;Psychosocial support;Therapeutic Activities;Patient/family education;DME/adaptive equipment instruction;UE/LE Strength taining/ROM;Wheelchair propulsion/positioning;Therapeutic Exercise;Self Care/advanced ADL retraining OT Self Feeding Anticipated Outcome(s): n/a OT Basic Self-Care Anticipated Outcome(s): mod I overall OT Toileting Anticipated Outcome(s): mod I overall OT Bathroom Transfers Anticipated Outcome(s): mod I overall OT Recommendation Recommendations for Other Services: Other (comment) (none) Patient destination: Home Follow Up Recommendations: None Equipment Recommended: None recommended by OT   Skilled Therapeutic Intervention Upon entering the room, pt supine in bed with no c/o pain and agreeable to OT intervention. OT educated pt on OT purpose, POC, and goals with pt verbalizing understanding and agreement. Pt ambulated in room to obtain clothing items from suitcase and engaged in bathing at shower level. Pt standing during entire shower with overall supervision for task. Pt dressing from commode at mod I level for UB and supervision for LB clothing management. Pt ambulated 100' without use of AD to obtain hair dryer. Pt standing at sink to to dry hair with no UE support and overall supervision for task. Pt returning to bed at end of session with call bell and all needed items within reach.    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  Lives With: Spouse Prior Function Level of Independence: Independent with basic ADLs, Independent with homemaking with ambulation, Independent with transfers  Able to Take Stairs?: Reciprically Driving: Yes Vocation: 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; driving; VERY independent Vision/Perception  Vision- History Baseline Vision/History: No visual deficits Patient Visual Report: No change from baseline  Cognition Overall Cognitive Status: Within Functional Limits for tasks assessed Arousal/Alertness: Awake/alert Orientation Level: Person;Place;Situation Person: Oriented Place: Oriented Situation: Oriented Year: 2018 Month: March Day of Week: Correct Memory: Appears intact Immediate Memory Recall: Sock;Blue;Bed Memory Recall: Sock;Blue;Bed Memory Recall Sock: Without Cue Memory Recall Blue: Without Cue Memory Recall Bed: Without Cue Sensation Sensation Light Touch: Appears Intact Stereognosis: Not tested Hot/Cold: Appears Intact Proprioception: Appears Intact Coordination Gross Motor Movements are Fluid and Coordinated: Yes Fine Motor Movements are Fluid and Coordinated: Yes Motor  Motor Motor: Within Functional Limits Mobility  Bed Mobility Rolling Right: 6: Modified independent (Device/Increase time) Rolling Left: 6: Modified independent (Device/Increase time) Sit to Supine: 6: Modified independent (Device/Increase time) Transfers Sit to Stand: 5: Supervision;6: Modified independent (Device/Increase time)  Trunk/Postural Assessment  Cervical Assessment Cervical Assessment: Within Functional Limits Thoracic Assessment Thoracic Assessment: Within Functional Limits Lumbar Assessment Lumbar Assessment: Within Functional Limits Postural Control Postural Control: Within Functional Limits   Balance Balance Balance Assessed: Yes Dynamic Sitting Balance Sitting balance - Comments: mod I  Extremity/Trunk Assessment RUE Assessment RUE Assessment: Within Functional Limits LUE Assessment LUE Assessment: Within Functional Limits   See Function Navigator for Current Functional Status.   Refer to Care Plan for Long Term Goals  Recommendations for other services: None    Discharge Criteria: Patient will be discharged from OT if patient refuses treatment 3 consecutive times without medical reason, if treatment goals not met, if there is a change in medical status, if patient makes no progress towards goals or if patient is discharged from hospital.  The above assessment, treatment plan, treatment alternatives and goals were discussed and mutually agreed upon: by patient  Gypsy Decant 08/24/2016, 12:44 PM

## 2016-08-24 NOTE — Evaluation (Signed)
Physical Therapy Assessment and Plan  Patient Details  Name: Elaine Jackson MRN: [id number] Date of Birth: 1955/12/12  PT Diagnosis: Muscle weakness Rehab Potential:   ELOS:     Today's Date: 08/24/2016 PT Individual Time: 9024-0973 PT Individual Time Calculation (min): 75 min    Problem List:  Patient Active Problem List   Diagnosis Date Noted  . AKI (acute kidney injury) ([place])   . Acute blood loss anemia   . Leukocytosis   . Morbid obesity ([place])   . Cerebellar stroke ([place]) 08/23/2016  . Neurologic gait disorder   . History of migraine   . Benign essential HTN   . Diabetes mellitus type 2 in obese ([place])   . Slow transit constipation   . Acute CVA (cerebrovascular accident) ([place]) 08/21/2016  . Vertebral artery dissection ([place]) 08/20/2016  . Acute kidney injury ([place]) 08/20/2016  . Left thyroid nodule 08/20/2016  . Bradycardia 08/20/2016  . Abnormal urinalysis 08/20/2016  . Diabetes mellitus with complication ([place])   . Vertebrobasilar occlusive disease   . Chronic seasonal allergic rhinitis due to pollen 05/20/2016  . Secondary pulmonary hypertension 08/28/2015  . Old MI (myocardial infarction) 08/28/2015  . Ischemic heart disease 08/28/2015  . Coronary artery disease involving native coronary artery of native heart without angina pectoris 05/15/2015  . Migraine 12/21/2014  . Vitamin D deficiency 01/20/2013  . Nail dystrophy 09/14/2012  . Anemia, unspecified 06/08/2012  . Diabetes mellitus type II, controlled ([place]) 04/16/2012  . Essential hypertension, benign 04/16/2012  . Dyslipidemia 04/16/2012  . Class 2 obesity due to excess calories with serious comorbidity and body mass index (BMI) of 36.0 to 36.9 in adult 04/16/2012  . Hx of laparoscopic gastric banding 02/13/2011    Past Medical History:  Past Medical History:  Diagnosis Date  . Allergic rhinitis, cause unspecified   . Allergy   . CAD (coronary artery disease)    LHC 12/20/2014 showed small to moderate apical  infarction due to distal LAD stenosis with reestablished TIMI2 flow. No PCI. Recommended plavix for 1 yr and ASA for life.   . Chicken pox   . Diabetes mellitus   . Family history of breast cancer in first degree relative    sister age 59  . Heart murmur   . Hyperlipidemia   . Hypertension   . Malignant neoplasm skin of face 02/16/2007   Basal Cell Carcinoma  [place].  . Measles   . Obesity, unspecified   . Other chronic nonalcoholic liver disease   . Sleep related leg cramps   . Unspecified vitamin D deficiency    Past Surgical History:  Past Surgical History:  Procedure Laterality Date  . CARDIAC CATHETERIZATION N/A 12/20/2014   Procedure: Left Heart Cath and Coronary Angiography;  Surgeon: Sanda Klein, MD;  Location: [facility];  Service: Cardiovascular;  Laterality: N/A;  . CHOLECYSTECTOMY    . COLONOSCOPY  06/25/2007   [place]. Normal. Repeat in ten years.  Marland Kitchen LAPAROSCOPIC GASTRIC BANDING  07/03/10   Dr. Sherrin Daisy; [facility]  . TONSILLECTOMY      Assessment & Plan Clinical Impression:. SYMANTHA Jackson a 61 y.o.right handed femalewith history of CAD, HTN, T2DM, Chronic bradycardia, migraines in the past; who 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 She was admitted on 08/20/16 and reported constant HA radiating from right temporal area to periorbital and right posterior neck as well as photophobia. UDS negative.  CT head reviewed, no acute abnormality. CTA head/neck done revealing  tapering of right VA which becomes occluded at C5 concerning for dissection, non opacification of R-ACA and posterior inferior PCA compatible  Posterior fossa ischemia.  MRI/MRA brain done revealing multiple small foci of ischemia in right cerebellar hemisphere within PICA territory, occlusion of visualized portion of R-VA and right PICA. Neurology recommended adding Plavix to ASA for infarcts due to large vessel source and 30 day cardiac event monitoring recommended to  rule out A fib due to reports of palpitations at home.   Cardiology consulted due to bradycardia with HR in 30's and felt that worsening of chronic bradycardia likely due to compression of vagus nerve from vertebral dissection and felt that there was no indications for PPM at this time. Dr. Claiborne Billings recommends long term use of low dose ASA and Plavix. 2 D echo revealed EF 74-94%, grade 1 diastolic dysfunction and no wall abnormality. Loop recorder to be placed today  Patient currently requires supervision with mobility secondary to impaired timing and sequencing.  Prior to hospitalization, patient was independent  with mobility and lived with Spouse in a House home.  Home access is 2Stairs to enter.  Patient will benefit from skilled PT intervention to maximize safe functional mobility for planned discharge home with 24 hour assist.  Anticipate patient will benefit from follow up OP at discharge.  PT - End of Session Endurance Deficit: Yes Endurance Deficit Description: rest breaks needed with functional mobility  Skilled Therapeutic Intervention Patient in room ,agrees to therapy, no pain noted. Patient presents with min balance deficits and slight gait disturbance. Session focused on gait, balance testing and training as wel;l as endurance and conditioning. Patient is currently Mod I/Supervision  and will require only a short stay during this episode of care.  Balance training consisted of: uneven surface gait, side stepping, tandem stepping, backwards walking, single stance activities.. Gait training; on and off steps , over and around obstacle, sudden directional changes and head motion when walking.  For strength and conditioning patient performed 10 min of NuStep training with resistance level set at 5.  PT Evaluation Precautions/Restrictions Precautions Precautions: Fall Restrictions Weight Bearing Restrictions: No General   Vital SignsTherapy Vitals Temp: 98.2 F (36.8 C) Temp  Source: Oral Pulse Rate: 64 Resp: 16 BP: 128/74 Patient Position (if appropriate): Sitting Oxygen Therapy SpO2: 100 % O2 Device: Not Delivered Pain Pain Assessment Pain Assessment: No/denies pain Home Living/Prior Functioning Home Living Available Help at Discharge: Family;Available 24 hours/day Type of Home: House Home Access: Stairs to enter [employer] of Steps: 2 Entrance Stairs-Rails: None Home Layout: One level Bathroom Shower/Tub: Walk-in shower;Door [employer] Toilet: Standard Bathroom Accessibility: Yes Additional Comments: 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  Lives With: Spouse Prior Function Level of Independence: Independent with basic ADLs;Independent with homemaking with ambulation;Independent with transfers  Able to Take Stairs?: Reciprically Driving: Yes 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; driving; VERY independent Vision/Perception     Cognition Overall Cognitive Status: Within Functional Limits for tasks assessed Arousal/Alertness: Awake/alert Memory: Appears intact Sensation Sensation Light Touch: Appears Intact Stereognosis: Not tested Hot/Cold: Appears Intact Proprioception: Appears Intact Coordination Gross Motor Movements are Fluid and Coordinated: Yes Fine Motor Movements are Fluid and Coordinated: Yes Motor  Motor Motor: Within Functional Limits  Mobility Bed Mobility Rolling Right: 6: Modified independent (Device/Increase time) Rolling Left: 6: Modified independent (Device/Increase time) Sit to Supine: 6: Modified independent (Device/Increase time) Transfers Sit to Stand: 5: Supervision;6: Modified independent (  Device/Increase time) Locomotion     Trunk/Postural Assessment  Cervical Assessment Cervical Assessment: Within Functional Limits Thoracic Assessment Thoracic Assessment: Within Functional Limits Lumbar Assessment Lumbar  Assessment: Within Functional Limits Postural Control Postural Control: Within Functional Limits  Balance Balance Balance Assessed: Yes Dynamic Sitting Balance Sitting balance - Comments: mod I  Extremity Assessment  RUE Assessment RUE Assessment: Within Functional Limits LUE Assessment LUE Assessment: Within Functional Limits       See Function Navigator for Current Functional Status.   Refer to Care Plan for Long Term Goals  Recommendations for other services: None  and Therapeutic Recreation  Pet therapy  Discharge Criteria: Patient will be discharged from PT if patient refuses treatment 3 consecutive times without medical reason, if treatment goals not met, if there is a change in medical status, if patient makes no progress towards goals or if patient is discharged from hospital.  The above assessment, treatment plan, treatment alternatives and goals were discussed and mutually agreed upon: by patient  Guadlupe Spanish 08/24/2016, 4:04 PM

## 2016-08-24 NOTE — Progress Notes (Signed)
Occupational Therapy Session Note  Patient Details  Name: Elaine Jackson MRN: [id number] Date of Birth: Nov 03, 1955  Today's Date: 08/24/2016 OT Individual Time: 1417-1500 OT Individual Time Calculation (min): 43 min   Short Term Goals: Week 1:  OT Short Term Goal 1 (Week 1): STGs=LTGs secondary to estimated short LOS  Skilled Therapeutic Interventions/Progress Updates:  1:1 OT session focused on iADL/home management, activity tolerance, and LB strengthening. Pt ambulated to therapy apartment supervision/mod I w/ slight lateral sway with distance. Pt able to remember and sequence 4 step verbal instructions in kitchen with good safety awareness. Discussed home management and kitchen modifications. Home making completed in therapy apartment. Pt able to bend over w/o LOB, collect bedding, and make up bed mod I. Pt completed 6 mins, level 5 on Nustep for LB strengthening, then ambulated back to room. Pt left with needs met.    Balance/vestibular training;Community reintegration;Discharge planning;Functional mobility training;Psychosocial support;Therapeutic Activities;Patient/family education;DME/adaptive equipment instruction;UE/LE Strength taining/ROM;Wheelchair propulsion/positioning;Therapeutic Exercise;Self Care/advanced ADL retraining   Therapy Documentation Precautions:  Precautions Precautions: Fall Precaution Comments: Demonstrating decreased steadiness with ambulation with staggering to R. impulsive at times and moves quickly Restrictions Weight Bearing Restrictions: No Pain: Pain Assessment Pain Assessment: No/denies pain  See Function Navigator for Current Functional Status.  Therapy/Group: Individual Therapy  Valma Cava 08/24/2016, 2:49 PM

## 2016-08-24 NOTE — Progress Notes (Signed)
[facility] PHYSICAL MEDICINE & REHABILITATION     PROGRESS NOTE  Subjective/Complaints:  Pt seen laying in bed this AM.  She slept well overnight and is ready to being therapies.   ROS: Denies CP, SOB, N/V/D.  Objective: Vital Signs: Blood pressure 119/63, pulse (!) 59, temperature 98 F (36.7 C), temperature source Oral, resp. rate 18, height 5\' 2"  (1.575 m), weight 93.5 kg (206 lb 3.2 oz), SpO2 100 %. No results found.  Recent Labs  08/24/16 0514  WBC 10.7*  HGB 11.7*  HCT 35.8*  PLT 231    Recent Labs  08/24/16 0514  NA 138  K 4.1  CL 103  GLUCOSE 104*  BUN 21*  CREATININE 1.05*  CALCIUM 9.0   CBG (last 3)   Recent Labs  08/23/16 2034 08/24/16 0013 08/24/16 0420  GLUCAP 89 129* 103*    Wt Readings from Last 3 Encounters:  08/23/16 93.5 kg (206 lb 3.2 oz)  05/01/16 90.8 kg (200 lb 3.2 oz)  09/26/15 90.4 kg (199 lb 6.4 oz)    Physical Exam:  BP 119/63 (BP Location: Right Arm)   Pulse (!) 59   Temp 98 F (36.7 C) (Oral)   Resp 18   Ht 5\' 2"  (1.575 m)   Wt 93.5 kg (206 lb 3.2 oz)   SpO2 100%   BMI 37.71 kg/m  Constitutional: She appears well-developed and well-nourished.  HENT: Normocephalic and atraumatic.  Eyes: EOMI. No discharge.  Cardiovascular: Normal rate and regular rhythm.  No JVD. Respiratory: Effort normal and breath sounds normal.  GI: Soft. Bowel sounds are normal.  Musculoskeletal: She exhibits no edema or tenderness.  Neurological: She is alert and oriented.  Speech clear.  Follows commands without difficulty.  Motor: 4+-5/5 throughout Skin: Skin is warm and dry. No rash noted. No erythema.  Psychiatric: She has a normal mood and affect. Her behavior is normal.   Assessment/Plan: 1. Functional deficits secondary to right PICA infarct which require 3+ hours per day of interdisciplinary therapy in a comprehensive inpatient rehab setting. Physiatrist is providing close team supervision and 24 hour management of active medical  problems listed below. Physiatrist and rehab team continue to assess barriers to discharge/monitor patient progress toward functional and medical goals.  Function:  Bathing Bathing position      Bathing parts      Bathing assist        Upper Body Dressing/Undressing Upper body dressing                    Upper body assist        Lower Body Dressing/Undressing Lower body dressing                                  Lower body assist        Toileting Toileting   Toileting steps completed by patient: Adjust clothing prior to toileting, Performs perineal hygiene, Adjust clothing after toileting   Toileting Assistive Devices: Grab bar or rail  Toileting assist Assist level: Supervision or verbal cues   Transfers Chair/bed transfer             Locomotion Ambulation           Wheelchair          Cognition Comprehension Comprehension assist level: Follows complex conversation/direction with no assist  Expression Expression assist level: Expresses complex ideas: With no assist  Social Interaction  Social Interaction assist level: Interacts appropriately with others - No medications needed.  Problem Solving Problem solving assist level: Solves complex problems: Recognizes & self-corrects  Memory Memory assist level: Complete Independence: No helper    Medical Problem List and Plan: 1.  Gait abnormality, balance deficits secondary to right PICA infarct on 08/20/16.             Cont ASA and Plavix  Begin CIR 2.  DVT Prophylaxis/Anticoagulation: Pharmaceutical: Lovenox 3. Pain Management: PRN meds 4. Mood: Is pretty upbeat. LCSW to follow for evaluation and support.  5. Neuropsych: This patient is capable of making decisions on her own behalf. 6. Skin/Wound Care: routine pressure relief measures.  7. Fluids/Electrolytes/Nutrition: Monitor I/Os. Intake has improved.  8. Migraines: Headaches resolved.  9. HTN: Monitor BP bid  Off Hyzaar  Monitor  with increased mobility  Controlled 3/3 10 CAD with chronic bradycardia: to continue ASA/Plavix indefinitely.  11. T2DM: controlled with diet--Hgb A1c stable at 5.8. Will monitor BS ac/hs for now.   Monitor with increased mobility 12. Constipation: Will start bowel regimen  13. Morbid obesity             Body mass index is 37.71 kg/m.  Diet and exercise education  Encourage weight loss to increase endurance and promote overall health 14. Leukocytosis  Aebrile  WBCs 10.7 on 3/3  Cont to follow 15. Acute blood loss anemia  Hb 11.7 on 3/3  Cont to monitor 16. AKI  Cr 1.05 on 3/3  Cont to monitor   LOS (Days) 1 A FACE TO FACE EVALUATION WAS PERFORMED  Katyra Tomassetti Lorie Phenix 08/24/2016 8:00 AM

## 2016-08-25 LAB — GLUCOSE, CAPILLARY
Glucose-Capillary: 106 mg/dL — ABNORMAL HIGH (ref 65–99)
Glucose-Capillary: 93 mg/dL (ref 65–99)
Glucose-Capillary: 118 mg/dL — ABNORMAL HIGH (ref 65–99)
Glucose-Capillary: 91 mg/dL (ref 65–99)

## 2016-08-25 NOTE — Progress Notes (Signed)
PHYSICAL MEDICINE & REHABILITATION     PROGRESS NOTE  Subjective/Complaints:  Pt seen laying in bed this AM.  She states she slept well overnight and had a great day in therapies.   ROS: Denies CP, SOB, N/V/D.  Objective: Vital Signs: Blood pressure 107/64, pulse 61, temperature 98.3 F (36.8 C), temperature source Oral, resp. rate 16, height 5\' 2"  (1.575 m), weight 93.5 kg (206 lb 3.2 oz), SpO2 100 %. No results found.  Recent Labs  08/24/16 0514  WBC 10.7*  HGB 11.7*  HCT 35.8*  PLT 231    Recent Labs  08/24/16 0514  NA 138  K 4.1  CL 103  GLUCOSE 104*  BUN 21*  CREATININE 1.05*  CALCIUM 9.0   CBG (last 3)   Recent Labs  08/24/16 1631 08/24/16 1947 08/25/16 0637  GLUCAP 84 118* 106*    Wt Readings from Last 3 Encounters:  08/23/16 93.5 kg (206 lb 3.2 oz)  05/01/16 90.8 kg (200 lb 3.2 oz)  09/26/15 90.4 kg (199 lb 6.4 oz)    Physical Exam:  BP 107/64 (BP Location: Right Arm)   Pulse 61   Temp 98.3 F (36.8 C) (Oral)   Resp 16   Ht 5\' 2"  (1.575 m)   Wt 93.5 kg (206 lb 3.2 oz)   SpO2 100%   BMI 37.71 kg/m  Constitutional: She appears well-developed and well-nourished.  HENT: Normocephalic and atraumatic.  Eyes: EOMI. No discharge.  Cardiovascular: RRR.  No JVD. Respiratory: Effort normal and breath sounds normal.  GI: Soft. Bowel sounds are normal.  Musculoskeletal: She exhibits no edema or tenderness.  Neurological: She is alert and oriented.  Speech clear.  Follows commands without difficulty.  Motor: 5/5 throughout Skin: Skin is warm and dry. No rash noted. No erythema.  Psychiatric: She has a normal mood and affect. Her behavior is normal.   Assessment/Plan: 1. Functional deficits secondary to right PICA infarct which require 3+ hours per day of interdisciplinary therapy in a comprehensive inpatient rehab setting. Physiatrist is providing close team supervision and 24 hour management of active medical problems listed  below. Physiatrist and rehab team continue to assess barriers to discharge/monitor patient progress toward functional and medical goals.  Function:  Bathing Bathing position   Position: Shower  Bathing parts Body parts bathed by patient: Right arm, Left arm, Chest, Abdomen, Front perineal area, Buttocks, Right upper leg, Left upper leg, Right lower leg, Left lower leg    Bathing assist Assist Level: Supervision or verbal cues      Upper Body Dressing/Undressing Upper body dressing   What is the patient wearing?: Pull over shirt/dress     Pull over shirt/dress - Perfomed by patient: Thread/unthread right sleeve, Thread/unthread left sleeve, Put head through opening          Upper body assist Assist Level: Supervision or verbal cues      Lower Body Dressing/Undressing Lower body dressing   What is the patient wearing?: Underwear, Pants, Socks, Shoes Underwear - Performed by patient: Thread/unthread right underwear leg, Thread/unthread left underwear leg, Pull underwear up/down   Pants- Performed by patient: Thread/unthread right pants leg, Thread/unthread left pants leg, Pull pants up/down, Fasten/unfasten pants       Socks - Performed by patient: Don/doff right sock, Don/doff left sock   Shoes - Performed by patient: Don/doff right shoe, Don/doff left shoe            Lower body assist Assist for lower body dressing:  Supervision or verbal cues      Toileting Toileting   Toileting steps completed by patient: Adjust clothing prior to toileting, Performs perineal hygiene, Adjust clothing after toileting   Toileting Assistive Devices: Grab bar or rail  Toileting assist Assist level: No help/no cues   Transfers Chair/bed transfer     Chair/bed transfer assist level: Set up only       Locomotion Ambulation     Max distance: 200 Assist level: Supervision or verbal cues   Wheelchair          Cognition Comprehension Comprehension assist level: Follows  complex conversation/direction with no assist  Expression Expression assist level: Expresses complex ideas: With no assist  Social Interaction Social Interaction assist level: Interacts appropriately with others - No medications needed.  Problem Solving Problem solving assist level: Solves complex problems: Recognizes & self-corrects  Memory Memory assist level: Complete Independence: No helper    Medical Problem List and Plan: 1.  Gait abnormality, balance deficits secondary to right PICA infarct on 08/20/16.             Cont ASA and Plavix  Cont CIR, expect short LOS 2.  DVT Prophylaxis/Anticoagulation: Pharmaceutical: Lovenox 3. Pain Management: PRN meds 4. Mood: Is pretty upbeat. LCSW to follow for evaluation and support.  5. Neuropsych: This patient is capable of making decisions on her own behalf. 6. Skin/Wound Care: routine pressure relief measures.  7. Fluids/Electrolytes/Nutrition: Monitor I/Os. Intake has improved.  8. Migraines: Headaches resolved.  9. HTN: Monitor BP bid  Off Hyzaar  Monitor with increased mobility  Controlled 3/4 10 CAD with chronic bradycardia: to continue ASA/Plavix indefinitely.  11. T2DM: controlled with diet--Hgb A1c stable at 5.8. Will monitor BS ac/hs for now.   Monitor with increased mobility  Controlled 3/4 12. Constipation: Will start bowel regimen  13. Morbid obesity             Body mass index is 37.71 kg/m.  Diet and exercise education  Encourage weight loss to increase endurance and promote overall health 14. Leukocytosis  Aebrile  WBCs 10.7 on 3/3  Cont to follow 15. Acute blood loss anemia  Hb 11.7 on 3/3  Cont to monitor 16. AKI  Cr 1.05 on 3/3, improving  Cont to monitor   LOS (Days) 2 A FACE TO FACE EVALUATION WAS PERFORMED  Neta Upadhyay Lorie Phenix 08/25/2016 7:28 AM

## 2016-08-26 ENCOUNTER — Inpatient Hospital Stay (HOSPITAL_COMMUNITY): Payer: BLUE CROSS/BLUE SHIELD | Admitting: Occupational Therapy

## 2016-08-26 ENCOUNTER — Inpatient Hospital Stay (HOSPITAL_COMMUNITY): Payer: BLUE CROSS/BLUE SHIELD

## 2016-08-26 ENCOUNTER — Encounter (HOSPITAL_COMMUNITY): Payer: Self-pay | Admitting: Internal Medicine

## 2016-08-26 LAB — GLUCOSE, CAPILLARY
Glucose-Capillary: 106 mg/dL — ABNORMAL HIGH (ref 65–99)
Glucose-Capillary: 100 mg/dL — ABNORMAL HIGH (ref 65–99)

## 2016-08-26 MED ORDER — CLOPIDOGREL BISULFATE 75 MG PO TABS: 75.0000 mg | ORAL_TABLET | Freq: Every day | ORAL | 0 refills | Status: DC

## 2016-08-26 MED ORDER — ATORVASTATIN CALCIUM 80 MG PO TABS: ORAL_TABLET | ORAL | 0 refills | Status: DC

## 2016-08-26 MED ORDER — POLYETHYLENE GLYCOL 3350 17 G PO PACK: 17.0000 g | PACK | Freq: Every day | ORAL | 0 refills | Status: DC

## 2016-08-26 MED ORDER — ASPIRIN 325 MG PO TBEC: 325.0000 mg | DELAYED_RELEASE_TABLET | Freq: Every day | ORAL | 0 refills | Status: DC

## 2016-08-26 MED ORDER — VITAMIN D 1000 UNITS PO TABS: 1000.0000 [IU] | ORAL_TABLET | Freq: Every day | ORAL | Status: DC

## 2016-08-26 NOTE — Discharge Instructions (Signed)
Inpatient Rehab Discharge Instructions  Elaine Jackson Discharge date and time:  08/26/16  Activities/Precautions/ Functional Status: Activity: no lifting, driving, or strenuous exercise for till cleared by MD Diet: cardiac diet Wound Care: none needed   Functional status:  ___ No restrictions     ___ Walk up steps independently ___ 24/7 supervision/assistance   ___ Walk up steps with assistance ___ Intermittent supervision/assistance  _X__ Bathe/dress independently ___ Walk with walker     ___ Bathe/dress with assistance _X__ Walk Independently    ___ Shower independently ___ Walk with assistance    ___ Shower with assistance _X__ No alcohol     ___ Return to work/school ________  Special Instructions:   COMMUNITY REFERRALS UPON DISCHARGE:   None:  NO THERAPIES OR EQUIPMENT RECOMMENDED DUE TO PT'S INDEPENDENT LEVEL   GENERAL COMMUNITY RESOURCES FOR PATIENT/FAMILY: Support Groups:[facility] SUPPORT GROUP EVERY SECOND Thursday @ 3:00-4:00 PM ON THE REHAB UNIT QUESTIONS CONTACT [facility] [id number]  STROKE/TIA DISCHARGE INSTRUCTIONS SMOKING Cigarette smoking nearly doubles your risk of having a stroke & is the single most alterable risk factor  If you smoke or have smoked in the last 12 months, you are advised to quit smoking for your health.  Most of the excess cardiovascular risk related to smoking disappears within a year of stopping.  Ask you doctor about anti-smoking medications  [place] Quit Line: 1-800-QUIT NOW  Free Smoking Cessation Classes [phone]  CHOLESTEROL Know your levels; limit fat & cholesterol in your diet  Lipid Panel     Component Value Date/Time   CHOL 128 08/21/2016 0147   TRIG 56 08/21/2016 0147   HDL 53 08/21/2016 0147   CHOLHDL 2.4 08/21/2016 0147   VLDL 11 08/21/2016 0147   LDLCALC 64 08/21/2016 0147      Many patients benefit from treatment even if their cholesterol is at goal.  Goal: Total Cholesterol (CHOL) less than 160  Goal:   Triglycerides (TRIG) less than 150  Goal:  HDL greater than 40  Goal:  LDL (LDLCALC) less than 100   BLOOD PRESSURE American Stroke Association blood pressure target is less that 120/80 mm/Hg  Your discharge blood pressure is:  BP: 121/60  Monitor your blood pressure  Limit your salt and alcohol intake  Many individuals will require more than one medication for high blood pressure  DIABETES (A1c is a blood sugar average for last 3 months) Goal HGBA1c is under 7% (HBGA1c is blood sugar average for last 3 months)  Diabetes: No known diagnosis of diabetes    Lab Results  Component Value Date   HGBA1C 5.8 (H) 08/21/2016     Your HGBA1c can be lowered with medications, healthy diet, and exercise.  Check your blood sugar as directed by your physician  Call your physician if you experience unexplained or low blood sugars.  PHYSICAL ACTIVITY/REHABILITATION Goal is 30 minutes at least 4 days per week  Activity: No driving, Therapies: N/A Return to work: to be decided on follow up  Activity decreases your risk of heart attack and stroke and makes your heart stronger.  It helps control your weight and blood pressure; helps you relax and can improve your mood.  Participate in a regular exercise program.  Talk with your doctor about the best form of exercise for you (dancing, walking, swimming, cycling).  DIET/WEIGHT Goal is to maintain a healthy weight  Your discharge diet is: Diet Heart Room service appropriate? Yes; Fluid consistency: Thin  liquids Your height is:  Height: 5'  2" (157.5 cm) Your current weight is: Weight: 93.5 kg (206 lb 3.2 oz) Your Body Mass Index (BMI) is:  BMI (Calculated): 37.8  Following the type of diet specifically designed for you will help prevent another stroke.  Your goal weight is:    Your goal Body Mass Index (BMI) is 19-24.  Healthy food habits can help reduce 3 risk factors for stroke:  High cholesterol, hypertension, and excess weight.  RESOURCES  Stroke/Support Group:  Call [phone]   STROKE EDUCATION PROVIDED/REVIEWED AND GIVEN TO PATIENT Stroke warning signs and symptoms How to activate emergency medical system (call 911). Medications prescribed at discharge. Need for follow-up after discharge. Personal risk factors for stroke. Pneumonia vaccine given:  Flu vaccine given:  My questions have been answered, the writing is legible, and I understand these instructions.  I will adhere to these goals & educational materials that have been provided to me after my discharge from the hospital.     My questions have been answered and I understand these instructions. I will adhere to these goals and the provided educational materials after my discharge from the hospital.  Patient/Caregiver Signature _______________________________ Date __________  Clinician Signature _______________________________________ Date __________  Please bring this form and your medication list with you to all your follow-up doctor's appointments.

## 2016-08-26 NOTE — Progress Notes (Signed)
Social Work Assessment and Plan Social Work Assessment and Plan  Patient Details  Name: Elaine Jackson MRN: [id number] Date of Birth: [birth year]  Today's Date: 08/26/2016  Problem List:  Patient Active Problem List   Diagnosis Date Noted  . AKI (acute kidney injury) ([place])   . Acute blood loss anemia   . Leukocytosis   . Morbid obesity ([place])   . Cerebellar stroke ([place]) 08/23/2016  . Neurologic gait disorder   . History of migraine   . Benign essential HTN   . Diabetes mellitus type 2 in obese ([place])   . Slow transit constipation   . Acute CVA (cerebrovascular accident) ([place]) 08/21/2016  . 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 Acute kidney injury ([place]) 08/20/2016  . Left thyroid nodule 08/20/2016  . Bradycardia 08/20/2016  . Abnormal urinalysis 08/20/2016  . Diabetes mellitus with complication ([place])   . Vertebrobasilar occlusive disease   . Chronic seasonal allergic rhinitis due to pollen 05/20/2016  . Secondary pulmonary hypertension 08/28/2015  . Old MI (myocardial infarction) 08/28/2015  . Ischemic heart disease 08/28/2015  . Coronary artery disease involving native coronary artery of native heart without angina pectoris 05/15/2015  . Migraine 12/21/2014  . Vitamin D deficiency 01/20/2013  . Nail dystrophy 09/14/2012  . Anemia, unspecified 06/08/2012  . Diabetes mellitus type II, controlled ([place]) 04/16/2012  . Essential hypertension, benign 04/16/2012  . Dyslipidemia 04/16/2012  . Class 2 obesity due to excess calories with serious comorbidity and body mass index (BMI) of 36.0 to 36.9 in adult 04/16/2012  . Hx of laparoscopic gastric banding 02/13/2011   Past Medical History:  Past Medical History:  Diagnosis Date  . Allergic rhinitis, cause unspecified   . Allergy   . CAD (coronary artery disease)    LHC 12/20/2014 showed small to moderate apical infarction due to distal LAD stenosis with reestablished TIMI2 flow. No PCI. Recommended plavix for 1 yr and ASA for  life.   . Chicken pox   . Diabetes mellitus   . Family history of breast cancer in first degree relative    sister age 46  . Heart murmur   . Hyperlipidemia   . Hypertension   . Malignant neoplasm skin of face 02/16/2007   Basal Cell Carcinoma  [place].  . Measles   . Obesity, unspecified   . Other chronic nonalcoholic liver disease   . Sleep related leg cramps   . Unspecified vitamin D deficiency    Past Surgical History:  Past Surgical History:  Procedure Laterality Date  . CARDIAC CATHETERIZATION N/A 12/20/2014   Procedure: Left Heart Cath and Coronary Angiography;  Surgeon: Sanda Klein, MD;  Location: [facility];  Service: Cardiovascular;  Laterality: N/A;  . CHOLECYSTECTOMY    . COLONOSCOPY  06/25/2007   [place]. Normal. Repeat in ten years.  Marland Kitchen LAPAROSCOPIC GASTRIC BANDING  07/03/10   Dr. Sherrin Daisy; [facility]  . LOOP RECORDER INSERTION N/A 08/23/2016   Procedure: Loop Recorder Insertion;  Surgeon: Deboraha Sprang, MD;  Location: [facility];  Service: Cardiovascular;  Laterality: N/A;  . TONSILLECTOMY     Social History:  reports that she has quit smoking. Her smoking use included Cigarettes. She has a 25.00 pack-year smoking history. She has never used smokeless tobacco. She reports that she does not drink alcohol or use drugs.  Family / Support Systems Marital Status: Married Patient Roles: Spouse, Parent, Other (Comment) (employee) Spouse/Significant Other: Araceli Bouche  [phone] Children: Learta Codding daughter Other Supports: [name]-sister  Anticipated Caregiver: husband and sister Ability/Limitations of Caregiver: none Caregiver Availability: 24/7 Family Dynamics: Close knit family who are willing to assist if needed. Pt is currently at a independent level and will not need care upon discharge. Good family and friends support  Social History Preferred language: English Religion: Baptist Cultural Background: No issues Education: Public affairs consultant Read: Yes Write: Yes Employment Status: Employed Name of Employer: [employer] Return to Work Plans: Plans to return once cleared by MD Legal Hisotry/Current Legal Issues: No issues Guardian/Conservator: None-according to MD pt is capable of making her own decisions while here   Abuse/Neglect Physical Abuse: Denies Verbal Abuse: Denies Sexual Abuse: Denies Exploitation of patient/patient's resources: Denies Self-Neglect: Denies  Emotional Status Pt's affect, behavior adn adjustment status: Pt is so grateful she has recovered from this stroke and is ready to go home today. She feels it is due to getitng her so quikcly when it happened. She is also very active and hopes this is what has helped her recover. Recent Psychosocial Issues: other health issues-loop recorder now Pyschiatric History: No history appears to be coping appropriately and going home today after therapies Substance Abuse History: No issues  Patient / Family Perceptions, Expectations & Goals Pt/Family understanding of illness & functional limitations: Pt is able to explain her stroke and the deficits she had but now are gone. She does talk with the MD and feels her questions and concerns are being addressed. Premorbid pt/family roles/activities: Wife, MOther, Employee, Friend, grandmother, etc Anticipated changes in roles/activities/participation: resume Pt/family expectations/goals: Pt states: "I am glad to be doing so well and will do wahtever I need to do to recover from this."  [employer]: None Premorbid Home Care/DME Agencies: None Transportation available at discharge: Husband and family Resource referrals recommended: Support group (specify)  Discharge Planning Living Arrangements: Spouse/significant other Support Systems: Spouse/significant other, Children, Other relatives, Friends/neighbors, Social worker community Type of Residence: Private  residence Insurance underwriter Resources:  Nurse, mental health) Financial Resources: Employment, Family Support Financial Screen Referred: No Living Expenses: Own Money Management: Spouse, Patient Does the patient have any problems obtaining your medications?: No Home Management: Patient  Patient/Family Preliminary Plans: Return home with husband who works during the day but her sister can stay for a short time with her if needed. She is independent in her room currently and going home today. Social Work Anticipated Follow Up Needs: Support Group  Clinical Impression Pleasant motivated patient who is extremely grateful with her quick recovery from this stroke. She feels the loop recorder will help in finding out if her heart is attributing to this. Ready to go home today.   [name] 08/26/2016, 9:07 AM

## 2016-08-26 NOTE — Patient Care Conference (Signed)
Inpatient RehabilitationTeam Conference and Plan of Care Update Date: 08/26/2016   Time: 12:51 PM    Patient Name: Elaine Jackson      Medical Record Number: [id number]  Date of Birth: 06-Feb-1956 Sex: Female         Room/Bed: 4W02C/4W02C-01 Payor Info: Payor: BLUE CROSS BLUE [employer]IELD / Plan: BCBS OTHER / Product Type: *No Product type* /    Admitting Diagnosis: CVA Loop Recorder  Admit Date/Time:  08/23/2016  4:57 PM Admission Comments: No comment available   Primary Diagnosis:  <principal problem not specified> Principal Problem: <principal problem not specified>  Patient Active Problem List   Diagnosis Date Noted  . AKI (acute kidney injury) ([place])   . Acute blood loss anemia   . Leukocytosis   . Morbid obesity ([place])   . Cerebellar stroke ([place]) 08/23/2016  . Neurologic gait disorder   . History of migraine   . Benign essential HTN   . Diabetes mellitus type 2 in obese ([place])   . Slow transit constipation   . Acute CVA (cerebrovascular accident) ([place]) 08/21/2016  . Vertebral artery dissection ([place]) 08/20/2016  . Acute kidney injury ([place]) 08/20/2016  . Left thyroid nodule 08/20/2016  . Bradycardia 08/20/2016  . Abnormal urinalysis 08/20/2016  . Diabetes mellitus with complication ([place])   . Vertebrobasilar occlusive disease   . Chronic seasonal allergic rhinitis due to pollen 05/20/2016  . Secondary pulmonary hypertension 08/28/2015  . Old MI (myocardial infarction) 08/28/2015  . Ischemic heart disease 08/28/2015  . Coronary artery disease involving native coronary artery of native heart without angina pectoris 05/15/2015  . Migraine 12/21/2014  . Vitamin D deficiency 01/20/2013  . Nail dystrophy 09/14/2012  . Anemia, unspecified 06/08/2012  . Diabetes mellitus type II, controlled ([place]) 04/16/2012  . Essential hypertension, benign 04/16/2012  . Dyslipidemia 04/16/2012  . Class 2 obesity due to excess calories with serious comorbidity and body mass index (BMI) of 36.0 to 36.9 in  adult 04/16/2012  . Hx of laparoscopic gastric banding 02/13/2011    Expected Discharge Date: Expected Discharge Date: 08/26/16  Team Members Present: Physician leading conference: Dr. Alysia Penna Social Worker Present: Ovidio Kin, LCSW Nurse Present: Dorien Chihuahua, RN PT Present: Georjean Mode, PT OT Present: Clyda Greener, OT SLP Present: Gunnar Fusi, SLP PPS Coordinator present : Daiva Nakayama, RN, CRRN     Current Status/Progress Goal Weekly Team Focus  Medical     ready for DC   medically stable     Bowel/Bladder        Cont B & B     Swallow/Nutrition/ Hydration     na        ADL's        independent level     Mobility        independent level     Communication     na        Safety/Cognition/ Behavioral Observations    na        Pain        less than 3-headache managed     Skin        no issues        *See Care Plan and progress notes for long and short-term goals.  Barriers to Discharge:   none   Possible Resolutions to Barriers:    none   Discharge Planning/Teaching Needs:    Home with husband who works during the day but friend scan check on and children. Pt back to independent  level     Team Discussion:  Goals-independent level, medically stable and ready to go home. No follow up recommended or equipment. Recovered from her stroke  Revisions to Treatment Plan:  DC 3/5      [name] 08/26/2016, 12:51 PM

## 2016-08-26 NOTE — Progress Notes (Signed)
Occupational Therapy Discharge Summary  Patient Details  Name: Elaine Jackson MRN: [id number] Date of Birth: 1956-06-13  Today's Date: 08/26/2016 OT Individual Time: 1101-1159 and 1300-1325 OT Individual Time Calculation (min): 58 min and 25 min    Patient has met 12 of 12 long term goals due to improved activity tolerance, improved balance and ability to compensate for deficits.  Patient to discharge at overall Modified Independent level.    Reasons goals not met: all goals met  Recommendation:  Pt does not need any follow up OT services after discharge from hospital.   Equipment: No equipment provided  Reasons for discharge: treatment goals met  Patient/family agrees with progress made and goals achieved: Yes   OT Intervention:  Session 1: Upon entering the room, pt seated in recliner chair awaiting therapist. Pt given 3 items to remember and locate in gift shop. Pt ambulating 300'+ onto elevator and down to gift shop. Pt navigating the aisles and locating the exact item with increased time. Pt going up 2 flights of steps without use of rail with mod I. Pt returning to room at end of session and OT reviewing discharge recommendations with pt. Pt verbalized understanding and agreement with therapist.  Session 2: Upon entering the room, pt seated in recliner awaiting therapist. Pt with no c/o pain this session. Pt ambulates into day room without use of AD at mod I level. Pt engaged in 2 sets of 10 squats with rest break between sets. Pt engaged in B high knee x 20 on each with rest break needed. Pt picking up leaves from plants on floor without difficulty and obtaining watering pitcher. Pt filled pitcher and watered various plants in room without difficulty. Pt returned to room at end of session. Pt with no further questions. Call bell and all needed items within reach upon exiting the room.   OT Discharge Precautions/Restrictions  Precautions Precautions: None Restrictions Weight  Bearing Restrictions: No Pain Pain Assessment Pain Assessment: No/denies pain Vision/Perception  Vision- History Baseline Vision/History: No visual deficits Patient Visual Report: No change from baseline  Cognition Overall Cognitive Status: Within Functional Limits for tasks assessed Arousal/Alertness: Awake/alert Orientation Level: Oriented X4 Memory: Appears intact Safety/Judgment: Appears intact Sensation Sensation Light Touch: Appears Intact Stereognosis: Appears Intact Hot/Cold: Appears Intact Proprioception: Appears Intact Coordination Gross Motor Movements are Fluid and Coordinated: Yes Fine Motor Movements are Fluid and Coordinated: Yes Motor  Motor Motor: Within Functional Limits Mobility  Bed Mobility Bed Mobility: Rolling Right;Rolling Left;Sit to Supine Rolling Right: 6: Modified independent (Device/Increase time) Rolling Left: 6: Modified independent (Device/Increase time) Sit to Supine: 6: Modified independent (Device/Increase time) Transfers Sit to Stand: 6: Modified independent (Device/Increase time)  Trunk/Postural Assessment  Cervical Assessment Cervical Assessment: Within Functional Limits Thoracic Assessment Thoracic Assessment: Within Functional Limits Lumbar Assessment Lumbar Assessment: Within Functional Limits Postural Control Postural Control: Within Functional Limits  Balance Balance Balance Assessed: Yes Standardized Balance Assessment Standardized Balance Assessment: Berg Balance Test;Dynamic Gait Index (Berg and DGI 08/24/16) Berg Balance Test Sit to Stand: Able to stand  independently using hands Standing Unsupported: Able to stand safely 2 minutes Sitting with Back Unsupported but Feet Supported on Floor or Stool: Able to sit safely and securely 2 minutes Stand to Sit: Sits safely with minimal use of hands Transfers: Able to transfer safely, minor use of hands Standing Unsupported with Eyes Closed: Able to stand 10 seconds with  supervision Standing Ubsupported with Feet Together: Able to place feet together independently and stand for 1  minute with supervision From Standing, Reach Forward with Outstretched Arm: Can reach confidently >25 cm (10") From Standing Position, Pick up Object from Floor: Able to pick up shoe safely and easily From Standing Position, Turn to Look Behind Over each Shoulder: Looks behind from both sides and weight shifts well Turn 360 Degrees: Able to turn 360 degrees safely in 4 seconds or less Standing Unsupported, Alternately Place Feet on Step/Stool: Able to stand independently and safely and complete 8 steps in 20 seconds Standing Unsupported, One Foot in Front: Able to plae foot ahead of the other independently and hold 30 seconds Standing on One Leg: Able to lift leg independently and hold 5-10 seconds Total Score: 51 Dynamic Gait Index Level Surface: Mild Impairment Change in Gait Speed: Mild Impairment Gait with Horizontal Head Turns: Mild Impairment Gait with Vertical Head Turns: Mild Impairment Gait and Pivot Turn: Mild Impairment Step Over Obstacle: Mild Impairment Step Around Obstacles: Mild Impairment Steps: Mild Impairment Total Score: 16 Dynamic Sitting Balance Sitting balance - Comments: independent sitting and standing Extremity/Trunk Assessment RUE Assessment RUE Assessment: Within Functional Limits LUE Assessment LUE Assessment: Within Functional Limits   See Function Navigator for Current Functional Status.  Darleen Crocker P 08/26/2016, 1:32 PM

## 2016-08-26 NOTE — Progress Notes (Signed)
Occupational Therapy Session Note  Patient Details  Name: Elaine Jackson MRN: [id number] Date of Birth: 02-11-1956  Today's Date: 08/26/2016 OT Individual Time: 1001-1100 OT Individual Time Calculation (min): 59 min    Short Term Goals: Week 1:  OT Short Term Goal 1 (Week 1): STGs=LTGs secondary to estimated short LOS  Skilled Therapeutic Interventions/Progress Updates:    Pt worked on functional mobility without assistive device to start session.  She was able to ambulate around the unit with independence, stepping over objects without difficulty as needed.  She was able to complete use of the Dynavision with 98% accuracy on 2 second intervals with 100% accuracy on 3 digit numbers simultaneously.  Had her walk down to the kitchen where she completed simple meal prep of making an egg with independence as well.  Next ambulated to the therapy gym for use of the [employer].  She scored 97% on  Random Control program for 2 mins with stable floor setting.  Finished working on balance using foam wedge to stand while using the rebounder.  Once LOB noted but pt able to self correct without therapist assist.  Ambulated back to the room at conclusion of session.  Pt left in room with modified independent sign hanging on the door.    Therapy Documentation Precautions:  Precautions Precautions: None Precaution Comments: Demonstrating decreased steadiness with ambulation with staggering to R. impulsive at times and moves quickly Restrictions Weight Bearing Restrictions: No  Pain: Pain Assessment Pain Assessment: No/denies pain Pain Score: 0-No pain ADL:  See Function Navigator for Current Functional Status.   Therapy/Group: Individual Therapy  Darlys Buis OTR/L 08/26/2016, 12:32 PM

## 2016-08-26 NOTE — Discharge Summary (Signed)
Physician Discharge Summary  Patient ID: Elaine Jackson MRN: [id number] DOB/AGE: 03-12-1956 61 y.o.  Admit date: 08/23/2016 Discharge date: 08/26/2016  Discharge Diagnoses:  Principal Problem:   Cerebellar stroke [facility]) Active Problems:   AKI (acute kidney injury) ([place])   Acute blood loss anemia   Leukocytosis   Morbid obesity ([place])   Discharged Condition: stable   Significant Diagnostic Studies: N/A   Labs:  Basic Metabolic Panel: BMP Latest Ref Rng & Units 08/24/2016 08/20/2016 08/20/2016  Glucose 65 - 99 mg/dL 104(H) 181(H) 177(H)  BUN 6 - 20 mg/dL 21(H) 35(H) 26(H)  Creatinine 0.44 - 1.00 mg/dL 1.05(H) 1.20(H) 1.13(H)  Sodium 135 - 145 mmol/L 138 139 137  Potassium 3.5 - 5.1 mmol/L 4.1 4.4 4.4  Chloride 101 - 111 mmol/L 103 102 102  CO2 22 - 32 mmol/L 28 - 22  Calcium 8.9 - 10.3 mg/dL 9.0 - 9.8    CBC:  Recent Labs Lab 08/20/16 1127 08/20/16 1155 08/24/16 0514  WBC 9.0  --  10.7*  NEUTROABS 5.1  --  7.0  HGB 12.2 12.6 11.7*  HCT 37.8 37.0 35.8*  MCV 93.6  --  94.0  PLT 243  --  231    CBG:  Recent Labs Lab 08/25/16 1127 08/25/16 1639 08/25/16 2055 08/26/16 0646 08/26/16 1117  GLUCAP 93 118* 91 106* 100*    Brief HPI:   Elaine Jackson a 61 y.o.right handed femalewith history of CAD, HTN, T2DM, Chronic bradycardia, migraines in the past; who 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 She was admitted on 08/20/16 and reported constant HA radiating from right temporal area to periorbital and right posterior neck as well as photophobia.  CTA head/neck done revealing tapering of right VA which becomes occluded at C5 concerning for dissection, non opacification of R-ACA and posterior inferior PCA compatible  Posterior fossa ischemia.  MRI/MRA brain done revealing multiple small foci of ischemia in right cerebellar hemisphere within PICA territory, occlusion of visualized portion of R-VA and right PICA. Neurology recommended adding Plavix to ASA  for infarcts due to large vessel source.   Cardiology consulted due to bradycardia and felt that worsening of chronic bradycardia likely due to compression of vagus nerve from vertebral dissection and felt that there was no indications for PPM at this time. Dr. Claiborne Billings recommends long term use of low dose ASA and Plavix. 2 D echo revealed EF [id number], grade 1 diastolic dysfunction and no wall abnormality. Loop recorder to be placed today.  Therapy ongoing and and patient with staggering gait and poor safety awareness.  CIR recommended for follow up therapy.    Hospital Course: Elaine Jackson was admitted to rehab 08/23/2016 for inpatient therapies to consist of PT, ST and OT at least three hours five days a week. Past admission physiatrist, therapy team and rehab RN have worked together to provide customized collaborative inpatient rehab. Blood pressures were monitored bid and have been controlled off medications. Heart rate has ranged from 59-64 bpm and no reports of dizziness or presyncope with activity.  Po intake has been good and she is continent of bowel an bladder. Follow up check of lytes shows improvement in renal status. CBC revealed mild ABLA and leucocytosis but no signs of infection noted.  Blood sugars have been controlled on diet alone. She has made great progress and is independent at discharge. No follow up therapy needed.    Rehab course: At admission, patient required supervision with mobility and  basic self care tasks. Brief team conference held due to short length of stay and great progress. She has demonstrated  improved activity tolerance, improved balance and ability to compensate for deficits.  She is modified independent for ADL tasks and is ambulating 500' without AD. Berg balance score is 51/56 and she was educated on fall recovery as well as transitioning to prior level of activity.  No family education needed due to her progress.    Disposition: 01-Home or Self Care  Diet: Heart  Healthy.  Carb Modified.   Special Instructions: 1. No driving. No strenuous activity. Do not return to work till cleared by MD. 2. Needs BMET/CBC rechecked on post hospital follow up.    Discharge Instructions    Ambulatory referral to Physical Medicine Rehab    Complete by:  As directed    1-2 weeks transitional care appt     Allergies as of 08/26/2016      Reactions   Sulfa Antibiotics Nausea And Vomiting   Zofran [ondansetron Hcl] Other (See Comments)   Oral numbness with IV dosing.       Medication List    STOP taking these medications   losartan-hydrochlorothiazide 100-25 MG tablet Commonly known as:  HYZAAR   nitroGLYCERIN 0.4 MG SL tablet Commonly known as:  NITROSTAT     TAKE these medications   aspirin 325 MG EC tablet Take 1 tablet (325 mg total) by mouth daily. Start taking on:  08/27/2016 What changed:  medication strength  how much to take   atorvastatin 80 MG tablet Commonly known as:  LIPITOR TAKE 1 TABLET (80 MG TOTAL) BY MOUTH DAILY AT 6 PM. What changed:  how much to take  how to take this  when to take this  additional instructions   cholecalciferol 1000 units tablet Commonly known as:  VITAMIN D Take 1 tablet (1,000 Units total) by mouth daily.   clopidogrel 75 MG tablet Commonly known as:  PLAVIX Take 1 tablet (75 mg total) by mouth daily. Start taking on:  08/27/2016   fluticasone 50 MCG/ACT nasal spray Commonly known as:  FLONASE Place 2 sprays into both nostrils daily as needed for allergies.   montelukast 10 MG tablet Commonly known as:  SINGULAIR TAKE 1 TABLET (10 MG TOTAL) BY MOUTH AT BEDTIME. What changed:  how much to take  how to take this  when to take this  additional instructions   polyethylene glycol packet Commonly known as:  MIRALAX / GLYCOLAX Take 17 g by mouth daily. Start taking on:  08/27/2016   VITAMIN B-12 PO Take 1 tablet by mouth daily.      Follow-up Information    SMITH,KRISTI, MD Follow  up on 08/28/2016.   Specialty:  Family Medicine Why:  Appointment @ 2:30 PM Contact information: [place] [id number]        Charlett Blake, MD Follow up.   Specialty:  Physical Medicine and Rehabilitation Why:  office will call you with follow up appointment  Contact information: [place] [address] [phone]        Xu,Jindong, MD. Call.   Specialty:  Neurology Why:  for follow up appointment in 4-6 weeks Contact information: [address] [place] [address] [phone]           Signed: Bary Leriche 08/26/2016, 5:29 PM

## 2016-08-26 NOTE — Progress Notes (Signed)
Patient discussed the discharged instructions with PA before she left the hospital. 

## 2016-08-26 NOTE — IPOC Note (Addendum)
Overall Plan of Care [facility]) Patient Details Name: Elaine Jackson MRN: [id number] DOB: 05-21-1956  Admitting Diagnosis: CVA Loop Recorder  Hospital Problems: Active Problems:   Cerebellar stroke ([place])   AKI (acute kidney injury) ([place])   Acute blood loss anemia   Leukocytosis   Morbid obesity ([place])     Functional Problem List: Nursing Bowel, Edema, Endurance, Medication Management, Safety  PT Balance  OT Balance, Endurance, Motor, Safety  SLP    TR         Basic ADL's: OT Grooming, Bathing, Dressing, Toileting     Advanced  ADL's: OT Laundry, Simple Meal Preparation, Light Housekeeping     Transfers: PT Floor  OT Toilet, Tub/Shower     Locomotion: PT Ambulation     Additional Impairments: OT None  SLP        TR      Anticipated Outcomes Item Anticipated Outcome  Self Feeding n/a  Swallowing      Basic self-care  mod I overall  Toileting  mod I overall   Bathroom Transfers mod I overall  Bowel/Bladder  Mod I  Transfers  Independance  Locomotion  Independance  Communication     Cognition     Pain  3 or less  Safety/Judgment  Mod I   Therapy Plan: PT Intensity: Minimum of 1-2 x/day ,45 to 90 minutes PT Frequency: 5 out of 7 days PT Duration Estimated Length of Stay: 5 days OT Intensity: Minimum of 1-2 x/day, 45 to 90 minutes OT Frequency: 5 out of 7 days OT Duration/Estimated Length of Stay: 3-5 days         Team Interventions: Nursing Interventions Patient/Family Education, Bowel Management, Disease Management/Prevention, Medication Management  PT interventions Ambulation/gait training, Balance/vestibular training, Therapeutic Activities, Therapeutic Exercise, Patient/family education, Functional mobility training, UE/LE Strength taining/ROM, Neuromuscular re-education  OT Interventions Balance/vestibular training, Community reintegration, Discharge planning, Functional mobility training, Psychosocial support, Therapeutic Activities,  Patient/family education, DME/adaptive equipment instruction, UE/LE Strength taining/ROM, Wheelchair propulsion/positioning, Therapeutic Exercise, Self Care/advanced ADL retraining  SLP Interventions    TR Interventions    SW/CM Interventions  Psychosocial Assessment, Discharge Planning, Pt/family education    Team Discharge Planning: Destination: PT-Home ,OT- Home , SLP-  Projected Follow-up: PT-Outpatient PT, OT-  None, SLP-  Projected Equipment Needs: PT-To be determined, OT- None recommended by OT, SLP-  Equipment Details: PT- , OT-  Patient/family involved in discharge planning: PT- Patient,  OT-Patient, SLP-   MD ELOS: 5-7d Medical Rehab Prognosis:  Excellent Assessment:  61 y.o.right handed femalewith history of CAD, HTN, T2DM, Chronic bradycardia, migraines in the past; who 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 She was admitted on 08/20/16 and reported constant HA radiating from right temporal area to periorbital and right posterior neck as well as photophobia. UDS negative.  CT head reviewed, no acute abnormality. CTA head/neck done revealing tapering of right VA which becomes occluded at C5 concerning for dissection, non opacification of R-ACA and posterior inferior PCA compatible  Posterior fossa ischemia.  MRI/MRA brain done revealing multiple small foci of ischemia in right cerebellar hemisphere within PICA territory, occlusion of visualized portion of R-VA and right PICA. Neurology recommended adding Plavix to ASA for infarcts due to large vessel source and 30 day cardiac event monitoring recommended to rule out A fib due to reports of palpitations at home.   Cardiology consulted due to bradycardia with HR in 30's and felt that worsening of chronic bradycardia likely due to compression of  vagus nerve from vertebral dissection and felt that there was no indications for PPM at this time. Dr. Claiborne Billings recommends long term use of low dose ASA and Plavix   See  Team Conference Notes for weekly updates to the plan of care

## 2016-08-26 NOTE — Progress Notes (Signed)
[facility] PHYSICAL MEDICINE & REHABILITATION     PROGRESS NOTE  Subjective/Complaints:  Modified independent in room  ROS: Denies CP, SOB, N/V/D.  Objective: Vital Signs: Blood pressure 121/60, pulse 62, temperature 97.9 F (36.6 C), temperature source Oral, resp. rate 16, height 5\' 2"  (1.575 m), weight 93.5 kg (206 lb 3.2 oz), SpO2 100 %. No results found.  Recent Labs  08/24/16 0514  WBC 10.7*  HGB 11.7*  HCT 35.8*  PLT 231    Recent Labs  08/24/16 0514  NA 138  K 4.1  CL 103  GLUCOSE 104*  BUN 21*  CREATININE 1.05*  CALCIUM 9.0   CBG (last 3)   Recent Labs  08/25/16 1639 08/25/16 2055 08/26/16 0646  GLUCAP 118* 91 106*    Wt Readings from Last 3 Encounters:  08/23/16 93.5 kg (206 lb 3.2 oz)  05/01/16 90.8 kg (200 lb 3.2 oz)  09/26/15 90.4 kg (199 lb 6.4 oz)    Physical Exam:  BP 121/60 (BP Location: Left Arm)   Pulse 62   Temp 97.9 F (36.6 C) (Oral)   Resp 16   Ht 5\' 2"  (1.575 m)   Wt 93.5 kg (206 lb 3.2 oz)   SpO2 100%   BMI 37.71 kg/m  Constitutional: She appears well-developed and well-nourished.  HENT: Normocephalic and atraumatic.  Eyes: EOMI. No discharge.  Cardiovascular: RRR.  No JVD. Respiratory: Effort normal and breath sounds normal.  GI: Soft. Bowel sounds are normal.  Musculoskeletal: She exhibits no edema or tenderness.  Neurological: She is alert and oriented.  Speech clear.  Follows commands without difficulty.  Motor: 5/5 throughout Skin: Skin is warm and dry. No rash noted. No erythema.  Psychiatric: She has a normal mood and affect. Her behavior is normal.   Assessment/Plan: 1. Functional deficits secondary to right PICA infarct which require 3+ hours per day of interdisciplinary therapy in a comprehensive inpatient rehab setting. Physiatrist is providing close team supervision and 24 hour management of active medical problems listed below. Physiatrist and rehab team continue to assess barriers to discharge/monitor  patient progress toward functional and medical goals.  Function:  Bathing Bathing position   Position: Shower  Bathing parts Body parts bathed by patient: Right arm, Left lower leg, Left arm, Back, Chest, Abdomen, Front perineal area, Buttocks, Right upper leg, Left upper leg, Right lower leg    Bathing assist Assist Level: More than reasonable time      Upper Body Dressing/Undressing Upper body dressing   What is the patient wearing?: Pull over shirt/dress     Pull over shirt/dress - Perfomed by patient: Thread/unthread right sleeve, Thread/unthread left sleeve, Put head through opening          Upper body assist Assist Level: No help, No cues      Lower Body Dressing/Undressing Lower body dressing   What is the patient wearing?: Underwear, Pants, Socks Underwear - Performed by patient: Thread/unthread right underwear leg, Thread/unthread left underwear leg, Pull underwear up/down   Pants- Performed by patient: Thread/unthread right pants leg, Thread/unthread left pants leg, Pull pants up/down       Socks - Performed by patient: Don/doff right sock, Don/doff left sock   Shoes - Performed by patient: Don/doff right shoe, Don/doff left shoe            Lower body assist Assist for lower body dressing: No Help, No cues      Toileting Toileting   Toileting steps completed by patient: Adjust clothing  prior to toileting, Performs perineal hygiene, Adjust clothing after toileting   Toileting Assistive Devices: Grab bar or rail  Toileting assist Assist level: No help/no cues   Transfers Chair/bed transfer     Chair/bed transfer assist level: No help, no cues       Locomotion Ambulation     Max distance: 200 Assist level: Supervision or verbal cues   Wheelchair          Cognition Comprehension Comprehension assist level: Follows complex conversation/direction with no assist  Expression Expression assist level: Expresses complex ideas: With no assist   Social Interaction Social Interaction assist level: Interacts appropriately with others - No medications needed.  Problem Solving Problem solving assist level: Solves complex problems: Recognizes & self-corrects  Memory Memory assist level: Complete Independence: No helper    Medical Problem List and Plan: 1.  Gait abnormality, balance deficits secondary to right PICA infarct on 08/20/16.             Cont ASA and Plavix  Cont CIR, Stable for discharge today after therapy 2.  DVT Prophylaxis/Anticoagulation: Pharmaceutical: Lovenox 3. Pain Management: PRN meds 4. Mood: Is pretty upbeat. LCSW to follow for evaluation and support.  5. Neuropsych: This patient is capable of making decisions on her own behalf. 6. Skin/Wound Care: routine pressure relief measures.  7. Fluids/Electrolytes/Nutrition: Monitor I/Os. Intake has improved.  8. Migraines: Headaches resolved.  9. HTN: Monitor BP bid  Off Hyzaar  Monitor with increased mobility  Controlled 3/4 10 CAD with chronic bradycardia: to continue ASA/Plavix indefinitely.  11. T2DM: controlled with diet--Hgb A1c stable at 5.8. Will monitor BS ac/hs for now.   Monitor with increased mobility  Controlled 3/4 12. Constipation: Will start bowel regimen  13. Morbid obesity             Body mass index is 37.71 kg/m.  Diet and exercise education  Encourage weight loss to increase endurance and promote overall health 14. Leukocytosis-minimal  Afebrile  WBCs 10.7 on 3/3  Cont to follow 15. Acute blood loss anemia  Hb 11.7 on 3/3  Cont to monitor 16. AKI  Resolved, follow-up creatinine normal  Cont to monitor   LOS (Days) 3 A FACE TO FACE EVALUATION WAS PERFORMED  KIRSTEINS,ANDREW E 08/26/2016 8:45 AM

## 2016-08-26 NOTE — Care Management Note (Signed)
[facility] Individual Statement of Services  Patient Name:  Elaine Jackson  Date:  08/26/2016  Welcome to the [facility].  Our goal is to provide you with an individualized program based on your diagnosis and situation, designed to meet your specific needs.  With this comprehensive rehabilitation program, you will be expected to participate in at least 3 hours of rehabilitation therapies Monday-Friday, with modified therapy programming on the weekends.  Your rehabilitation program will include the following services:  Physical Therapy (PT), Occupational Therapy (OT), 24 hour per day rehabilitation nursing, Case Management (Social Worker), Rehabilitation Medicine, Nutrition Services and Pharmacy Services  Weekly team conferences will be held on Wednesday to discuss your progress.  Your Social Worker will talk with you frequently to get your input and to update you on team discussions.  Team conferences with you and your family in attendance may also be held.  Expected length of stay: 3 days  Overall anticipated outcome: independent level  Depending on your progress and recovery, your program may change. Your Social Worker will coordinate services and will keep you informed of any changes. Your Social Worker's name and contact numbers are listed  below.  The following services may also be recommended but are not provided by the [facility] will be made to provide these services after discharge if needed.  Arrangements include referral to agencies that provide these services.  Your insurance has been verified to be:  [employer] Your primary doctor is:  Reginia Forts  Pertinent information will be shared with your doctor and your insurance company.  Social Worker:  Ovidio Kin, [place] or ([phone]  Information discussed with and copy given to patient by: [name], 08/26/2016, 8:33 AM

## 2016-08-26 NOTE — Progress Notes (Signed)
Social Work  Discharge Note  The overall goal for the admission was met for:   Discharge location: [facility] TO ASSIST  Length of Stay: Yes-3 DAYS  Discharge activity level: Yes-INDEPENDENT LEVEL  Home/community participation: Yes  Services provided included: MD, RD, PT, OT, RN, CM, Pharmacy and SW  Financial Services: Private Insurance: [employer] [place]  Follow-up services arranged: Other: NO FOLLOW UP RECOMMENDED  Comments (or additional information):PT AT INDEPENDENT LEVEL AND READY TO [facility] [place]. NO EQUIPMENT OR FOLLOW UP RECOMMENDED  Patient/Family verbalized understanding of follow-up arrangements: Yes  Individual responsible for coordination of the follow-up plan: SELF & GORDON-HUSBAND  Confirmed correct DME delivered: [name] 08/26/2016    [name]

## 2016-08-26 NOTE — Progress Notes (Signed)
Physical Therapy Discharge Summary  Patient Details  Name: Elaine Jackson MRN: [id number] Date of Birth: [birth year]  Today's Date: 08/26/2016 PT Individual Time: 2440-1027 PT Individual Time Calculation (min): 46 min   Patient has met 7 of 7 long term goals due to improved balance, improved postural control and increased strength.  Patient to discharge at an ambulatory level Modified Independent.   Patient's care partner is not needed to provide assistance at d/c except for driving.  Reasons goals not met: n/a  Recommendation:  Patient will benefit from ongoing skilled PT services in none recommended .  Equipment: No equipment provided  Reasons for discharge: treatment goals met and discharge from hospital  Patient/family agrees with progress made and goals achieved: Yes  PT Discharge  Function tools assessed.  PT advised pt to carry cell phone with her at all times.  PT discussed falls recovery and pt performed floor transfer safely without cues.  Gait throughout unit, using stairs, elevator, opening doors, using signs and maps to find her room at typical speed with good judgment.  Fitness for life discussed with pt; she had previously been attending a class at her church.  Agility/balance activities including floor ladder R><L accurately and on floor mat, 4 pt> 2 pt.  Pt needed cues for full R hip extension and for avoiding pelvic rotation with L shoulder flexion/R hip extension 2 pt position.pt left resting in her room, sitting EOB.   Precautions/Restrictions Precautions Precautions: None Restrictions Weight Bearing Restrictions: No   Pain Pain Assessment Pain Assessment: No/denies pain Pain Score: 0-No pain  With RUE wt bearing in 2 pt position on floor, pt had discomfort and limited her time in this position.  She stated she feels she may have hurt her shoulder helping her mother before mother's recent death. Pt will pursue an orthopedic eval PRN. Vision/Perception - baseline,  rarely wears reading glasses    Cognition Overall Cognitive Status: Within Functional Limits for tasks assessed Arousal/Alertness: Awake/alert Orientation Level: Oriented X4 Memory: Appears intact Safety/Judgment: Appears intact Sensation Sensation Light Touch: Appears Intact Stereognosis: Appears Intact Hot/Cold: Appears Intact Proprioception: Appears Intact Coordination Gross Motor Movements are Fluid and Coordinated: Yes Fine Motor Movements are Fluid and Coordinated: Yes Motor  Motor Motor: Within Functional Limits  Mobility Bed Mobility Rolling Right: 6: Modified independent (Device/Increase time) Rolling Left: 6: Modified independent (Device/Increase time) Sit to Supine: 6: Modified independent (Device/Increase time) Transfers Transfers: Yes Sit to Stand: 6: Modified independent (Device/Increase time) Locomotion  Ambulation Ambulation: Yes Ambulation/Gait Assistance: 6: Modified independent (Device/Increase time) Ambulation Distance (Feet): 500 Feet Assistive device: None Gait Gait: Yes Gait Pattern: Within Functional Limits Gait velocity: 10 MWT 4.69'/sec High Level Ambulation High Level Ambulation: Side stepping;Backwards walking Side Stepping: mod I  Backwards Walking: mod I Stairs / Additional Locomotion Stairs: Yes Stairs Assistance: 6: Modified independent (Device/Increase time) Stair Management Technique: No rails Number of Stairs: 12 Height of Stairs: 6 Ramp: 7: Independent Curb: 7: Independent Wheelchair Mobility Wheelchair Mobility: No  Trunk/Postural Assessment  Cervical Assessment Cervical Assessment: Within Functional Limits Thoracic Assessment Thoracic Assessment: Within Functional Limits Lumbar Assessment Lumbar Assessment: Within Functional Limits Postural Control Postural Control: Within Functional Limits  Balance Balance Balance Assessed: Yes Standardized Balance Assessment Standardized Balance Assessment: Berg Balance  Test;Dynamic Gait Index Merrilee Jansky and DGI 08/24/16) Berg Balance Test Sit to Stand: Able to stand  independently using hands Standing Unsupported: Able to stand safely 2 minutes Sitting with Back Unsupported but Feet Supported on Floor or Stool: Able  to sit safely and securely 2 minutes Stand to Sit: Sits safely with minimal use of hands Transfers: Able to transfer safely, minor use of hands Standing Unsupported with Eyes Closed: Able to stand 10 seconds with supervision Standing Ubsupported with Feet Together: Able to place feet together independently and stand for 1 minute with supervision From Standing, Reach Forward with Outstretched Arm: Can reach confidently >25 cm (10") From Standing Position, Pick up Object from Floor: Able to pick up shoe safely and easily From Standing Position, Turn to Look Behind Over each Shoulder: Looks behind from both sides and weight shifts well Turn 360 Degrees: Able to turn 360 degrees safely in 4 seconds or less Standing Unsupported, Alternately Place Feet on Step/Stool: Able to stand independently and safely and complete 8 steps in 20 seconds Standing Unsupported, One Foot in Front: Able to plae foot ahead of the other independently and hold 30 seconds Standing on One Leg: Able to lift leg independently and hold 5-10 seconds Total Score: 51 Dynamic Gait Index Level Surface: Mild Impairment Change in Gait Speed: Mild Impairment Gait with Horizontal Head Turns: Mild Impairment Gait with Vertical Head Turns: Mild Impairment Gait and Pivot Turn: Mild Impairment Step Over Obstacle: Mild Impairment Step Around Obstacles: Mild Impairment Steps: Mild Impairment Total Score: 16 Dynamic Sitting Balance Sitting balance - Comments: independent sitting and standing Extremity Assessment      RLE Assessment RLE Assessment: Within Functional Limits RLE Strength Right Knee Extension: 4+/5 LLE Assessment LLE Assessment: Within Functional Limits   See Function  Navigator for Current Functional Status.  Taras Rask 08/26/2016, 12:39 PM

## 2016-08-26 NOTE — Progress Notes (Deleted)
Physical Therapy Note  Patient Details  Name: Elaine Jackson MRN: [id number] Date of Birth: 1956/01/23 Today's Date: 08/26/2016  [id number], 45 min individual tx Pain:       [name] 08/26/2016, 7:57 AM

## 2016-08-28 ENCOUNTER — Encounter: Payer: Self-pay | Admitting: Family Medicine

## 2016-08-28 ENCOUNTER — Ambulatory Visit (INDEPENDENT_AMBULATORY_CARE_PROVIDER_SITE_OTHER): Payer: BLUE CROSS/BLUE SHIELD | Admitting: Family Medicine

## 2016-08-28 VITALS — BP 125/75 | HR 67 | Temp 97.9°F | Resp 18 | Ht 62.0 in | Wt 204.0 lb

## 2016-08-28 DIAGNOSIS — N179 Acute kidney failure, unspecified: Secondary | ICD-10-CM | POA: Diagnosis not present

## 2016-08-28 DIAGNOSIS — E041 Nontoxic single thyroid nodule: Secondary | ICD-10-CM

## 2016-08-28 DIAGNOSIS — L309 Dermatitis, unspecified: Secondary | ICD-10-CM | POA: Diagnosis not present

## 2016-08-28 DIAGNOSIS — I639 Cerebral infarction, unspecified: Secondary | ICD-10-CM

## 2016-08-28 DIAGNOSIS — E118 Type 2 diabetes mellitus with unspecified complications: Secondary | ICD-10-CM

## 2016-08-28 DIAGNOSIS — G450 Vertebro-basilar artery syndrome: Secondary | ICD-10-CM | POA: Diagnosis not present

## 2016-08-28 DIAGNOSIS — Z9884 Bariatric surgery status: Secondary | ICD-10-CM

## 2016-08-28 DIAGNOSIS — I7774 Dissection of vertebral artery: Secondary | ICD-10-CM | POA: Diagnosis not present

## 2016-08-28 DIAGNOSIS — I2510 Atherosclerotic heart disease of native coronary artery without angina pectoris: Secondary | ICD-10-CM | POA: Diagnosis not present

## 2016-08-28 DIAGNOSIS — I252 Old myocardial infarction: Secondary | ICD-10-CM

## 2016-08-28 DIAGNOSIS — I10 Essential (primary) hypertension: Secondary | ICD-10-CM | POA: Diagnosis not present

## 2016-08-28 MED ORDER — TRIAMCINOLONE ACETONIDE 0.1 % EX CREA: 1.0000 "application " | TOPICAL_CREAM | Freq: Two times a day (BID) | CUTANEOUS | 0 refills | Status: DC

## 2016-08-28 NOTE — Patient Instructions (Signed)
     IF you received an x-ray today, you will receive an invoice from [facility]. Please contact [facility] at [phone] with questions or concerns regarding your invoice.   IF you received labwork today, you will receive an invoice from [facility]. Please contact [facility] at [phone] with questions or concerns regarding your invoice.   Our billing staff will not be able to assist you with questions regarding bills from these companies.  You will be contacted with the lab results as soon as they are available. The fastest way to get your results is to activate your My Chart account. Instructions are located on the last page of this paperwork. If you have not heard from us regarding the results in 2 weeks, please contact this office.     

## 2016-08-28 NOTE — Progress Notes (Signed)
Subjective:    Patient ID: Elaine Jackson, female    DOB: 08-07-1955, 61 y.o.   MRN: [id number]  08/28/2016  Follow-up (hospital had a stroke )   HPI This 61 y.o. female presents for [place] for acute CVA. Here are the details of the discharge summary:  Admit date: 08/23/2016 Discharge date: 08/26/2016  Discharge Diagnoses:  Principal Problem:   Cerebellar stroke [facility]) Active Problems:   AKI (acute kidney injury) ([place])   Acute blood loss anemia   Leukocytosis   Morbid obesity ([place])   Discharged Condition: stable   Significant Diagnostic Studies: N/A   Labs:  Basic Metabolic Panel: BMP Latest Ref Rng & Units 08/24/2016 08/20/2016 08/20/2016  Glucose 65 - 99 mg/dL 104(H) 181(H) 177(H)  BUN 6 - 20 mg/dL 21(H) 35(H) 26(H)  Creatinine 0.44 - 1.00 mg/dL 1.05(H) 1.20(H) 1.13(H)  Sodium 135 - 145 mmol/L 138 139 137  Potassium 3.5 - 5.1 mmol/L 4.1 4.4 4.4  Chloride 101 - 111 mmol/L 103 102 102  CO2 22 - 32 mmol/L 28 - 22  Calcium 8.9 - 10.3 mg/dL 9.0 - 9.8    CBC:  Recent Labs Lab 08/20/16 1127 08/20/16 1155 08/24/16 0514  WBC 9.0  --  10.7*  NEUTROABS 5.1  --  7.0  HGB 12.2 12.6 11.7*  HCT 37.8 37.0 35.8*  MCV 93.6  --  94.0  PLT 243  --  231    CBG:  LastLabs   Recent Labs Lab 08/25/16 1127 08/25/16 1639 08/25/16 2055 08/26/16 0646 08/26/16 1117  GLUCAP 93 118* 91 106* 100*      Brief HPI:   Elaine Jackson a 61 y.o.right handed femalewith history of CAD, HTN, T2DM, Chronic bradycardia, migraines in the past; who 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 She was admitted on 08/20/16 and reported constant HA radiating from right temporal area to periorbital and right posterior neck as well as photophobia.  CTA head/neck done revealing tapering of right VA which becomes occluded at C5 concerning for dissection, non opacification of R-ACA and posterior inferior PCA compatible Posterior fossa ischemia. MRI/MRA  brain done revealing multiple small foci of ischemia in right cerebellar hemisphere within PICA territory, occlusion of visualized portion of R-VA and right PICA. Neurology recommended adding Plavix to ASA for infarcts due to large vessel source.   Cardiology consulted due to bradycardia and felt that worsening of chronic bradycardia likely due to compression of vagus nerve from vertebral dissection and felt that there was no indications for PPM at this time. Dr. Claiborne Billings recommends long term use of low dose ASA and Plavix. 2 D echo revealed EF 40-98%, grade 1 diastolic dysfunction and no wall abnormality. Loop recorder to be placed today. Therapy ongoing and and patient with staggering gait and poor safety awareness. CIR recommended for follow up therapy.    Hospital Course: Elaine Jackson was admitted to rehab 08/23/2016 for inpatient therapies to consist of PT, ST and OT at least three hours five days a week. Past admission physiatrist, therapy team and rehab RN have worked together to provide customized collaborative inpatient rehab. Blood pressures were monitored bid and have been controlled off medications. Heart rate has ranged from 59-64 bpm and no reports of dizziness or presyncope with activity.  Po intake has been good and she is continent of bowel an bladder. Follow up check of lytes shows improvement in renal status. CBC revealed mild ABLA and leucocytosis but no signs  of infection noted.  Blood sugars have been controlled on diet alone. She has made great progress and is independent at discharge. No follow up therapy needed.    Rehab course: At admission, patient required supervision with mobility and basic self care tasks. Brief team conference held due to short length of stay and great progress. She has demonstrated improved activity tolerance, improved balance and ability to compensate for deficits.  She is modified independent for ADL tasks and is ambulating 500' without AD. Berg balance  score is 51/56 and she was educated on fall recovery as well as transitioning to prior level of activity. No family education needed due to her progress.    Disposition: 01-Home or Self Care  Diet: Heart Healthy.  Carb Modified.   Special Instructions: 1. No driving. No strenuous activity. Do not return to work till cleared by MD. 2. Needs BMET/CBC rechecked on post hospital follow up.        Discharge Instructions    Ambulatory referral to Physical Medicine Rehab    Complete by:  As directed    1-2 weeks transitional care appt         Allergies as of 08/26/2016      Reactions   Sulfa Antibiotics Nausea And Vomiting   Zofran [ondansetron Hcl] Other (See Comments)   Oral numbness with IV dosing.          Medication List    STOP taking these medications   losartan-hydrochlorothiazide 100-25 MG tablet Commonly known as:  HYZAAR   nitroGLYCERIN 0.4 MG SL tablet Commonly known as:  NITROSTAT     TAKE these medications   aspirin 325 MG EC tablet Take 1 tablet (325 mg total) by mouth daily. Start taking on:  08/27/2016 What changed:  medication strength  how much to take   atorvastatin 80 MG tablet Commonly known as:  LIPITOR TAKE 1 TABLET (80 MG TOTAL) BY MOUTH DAILY AT 6 PM. What changed:  how much to take  how to take this  when to take this  additional instructions   cholecalciferol 1000 units tablet Commonly known as:  VITAMIN D Take 1 tablet (1,000 Units total) by mouth daily.   clopidogrel 75 MG tablet Commonly known as:  PLAVIX Take 1 tablet (75 mg total) by mouth daily. Start taking on:  08/27/2016   fluticasone 50 MCG/ACT nasal spray Commonly known as:  FLONASE Place 2 sprays into both nostrils daily as needed for allergies.   montelukast 10 MG tablet Commonly known as:  SINGULAIR TAKE 1 TABLET (10 MG TOTAL) BY MOUTH AT BEDTIME. What changed:  how much to take  how to take this  when to take this  additional  instructions   polyethylene glycol packet Commonly known as:  MIRALAX / GLYCOLAX Take 17 g by mouth daily. Start taking on:  08/27/2016   VITAMIN B-12 PO Take 1 tablet by mouth daily.         Follow-up Information    Denzal Meir, MD Follow up on 08/28/2016.   Specialty:  Family Medicine Why:  Appointment @ 2:30 PM Contact information: [place] [address] [phone]        Charlett Blake, MD Follow up.   Specialty:  Physical Medicine and Rehabilitation Why:  office will call you with follow up appointment  Contact information: [place] [address] [phone]        Xu,Jindong, MD. Call.   Specialty:  Neurology Why:  for follow  up appointment in 4-6 weeks Contact information: [address] [place] [address] [phone]          Stopped Plavix in 01/2016 after AMI in 2016.  Placed LOOP monitor during recent admission.   Appointment next Wednesday with cardiology which was standing; will be due to turn in 10 days. [place]. PA Servando Snare needs to see pt; received call this week.   On 09/16/16 scheduled to see D. Kirsteins; unable to drive.   Worked today for a little while.  Felt fine.  Yesterday, restless; ready to return to work. Cleaned all day yesterday.  Walked for 30 minutes yesterday.   Had horrible R maxillary sinus pain and R posterior neck pain.  Horribly dizzy and nauseated; vomited excessively; prescribed Zofran.   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  Started vomiting in middle of hair salon.  Called 911.  Transported to ED.  Blood sugar was normal; BP normal.   Transported by EMT.   Listening to body.  Plans to walk for thirty minutes.  Blood pressure this morning 123/75.  Pulse 54.    Immunization History  Administered Date(s) Administered  . Hepatitis A 08/13/2010, 03/20/2011  . Hepatitis B 06/24/2010,  08/13/2010, 12/03/2010, 03/20/2011  . Influenza Split 03/20/2011, 03/02/2012  . Influenza,inj,Quad PF,36+ Mos 04/28/2013, 03/14/2014, 05/01/2016  . Influenza-Unspecified 04/19/2015  . Pneumococcal Polysaccharide-23 06/06/2014  . Pneumococcal-Unspecified 07/18/2008  . Td 11/22/2001  . Tdap 09/02/2011  . Zoster 06/16/2015   BP Readings from Last 3 Encounters:  09/16/16 (!) 155/85  09/02/16 108/70  08/28/16 125/75   Wt Readings from Last 3 Encounters:  09/02/16 205 lb 12.8 oz (93.4 kg)  08/28/16 204 lb (92.5 kg)  08/23/16 206 lb 3.2 oz (93.5 kg)     Review of Systems  Constitutional: Negative for chills, diaphoresis, fatigue and fever.  Eyes: Negative for visual disturbance.  Respiratory: Negative for cough and shortness of breath.   Cardiovascular: Negative for chest pain, palpitations and leg swelling.  Gastrointestinal: Negative for abdominal pain, constipation, diarrhea, nausea and vomiting.  Endocrine: Negative for cold intolerance, heat intolerance, polydipsia, polyphagia and polyuria.  Neurological: Negative for dizziness, tremors, seizures, syncope, facial asymmetry, speech difficulty, weakness, light-headedness, numbness and headaches.    Past Medical History:  Diagnosis Date  . Allergic rhinitis, cause unspecified   . Allergy   . CAD (coronary artery disease)    LHC 12/20/2014 showed small to moderate apical infarction due to distal LAD stenosis with reestablished TIMI2 flow. No PCI. Recommended plavix for 1 yr and ASA for life.   . Chicken pox   . Diabetes mellitus   . Family history of breast cancer in first degree relative    sister age 24  . Heart murmur   . Hyperlipidemia   . Hypertension   . Malignant neoplasm skin of face 02/16/2007   Basal Cell Carcinoma  [place].  . Measles   . Obesity, unspecified   . Other chronic nonalcoholic liver disease   . Sleep related leg cramps   . Unspecified vitamin D deficiency    Past Surgical History:  Procedure  Laterality Date  . CARDIAC CATHETERIZATION N/A 12/20/2014   Procedure: Left Heart Cath and Coronary Angiography;  Surgeon: Sanda Klein, MD;  Location: [place] [facility];  Service: Cardiovascular;  Laterality: N/A;  . CHOLECYSTECTOMY    . COLONOSCOPY  06/25/2007   [place]. Normal. Repeat in ten years.  Marland Kitchen LAPAROSCOPIC GASTRIC BANDING  07/03/10   Dr. Sherrin Daisy; [facility]  . LOOP RECORDER INSERTION N/A 08/23/2016   Procedure: Loop Recorder Insertion;  Surgeon: Deboraha Sprang, MD;  Location: [place] [facility];  Service: Cardiovascular;  Laterality: N/A;  . TONSILLECTOMY     Allergies  Allergen Reactions  . Sulfa Antibiotics Nausea And Vomiting  . Zofran [Ondansetron Hcl] Other (See Comments)    Oral numbness with IV dosing.    Current Outpatient Prescriptions  Medication Sig Dispense Refill  . aspirin EC 325 MG EC tablet Take 1 tablet (325 mg total) by mouth daily. 30 tablet 0  . atorvastatin (LIPITOR) 80 MG tablet TAKE 1 TABLET (80 MG TOTAL) BY MOUTH DAILY AT 6 PM. 30 tablet 0  . cholecalciferol (VITAMIN D) 1000 units tablet Take 1 tablet (1,000 Units total) by mouth daily.    . clopidogrel (PLAVIX) 75 MG tablet Take 1 tablet (75 mg total) by mouth daily. 30 tablet 0  . Cyanocobalamin (VITAMIN B-12 PO) Take 1 tablet by mouth daily.    . fluticasone (FLONASE) 50 MCG/ACT nasal spray Place 2 sprays into both nostrils daily as needed for allergies.   11  . montelukast (SINGULAIR) 10 MG tablet TAKE 1 TABLET (10 MG TOTAL) BY MOUTH AT BEDTIME. 90 tablet 3  . triamcinolone cream (KENALOG) 0.1 % Apply 1 application topically 2 (two) times daily. 45 g 0   No current facility-administered medications for this visit.    Social History   Social History  . Marital status: Married    Spouse name: N/A  . Number of children: 1  . Years of education: N/A   Occupational History  . 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 Smoking status: Former Smoker    Packs/day: 1.00     Years: 25.00    Types: Cigarettes  . Smokeless tobacco: Never Used     Comment: quit age 51     smoked from 89 to 31 off and on  . Alcohol use No  . Drug use: No  . Sexual activity: Yes    Birth control/ protection: Post-menopausal   Other Topics Concern  . Not on file   Social History Narrative   Marital status:  Married; x 26 years. Second marriage. Happily married; no abuse      Children: one child, one stepson;two grandsons      Lives: with husband, mother in 2017      Employment:  [employer] part-time 25-30 hours per week; current job x 19 years.      Tobacco:  None      Alcohol: rare/minimal.      Drugs:  None       Exercise: none in 2017       Seatbelt:  100%.       Sunscreen: SPF 15.      Guns:  Maybe?   Family History  Problem Relation Age of Onset  . Diabetes Mother   . Heart disease Mother     defibrillator; carotid artery stenosis.  . Hypertension Mother   . Hyperlipidemia Mother   . Dementia Mother   . Congestive Heart Failure Mother   . Heart disease Father   . Kidney failure Father     ESRD/peritoneal dialysis  . Diabetes Father   . Hypertension Father   . Hyperlipidemia Father   . Stroke Father   . Kidney disease Father   . Congestive Heart Failure Father   . Diabetes Sister   .  Hypertension Sister   . Kidney disease Sister     renal failure  . Diabetes Brother   . Hypertension Brother   . Hyperlipidemia Brother   . Arthritis Brother   . Cervical cancer Sister   . Hypertension Sister   . Liver cancer Sister 33  . Stroke Maternal Grandmother   . Heart disease Maternal Grandfather   . Stroke Paternal Grandmother   . Heart disease Paternal Grandmother   . Heart disease Paternal Grandfather   . Stroke Paternal Grandfather   . Breast cancer Sister 9  . Hepatitis C Sister 56       Objective:    BP 125/75 (BP Location: Right Arm, Patient Position: Sitting, Cuff Size: Small)   Pulse 67   Temp 97.9 F (36.6 C) (Oral)   Resp 18   Ht 5'  2" (1.575 m)   Wt 204 lb (92.5 kg)   SpO2 97%   BMI 37.31 kg/m  Physical Exam  Constitutional: She is oriented to person, place, and time. She appears well-developed and well-nourished. No distress.  HENT:  Head: Normocephalic and atraumatic.  Right Ear: External ear normal.  Left Ear: External ear normal.  Nose: Nose normal.  Mouth/Throat: Oropharynx is clear and moist.  Eyes: Conjunctivae and EOM are normal. Pupils are equal, round, and reactive to light.  Neck: Normal range of motion. Neck supple. Carotid bruit is not present. No thyromegaly present.  Cardiovascular: Normal rate, regular rhythm, normal heart sounds and intact distal pulses.  Exam reveals no gallop and no friction rub.   No murmur heard. Pulmonary/Chest: Effort normal and breath sounds normal. She has no wheezes. She has no rales.  Abdominal: Soft. Bowel sounds are normal. She exhibits no distension and no mass. There is no tenderness. There is no rebound and no guarding.  Lymphadenopathy:    She has no cervical adenopathy.  Neurological: She is alert and oriented to person, place, and time. She has normal strength. No cranial nerve deficit. She exhibits normal muscle tone. She displays a negative Romberg sign. Coordination and gait normal.  Skin: Skin is warm and dry. No rash noted. She is not diaphoretic. No erythema. No pallor.  Psychiatric: She has a normal mood and affect. Her behavior is normal.        Assessment & Plan:   1. Acute CVA (cerebrovascular accident) ([place])   2. Vertebral artery dissection ([place])   3. Vertebrobasilar occlusive disease   4. Essential hypertension, benign   5. Old MI (myocardial infarction)   6. Coronary artery disease involving native coronary artery of native heart without angina pectoris   7. Left thyroid nodule   8. Diabetes mellitus with complication ([place])   9. Acute kidney injury ([place])   10. Hx of laparoscopic gastric banding   11. Dermatitis    -hospital records  reviewed in detail; neurologically doing very well and can ambulate independently.  LOOP monitor in place.  -keep follow-up with cardiology next week; significant concern regarding undiagnosed atrial fibrillation as history of previous CVA and recent CVA. -repeat labs. -obtain thyroid US due to thyroid nodule detected during carotid dopplers. -continue Plavix therapy with ASA until further determined. -prolonged face to face for 45 minutes with greater than 50% of time dedicated to counseling,coordination of care, and review of medical records.   Orders Placed This Encounter  Procedures  . US THYROID    EPIC ORDER [id number]/no needs Ins-bcbs amh,pt    Standing Status:   Future  Standing Expiration Date:   10/28/2017    Order Specific Question:   Reason for Exam (SYMPTOM  OR DIAGNOSIS REQUIRED)    Answer:   L thyroid nodule 2 cm on CT angio with recent CVA    Order Specific Question:   Preferred imaging location?    Answer:   [facility]  . CBC with Differential/Platelet  . Comprehensive metabolic panel   Meds ordered this encounter  Medications  . triamcinolone cream (KENALOG) 0.1 %    Sig: Apply 1 application topically 2 (two) times daily.    Dispense:  45 g    Refill:  0    No Follow-up on file.   Falcon Mccaskey Elayne Guerin, M.D. Primary Care at [facility] previously [facility] [address] [place], [place]  [address] [phone] phone [phone] fax

## 2016-08-29 LAB — CBC WITH DIFFERENTIAL/PLATELET
BASOS: 0 %
Basophils Absolute: 0 10*3/uL (ref 0.0–0.2)
EOS (ABSOLUTE): 0.1 10*3/uL (ref 0.0–0.4)
EOS: 2 %
HEMATOCRIT: 34.5 % (ref 34.0–46.6)
HEMOGLOBIN: 11.5 g/dL (ref 11.1–15.9)
Immature Grans (Abs): 0 10*3/uL (ref 0.0–0.1)
Immature Granulocytes: 0 %
LYMPHS ABS: 2.1 10*3/uL (ref 0.7–3.1)
Lymphs: 25 %
MCH: 30.7 pg (ref 26.6–33.0)
MCHC: 33.3 g/dL (ref 31.5–35.7)
MCV: 92 fL (ref 79–97)
MONOCYTES: 5 %
Monocytes Absolute: 0.4 10*3/uL (ref 0.1–0.9)
NEUTROS ABS: 5.7 10*3/uL (ref 1.4–7.0)
Neutrophils: 68 %
Platelets: 298 10*3/uL (ref 150–379)
RBC: 3.75 x10E6/uL — AB (ref 3.77–5.28)
RDW: 13.6 % (ref 12.3–15.4)
WBC: 8.4 10*3/uL (ref 3.4–10.8)

## 2016-08-29 LAB — COMPREHENSIVE METABOLIC PANEL
ALBUMIN: 4.4 g/dL (ref 3.6–4.8)
ALK PHOS: 62 IU/L (ref 39–117)
ALT: 28 IU/L (ref 0–32)
AST: 23 IU/L (ref 0–40)
Albumin/Globulin Ratio: 1.7 (ref 1.2–2.2)
BUN/Creatinine Ratio: 28 (ref 12–28)
BUN: 25 mg/dL (ref 8–27)
Bilirubin Total: 0.5 mg/dL (ref 0.0–1.2)
CO2: 21 mmol/L (ref 18–29)
CREATININE: 0.89 mg/dL (ref 0.57–1.00)
Calcium: 9.7 mg/dL (ref 8.7–10.3)
Chloride: 103 mmol/L (ref 96–106)
GFR calc Af Amer: 81 mL/min/{1.73_m2} (ref >59)
GFR calc non Af Amer: 70 mL/min/{1.73_m2} (ref >59)
GLOBULIN, TOTAL: 2.6 g/dL (ref 1.5–4.5)
Glucose: 98 mg/dL (ref 65–99)
Potassium: 4.8 mmol/L (ref 3.5–5.2)
SODIUM: 142 mmol/L (ref 134–144)
Total Protein: 7 g/dL (ref 6.0–8.5)

## 2016-08-30 ENCOUNTER — Encounter: Payer: Self-pay | Admitting: Cardiology

## 2016-09-02 ENCOUNTER — Telehealth: Payer: Self-pay | Admitting: Physical Medicine & Rehabilitation

## 2016-09-02 ENCOUNTER — Ambulatory Visit (INDEPENDENT_AMBULATORY_CARE_PROVIDER_SITE_OTHER): Payer: BLUE CROSS/BLUE SHIELD | Admitting: *Deleted

## 2016-09-02 ENCOUNTER — Encounter: Payer: Self-pay | Admitting: Cardiology

## 2016-09-02 ENCOUNTER — Ambulatory Visit (INDEPENDENT_AMBULATORY_CARE_PROVIDER_SITE_OTHER): Payer: BLUE CROSS/BLUE SHIELD | Admitting: Cardiology

## 2016-09-02 VITALS — BP 108/70 | HR 57 | Ht 62.5 in | Wt 205.8 lb

## 2016-09-02 DIAGNOSIS — I2510 Atherosclerotic heart disease of native coronary artery without angina pectoris: Secondary | ICD-10-CM

## 2016-09-02 DIAGNOSIS — R002 Palpitations: Secondary | ICD-10-CM | POA: Diagnosis not present

## 2016-09-02 DIAGNOSIS — I639 Cerebral infarction, unspecified: Secondary | ICD-10-CM

## 2016-09-02 DIAGNOSIS — I252 Old myocardial infarction: Secondary | ICD-10-CM | POA: Diagnosis not present

## 2016-09-02 LAB — CUP PACEART INCLINIC DEVICE CHECK
Implantable Pulse Generator Implant Date: 20180302
MDC IDC SESS DTM: 20180312084729

## 2016-09-02 NOTE — Progress Notes (Signed)
Cardiology Office Note    Date:  09/02/2016   ID:  Elaine Jackson, DOB August 06, 1955, MRN [id number]  PCP:  Reginia Forts, MD  Cardiologist:   Candee Furbish, MD   Chief Complaint  Patient presents with  . Follow-up    History of Present Illness:  Elaine Jackson is a 61 y.o. female with apical myocardial infarction on 12/20/14 due to distal LAD stenosis, no PCI with diabetes, hyperlipidemia, hypertension who had stroke February/18 with loop recorder implanted here for follow-up. Overall she is feeling much better, symptoms have resolved. She spent a short time in rehabilitation. Her symptoms felt like her right side was heavy like concrete board over it and she had warning symptoms on the Sunday before the stroke.  Chest pain, no shortness of breath, no syncope, no orthopnea. Loop recorder is in place. Functioning well. So far, no evidence of atrial fibrillation.    Past Medical History:  Diagnosis Date  . Allergic rhinitis, cause unspecified   . Allergy   . CAD (coronary artery disease)    LHC 12/20/2014 showed small to moderate apical infarction due to distal LAD stenosis with reestablished TIMI2 flow. No PCI. Recommended plavix for 1 yr and ASA for life.   . Chicken pox   . Diabetes mellitus   . Family history of breast cancer in first degree relative    sister age 62  . Heart murmur   . Hyperlipidemia   . Hypertension   . Malignant neoplasm skin of face 02/16/2007   Basal Cell Carcinoma  [place].  . Measles   . Obesity, unspecified   . Other chronic nonalcoholic liver disease   . Sleep related leg cramps   . Unspecified vitamin D deficiency     Past Surgical History:  Procedure Laterality Date  . CARDIAC CATHETERIZATION N/A 12/20/2014   Procedure: Left Heart Cath and Coronary Angiography;  Surgeon: Sanda Klein, MD;  Location: [facility];  Service: Cardiovascular;  Laterality: N/A;  . CHOLECYSTECTOMY    . COLONOSCOPY  06/25/2007   [place]. Normal. Repeat in ten years.    Marland Kitchen LAPAROSCOPIC GASTRIC BANDING  07/03/10   Dr. Sherrin Daisy; [facility]  . LOOP RECORDER INSERTION N/A 08/23/2016   Procedure: Loop Recorder Insertion;  Surgeon: Deboraha Sprang, MD;  Location: [facility];  Service: Cardiovascular;  Laterality: N/A;  . TONSILLECTOMY      Current Medications: Outpatient Medications Prior to Visit  Medication Sig Dispense Refill  . aspirin EC 325 MG EC tablet Take 1 tablet (325 mg total) by mouth daily. 30 tablet 0  . atorvastatin (LIPITOR) 80 MG tablet TAKE 1 TABLET (80 MG TOTAL) BY MOUTH DAILY AT 6 PM. 30 tablet 0  . cholecalciferol (VITAMIN D) 1000 units tablet Take 1 tablet (1,000 Units total) by mouth daily.    . clopidogrel (PLAVIX) 75 MG tablet Take 1 tablet (75 mg total) by mouth daily. 30 tablet 0  . Cyanocobalamin (VITAMIN B-12 PO) Take 1 tablet by mouth daily.    . fluticasone (FLONASE) 50 MCG/ACT nasal spray Place 2 sprays into both nostrils daily as needed for allergies.   11  . montelukast (SINGULAIR) 10 MG tablet TAKE 1 TABLET (10 MG TOTAL) BY MOUTH AT BEDTIME. 90 tablet 3  . triamcinolone cream (KENALOG) 0.1 % Apply 1 application topically 2 (two) times daily. 45 g 0  . polyethylene glycol (MIRALAX / GLYCOLAX) packet Take 17 g by mouth daily. (Patient not taking: Reported on 08/28/2016) 14 each  0   No facility-administered medications prior to visit.      Allergies:   Sulfa antibiotics and Zofran Alvis Lemmings hcl]   Social History   Social History  . Marital status: Married    Spouse name: N/A  . Number of children: 1  . Years of education: N/A   Occupational History  . 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 Smoking status: Former Smoker    Packs/day: 1.00    Years: 25.00    Types: Cigarettes  . Smokeless tobacco: Never Used     Comment: quit age 60     smoked from 55 to 58 off and on  . Alcohol use No  . Drug use: No  . Sexual activity: Yes    Birth control/ protection: Post-menopausal   Other  Topics Concern  . None   Social History Narrative   Marital status:  Married; x 26 years. Second marriage. Happily married; no abuse      Children: one child, one stepson;two grandsons      Lives: with husband, mother in 2017      Employment:  [employer] part-time 25-30 hours per week; current job x 19 years.      Tobacco:  None      Alcohol: rare/minimal.      Drugs:  None       Exercise: none in 2017       Seatbelt:  100%.       Sunscreen: SPF 15.      Guns:  Maybe?     Family History:  The patient's family history includes Arthritis in her brother; Breast cancer (age of onset: 48) in her sister; Cervical cancer in her sister; Congestive Heart Failure in her father and mother; Dementia in her mother; Diabetes in her brother, father, mother, and sister; Heart disease in her father, maternal grandfather, mother, paternal grandfather, and paternal grandmother; Hepatitis C (age of onset: 26) in her sister; Hyperlipidemia in her brother, father, and mother; Hypertension in her brother, father, mother, sister, and sister; Kidney disease in her father and sister; Kidney failure in her father; Liver cancer (age of onset: 11) in her sister; Stroke in her father, maternal grandmother, paternal grandfather, and paternal grandmother.   ROS:   Please see the history of present illness.    ROS All other systems reviewed and are negative.   PHYSICAL EXAM:   VS:  BP 108/70   Pulse (!) 57   Ht 5' 2.5" (1.588 m)   Wt 205 lb 12.8 oz (93.4 kg)   SpO2 98%   BMI 37.04 kg/m    GEN: Well nourished, well developed, in no acute distress  HEENT: normal  Neck: no JVD, carotid bruits, or masses Cardiac: RRR; no murmurs, rubs, or gallops,no edema  Respiratory:  clear to auscultation bilaterally, normal work of breathing GI: soft, nontender, nondistended, + BS MS: no deformity or atrophy  Skin: warm and dry, no rash Neuro:  Alert and Oriented x 3, Strength and sensation are intact Psych: euthymic mood,  full affect  Wt Readings from Last 3 Encounters:  09/02/16 205 lb 12.8 oz (93.4 kg)  08/28/16 204 lb (92.5 kg)  08/23/16 206 lb 3.2 oz (93.5 kg)      Studies/Labs Reviewed:   EKG:  None today  Recent Labs: 08/20/2016: TSH 0.564 08/24/2016: Hemoglobin 11.7 08/28/2016: ALT 28; BUN 25; Creatinine, Ser 0.89; Platelets 298; Potassium 4.8; Sodium 142   Lipid Panel  Component Value Date/Time   CHOL 128 08/21/2016 0147   TRIG 56 08/21/2016 0147   HDL 53 08/21/2016 0147   CHOLHDL 2.4 08/21/2016 0147   VLDL 11 08/21/2016 0147   LDLCALC 64 08/21/2016 0147    Additional studies/ records that were reviewed today include:  Hospital records, echocardiogram, EKG reviewed    ASSESSMENT:    1. Old MI (myocardial infarction)   2. Cerebrovascular accident (CVA), unspecified mechanism ([place])   3. Palpitation   4. Coronary artery disease involving native coronary artery of native heart without angina pectoris      PLAN:  In order of problems listed above:  Stroke 08/20/16  - Symptoms of right-sided sensation of heaviness, feeling of concrete. She is a [employer] park manager. Her symptoms have resolved thankfully. She did have some bradycardia surrounding her stroke acute on chronic. Echocardiogram showed normal ejection fraction. Loop recorder was placed and thus far has shown no atrial fibrillation. She is currently on both aspirin and Plavix. Stroke team would like her to be on Plavix long-term. She may be able to stop her aspirin seen.  Palpitations  - Loop recorder in place. She has felt some pounding in her chest previously.  Coronary artery disease status post old MI apical  - Table, no anginal symptoms, no shortness of breath.  Hyperlipidemia  - Continue with atorvastatin.  Medication Adjustments/Labs and Tests Ordered: Current medicines are reviewed at length with the patient today.  Concerns regarding medicines are outlined above.  Medication changes, Labs and Tests ordered today are  listed in the Patient Instructions below. Patient Instructions  Medication Instructions:  The current medical regimen is effective;  continue present plan and medications.  Follow-Up: Follow up in 6 months with Bonney Leitz, PA.  You will receive a letter in the mail 2 months before you are due.  Please call us when you receive this letter to schedule your follow up appointment.  If you need a refill on your cardiac medications before your next appointment, please call your pharmacy.  Thank you for choosing [facility]!!        Signed, Candee Furbish, MD  09/02/2016 10:39 AM    [address], [place], [place]  [address] Phone: [phone]; Fax: [phone]

## 2016-09-02 NOTE — Telephone Encounter (Signed)
Phoned home left voicemail dr Letta Pate has earlier appt - this Friday - she wants earlier than 03.26.18

## 2016-09-02 NOTE — Progress Notes (Signed)
Loop wound check in clinic. Wound open to air without redness, swelling or drainage. Wound edges approximated. Battery status: good. R-waves 0.59mV. 0 symptom episodes, 0 tachy episodes, 0 pause episodes, 0 brady episodes. 0 AF episodes. Elaine Jackson was educated about wound care and Carelink monitoring. Monthly summary reports and ROV with SK in 12 months.

## 2016-09-02 NOTE — Patient Instructions (Signed)
Medication Instructions:  The current medical regimen is effective;  continue present plan and medications.  Follow-Up: Follow up in 6 months with Katy Thompson, PA.  You will receive a letter in the mail 2 months before you are due.  Please call us when you receive this letter to schedule your follow up appointment.  If you need a refill on your cardiac medications before your next appointment, please call your pharmacy.  Thank you for choosing [facility]!!     

## 2016-09-04 ENCOUNTER — Ambulatory Visit: Payer: BLUE CROSS/BLUE SHIELD

## 2016-09-05 NOTE — Discharge Summary (Signed)
Physician Discharge Summary  Elaine Jackson QPY:[id number] DOB: June 25, 1955 DOA: 08/20/2016  PCP: Reginia Forts, MD  Admit date: 08/20/2016 Discharge date: 09/05/2016  Time spent: > 35 minutes  Recommendations for Outpatient Follow-up:  1. Ensure continued PT and f/u with cardiologist as outpatient.   Discharge Diagnoses:  Principal Problem:   Vertebral artery dissection (HCC) Active Problems:   Hx of laparoscopic gastric banding   Diabetes mellitus type II, controlled ([place])   Essential hypertension, benign   Dyslipidemia   Class 2 obesity due to excess calories with serious comorbidity and body mass index (BMI) of 36.0 to 36.9 in adult   Vitamin D deficiency   Coronary artery disease involving native coronary artery of native heart without angina pectoris   Acute kidney injury ([place])   Left thyroid nodule   Bradycardia   Abnormal urinalysis   Diabetes mellitus with complication (HCC)   Vertebrobasilar occlusive disease   Acute CVA (cerebrovascular accident) ([place])   Neurologic gait disorder   History of migraine   Benign essential HTN   Diabetes mellitus type 2 in obese (HCC)   Slow transit constipation   Discharge Condition: stable  Diet recommendation: diabetic  There were no vitals filed for this visit.  History of present illness:  61 y.o. female 61 y.o. femalewith a history of DMT2, HTN, HLD, obesity s/p lap banding, CAD s/p apical MI (11/2014), normal LVF by echo June 2016, and prior sinus bradycardia who presented to [facility] on 08/20/16 for evaluation of dizziness, slurred speech and HA. MRI shows multiple small foci of acute ischemia within the right cerebellar hemisphere, within the PICA territory. CTA neck shows tapering of the right vertebral artery, which becomes occluded at the level of C5. The appearance is concerning for dissection. Cardiology consulted due to bradycardia.   Hospital Course:  Principal Problem: Vertebral artery dissection +acute CVA: right PICA  territoryinfarcts in setting of R VA and R PICA occlusion. Infarcts secondary to large vessel source - Neurology managed while patient in house. - Awaiting admission into CIR and loop recorder insertion. Cardiology on board and making plans.  Active Problems: Acute kidney injury  -Baseline renal function: 22/0.91 -Current renal function: 35/1.2  Left thyroid nodule - Incidental finding on CTA neck - TSH within normal limits  Bradycardia -Evaluated by cardiology in the ER; bradycardia determined to be chronic in nature and asymptomatic - echocardiogram pending  Abnormal urinalysis -Asymptomatic -Check urine culture  Diabetes mellitus type II, controlled  -Diet controlled at home -Follow CBGs  Essential hypertension, benign -Allowing for permissive hypertension (see above)  Coronary artery disease involving native coronary artery of native heart without angina pectoris -Asymptomatic -On statin and aspirin prior to admission -No beta blocker secondary to bradycardia  Hx of laparoscopic gastric banding  Dyslipidemia -Continue statin  Procedures:  None  Consultations:  Cardiology  neurology  Discharge Exam: Vitals:   08/23/16 1335 08/23/16 1644  BP: (!) 113/56 133/68  Pulse: 67 (!) 53  Resp: 18 18  Temp: 98.2 F (36.8 C) 98.4 F (36.9 C)    General: Pt in nad, alert and awake Cardiovascular: rrr, no rubs Respiratory: no increased wob, no wheezes  Discharge Instructions   Discharge Instructions    Ambulatory referral to Neurology    Complete by:  As directed    Follow up with NP Cecille Rubin at [facility] in about 2 months. Thanks.     Discharge Medication List as of 08/23/2016  4:56 PM    CONTINUE these medications which have  NOT CHANGED   Details  Cyanocobalamin (VITAMIN B-12 PO) Take 1 tablet by mouth daily., Historical Med    fluticasone (FLONASE) 50 MCG/ACT nasal spray Place 2 sprays into both nostrils daily as needed.  For allergies, Starting Sat 05/27/2015, Historical Med    montelukast (SINGULAIR) 10 MG tablet TAKE 1 TABLET (10 MG TOTAL) BY MOUTH AT BEDTIME., Normal    aspirin EC 81 MG EC tablet Take 1 tablet (81 mg total) by mouth daily., Starting Wed 12/21/2014, OTC    atorvastatin (LIPITOR) 80 MG tablet TAKE 1 TABLET (80 MG TOTAL) BY MOUTH DAILY AT 6 PM., Normal    cholecalciferol (VITAMIN D) 1000 UNITS tablet Take 1,000 Units by mouth daily. , Historical Med    clopidogrel (PLAVIX) 75 MG tablet Take 1 tablet (75 mg total) by mouth daily., Starting Thu 12/22/2014, Normal    Lancets (ONETOUCH ULTRASOFT) lancets Use as instructed -- check sugar once daily One touch ultra 2, Phone In    losartan-hydrochlorothiazide (HYZAAR) 100-25 MG tablet Take 1 tablet by mouth daily., Starting Wed 05/01/2016, Normal    nitroGLYCERIN (NITROSTAT) 0.4 MG SL tablet Place 1 tablet (0.4 mg total) under the tongue every 5 (five) minutes x 3 doses as needed for chest pain., Starting Wed 12/21/2014, Normal    ONE TOUCH ULTRA TEST test strip USE TO TEST BLOOD SUGAR ONCE DAILY. E11.9, Normal       Allergies  Allergen Reactions  . Sulfa Antibiotics Nausea And Vomiting  . Zofran [Ondansetron Hcl] Other (See Comments)    Oral numbness with IV dosing.    Follow-up Information    Dennie Bible, NP. Schedule an appointment as soon as possible for a visit in 6 week(s).   Specialty:  Family Medicine Contact information: [address] [place] [address] [phone]        [facility] Follow up on 09/04/2016.   Specialty:  Cardiology Why:  11:00AM, wound check Contact information: [address], [place] [phone]           The results of significant diagnostics from this hospitalization (including imaging, microbiology, ancillary and laboratory) are listed below for reference.    Significant Diagnostic Studies: Ct Angio Head W  Or Wo Contrast  Result Date: 08/20/2016 CLINICAL DATA:  Headache and vertigo EXAM: CT ANGIOGRAPHY HEAD AND NECK TECHNIQUE: Multidetector CT imaging of the head and neck was performed using the standard protocol during bolus administration of intravenous contrast. Multiplanar CT image reconstructions and MIPs were obtained to evaluate the vascular anatomy. Carotid stenosis measurements (when applicable) are obtained utilizing NASCET criteria, using the distal internal carotid diameter as the denominator. CONTRAST:  50 mL Isovue 370 IV COMPARISON:  Head CT 08/20/2016 FINDINGS: CT HEAD FINDINGS Brain: No mass lesion, intraparenchymal hemorrhage or extra-axial collection. No evidence of acute cortical infarct. Mild volume loss. There is periventricular hypoattenuation compatible with chronic microvascular disease. Vascular: No hyperdense vessel or unexpected calcification. Skull: Normal visualized skull base, calvarium and extracranial soft tissues. Sinuses/Orbits: No sinus fluid levels or advanced mucosal thickening. No mastoid effusion. Normal orbits. CTA NECK FINDINGS Aortic arch: There is no aneurysm or dissection of the visualized ascending aorta or aortic arch. There is a normal variant aortic arch branching pattern with the brachiocephalic and left common carotid arteries sharing a common origin. The visualized proximal subclavian arteries are normal. Mild aortic arch atherosclerotic calcification. Right carotid system: The right common carotid origin is widely patent. There is  no common carotid or internal carotid artery dissection or aneurysm. There is mild atherosclerotic calcification at the carotid bifurcation without hemodynamically significant stenosis. Left carotid system: The left common carotid origin is widely patent. There is no common carotid or internal carotid artery dissection or aneurysm. No hemodynamically significant stenosis. Vertebral arteries: The vertebral system is left dominant. Both  vertebral artery origins are normal. There is progressive narrowing of the right vertebral artery, which becomes occluded at the C5 level. Minimal opacification of the distal right V4 segment likely secondary to retrograde flow. The left vertebral artery is normal. Skeleton: There is no bony spinal canal stenosis. No lytic or blastic lesions. Other neck: The nasopharynx is clear. The oropharynx and hypopharynx are normal. The epiglottis is normal. The supraglottic larynx, glottis and subglottic larynx are normal. No retropharyngeal collection. The parapharyngeal spaces are preserved. The parotid and submandibular glands are normal. No sialolithiasis or salivary ductal dilatation. There is a heterogeneous left thyroid nodule measuring 2 cm. There is no cervical lymphadenopathy. Upper chest: No pneumothorax or pleural effusion. No nodules or masses. Review of the MIP images confirms the above findings CTA HEAD FINDINGS Anterior circulation: --Intracranial internal carotid arteries: Normal. --Anterior cerebral arteries: Normal. --Middle cerebral arteries: Normal. --Posterior communicating arteries: Absent bilaterally. Posterior circulation: --Posterior cerebral arteries: Normal. --Superior cerebellar arteries: Normal. --Basilar artery: Normal. --Anterior inferior cerebellar arteries: Present on the left. Not clearly seen on the right. --Posterior inferior cerebellar arteries: Normal on the left. Not seen on the right. Venous sinuses: As permitted by contrast timing, patent. Anatomic variants: None Delayed phase: No parenchymal contrast enhancement. Review of the MIP images confirms the above findings IMPRESSION: 1. Tapering of the right vertebral artery, which becomes occluded at the level of C5. The appearance is concerning for dissection. 2. Non opacification of the right anterior and posterior inferior cerebellar arteries. Given the clinical context, the findings are compatible with posterior fossa ischemia. Further  evaluation with MRI may be helpful to evaluate for an acute cerebellar infarct. 3. 2 cm left thyroid nodule. Nonemergent follow-up with dedicated thyroid ultrasound is recommended. 4. Variant arch anatomy with the brachiocephalic and left common carotid arteries sharing a common origin. No right subclavian stenosis. Wide patency of right vertebral artery origin. Critical Value/emergent results were called by telephone at the time of interpretation on 08/20/2016 at 3:08 pm to Dr. Charlesetta Shanks , who verbally acknowledged these results. Electronically Signed   By: Ulyses Jarred M.D.   On: 08/20/2016 15:16   Ct Angio Neck W And/or Wo Contrast  Result Date: 08/20/2016 CLINICAL DATA:  Headache and vertigo EXAM: CT ANGIOGRAPHY HEAD AND NECK TECHNIQUE: Multidetector CT imaging of the head and neck was performed using the standard protocol during bolus administration of intravenous contrast. Multiplanar CT image reconstructions and MIPs were obtained to evaluate the vascular anatomy. Carotid stenosis measurements (when applicable) are obtained utilizing NASCET criteria, using the distal internal carotid diameter as the denominator. CONTRAST:  50 mL Isovue 370 IV COMPARISON:  Head CT 08/20/2016 FINDINGS: CT HEAD FINDINGS Brain: No mass lesion, intraparenchymal hemorrhage or extra-axial collection. No evidence of acute cortical infarct. Mild volume loss. There is periventricular hypoattenuation compatible with chronic microvascular disease. Vascular: No hyperdense vessel or unexpected calcification. Skull: Normal visualized skull base, calvarium and extracranial soft tissues. Sinuses/Orbits: No sinus fluid levels or advanced mucosal thickening. No mastoid effusion. Normal orbits. CTA NECK FINDINGS Aortic arch: There is no aneurysm or dissection of the visualized ascending aorta or aortic arch. There is a  normal variant aortic arch branching pattern with the brachiocephalic and left common carotid arteries sharing a common  origin. The visualized proximal subclavian arteries are normal. Mild aortic arch atherosclerotic calcification. Right carotid system: The right common carotid origin is widely patent. There is no common carotid or internal carotid artery dissection or aneurysm. There is mild atherosclerotic calcification at the carotid bifurcation without hemodynamically significant stenosis. Left carotid system: The left common carotid origin is widely patent. There is no common carotid or internal carotid artery dissection or aneurysm. No hemodynamically significant stenosis. Vertebral arteries: The vertebral system is left dominant. Both vertebral artery origins are normal. There is progressive narrowing of the right vertebral artery, which becomes occluded at the C5 level. Minimal opacification of the distal right V4 segment likely secondary to retrograde flow. The left vertebral artery is normal. Skeleton: There is no bony spinal canal stenosis. No lytic or blastic lesions. Other neck: The nasopharynx is clear. The oropharynx and hypopharynx are normal. The epiglottis is normal. The supraglottic larynx, glottis and subglottic larynx are normal. No retropharyngeal collection. The parapharyngeal spaces are preserved. The parotid and submandibular glands are normal. No sialolithiasis or salivary ductal dilatation. There is a heterogeneous left thyroid nodule measuring 2 cm. There is no cervical lymphadenopathy. Upper chest: No pneumothorax or pleural effusion. No nodules or masses. Review of the MIP images confirms the above findings CTA HEAD FINDINGS Anterior circulation: --Intracranial internal carotid arteries: Normal. --Anterior cerebral arteries: Normal. --Middle cerebral arteries: Normal. --Posterior communicating arteries: Absent bilaterally. Posterior circulation: --Posterior cerebral arteries: Normal. --Superior cerebellar arteries: Normal. --Basilar artery: Normal. --Anterior inferior cerebellar arteries: Present on the  left. Not clearly seen on the right. --Posterior inferior cerebellar arteries: Normal on the left. Not seen on the right. Venous sinuses: As permitted by contrast timing, patent. Anatomic variants: None Delayed phase: No parenchymal contrast enhancement. Review of the MIP images confirms the above findings IMPRESSION: 1. Tapering of the right vertebral artery, which becomes occluded at the level of C5. The appearance is concerning for dissection. 2. Non opacification of the right anterior and posterior inferior cerebellar arteries. Given the clinical context, the findings are compatible with posterior fossa ischemia. Further evaluation with MRI may be helpful to evaluate for an acute cerebellar infarct. 3. 2 cm left thyroid nodule. Nonemergent follow-up with dedicated thyroid ultrasound is recommended. 4. Variant arch anatomy with the brachiocephalic and left common carotid arteries sharing a common origin. No right subclavian stenosis. Wide patency of right vertebral artery origin. Critical Value/emergent results were called by telephone at the time of interpretation on 08/20/2016 at 3:08 pm to Dr. Charlesetta Shanks , who verbally acknowledged these results. Electronically Signed   By: Ulyses Jarred M.D.   On: 08/20/2016 15:16   Mr Jodene Nam Head Wo Contrast  Result Date: 08/20/2016 CLINICAL DATA:  Vertebral artery dissection.  Dizziness. EXAM: MRI HEAD WITHOUT CONTRAST MRA HEAD WITHOUT CONTRAST TECHNIQUE: Multiplanar, multiecho pulse sequences of the brain and surrounding structures were obtained without intravenous contrast. Angiographic images of the head were obtained using MRA technique without contrast. COMPARISON:  CTA head and neck 08/20/2016 FINDINGS: MRI HEAD FINDINGS Brain: There are multiple punctate foci of diffusion restriction within the right cerebellar hemisphere, within the right PICA territory. No other areas of diffusion restriction. No evidence of hemorrhage. There is an old left cerebellar infarct.  The brain parenchymal signal is normal. No mass lesion or midline shift. No hydrocephalus or extra-axial fluid collection. The midline structures are normal. No age advanced or  lobar predominant atrophy. Vascular: Major intracranial arterial and venous sinus flow voids are preserved. No evidence of chronic microhemorrhage or amyloid angiopathy. Skull and upper cervical spine: The visualized skull base, calvarium, upper cervical spine and extracranial soft tissues are normal. Sinuses/Orbits: No fluid levels or advanced mucosal thickening. No mastoid effusion. Normal orbits. MRA HEAD FINDINGS Intracranial internal carotid arteries: Normal. Anterior cerebral arteries: Normal. Middle cerebral arteries: Normal. Posterior communicating arteries: Present bilaterally. Posterior cerebral arteries: Normal. Basilar artery: Normal. Vertebral arteries: Left dominant. There is no flow related enhancement seen within the proximal V4 segment of the right vertebral artery. Flow related enhancement within a short segment of the most distal right V4 segment is likely due to retrograde filling. Superior cerebellar arteries: Normal. Anterior inferior cerebellar arteries: Normal. Posterior inferior cerebellar arteries: No flow related enhancement of the right PICA. Left PICA is normal. IMPRESSION: 1. Multiple small foci of acute ischemia within the right cerebellar hemisphere, within the PICA territory. No hematoma or mass effect. 2. 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 3. Old left cerebellar infarct. Electronically Signed   By: Ulyses Jarred M.D.   On: 08/20/2016 20:41   Mr Brain Wo Contrast  Result Date: 08/20/2016 CLINICAL DATA:  Vertebral artery dissection.  Dizziness. EXAM: MRI HEAD WITHOUT CONTRAST MRA HEAD WITHOUT CONTRAST TECHNIQUE: Multiplanar, multiecho pulse sequences of the brain and surrounding structures were obtained without intravenous contrast.  Angiographic images of the head were obtained using MRA technique without contrast. COMPARISON:  CTA head and neck 08/20/2016 FINDINGS: MRI HEAD FINDINGS Brain: There are multiple punctate foci of diffusion restriction within the right cerebellar hemisphere, within the right PICA territory. No other areas of diffusion restriction. No evidence of hemorrhage. There is an old left cerebellar infarct. The brain parenchymal signal is normal. No mass lesion or midline shift. No hydrocephalus or extra-axial fluid collection. The midline structures are normal. No age advanced or lobar predominant atrophy. Vascular: Major intracranial arterial and venous sinus flow voids are preserved. No evidence of chronic microhemorrhage or amyloid angiopathy. Skull and upper cervical spine: The visualized skull base, calvarium, upper cervical spine and extracranial soft tissues are normal. Sinuses/Orbits: No fluid levels or advanced mucosal thickening. No mastoid effusion. Normal orbits. MRA HEAD FINDINGS Intracranial internal carotid arteries: Normal. Anterior cerebral arteries: Normal. Middle cerebral arteries: Normal. Posterior communicating arteries: Present bilaterally. Posterior cerebral arteries: Normal. Basilar artery: Normal. Vertebral arteries: Left dominant. There is no flow related enhancement seen within the proximal V4 segment of the right vertebral artery. Flow related enhancement within a short segment of the most distal right V4 segment is likely due to retrograde filling. Superior cerebellar arteries: Normal. Anterior inferior cerebellar arteries: Normal. Posterior inferior cerebellar arteries: No flow related enhancement of the right PICA. Left PICA is normal. IMPRESSION: 1. Multiple small foci of acute ischemia within the right cerebellar hemisphere, within the PICA territory. No hematoma or mass effect. 2. 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 3. Old left  cerebellar infarct. Electronically Signed   By: Ulyses Jarred M.D.   On: 08/20/2016 20:41   Ct Head Code Stroke W/o Cm  Result Date: 08/20/2016 CLINICAL DATA:  Code stroke. Severe dizziness with RIGHT-sided headache beginning earlier today. EXAM: CT HEAD WITHOUT CONTRAST TECHNIQUE: Contiguous axial images were obtained from the base of the skull through the vertex without intravenous contrast. COMPARISON:  12/19/2014. FINDINGS: Brain: No evidence for acute  infarction, hemorrhage, mass lesion, hydrocephalus, or extra-axial fluid. Mild atrophy. Slight hypoattenuation of white matter, favored to represent chronic microvascular ischemic change. Vascular: Mild vascular calcification in the distal vertebral arteries and carotid siphon segments. No signs of large vessel occlusion. Skull: Normal. Negative for fracture or focal lesion. Sinuses/Orbits: No acute finding. Other: None. ASPECTS [facility] Stroke Program Early CT Score) - Ganglionic level infarction (caudate, lentiform nuclei, internal capsule, insula, M1-M3 cortex): 7 - Supraganglionic infarction (M4-M6 cortex): 3 Total score (0-10 with 10 being normal): 10 IMPRESSION: 1. Atrophy and small vessel disease. No acute intracranial findings. No findings suggestive of large vessel occlusion. 2. ASPECTS is 10 These results were called by telephone at the time of interpretation on 08/20/2016 at 12:15 pm to Dr. Leonel Ramsay , who verbally acknowledged these results. Electronically Signed   By: Staci Righter M.D.   On: 08/20/2016 12:16    Microbiology: No results found for this or any previous visit (from the past 240 hour(s)).   Labs: Basic Metabolic Panel: No results for input(s): NA, K, CL, CO2, GLUCOSE, BUN, CREATININE, CALCIUM, MG, PHOS in the last 168 hours. Liver Function Tests: No results for input(s): AST, ALT, ALKPHOS, BILITOT, PROT, ALBUMIN in the last 168 hours. No results for input(s): LIPASE, AMYLASE in the last 168 hours. No results for input(s):  AMMONIA in the last 168 hours. CBC: No results for input(s): WBC, NEUTROABS, HGB, HCT, MCV, PLT in the last 168 hours. Cardiac Enzymes: No results for input(s): CKTOTAL, CKMB, CKMBINDEX, TROPONINI in the last 168 hours. BNP: BNP (last 3 results) No results for input(s): BNP in the last 8760 hours.  ProBNP (last 3 results) No results for input(s): PROBNP in the last 8760 hours.  CBG: No results for input(s): GLUCAP in the last 168 hours.     Signed:  Velvet Bathe MD.  [facility] 09/05/2016, 12:23 PM

## 2016-09-16 ENCOUNTER — Telehealth: Payer: Self-pay | Admitting: Physical Medicine & Rehabilitation

## 2016-09-16 ENCOUNTER — Ambulatory Visit (HOSPITAL_BASED_OUTPATIENT_CLINIC_OR_DEPARTMENT_OTHER): Payer: BLUE CROSS/BLUE SHIELD | Admitting: Physical Medicine & Rehabilitation

## 2016-09-16 ENCOUNTER — Encounter: Payer: BLUE CROSS/BLUE SHIELD | Attending: Physical Medicine & Rehabilitation

## 2016-09-16 ENCOUNTER — Encounter: Payer: Self-pay | Admitting: Physical Medicine & Rehabilitation

## 2016-09-16 VITALS — BP 155/85 | HR 52 | Resp 14

## 2016-09-16 DIAGNOSIS — I2510 Atherosclerotic heart disease of native coronary artery without angina pectoris: Secondary | ICD-10-CM | POA: Diagnosis not present

## 2016-09-16 DIAGNOSIS — I10 Essential (primary) hypertension: Secondary | ICD-10-CM | POA: Insufficient documentation

## 2016-09-16 DIAGNOSIS — R5383 Other fatigue: Secondary | ICD-10-CM | POA: Insufficient documentation

## 2016-09-16 DIAGNOSIS — Z823 Family history of stroke: Secondary | ICD-10-CM | POA: Insufficient documentation

## 2016-09-16 DIAGNOSIS — Z841 Family history of disorders of kidney and ureter: Secondary | ICD-10-CM | POA: Insufficient documentation

## 2016-09-16 DIAGNOSIS — Z9889 Other specified postprocedural states: Secondary | ICD-10-CM | POA: Diagnosis not present

## 2016-09-16 DIAGNOSIS — E119 Type 2 diabetes mellitus without complications: Secondary | ICD-10-CM | POA: Insufficient documentation

## 2016-09-16 DIAGNOSIS — Z9049 Acquired absence of other specified parts of digestive tract: Secondary | ICD-10-CM | POA: Insufficient documentation

## 2016-09-16 DIAGNOSIS — Z87891 Personal history of nicotine dependence: Secondary | ICD-10-CM | POA: Insufficient documentation

## 2016-09-16 DIAGNOSIS — Z8673 Personal history of transient ischemic attack (TIA), and cerebral infarction without residual deficits: Secondary | ICD-10-CM | POA: Diagnosis not present

## 2016-09-16 DIAGNOSIS — Z8249 Family history of ischemic heart disease and other diseases of the circulatory system: Secondary | ICD-10-CM | POA: Diagnosis not present

## 2016-09-16 DIAGNOSIS — Z8261 Family history of arthritis: Secondary | ICD-10-CM | POA: Diagnosis not present

## 2016-09-16 DIAGNOSIS — Z833 Family history of diabetes mellitus: Secondary | ICD-10-CM | POA: Insufficient documentation

## 2016-09-16 DIAGNOSIS — Z808 Family history of malignant neoplasm of other organs or systems: Secondary | ICD-10-CM | POA: Diagnosis not present

## 2016-09-16 DIAGNOSIS — R001 Bradycardia, unspecified: Secondary | ICD-10-CM | POA: Insufficient documentation

## 2016-09-16 NOTE — Progress Notes (Signed)
Subjective:    Patient ID: Elaine Jackson, female    DOB: 1955-12-30, 61 y.o.   MRN: [id number] 61 y.o. right handed female with history of CAD, HTN, T2DM, Chronic bradycardia, migraines in the past; who 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 She was admitted on 08/20/16 and reported constant HA radiating from right temporal area to periorbital and right posterior neck as well as photophobia.  CTA head/neck done revealing tapering of right VA which becomes occluded at C5 concerning for dissection, non opacification of R-ACA and posterior inferior PCA compatible  Posterior fossa ischemia.  MRI/MRA brain done revealing multiple small foci of ischemia in right cerebellar hemisphere within PICA territory, occlusion of visualized portion of R-VA and right PICA. Neurology recommended adding Plavix to ASA for infarcts due to large vessel source.    Cardiology consulted due to bradycardia and felt that worsening of chronic bradycardia likely due to compression of vagus nerve from vertebral dissection and felt that there was no indications for PPM at this time. Dr. Claiborne Billings recommends long term use of low dose ASA and Plavix. 2 D echo revealed EF 40-34%, grade 1 diastolic dysfunction and no wall abnormality. 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 General activity 0 Relation with others 0 Enjoyment of life 0 What TIME of day is your pain at its worst? no pain Sleep (in general) Good  Pain is worse with: no pain Pain improves  with: no pain Relief from Meds: no pain  Mobility walk without assistance how many minutes can you walk? 30  ability to climb steps?  yes do you drive?  yes Do you have any goals in this area?  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  no  Physicians involved in your care Any changes since last visit?  no   Family History  Problem Relation Age of Onset  . Diabetes Mother   . Heart disease Mother     defibrillator; carotid artery stenosis.  . Hypertension Mother   . Hyperlipidemia Mother   . Dementia Mother   . Congestive Heart Failure Mother   . Heart disease Father   . Kidney failure Father     ESRD/peritoneal dialysis  . Diabetes Father   . Hypertension Father   . Hyperlipidemia Father   . Stroke Father   . Kidney disease Father   . Congestive Heart Failure Father   . Diabetes Sister   . Hypertension Sister   . Kidney disease Sister     renal failure  . Diabetes Brother   . Hypertension Brother   . Hyperlipidemia Brother   . Arthritis Brother   . Cervical cancer Sister   . Hypertension Sister   . Liver cancer Sister 20  . Stroke Maternal Grandmother   . Heart disease Maternal Grandfather   . Stroke Paternal Grandmother   . Heart disease Paternal Grandmother   .  Heart disease Paternal Grandfather   . Stroke Paternal Grandfather   . Breast cancer Sister 66  . Hepatitis C Sister 34   Social History   Social History  . Marital status: Married    Spouse name: N/A  . Number of children: 1  . Years of education: N/A   Occupational History  . 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 Smoking status: Former Smoker    Packs/day: 1.00    Years: 25.00    Types: Cigarettes  . Smokeless tobacco: Never Used     Comment: quit age 74     smoked from 37 to 75 off and on  . Alcohol use No  . Drug use: No  . Sexual activity:  Yes    Birth control/ protection: Post-menopausal   Other Topics Concern  . None   Social History Narrative   Marital status:  Married; x 26 years. Second marriage. Happily married; no abuse      Children: one child, one stepson;two grandsons      Lives: with husband, mother in 2017      Employment:  [employer] part-time 25-30 hours per week; current job x 19 years.      Tobacco:  None      Alcohol: rare/minimal.      Drugs:  None       Exercise: none in 2017       Seatbelt:  100%.       Sunscreen: SPF 15.      Guns:  Maybe?   Past Surgical History:  Procedure Laterality Date  . CARDIAC CATHETERIZATION N/A 12/20/2014   Procedure: Left Heart Cath and Coronary Angiography;  Surgeon: Sanda Klein, MD;  Location: [facility];  Service: Cardiovascular;  Laterality: N/A;  . CHOLECYSTECTOMY    . COLONOSCOPY  06/25/2007   Hung. Normal. Repeat in ten years.  Marland Kitchen LAPAROSCOPIC GASTRIC BANDING  07/03/10   Dr. Sherrin Daisy; [facility]  . LOOP RECORDER INSERTION N/A 08/23/2016   Procedure: Loop Recorder Insertion;  Surgeon: Deboraha Sprang, MD;  Location: [facility];  Service: Cardiovascular;  Laterality: N/A;  . TONSILLECTOMY     Past Medical History:  Diagnosis Date  . Allergic rhinitis, cause unspecified   . Allergy   . CAD (coronary artery disease)    LHC 12/20/2014 showed small to moderate apical infarction due to distal LAD stenosis with reestablished TIMI2 flow. No PCI. Recommended plavix for 1 yr and ASA for life.   . Chicken pox   . Diabetes mellitus   . Family history of breast cancer in first degree relative    sister age 15  . Heart murmur   . Hyperlipidemia   . Hypertension   . Malignant neoplasm skin of face 02/16/2007   Basal Cell Carcinoma  Tafeen.  . Measles   . Obesity, unspecified   . Other chronic nonalcoholic liver disease   . Sleep related leg cramps   . Unspecified vitamin D deficiency    BP (!) 155/85   Pulse (!) 52   Resp 14   SpO2 98%    Opioid Risk Score:   Fall Risk Score:  `1  Depression screen PHQ 2/9  Depression screen [facility] 2/9 08/28/2016 05/01/2016 09/26/2015 05/15/2015 12/19/2014 12/12/2014 09/05/2014  Decreased Interest 0 0 0 0 0 0 0  Down, Depressed, Hopeless 0 0 0 0 0 0 0  PHQ - 2 Score 0 0 0 0 0 0 0  Review of Systems  Constitutional: Negative.   HENT: Negative.   Eyes: Negative.   Respiratory: Negative.   Cardiovascular: Negative.   Gastrointestinal: Negative.   Endocrine: Negative.   Genitourinary: Negative.   Musculoskeletal: Negative.   Allergic/Immunologic: Negative.   Neurological: Negative.   Hematological: Negative.   Psychiatric/Behavioral: Negative.   All other systems reviewed and are negative.      Objective:   Physical Exam  Constitutional: She is oriented to person, place, and time. She appears well-developed and well-nourished.  HENT:  Head: Normocephalic and atraumatic.  Eyes: Conjunctivae and EOM are normal. Pupils are equal, round, and reactive to light.  Neck: Normal range of motion.  Cardiovascular: Normal rate, regular rhythm and normal heart sounds.   No murmur heard. Pulmonary/Chest: Effort normal and breath sounds normal. No respiratory distress. She has no wheezes.  Abdominal: Soft. Bowel sounds are normal. She exhibits no distension. There is no tenderness.  Neurological: She is alert and oriented to person, place, and time.  Psychiatric: She has a normal mood and affect. Her behavior is normal. Judgment and thought content normal.  Nursing note and vitals reviewed.   Neuro:  Eyes without evidence of nystagmus  Tone is normal without evidence of spasticity Cerebellar exam shows no evidence of ataxia on finger nose finger or heel to shin testing No evidence of trunkal ataxia  Motor strength is 5/5 in bilateral deltoid, biceps, triceps, finger flexors and extensors, wrist flexors and extensors, hip flexors, knee flexors and extensors, ankle dorsiflexors, plantar  flexors, invertors and evertors, toe flexors and extensors  Sensory exam is normal to pinprick, proprioception and light touch in the upper and lower limbs   Cranial nerves II- Visual fields are intact to confrontation testing, no blurring of vision III- no evidence of ptosis, upward, downward and medial gaze intact IV- no vertical diplopia or head tilt V- no facial numbness or masseter weakness VI- no pupil abduction weakness VII- no facial droop, good lid closure VII- normal auditory acuity IX- no pharygeal weakness, gag nl X- no pharyngeal weakness, no hoarseness XI- no trap or SCM weakness XII- no glossal weakness        Assessment & Plan:  1. Right cerebellar infarcts with suspected right vertebral artery dissection, I cannot detect any residual neurologic deficit. Her only symptom is fatigue which I would expect post-CVA but which should improve over the next month or so  May resume driving without restrictions  May resume work 4 hours per day starting 09/17/2016 May increase to full time in 4 weeks  No PMR f/u needed  Follow-up neurology, continue aspirin and Plavix  2. History of MI, cryptogenic stroke, follow-up with cardiology for Loop recorder interrogation  3.  Thyroid nodule scheduled for ultrasound of thyroid.

## 2016-09-16 NOTE — Patient Instructions (Signed)
May resume driving without restrictions  May resume work 4 hours per day starting 09/17/2016 May increase to full time in 4 weeks

## 2016-09-16 NOTE — Telephone Encounter (Signed)
Faxed signed disability forms to [employer] - copy to scan center

## 2016-09-18 ENCOUNTER — Ambulatory Visit
Admission: RE | Admit: 2016-09-18 | Discharge: 2016-09-18 | Disposition: A | Discharge status: Home / Self Care | Payer: BLUE CROSS/BLUE SHIELD | Source: Ambulatory Visit | Attending: Family Medicine | Admitting: Family Medicine

## 2016-09-18 DIAGNOSIS — E041 Nontoxic single thyroid nodule: Secondary | ICD-10-CM

## 2016-09-23 ENCOUNTER — Ambulatory Visit (INDEPENDENT_AMBULATORY_CARE_PROVIDER_SITE_OTHER): Payer: BLUE CROSS/BLUE SHIELD | Admitting: *Deleted

## 2016-09-23 DIAGNOSIS — I639 Cerebral infarction, unspecified: Secondary | ICD-10-CM

## 2016-09-23 NOTE — Progress Notes (Signed)
Carelink Summary Report / Loop Recorder 

## 2016-09-25 ENCOUNTER — Other Ambulatory Visit: Payer: Self-pay | Admitting: Family Medicine

## 2016-09-25 ENCOUNTER — Other Ambulatory Visit: Payer: Self-pay

## 2016-09-25 LAB — CUP PACEART REMOTE DEVICE CHECK
Implantable Pulse Generator Implant Date: 20180302
MDC IDC SESS DTM: 20180401200713

## 2016-09-25 NOTE — Telephone Encounter (Signed)
Recieved faxed medication refill request for plavix, according to last note:  No PMR f/u needed  Follow-up neurology, continue aspirin and Plavix  Unsure to go ahead and refill this medication or send to primary care provider for refill, please advise

## 2016-09-25 NOTE — Telephone Encounter (Signed)
Needs to contact Neuro or PCP

## 2016-09-25 NOTE — Telephone Encounter (Signed)
Called [facility] with that information

## 2016-09-26 ENCOUNTER — Other Ambulatory Visit: Payer: Self-pay | Admitting: Family Medicine

## 2016-09-26 DIAGNOSIS — Z1231 Encounter for screening mammogram for malignant neoplasm of breast: Secondary | ICD-10-CM

## 2016-09-27 ENCOUNTER — Telehealth: Payer: Self-pay | Admitting: Family Medicine

## 2016-09-27 DIAGNOSIS — E041 Nontoxic single thyroid nodule: Secondary | ICD-10-CM

## 2016-09-27 NOTE — Telephone Encounter (Signed)
Pt called saying she had a Thyroid ultrasound and was told she needs a needle biopsy. Please advise. Pt callback number is [phone].

## 2016-09-27 NOTE — Telephone Encounter (Signed)
Please see 09/18/16 results and advise

## 2016-10-01 NOTE — Telephone Encounter (Signed)
Call --- I have scheduled her thyroid biopsy.  She should be contacted in the upcoming week with this appointment; I sent her a MyChart message as well.

## 2016-10-02 ENCOUNTER — Other Ambulatory Visit: Payer: Self-pay | Admitting: Cardiology

## 2016-10-02 ENCOUNTER — Telehealth: Payer: Self-pay | Admitting: Cardiology

## 2016-10-02 NOTE — Telephone Encounter (Signed)
Request for surgical clearance:  1. What type of surgery is being performed? Thyroid Biopsy   2. When is this surgery scheduled? Not scheduled  3. Are there any medications that need to be held prior to surgery and how long?Needs a letter stating pt can hold her Plavix 5 days before surgery   4. Name of physician performing surgery?  5. What is your office phone and fax number? [phone] and fax is [phone]

## 2016-10-02 NOTE — Telephone Encounter (Signed)
Will forward to Dr Marlou Porch for review and orders RE: how long to hold Plavix

## 2016-10-04 NOTE — Telephone Encounter (Signed)
Printed and taken to MR to be faxed 

## 2016-10-04 NOTE — Telephone Encounter (Signed)
OK to hold Plavix for 5 days prior to thyroid biopsy to reduce bleeding risk. She is on Plavix secondary to prior CVA as well. Minimally increased short term risk of CVA when holding Plavix for needed procedure.  Elaine Furbish, MD

## 2016-10-14 ENCOUNTER — Telehealth: Payer: Self-pay | Admitting: *Deleted

## 2016-10-14 DIAGNOSIS — I480 Paroxysmal atrial fibrillation: Secondary | ICD-10-CM | POA: Insufficient documentation

## 2016-10-14 NOTE — Telephone Encounter (Signed)
LVMOM regarding sending manual transmission to see Full Report. 1 available ECG, 11 AF episodes noted. [facility] phone number.

## 2016-10-15 ENCOUNTER — Ambulatory Visit
Admission: RE | Admit: 2016-10-15 | Discharge: 2016-10-15 | Disposition: A | Discharge status: Home / Self Care | Payer: BLUE CROSS/BLUE SHIELD | Source: Ambulatory Visit | Attending: Family Medicine | Admitting: Family Medicine

## 2016-10-15 DIAGNOSIS — Z1231 Encounter for screening mammogram for malignant neoplasm of breast: Secondary | ICD-10-CM

## 2016-10-16 ENCOUNTER — Other Ambulatory Visit (HOSPITAL_COMMUNITY)
Admission: RE | Admit: 2016-10-16 | Discharge: 2016-10-16 | Disposition: A | Discharge status: Home / Self Care | Payer: BLUE CROSS/BLUE SHIELD | Source: Ambulatory Visit | Attending: Radiology | Admitting: Radiology

## 2016-10-16 ENCOUNTER — Ambulatory Visit
Admission: RE | Admit: 2016-10-16 | Discharge: 2016-10-16 | Disposition: A | Discharge status: Home / Self Care | Payer: BLUE CROSS/BLUE SHIELD | Source: Ambulatory Visit | Attending: Family Medicine | Admitting: Family Medicine

## 2016-10-16 DIAGNOSIS — E041 Nontoxic single thyroid nodule: Secondary | ICD-10-CM

## 2016-10-17 NOTE — Telephone Encounter (Signed)
LMOVM regarding AF episodes. 1 available ECG reviewed with Dr. Caryl Comes, Appears AFib, recommends patient schedule appointment with AFib Clinic. [facility] number to call back.

## 2016-10-18 ENCOUNTER — Telehealth (HOSPITAL_COMMUNITY): Payer: Self-pay | Admitting: *Deleted

## 2016-10-18 ENCOUNTER — Telehealth: Payer: Self-pay | Admitting: Cardiology

## 2016-10-18 NOTE — Telephone Encounter (Signed)
Elaine Jackson made aware of AF episodes and Dr. Olin Pia recommendation to follow up with the AF Clinic. She reports that she felt uneasy Saturday night when the AF was occurring. I also requested for her to send a manual transmission when she gets home today. She verbalizes understanding and is agreeable.

## 2016-10-18 NOTE — Telephone Encounter (Signed)
New Message   Pt calling because she does not know how to work the loop recorder. She said she was instructed to send in a manual transmission and not an automatic transmission.

## 2016-10-18 NOTE — Telephone Encounter (Signed)
I LMOM for pt to clbk to get scheduled to come to afib clinic next week.

## 2016-10-18 NOTE — Telephone Encounter (Signed)
-----   Message from Ranee Gosselin, RN sent at 10/18/2016  2:04 PM EDT ----- Per Dr. Caryl Comes- Ms. Ellerbrock needs follow up in AF Clinic r/t AF 10/12/16 on LINQ (implanted for cryptogenic stroke). Pt is aware that you will contact to schedule.

## 2016-10-21 ENCOUNTER — Telehealth (HOSPITAL_COMMUNITY): Payer: Self-pay | Admitting: *Deleted

## 2016-10-21 ENCOUNTER — Other Ambulatory Visit: Payer: Self-pay | Admitting: Family Medicine

## 2016-10-21 DIAGNOSIS — C73 Malignant neoplasm of thyroid gland: Secondary | ICD-10-CM

## 2016-10-21 NOTE — Telephone Encounter (Signed)
LMOM x 2 for pt to call and schedule appt with afib clinic.

## 2016-10-21 NOTE — Telephone Encounter (Signed)
-----   Message from Ranee Gosselin, RN sent at 10/18/2016  2:04 PM EDT ----- Per Dr. Caryl Comes- Ms. Buist needs follow up in AF Clinic r/t AF 10/12/16 on LINQ (implanted for cryptogenic stroke). Pt is aware that you will contact to schedule.

## 2016-10-21 NOTE — Telephone Encounter (Signed)
Able to reach patient.  Advised patient of findings of AF on her LINQ.  Patient is agreeable to scheduling appointment with AF Clinic on 10/23/16 at 2:00pm per Dr. Olin Pia recommendation to discuss [facility]  Called patient back with directions to AF Clinic, phone number, and parking code.  Patient is appreciative of information and instructions; she denies additional questions or concerns at this time.

## 2016-10-22 ENCOUNTER — Ambulatory Visit (INDEPENDENT_AMBULATORY_CARE_PROVIDER_SITE_OTHER): Payer: BLUE CROSS/BLUE SHIELD | Admitting: *Deleted

## 2016-10-22 DIAGNOSIS — I639 Cerebral infarction, unspecified: Secondary | ICD-10-CM | POA: Diagnosis not present

## 2016-10-23 ENCOUNTER — Encounter (HOSPITAL_COMMUNITY): Payer: Self-pay | Admitting: Nurse Practitioner

## 2016-10-23 ENCOUNTER — Encounter: Payer: Self-pay | Admitting: Nurse Practitioner

## 2016-10-23 ENCOUNTER — Ambulatory Visit (HOSPITAL_COMMUNITY)
Admission: RE | Admit: 2016-10-23 | Discharge: 2016-10-23 | Disposition: A | Discharge status: Home / Self Care | Payer: BLUE CROSS/BLUE SHIELD | Source: Ambulatory Visit | Attending: Nurse Practitioner | Admitting: Nurse Practitioner

## 2016-10-23 ENCOUNTER — Other Ambulatory Visit: Payer: Self-pay | Admitting: Urgent Care

## 2016-10-23 ENCOUNTER — Ambulatory Visit (INDEPENDENT_AMBULATORY_CARE_PROVIDER_SITE_OTHER): Payer: BLUE CROSS/BLUE SHIELD | Admitting: Nurse Practitioner

## 2016-10-23 VITALS — BP 136/80 | HR 55 | Ht 62.5 in | Wt 205.8 lb

## 2016-10-23 VITALS — BP 114/78 | HR 55 | Ht 62.5 in | Wt 206.4 lb

## 2016-10-23 DIAGNOSIS — Z8249 Family history of ischemic heart disease and other diseases of the circulatory system: Secondary | ICD-10-CM | POA: Insufficient documentation

## 2016-10-23 DIAGNOSIS — I639 Cerebral infarction, unspecified: Secondary | ICD-10-CM

## 2016-10-23 DIAGNOSIS — I2510 Atherosclerotic heart disease of native coronary artery without angina pectoris: Secondary | ICD-10-CM | POA: Diagnosis present

## 2016-10-23 DIAGNOSIS — I252 Old myocardial infarction: Secondary | ICD-10-CM | POA: Insufficient documentation

## 2016-10-23 DIAGNOSIS — E785 Hyperlipidemia, unspecified: Secondary | ICD-10-CM | POA: Diagnosis not present

## 2016-10-23 DIAGNOSIS — Z7902 Long term (current) use of antithrombotics/antiplatelets: Secondary | ICD-10-CM | POA: Insufficient documentation

## 2016-10-23 DIAGNOSIS — I10 Essential (primary) hypertension: Secondary | ICD-10-CM

## 2016-10-23 DIAGNOSIS — Z87891 Personal history of nicotine dependence: Secondary | ICD-10-CM | POA: Insufficient documentation

## 2016-10-23 DIAGNOSIS — R011 Cardiac murmur, unspecified: Secondary | ICD-10-CM | POA: Diagnosis not present

## 2016-10-23 DIAGNOSIS — E119 Type 2 diabetes mellitus without complications: Secondary | ICD-10-CM | POA: Insufficient documentation

## 2016-10-23 DIAGNOSIS — I480 Paroxysmal atrial fibrillation: Secondary | ICD-10-CM

## 2016-10-23 MED ORDER — DILTIAZEM HCL 30 MG PO TABS: ORAL_TABLET | ORAL | 2 refills | Status: DC

## 2016-10-23 MED ORDER — APIXABAN 5 MG PO TABS: 5.0000 mg | ORAL_TABLET | Freq: Two times a day (BID) | ORAL | 0 refills | Status: DC

## 2016-10-23 MED ORDER — APIXABAN 5 MG PO TABS
5.0000 mg | ORAL_TABLET | Freq: Two times a day (BID) | ORAL | 3 refills | Status: DC
Start: 2016-10-23 — End: 2016-12-18

## 2016-10-23 NOTE — Progress Notes (Signed)
Primary Care Physician: Reginia Forts, MD Referring Physician: Dr. Caryl Comes Cardiologist:Dr. Marlou Porch   Elaine Jackson is a 61 y.o. female with a h/o CAD, recent stroke in 07/2016 that is here today for Afib showing up on Linq, implanted at time of stroke. She had afib off and on all day on the 19th. She was mostly aware of some pressure in her neck and fatigue. She is currently on asa/plavix for her stroke. She denies any bleeding history. Chadsvasc score is 6. She has recently found out that she will need thyroid surgery for suspicious  thyroid nodule, has not been scheduled yet.  Today, she denies symptoms of palpitations, chest pain, shortness of breath, orthopnea, PND, lower extremity edema, dizziness, presyncope, syncope, or neurologic sequela. The patient is tolerating medications without difficulties and is otherwise without complaint today.   Past Medical History:  Diagnosis Date  . Allergic rhinitis, cause unspecified   . Allergy   . CAD (coronary artery disease)    LHC 12/20/2014 showed small to moderate apical infarction due to distal LAD stenosis with reestablished TIMI2 flow. No PCI. Recommended plavix for 1 yr and ASA for life.   . Chicken pox   . Diabetes mellitus   . Family history of breast cancer in first degree relative    sister age 25  . Heart murmur   . Hyperlipidemia   . Hypertension   . Malignant neoplasm skin of face 02/16/2007   Basal Cell Carcinoma  Tafeen.  . Measles   . Obesity, unspecified   . Other chronic nonalcoholic liver disease   . Sleep related leg cramps   . Thyroid nodule    10-2016   . Unspecified vitamin D deficiency   . Vision abnormalities    Past Surgical History:  Procedure Laterality Date  . CARDIAC CATHETERIZATION N/A 12/20/2014   Procedure: Left Heart Cath and Coronary Angiography;  Surgeon: Sanda Klein, MD;  Location: [facility];  Service: Cardiovascular;  Laterality: N/A;  . CHOLECYSTECTOMY    . COLONOSCOPY  06/25/2007   Hung. Normal. Repeat in ten years.  Marland Kitchen LAPAROSCOPIC GASTRIC BANDING  07/03/10   Dr. Sherrin Daisy; [facility]  . LOOP RECORDER INSERTION N/A 08/23/2016   Procedure: Loop Recorder Insertion;  Surgeon: Deboraha Sprang, MD;  Location: [facility];  Service: Cardiovascular;  Laterality: N/A;  . TONSILLECTOMY      Current Outpatient Prescriptions  Medication Sig Dispense Refill  . atorvastatin (LIPITOR) 80 MG tablet TAKE 1 TABLET (80 MG TOTAL) BY MOUTH DAILY AT 6 PM. 30 tablet 0  . cholecalciferol (VITAMIN D) 1000 units tablet Take 1 tablet (1,000 Units total) by mouth daily.    . Cyanocobalamin (VITAMIN B-12 PO) Take 1 tablet by mouth daily.    . fluticasone (FLONASE) 50 MCG/ACT nasal spray Place 2 sprays into both nostrils daily as needed for allergies.   11  . montelukast (SINGULAIR) 10 MG tablet TAKE 1 TABLET (10 MG TOTAL) BY MOUTH AT BEDTIME. 90 tablet 3  . triamcinolone cream (KENALOG) 0.1 % Apply 1 application topically 2 (two) times daily. 45 g 0  . apixaban (ELIQUIS) 5 MG TABS tablet Take 1 tablet (5 mg total) by mouth 2 (two) times daily. 60 tablet 0  . diltiazem (CARDIZEM) 30 MG tablet Take 1 tablet every 4 hours AS NEEDED for rapid afib heart rate >100 45 tablet 2   No current facility-administered medications for this encounter.     Allergies  Allergen Reactions  . Sulfa  Antibiotics Nausea And Vomiting  . Zofran [Ondansetron Hcl] Other (See Comments)    Oral numbness with IV dosing.     Social History   Social History  . Marital status: Married    Spouse name: N/A  . Number of children: 1  . Years of education: N/A   Occupational History  . 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 Smoking status: Former Smoker    Packs/day: 1.00    Years: 25.00    Types: Cigarettes  . Smokeless tobacco: Never Used     Comment: quit age 14     smoked from 89 to 53 off and on  . Alcohol use No  . Drug use: No  . Sexual activity: Yes    Birth control/  protection: Post-menopausal   Other Topics Concern  . Not on file   Social History Narrative   Marital status:  Married; x 26 years. Second marriage. Happily married; no abuse      Children: one child, one stepson;two grandsons      Lives: with husband, mother in 2017      Employment:  [employer] part-time 25-30 hours per week; current job x 19 years.      Tobacco:  None      Alcohol: rare/minimal.      Drugs:  None       Exercise: none in 2017       Seatbelt:  100%.       Sunscreen: SPF 15.      Guns:  Maybe?    Family History  Problem Relation Age of Onset  . Diabetes Mother   . Heart disease Mother     defibrillator; carotid artery stenosis.  . Hypertension Mother   . Hyperlipidemia Mother   . Dementia Mother   . Congestive Heart Failure Mother   . Heart disease Father   . Kidney failure Father     ESRD/peritoneal dialysis  . Diabetes Father   . Hypertension Father   . Hyperlipidemia Father   . Stroke Father   . Kidney disease Father   . Congestive Heart Failure Father   . Diabetes Sister   . Hypertension Sister   . Kidney disease Sister     renal failure  . Diabetes Brother   . Hypertension Brother   . Hyperlipidemia Brother   . Arthritis Brother   . Cervical cancer Sister   . Hypertension Sister   . Liver cancer Sister 70  . Stroke Maternal Grandmother   . Heart disease Maternal Grandfather   . Stroke Paternal Grandmother   . Heart disease Paternal Grandmother   . Heart disease Paternal Grandfather   . Stroke Paternal Grandfather   . Breast cancer Sister 29  . Hepatitis C Sister 48    ROS- All systems are reviewed and negative except as per the HPI above  Physical Exam: Vitals:   10/23/16 1402  BP: 114/78  Pulse: (!) 55  Weight: 206 lb 6.4 oz (93.6 kg)  Height: 5' 2.5" (1.588 m)   Wt Readings from Last 3 Encounters:  10/23/16 206 lb 6.4 oz (93.6 kg)  10/23/16 205 lb 12.8 oz (93.4 kg)  09/02/16 205 lb 12.8 oz (93.4 kg)    Labs: Lab  Results  Component Value Date   NA 142 08/28/2016   K 4.8 08/28/2016   CL 103 08/28/2016   CO2 21 08/28/2016   GLUCOSE 98 08/28/2016   BUN 25 08/28/2016   CREATININE 0.89  08/28/2016   CALCIUM 9.7 08/28/2016   Lab Results  Component Value Date   INR 0.97 08/20/2016   Lab Results  Component Value Date   CHOL 128 08/21/2016   HDL 53 08/21/2016   LDLCALC 64 08/21/2016   TRIG 56 08/21/2016     GEN- The patient is well appearing, alert and oriented x 3 today.   Head- normocephalic, atraumatic Eyes-  Sclera clear, conjunctiva pink Ears- hearing intact Oropharynx- clear Neck- supple, no JVP Lymph- no cervical lymphadenopathy Lungs- Clear to ausculation bilaterally, normal work of breathing Heart- Regular rate and rhythm, no murmurs, rubs or gallops, PMI not laterally displaced GI- soft, NT, ND, + BS Extremities- no clubbing, cyanosis, or edema MS- no significant deformity or atrophy Skin- no rash or lesion Psych- euthymic mood, full affect Neuro- strength and sensation are intact  EKG-Sinus brady at 55 bpm, pr int 136 ms, qrs int 86 ms, qtc 417 ms LinQ strips reviewed.                         Echo-LV EF: 60% -   65%  ------------------------------------------------------------------- Indications:      CVA 436.  ------------------------------------------------------------------- History:   PMH:   Murmur.  Coronary artery disease.  PMH: Myocardial infarction.  Risk factors:  Former tobacco use. Hypertension. Diabetes mellitus. Dyslipidemia.  ------------------------------------------------------------------- Study Conclusions  - Left ventricle: The cavity size was normal. Wall thickness was   normal. Systolic function was normal. The estimated ejection   fraction was in the range of 60% to 65%. Wall motion was normal;   there were no regional wall motion abnormalities. Doppler   parameters are consistent with abnormal left ventricular   relaxation (grade 1  diastolic dysfunction). - Aortic valve: There was trivial regurgitation.  Impressions:  - No cardiac source of emboli was indentified.    Assessment and Plan: 1. Paroxysmal afib Recorded on linq, s/p stroke General education re afib discussed Stop asa and plavix Start eliquis 5 mg bid  Bleeding precautions discussed Denies bleeding history 30 mg cardizem if needed for rapid heart beat   f/u in one month  CBC at that time  Butch Penny C. Jamaris Theard, [facility] [address] [place], [place] [address] [phone]

## 2016-10-23 NOTE — Patient Instructions (Addendum)
Stressed the importance of management of risk factors to prevent further stroke Continue Asa and Plavix for secondary stroke prevention Maintain strict control of hypertension with blood pressure goal below 130/90, today's reading136/80 Control of diabetes with hemoglobin A1c below 6.5 followed by primary care most recent hemoglobin A1c5.8 Cholesterol with LDL cholesterol less than 70, followed by primary care,  most recent 64, continue Lipitor Exercise by walking, slowly increase , eat healthy diet with whole grains,  fresh fruits and vegetables Follow up 6 months

## 2016-10-23 NOTE — Patient Instructions (Signed)
Your physician has recommended you make the following change in your medication:  1)STOP plavix 2)STOP aspirin 3)Start Eliquis 5mg  twice a day 4)Cardizem 30mg  -- take 1 tablet every 4 hours AS NEEDED for rapid afib heart rate >100

## 2016-10-23 NOTE — Progress Notes (Signed)
Carelink Summary Report / Loop Recorder 

## 2016-10-23 NOTE — Progress Notes (Signed)
[facility]  PATIENT: Elaine Jackson DOB: 1956/02/03   REASON FOR VISIT: Hospital follow-up for stroke right PICA territory infarcts secondary to large vessel source HISTORY FROM: Patient    HISTORY OF PRESENT ILLNESS: Elaine Jackson, 61 year old female was admitted to the hospital 08/20/2016 after experiencing sudden onset of dizziness and nausea. She had a history of migraine headaches approximately 35 years ago. On arrival she was complaining of a constant headache which was located in the right temporal region. She reported over the past several years she had ocular migraines but no actual headache. She also remembers having a couple of heart palpitations which woke her up from sleep but was told to stop her Plavix by her cardiologist last November and she is only on aspirin on admission. MRI of the brain , stroke right PICA territory infarcts secondary to large vessel source. CTA head and neck right VA tapering to occlusion, right AICA and right PICA is not seen. 2 cm left thyroid nodule LDL 64 hemoglobin A1c 5.8 2-D echo 60-65% Loop recorder was placed on 08/23/2016. She returns to the stroke clinic today for follow-up. She has an appointment with the atrial fib clinic this afternoon. Atrial fibrillation was seen on her Loop recorder in the last week. She is currently on Plavix and asa for  secondary prevention without further stroke or TIA symptoms. She was made aware she will probably be placed on eliquis. She is on Lipitor for secondary stroke prevention and history of CAD and stent. She denies any palpitations. She denies any residual deficits from her stroke. She feels she is back to baseline. She is exercising by walking 30-45 minutes daily   REVIEW OF SYSTEMS: Full 14 system review of systems performed and notable only for those listed, all others are neg:  Constitutional: neg  Cardiovascular: neg Ear/Nose/Throat: neg  Skin: neg Eyes: neg Respiratory:  neg Gastroitestinal: neg  Hematology/Lymphatic: neg  Endocrine: neg Musculoskeletal:neg Allergy/ImmunoSeasonal allergies Neurological: neg Psychiatric: neg Sleep : neg   ALLERGIES: Allergies  Allergen Reactions  . Sulfa Antibiotics Nausea And Vomiting  . Zofran [Ondansetron Hcl] Other (See Comments)    Oral numbness with IV dosing.     HOME MEDICATIONS: Outpatient Medications Prior to Visit  Medication Sig Dispense Refill  . aspirin EC 325 MG EC tablet Take 1 tablet (325 mg total) by mouth daily. 30 tablet 0  . atorvastatin (LIPITOR) 80 MG tablet TAKE 1 TABLET (80 MG TOTAL) BY MOUTH DAILY AT 6 PM. 30 tablet 0  . cholecalciferol (VITAMIN D) 1000 units tablet Take 1 tablet (1,000 Units total) by mouth daily.    . clopidogrel (PLAVIX) 75 MG tablet TAKE 1 TABLET (75 MG TOTAL) BY MOUTH DAILY. 30 tablet 0  . Cyanocobalamin (VITAMIN B-12 PO) Take 1 tablet by mouth daily.    . fluticasone (FLONASE) 50 MCG/ACT nasal spray Place 2 sprays into both nostrils daily as needed for allergies.   11  . montelukast (SINGULAIR) 10 MG tablet TAKE 1 TABLET (10 MG TOTAL) BY MOUTH AT BEDTIME. 90 tablet 3  . montelukast (SINGULAIR) 10 MG tablet TAKE 1 TABLET (10 MG TOTAL) BY MOUTH AT BEDTIME. 90 tablet 1  . triamcinolone cream (KENALOG) 0.1 % Apply 1 application topically 2 (two) times daily. 45 g 0   No facility-administered medications prior to visit.     PAST MEDICAL HISTORY: Past Medical History:  Diagnosis Date  . Allergic rhinitis, cause unspecified   . Allergy   . CAD (coronary artery  disease)    LHC 12/20/2014 showed small to moderate apical infarction due to distal LAD stenosis with reestablished TIMI2 flow. No PCI. Recommended plavix for 1 yr and ASA for life.   . Chicken pox   . Diabetes mellitus   . Family history of breast cancer in first degree relative    sister age 7  . Heart murmur   . Hyperlipidemia   . Hypertension   . Malignant neoplasm skin of face 02/16/2007   Basal  Cell Carcinoma  [place].  . Measles   . Obesity, unspecified   . Other chronic nonalcoholic liver disease   . Sleep related leg cramps   . Thyroid nodule    10-2016   . Unspecified vitamin D deficiency     PAST SURGICAL HISTORY: Past Surgical History:  Procedure Laterality Date  . CARDIAC CATHETERIZATION N/A 12/20/2014   Procedure: Left Heart Cath and Coronary Angiography;  Surgeon: Sanda Klein, MD;  Location: [facility];  Service: Cardiovascular;  Laterality: N/A;  . CHOLECYSTECTOMY    . COLONOSCOPY  06/25/2007   [place]. Normal. Repeat in ten years.  Marland Kitchen LAPAROSCOPIC GASTRIC BANDING  07/03/10   Dr. Sherrin Daisy; [facility]  . LOOP RECORDER INSERTION N/A 08/23/2016   Procedure: Loop Recorder Insertion;  Surgeon: Deboraha Sprang, MD;  Location: [facility];  Service: Cardiovascular;  Laterality: N/A;  . TONSILLECTOMY      FAMILY HISTORY: Family History  Problem Relation Age of Onset  . Diabetes Mother   . Heart disease Mother     defibrillator; carotid artery stenosis.  . Hypertension Mother   . Hyperlipidemia Mother   . Dementia Mother   . Congestive Heart Failure Mother   . Heart disease Father   . Kidney failure Father     ESRD/peritoneal dialysis  . Diabetes Father   . Hypertension Father   . Hyperlipidemia Father   . Stroke Father   . Kidney disease Father   . Congestive Heart Failure Father   . Diabetes Sister   . Hypertension Sister   . Kidney disease Sister     renal failure  . Diabetes Brother   . Hypertension Brother   . Hyperlipidemia Brother   . Arthritis Brother   . Cervical cancer Sister   . Hypertension Sister   . Liver cancer Sister 62  . Stroke Maternal Grandmother   . Heart disease Maternal Grandfather   . Stroke Paternal Grandmother   . Heart disease Paternal Grandmother   . Heart disease Paternal Grandfather   . Stroke Paternal Grandfather   . Breast cancer Sister 59  . Hepatitis C Sister 45    SOCIAL HISTORY: Social History    Social History  . Marital status: Married    Spouse name: N/A  . Number of children: 1  . Years of education: N/A   Occupational History  . 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 Smoking status: Former Smoker    Packs/day: 1.00    Years: 25.00    Types: Cigarettes  . Smokeless tobacco: Never Used     Comment: quit age 77     smoked from 75 to 40 off and on  . Alcohol use No  . Drug use: No  . Sexual activity: Yes    Birth control/ protection: Post-menopausal   Other Topics Concern  . Not on file   Social History Narrative   Marital status:  Married; x 26 years. Second marriage. Happily  married; no abuse      Children: one child, one stepson;two grandsons      Lives: with husband, mother in 2017      Employment:  [employer] part-time 25-30 hours per week; current job x 19 years.      Tobacco:  None      Alcohol: rare/minimal.      Drugs:  None       Exercise: none in 2017       Seatbelt:  100%.       Sunscreen: SPF 15.      Guns:  Maybe?     PHYSICAL EXAM  Vitals:   10/23/16 1005  BP: (!) 136/80   Pulse: (!) 55  Weight: 205 lb 12.8 oz (93.4 kg)  Height: 5' 2.5" (1.588 m)   Body mass index is 37.04 kg/m.  Generalized: Well developed, Obese female in no acute distress  Head: normocephalic and atraumatic,. Oropharynx benign  Neck: Supple, no carotid bruits  Cardiac: Regular rate rhythm, no murmur  Musculoskeletal: No deformity   Neurological examination   Mentation: Alert oriented to time, place, history taking. Attention span and concentration appropriate. Recent and remote memory intact.  Follows all commands speech and language fluent.   Cranial nerve II-XII: Pupils were equal round reactive to light extraocular movements were full, visual field were full on confrontational test. No nystagmus. Facial sensation and strength were normal. hearing was intact to finger rubbing bilaterally. Uvula tongue midline. head turning and shoulder  shrug were normal and symmetric.Tongue protrusion into cheek strength was normal. Motor: normal bulk and tone, full strength in the BUE, BLE, fine finger movements normal, no pronator drift. No focal weakness Sensory: normal and symmetric to light touch, pinprick, and  Vibration, in the upper and lower extremities  Coordination: finger-nose-finger, heel-to-shin bilaterally, no dysmetria Reflexes: 1+ upper lower and symmetric plantar responses were flexor bilaterally. Gait and Station: Rising up from seated position without assistance, normal stance,  moderate stride, good arm swing, smooth turning, able to perform tiptoe, and heel walking without difficulty. Tandem gait is steady  DIAGNOSTIC DATA (LABS, IMAGING, TESTING) - I reviewed patient records, labs, notes, testing and imaging myself where available.  Lab Results  Component Value Date   WBC 8.4 08/28/2016   HGB 11.7 (L) 08/24/2016   HCT 34.5 08/28/2016   MCV 92 08/28/2016   PLT 298 08/28/2016      Component Value Date/Time   NA 142 08/28/2016 1618   K 4.8 08/28/2016 1618   CL 103 08/28/2016 1618   CO2 21 08/28/2016 1618   GLUCOSE 98 08/28/2016 1618   GLUCOSE 104 (H) 08/24/2016 0514   BUN 25 08/28/2016 1618   CREATININE 0.89 08/28/2016 1618   CREATININE 0.91 05/01/2016 0931   CALCIUM 9.7 08/28/2016 1618   PROT 7.0 08/28/2016 1618   ALBUMIN 4.4 08/28/2016 1618   AST 23 08/28/2016 1618   ALT 28 08/28/2016 1618   ALKPHOS 62 08/28/2016 1618   BILITOT 0.5 08/28/2016 1618   GFRNONAA 70 08/28/2016 1618   GFRNONAA 80 02/02/2014 0915   GFRAA 81 08/28/2016 1618   GFRAA >89 02/02/2014 0915   Lab Results  Component Value Date   CHOL 128 08/21/2016   HDL 53 08/21/2016   LDLCALC 64 08/21/2016   TRIG 56 08/21/2016   CHOLHDL 2.4 08/21/2016   Lab Results  Component Value Date   HGBA1C 5.8 (H) 08/21/2016    Lab Results  Component Value Date   TSH 0.564 08/20/2016  ASSESSMENT AND PLAN  62 y.o. year old female  has  a past medical history of CAD (coronary artery disease); Diabetes mellitus; Hyperlipidemia; Hypertension;  Obesity, unspecified; Thyroid nodule; and Unspecified vitamin D deficiency. And stroke here for hospital follow-up .MRI stroke right PICA territory infarcts secondary to large vessel source. Loop recorder in place with atrial fib detected in the last week according to patient. She has an appointment with atrial fib clinic this afternoon.    PLAN: Stressed the importance of management of risk factors to prevent further stroke Continue Asa and Plavix for secondary stroke prevention will probably switch to eliquis Maintain strict control of hypertension with blood pressure goal below 130/90, today's reading136/80 Control of diabetes with hemoglobin A1c below 6.5 followed by primary care most recent hemoglobin A1c5.8 Cholesterol with LDL cholesterol less than 70, followed by primary care,  most recent 64 continue Lipitor Exercise by walking, slowly increase , eat healthy diet with whole grains,  fresh fruits and vegetables Follow up 6 months Discussed risk for recurrent stroke/ TIA and answered additional questions This was a  visit requiring 30 minutes of  medical decision making of high complexity with extensive review of history, hospital chart, counseling and answering questions Dennie Bible, [facility], [facility], APRN  [facility] [address] [place], [place] [address] [phone]

## 2016-10-24 NOTE — Progress Notes (Signed)
I reviewed above note and agree with the assessment and plan.  [name], MD PhD Stroke Neurology 10/24/2016 9:50 AM

## 2016-10-28 ENCOUNTER — Other Ambulatory Visit: Payer: Self-pay | Admitting: Internal Medicine

## 2016-11-01 ENCOUNTER — Ambulatory Visit: Payer: Self-pay | Admitting: General Surgery

## 2016-11-03 LAB — CUP PACEART REMOTE DEVICE CHECK
Date Time Interrogation Session: 20180501203723
MDC IDC PG IMPLANT DT: 20180302

## 2016-11-03 NOTE — Progress Notes (Signed)
Carelink summary report received. Battery status OK. Normal device function. No new symptom episodes, tachy episodes, brady, or pause episodes. 11 AF 4.2% +Eliquis. Monthly summary reports and ROV/PRN

## 2016-11-05 ENCOUNTER — Encounter: Payer: Self-pay | Admitting: Family Medicine

## 2016-11-05 ENCOUNTER — Ambulatory Visit (INDEPENDENT_AMBULATORY_CARE_PROVIDER_SITE_OTHER): Payer: BLUE CROSS/BLUE SHIELD | Admitting: Family Medicine

## 2016-11-05 VITALS — BP 121/77 | HR 58 | Temp 98.2°F | Resp 16 | Ht 62.0 in | Wt 202.6 lb

## 2016-11-05 DIAGNOSIS — I480 Paroxysmal atrial fibrillation: Secondary | ICD-10-CM

## 2016-11-05 DIAGNOSIS — Z8673 Personal history of transient ischemic attack (TIA), and cerebral infarction without residual deficits: Secondary | ICD-10-CM

## 2016-11-05 DIAGNOSIS — J301 Allergic rhinitis due to pollen: Secondary | ICD-10-CM

## 2016-11-05 DIAGNOSIS — G450 Vertebro-basilar artery syndrome: Secondary | ICD-10-CM

## 2016-11-05 DIAGNOSIS — I7774 Dissection of vertebral artery: Secondary | ICD-10-CM

## 2016-11-05 DIAGNOSIS — E785 Hyperlipidemia, unspecified: Secondary | ICD-10-CM

## 2016-11-05 DIAGNOSIS — E118 Type 2 diabetes mellitus with unspecified complications: Secondary | ICD-10-CM

## 2016-11-05 DIAGNOSIS — G43709 Chronic migraine without aura, not intractable, without status migrainosus: Secondary | ICD-10-CM | POA: Diagnosis not present

## 2016-11-05 DIAGNOSIS — Z9884 Bariatric surgery status: Secondary | ICD-10-CM | POA: Diagnosis not present

## 2016-11-05 DIAGNOSIS — I252 Old myocardial infarction: Secondary | ICD-10-CM

## 2016-11-05 DIAGNOSIS — I2510 Atherosclerotic heart disease of native coronary artery without angina pectoris: Secondary | ICD-10-CM | POA: Diagnosis not present

## 2016-11-05 DIAGNOSIS — E041 Nontoxic single thyroid nodule: Secondary | ICD-10-CM | POA: Diagnosis not present

## 2016-11-05 MED ORDER — PREDNISONE 20 MG PO TABS: ORAL_TABLET | ORAL | 0 refills | Status: DC

## 2016-11-05 MED ORDER — AZELASTINE HCL 0.1 % NA SOLN: 2.0000 | Freq: Two times a day (BID) | NASAL | 12 refills | Status: DC

## 2016-11-05 NOTE — Patient Instructions (Signed)
     IF you received an x-ray today, you will receive an invoice from [facility]. Please contact [facility] at [phone] with questions or concerns regarding your invoice.   IF you received labwork today, you will receive an invoice from [facility]. Please contact [facility] at [phone] with questions or concerns regarding your invoice.   Our billing staff will not be able to assist you with questions regarding bills from these companies.  You will be contacted with the lab results as soon as they are available. The fastest way to get your results is to activate your My Chart account. Instructions are located on the last page of this paperwork. If you have not heard from us regarding the results in 2 weeks, please contact this office.     

## 2016-11-05 NOTE — Progress Notes (Signed)
Subjective:    Patient ID: Elaine Jackson, female    DOB: 05-14-1956, 61 y.o.   MRN: [id number]  11/05/2016  Hypertension   HPI This 61 y.o. female presents for two month follow-up of recent CVA, new onset atrial fibrillation, thyroid nodule, hypertension, DMII, hypercholesterolemia.  Atrial fibrillation paroxysmal: stopped ASA and Plavix; Eliquis 5mg  bid.  Rx for Cardizem 30mg  PRN.  Changed from Plavix to Eliquis this month by cardiology.  Follow up with atrial fibrillation clinic on 11/25/16.  CVA: s/p follow-up with neurology on 10/23/16.  No changes made.  Physical therapist released patient.    Thyroid nodule: s/p bx that revealed concerning features for papillary carcinoma.  S/p general surgery consultation.  Bx last three was deep.  Still has bruising.  [facility] will perform surgery; common surgery.  Two fine nerves behind the thyroid; if knicked, will be hoarse the remainder of life.  Bleeding may be an issue.  Total thyroid resection due to large nodules.  Appointment on November 25, 2016 Renato Shin.   Husband is now retired.  Husband is walking the dog.  Trying to walk 30-40 minutes per day.  Cough: horrible cough.  Onset 3 weeks. .  Thick and phlegm clear.  Taking Singulair and Flonase.  No fever.  Going to [place] on Friday.  No sinus pressure.  Lots of nasal congestion and rhinorrhea.  Feels like has a flap.  Does not feel badly.  Sneezing a lot.  Added Claritin without improvement.     BP Readings from Last 3 Encounters:  11/05/16 121/77  10/23/16 114/78  10/23/16 136/80   Wt Readings from Last 3 Encounters:  11/05/16 202 lb 9.6 oz (91.9 kg)  10/23/16 206 lb 6.4 oz (93.6 kg)  10/23/16 205 lb 12.8 oz (93.4 kg)   Immunization History  Administered Date(s) Administered  . Hepatitis A 08/13/2010, 03/20/2011  . Hepatitis B 06/24/2010, 08/13/2010, 12/03/2010, 03/20/2011  . Influenza Split 03/20/2011, 03/02/2012  . Influenza,inj,Quad PF,36+ Mos 04/28/2013, 03/14/2014, 05/01/2016    . Influenza-Unspecified 04/19/2015  . Pneumococcal Polysaccharide-23 06/06/2014  . Pneumococcal-Unspecified 07/18/2008  . Td 11/22/2001  . Tdap 09/02/2011  . Zoster 06/16/2015    Review of Systems  Constitutional: Negative for chills, diaphoresis, fatigue and fever.  HENT: Positive for congestion, postnasal drip, rhinorrhea and sneezing. Negative for ear pain and sore throat.   Eyes: Negative for visual disturbance.  Respiratory: Positive for cough. Negative for shortness of breath.   Cardiovascular: Negative for chest pain, palpitations and leg swelling.  Gastrointestinal: Negative for abdominal pain, constipation, diarrhea, nausea and vomiting.  Endocrine: Negative for cold intolerance, heat intolerance, polydipsia, polyphagia and polyuria.  Neurological: Negative for dizziness, tremors, seizures, syncope, facial asymmetry, speech difficulty, weakness, light-headedness, numbness and headaches.    Past Medical History:  Diagnosis Date  . Allergic rhinitis, cause unspecified   . Allergy   . CAD (coronary artery disease)    LHC 12/20/2014 showed small to moderate apical infarction due to distal LAD stenosis with reestablished TIMI2 flow. No PCI. Recommended plavix for 1 yr and ASA for life.   . Chicken pox   . Diabetes mellitus   . Family history of breast cancer in first degree relative    sister age 51  . Heart murmur   . Hyperlipidemia   . Hypertension   . Malignant neoplasm skin of face 02/16/2007   Basal Cell Carcinoma  Tafeen.  . Measles   . Obesity, unspecified   . Other chronic nonalcoholic liver disease   .  Sleep related leg cramps   . Thyroid nodule    10-2016   . Unspecified vitamin D deficiency   . Vision abnormalities    Past Surgical History:  Procedure Laterality Date  . CARDIAC CATHETERIZATION N/A 12/20/2014   Procedure: Left Heart Cath and Coronary Angiography;  Surgeon: Sanda Klein, MD;  Location: [facility];  Service: Cardiovascular;   Laterality: N/A;  . CHOLECYSTECTOMY    . COLONOSCOPY  06/25/2007   Hung. Normal. Repeat in ten years.  Marland Kitchen LAPAROSCOPIC GASTRIC BANDING  07/03/10   Dr. Sherrin Daisy; [facility]  . LOOP RECORDER INSERTION N/A 08/23/2016   Procedure: Loop Recorder Insertion;  Surgeon: Deboraha Sprang, MD;  Location: [facility];  Service: Cardiovascular;  Laterality: N/A;  . TONSILLECTOMY     Allergies  Allergen Reactions  . Sulfa Antibiotics Nausea And Vomiting  . Zofran [Ondansetron Hcl] Other (See Comments)    Oral numbness with IV dosing.     Social History   Social History  . Marital status: Married    Spouse name: N/A  . Number of children: 1  . Years of education: N/A   Occupational History  . 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 Smoking status: Former Smoker    Packs/day: 1.00    Years: 25.00    Types: Cigarettes  . Smokeless tobacco: Never Used     Comment: quit age 64     smoked from 42 to 23 off and on  . Alcohol use No  . Drug use: No  . Sexual activity: Yes    Birth control/ protection: Post-menopausal   Other Topics Concern  . Not on file   Social History Narrative   Marital status:  Married; x 26 years. Second marriage. Happily married; no abuse      Children: one child, one stepson;two grandsons      Lives: with husband, mother in 2017      Employment:  [employer] part-time 25-30 hours per week; current job x 19 years.      Tobacco:  None      Alcohol: rare/minimal.      Drugs:  None       Exercise: none in 2017       Seatbelt:  100%.       Sunscreen: SPF 15.      Guns:  Maybe?   Family History  Problem Relation Age of Onset  . Diabetes Mother   . Heart disease Mother        defibrillator; carotid artery stenosis.  . Hypertension Mother   . Hyperlipidemia Mother   . Dementia Mother   . Congestive Heart Failure Mother   . Heart disease Father   . Kidney failure Father        ESRD/peritoneal dialysis  . Diabetes Father   .  Hypertension Father   . Hyperlipidemia Father   . Stroke Father   . Kidney disease Father   . Congestive Heart Failure Father   . Diabetes Sister   . Hypertension Sister   . Kidney disease Sister        renal failure  . Diabetes Brother   . Hypertension Brother   . Hyperlipidemia Brother   . Arthritis Brother   . Cervical cancer Sister   . Hypertension Sister   . Liver cancer Sister 6  . Stroke Maternal Grandmother   . Heart disease Maternal Grandfather   . Stroke Paternal Grandmother   .  Heart disease Paternal Grandmother   . Heart disease Paternal Grandfather   . Stroke Paternal Grandfather   . Breast cancer Sister 71  . Hepatitis C Sister 56       Objective:    BP 121/77   Pulse (!) 58   Temp 98.2 F (36.8 C) (Oral)   Resp 16   Ht 5\' 2"  (1.575 m)   Wt 202 lb 9.6 oz (91.9 kg)   SpO2 97%   BMI 37.06 kg/m  Physical Exam  Constitutional: She is oriented to person, place, and time. She appears well-developed and well-nourished. No distress.  HENT:  Head: Normocephalic and atraumatic.  Right Ear: External ear normal.  Left Ear: External ear normal.  Nose: Nose normal.  Mouth/Throat: Oropharynx is clear and moist.  Eyes: Conjunctivae and EOM are normal. Pupils are equal, round, and reactive to light.  Neck: Normal range of motion. Neck supple. Carotid bruit is not present. No thyromegaly present.  Cardiovascular: Normal rate, regular rhythm, normal heart sounds and intact distal pulses.  Exam reveals no gallop and no friction rub.   No murmur heard. Pulmonary/Chest: Effort normal and breath sounds normal. She has no wheezes. She has no rales.  Abdominal: Soft. Bowel sounds are normal. She exhibits no distension and no mass. There is no tenderness. There is no rebound and no guarding.  Lymphadenopathy:    She has no cervical adenopathy.  Neurological: She is alert and oriented to person, place, and time. No cranial nerve deficit.  Skin: Skin is warm and dry. No  rash noted. She is not diaphoretic. No erythema. No pallor.  Psychiatric: She has a normal mood and affect. Her behavior is normal. Judgment and thought content normal.   Results for orders placed or performed in visit on 11/05/16  CBC with Differential/Platelet  Result Value Ref Range   WBC 6.8 3.4 - 10.8 x10E3/uL   RBC 4.15 3.77 - 5.28 x10E6/uL   Hemoglobin 12.4 11.1 - 15.9 g/dL   Hematocrit 39.3 34.0 - 46.6 %   MCV 95 79 - 97 fL   MCH 29.9 26.6 - 33.0 pg   MCHC 31.6 31.5 - 35.7 g/dL   RDW 13.9 12.3 - 15.4 %   Platelets 277 150 - 379 x10E3/uL   Neutrophils 66 Not Estab. %   Lymphs 25 Not Estab. %   Monocytes 7 Not Estab. %   Eos 2 Not Estab. %   Basos 0 Not Estab. %   Neutrophils Absolute 4.5 1.4 - 7.0 x10E3/uL   Lymphocytes Absolute 1.7 0.7 - 3.1 x10E3/uL   Monocytes Absolute 0.4 0.1 - 0.9 x10E3/uL   EOS (ABSOLUTE) 0.1 0.0 - 0.4 x10E3/uL   Basophils Absolute 0.0 0.0 - 0.2 x10E3/uL   Immature Granulocytes 0 Not Estab. %   Immature Grans (Abs) 0.0 0.0 - 0.1 x10E3/uL  Comprehensive metabolic panel  Result Value Ref Range   Glucose 132 (H) 65 - 99 mg/dL   BUN 19 8 - 27 mg/dL   Creatinine, Ser 0.92 0.57 - 1.00 mg/dL   GFR calc non Af Amer 67 >59 mL/min/1.73   GFR calc Af Amer 78 >59 mL/min/1.73   BUN/Creatinine Ratio 21 12 - 28   Sodium 143 134 - 144 mmol/L   Potassium 4.6 3.5 - 5.2 mmol/L   Chloride 101 96 - 106 mmol/L   CO2 24 18 - 29 mmol/L   Calcium 9.6 8.7 - 10.3 mg/dL   Total Protein 7.2 6.0 - 8.5 g/dL   Albumin  4.5 3.6 - 4.8 g/dL   Globulin, Total 2.7 1.5 - 4.5 g/dL   Albumin/Globulin Ratio 1.7 1.2 - 2.2   Bilirubin Total 0.6 0.0 - 1.2 mg/dL   Alkaline Phosphatase 73 39 - 117 IU/L   AST 20 0 - 40 IU/L   ALT 21 0 - 32 IU/L  Hemoglobin A1c  Result Value Ref Range   Hgb A1c MFr Bld 6.0 (H) 4.8 - 5.6 %   Est. average glucose Bld gHb Est-mCnc 126 mg/dL  Lipid panel  Result Value Ref Range   Cholesterol, Total 137 100 - 199 mg/dL   Triglycerides 77 0 - 149 mg/dL     HDL 58 >39 mg/dL   VLDL Cholesterol Cal 15 5 - 40 mg/dL   LDL Calculated 64 0 - 99 mg/dL   Chol/HDL Ratio 2.4 0.0 - 4.4 ratio  TSH  Result Value Ref Range   TSH 0.530 0.450 - 4.500 uIU/mL  T4, free  Result Value Ref Range   Free T4 1.35 0.82 - 1.77 ng/dL       Assessment & Plan:   1. Diabetes mellitus with complication ([facility])   2. Left thyroid nodule   3. Coronary artery disease involving native coronary artery of native heart without angina pectoris   4. Paroxysmal atrial fibrillation (HCC)   5. Old MI (myocardial infarction)   6. Vertebral artery dissection (HCC)   7. Vertebrobasilar occlusive disease   8. Chronic migraine without aura without status migrainosus, not intractable   9. History of cerebellar stroke   10. Hx of laparoscopic gastric banding   11. Dyslipidemia   12. Seasonal allergic rhinitis due to pollen    -stable since last visit; continue current medication. -cardiac monitor detected atrial fibrillation which is likely source of recent CVA; continue anticoagulation.  -thyroid bx concerning for thyroid malignancy; to undergo thyroidectomy in upcoming months.  -obtain labs; will warrant aggressive treatment of lipids and glucose and blood pressure. -acutely worsening allergic rhinitis; rx for Astelin and Prednisone provided  Orders Placed This Encounter  Procedures  . CBC with Differential/Platelet  . Comprehensive metabolic panel    Order Specific Question:   Has the patient fasted?    Answer:   Yes  . Hemoglobin A1c  . Lipid panel    Order Specific Question:   Has the patient fasted?    Answer:   Yes  . TSH  . T4, free   Meds ordered this encounter  Medications  . azelastine (ASTELIN) 0.1 % nasal spray    Sig: Place 2 sprays into both nostrils 2 (two) times daily. Use in each nostril as directed    Dispense:  30 mL    Refill:  12  . predniSONE (DELTASONE) 20 MG tablet    Sig: Two tablets daily x 5 days then one tablet daily x 5 days     Dispense:  15 tablet    Refill:  0    Return in about 3 months (around 02/05/2017) for recheck blood pressure, glucose intolerance, cholesterol.   Jaekwon Mcclune Elayne Guerin, M.D. Primary Care at [facility] [address] [place], [place]  [address] [phone] phone [phone] fax

## 2016-11-06 LAB — CBC WITH DIFFERENTIAL/PLATELET
BASOS ABS: 0 10*3/uL (ref 0.0–0.2)
Basos: 0 %
EOS (ABSOLUTE): 0.1 10*3/uL (ref 0.0–0.4)
EOS: 2 %
Hematocrit: 39.3 % (ref 34.0–46.6)
Hemoglobin: 12.4 g/dL (ref 11.1–15.9)
IMMATURE GRANS (ABS): 0 10*3/uL (ref 0.0–0.1)
IMMATURE GRANULOCYTES: 0 %
LYMPHS: 25 %
Lymphocytes Absolute: 1.7 10*3/uL (ref 0.7–3.1)
MCH: 29.9 pg (ref 26.6–33.0)
MCHC: 31.6 g/dL (ref 31.5–35.7)
MCV: 95 fL (ref 79–97)
MONOS ABS: 0.4 10*3/uL (ref 0.1–0.9)
Monocytes: 7 %
NEUTROS PCT: 66 %
Neutrophils Absolute: 4.5 10*3/uL (ref 1.4–7.0)
PLATELETS: 277 10*3/uL (ref 150–379)
RBC: 4.15 x10E6/uL (ref 3.77–5.28)
RDW: 13.9 % (ref 12.3–15.4)
WBC: 6.8 10*3/uL (ref 3.4–10.8)

## 2016-11-06 LAB — COMPREHENSIVE METABOLIC PANEL
A/G RATIO: 1.7 (ref 1.2–2.2)
ALK PHOS: 73 IU/L (ref 39–117)
ALT: 21 IU/L (ref 0–32)
AST: 20 IU/L (ref 0–40)
Albumin: 4.5 g/dL (ref 3.6–4.8)
BILIRUBIN TOTAL: 0.6 mg/dL (ref 0.0–1.2)
BUN/Creatinine Ratio: 21 (ref 12–28)
BUN: 19 mg/dL (ref 8–27)
CHLORIDE: 101 mmol/L (ref 96–106)
CO2: 24 mmol/L (ref 18–29)
Calcium: 9.6 mg/dL (ref 8.7–10.3)
Creatinine, Ser: 0.92 mg/dL (ref 0.57–1.00)
GFR calc non Af Amer: 67 mL/min/{1.73_m2} (ref >59)
GFR, EST AFRICAN AMERICAN: 78 mL/min/{1.73_m2} (ref >59)
Globulin, Total: 2.7 g/dL (ref 1.5–4.5)
Glucose: 132 mg/dL — ABNORMAL HIGH (ref 65–99)
POTASSIUM: 4.6 mmol/L (ref 3.5–5.2)
Sodium: 143 mmol/L (ref 134–144)
TOTAL PROTEIN: 7.2 g/dL (ref 6.0–8.5)

## 2016-11-06 LAB — LIPID PANEL
CHOLESTEROL TOTAL: 137 mg/dL (ref 100–199)
Chol/HDL Ratio: 2.4 ratio (ref 0.0–4.4)
HDL: 58 mg/dL (ref >39)
LDL Calculated: 64 mg/dL (ref 0–99)
TRIGLYCERIDES: 77 mg/dL (ref 0–149)
VLDL Cholesterol Cal: 15 mg/dL (ref 5–40)

## 2016-11-06 LAB — HEMOGLOBIN A1C
ESTIMATED AVERAGE GLUCOSE: 126 mg/dL
HEMOGLOBIN A1C: 6 % — AB (ref 4.8–5.6)

## 2016-11-06 LAB — TSH: TSH: 0.53 u[IU]/mL (ref 0.450–4.500)

## 2016-11-06 LAB — T4, FREE: FREE T4: 1.35 ng/dL (ref 0.82–1.77)

## 2016-11-21 ENCOUNTER — Ambulatory Visit (INDEPENDENT_AMBULATORY_CARE_PROVIDER_SITE_OTHER): Payer: BLUE CROSS/BLUE SHIELD | Admitting: *Deleted

## 2016-11-21 DIAGNOSIS — I639 Cerebral infarction, unspecified: Secondary | ICD-10-CM

## 2016-11-22 NOTE — Progress Notes (Signed)
Carelink Summary Report / Loop Recorder 

## 2016-11-25 ENCOUNTER — Encounter: Payer: Self-pay | Admitting: Endocrinology

## 2016-11-25 ENCOUNTER — Encounter (HOSPITAL_COMMUNITY): Payer: Self-pay | Admitting: Nurse Practitioner

## 2016-11-25 ENCOUNTER — Ambulatory Visit (INDEPENDENT_AMBULATORY_CARE_PROVIDER_SITE_OTHER): Payer: BLUE CROSS/BLUE SHIELD | Admitting: Endocrinology

## 2016-11-25 ENCOUNTER — Ambulatory Visit (HOSPITAL_COMMUNITY)
Admission: RE | Admit: 2016-11-25 | Discharge: 2016-11-25 | Disposition: A | Discharge status: Home / Self Care | Payer: BLUE CROSS/BLUE SHIELD | Source: Ambulatory Visit | Attending: Nurse Practitioner | Admitting: Nurse Practitioner

## 2016-11-25 ENCOUNTER — Ambulatory Visit: Payer: BLUE CROSS/BLUE SHIELD | Admitting: Endocrinology

## 2016-11-25 VITALS — BP 114/62 | HR 77 | Ht 62.0 in | Wt 206.6 lb

## 2016-11-25 VITALS — BP 146/76 | HR 63 | Ht 62.0 in | Wt 202.6 lb

## 2016-11-25 DIAGNOSIS — I10 Essential (primary) hypertension: Secondary | ICD-10-CM | POA: Insufficient documentation

## 2016-11-25 DIAGNOSIS — Z955 Presence of coronary angioplasty implant and graft: Secondary | ICD-10-CM | POA: Insufficient documentation

## 2016-11-25 DIAGNOSIS — Z9049 Acquired absence of other specified parts of digestive tract: Secondary | ICD-10-CM | POA: Diagnosis not present

## 2016-11-25 DIAGNOSIS — Z800 Family history of malignant neoplasm of digestive organs: Secondary | ICD-10-CM | POA: Insufficient documentation

## 2016-11-25 DIAGNOSIS — Z8261 Family history of arthritis: Secondary | ICD-10-CM | POA: Diagnosis not present

## 2016-11-25 DIAGNOSIS — E785 Hyperlipidemia, unspecified: Secondary | ICD-10-CM | POA: Diagnosis not present

## 2016-11-25 DIAGNOSIS — I2510 Atherosclerotic heart disease of native coronary artery without angina pectoris: Secondary | ICD-10-CM | POA: Diagnosis not present

## 2016-11-25 DIAGNOSIS — Z8049 Family history of malignant neoplasm of other genital organs: Secondary | ICD-10-CM | POA: Insufficient documentation

## 2016-11-25 DIAGNOSIS — Z803 Family history of malignant neoplasm of breast: Secondary | ICD-10-CM | POA: Insufficient documentation

## 2016-11-25 DIAGNOSIS — Z7902 Long term (current) use of antithrombotics/antiplatelets: Secondary | ICD-10-CM | POA: Diagnosis not present

## 2016-11-25 DIAGNOSIS — E041 Nontoxic single thyroid nodule: Secondary | ICD-10-CM

## 2016-11-25 DIAGNOSIS — Z8619 Personal history of other infectious and parasitic diseases: Secondary | ICD-10-CM | POA: Diagnosis not present

## 2016-11-25 DIAGNOSIS — Z882 Allergy status to sulfonamides status: Secondary | ICD-10-CM | POA: Insufficient documentation

## 2016-11-25 DIAGNOSIS — E119 Type 2 diabetes mellitus without complications: Secondary | ICD-10-CM | POA: Insufficient documentation

## 2016-11-25 DIAGNOSIS — Z8249 Family history of ischemic heart disease and other diseases of the circulatory system: Secondary | ICD-10-CM | POA: Diagnosis not present

## 2016-11-25 DIAGNOSIS — Z888 Allergy status to other drugs, medicaments and biological substances status: Secondary | ICD-10-CM | POA: Diagnosis not present

## 2016-11-25 DIAGNOSIS — Z833 Family history of diabetes mellitus: Secondary | ICD-10-CM | POA: Insufficient documentation

## 2016-11-25 DIAGNOSIS — Z9889 Other specified postprocedural states: Secondary | ICD-10-CM | POA: Diagnosis not present

## 2016-11-25 DIAGNOSIS — Z841 Family history of disorders of kidney and ureter: Secondary | ICD-10-CM | POA: Insufficient documentation

## 2016-11-25 DIAGNOSIS — I219 Acute myocardial infarction, unspecified: Secondary | ICD-10-CM | POA: Diagnosis not present

## 2016-11-25 DIAGNOSIS — Z820 Family history of epilepsy and other diseases of the nervous system: Secondary | ICD-10-CM | POA: Insufficient documentation

## 2016-11-25 DIAGNOSIS — I480 Paroxysmal atrial fibrillation: Secondary | ICD-10-CM | POA: Diagnosis not present

## 2016-11-25 DIAGNOSIS — R011 Cardiac murmur, unspecified: Secondary | ICD-10-CM | POA: Insufficient documentation

## 2016-11-25 DIAGNOSIS — Z823 Family history of stroke: Secondary | ICD-10-CM | POA: Diagnosis not present

## 2016-11-25 DIAGNOSIS — Z87891 Personal history of nicotine dependence: Secondary | ICD-10-CM | POA: Diagnosis not present

## 2016-11-25 DIAGNOSIS — Z85828 Personal history of other malignant neoplasm of skin: Secondary | ICD-10-CM | POA: Insufficient documentation

## 2016-11-25 DIAGNOSIS — Z9884 Bariatric surgery status: Secondary | ICD-10-CM | POA: Insufficient documentation

## 2016-11-25 LAB — CBC
HCT: 36.7 % (ref 36.0–46.0)
HEMOGLOBIN: 11.8 g/dL — AB (ref 12.0–15.0)
MCH: 30.3 pg (ref 26.0–34.0)
MCHC: 32.2 g/dL (ref 30.0–36.0)
MCV: 94.1 fL (ref 78.0–100.0)
Platelets: 245 10*3/uL (ref 150–400)
RBC: 3.9 MIL/uL (ref 3.87–5.11)
RDW: 13.6 % (ref 11.5–15.5)
WBC: 7.7 10*3/uL (ref 4.0–10.5)

## 2016-11-25 LAB — CUP PACEART REMOTE DEVICE CHECK
Date Time Interrogation Session: 20180531203611
MDC IDC PG IMPLANT DT: 20180302

## 2016-11-25 NOTE — Progress Notes (Signed)
Primary Care Physician: [name], MD Referring Physician: Dr. Caryl Comes Cardiologist:Dr. Marlou Porch   Elaine Jackson is a 61 y.o. female with a h/o CAD, recent stroke in 07/2016 that is here today for Afib showing up on Linq, implanted at time of stroke. She had afib off and on all day on the 19th. She was mostly aware of some pressure in her neck and fatigue. She is currently on asa/plavix for her stroke. She denies any bleeding history. Chadsvasc score is 6. She has recently found out that she will need thyroid surgery for suspicious  thyroid nodule, has not been scheduled yet.  F/u in afib clinic for one month on Doac's. She noted afib one time while out of town in [place], [place] but responded well to prn diltiazem. She is still pending thyroid surgery.  Today, she denies symptoms of palpitations, chest pain, shortness of breath, orthopnea, PND, lower extremity edema, dizziness, presyncope, syncope, or neurologic sequela. The patient is tolerating medications without difficulties and is otherwise without complaint today.   Past Medical History:  Diagnosis Date  . Allergic rhinitis, cause unspecified   . Allergy   . CAD (coronary artery disease)    LHC 12/20/2014 showed small to moderate apical infarction due to distal LAD stenosis with reestablished TIMI2 flow. No PCI. Recommended plavix for 1 yr and ASA for life.   . Chicken pox   . Diabetes mellitus   . Family history of breast cancer in first degree relative    sister age 8  . Heart murmur   . Hyperlipidemia   . Hypertension   . Malignant neoplasm skin of face 02/16/2007   Basal Cell Carcinoma  Tafeen.  . Measles   . Obesity, unspecified   . Other chronic nonalcoholic liver disease   . Sleep related leg cramps   . Thyroid nodule    10-2016   . Unspecified vitamin D deficiency   . Vision abnormalities    Past Surgical History:  Procedure Laterality Date  . CARDIAC CATHETERIZATION N/A 12/20/2014   Procedure: Left Heart Cath  and Coronary Angiography;  Surgeon: Sanda Klein, MD;  Location: [facility];  Service: Cardiovascular;  Laterality: N/A;  . CHOLECYSTECTOMY    . COLONOSCOPY  06/25/2007   Hung. Normal. Repeat in ten years.  Marland Kitchen LAPAROSCOPIC GASTRIC BANDING  07/03/10   Dr. Sherrin Daisy; [facility]  . LOOP RECORDER INSERTION N/A 08/23/2016   Procedure: Loop Recorder Insertion;  Surgeon: Deboraha Sprang, MD;  Location: [facility];  Service: Cardiovascular;  Laterality: N/A;  . TONSILLECTOMY      Current Outpatient Prescriptions  Medication Sig Dispense Refill  . apixaban (ELIQUIS) 5 MG TABS tablet Take 1 tablet (5 mg total) by mouth 2 (two) times daily. 60 tablet 3  . atorvastatin (LIPITOR) 80 MG tablet TAKE 1 TABLET (80 MG TOTAL) BY MOUTH DAILY AT 6 PM. 30 tablet 0  . azelastine (ASTELIN) 0.1 % nasal spray Place 2 sprays into both nostrils 2 (two) times daily. Use in each nostril as directed 30 mL 12  . cholecalciferol (VITAMIN D) 1000 units tablet Take 1 tablet (1,000 Units total) by mouth daily.    . Cyanocobalamin (VITAMIN B-12 PO) Take 1 tablet by mouth daily.    Marland Kitchen diltiazem (CARDIZEM) 30 MG tablet Take 1 tablet every 4 hours AS NEEDED for rapid afib heart rate >100 45 tablet 2  . fluticasone (FLONASE) 50 MCG/ACT nasal spray Place 2 sprays into both nostrils daily as  needed for allergies.   11  . montelukast (SINGULAIR) 10 MG tablet TAKE 1 TABLET (10 MG TOTAL) BY MOUTH AT BEDTIME. 90 tablet 3  . triamcinolone cream (KENALOG) 0.1 % Apply 1 application topically 2 (two) times daily. 45 g 0   No current facility-administered medications for this encounter.     Allergies  Allergen Reactions  . Sulfa Antibiotics Nausea And Vomiting  . Zofran [Ondansetron Hcl] Other (See Comments)    Oral numbness with IV dosing.     Social History   Social History  . Marital status: Married    Spouse name: N/A  . Number of children: 1  . Years of education: N/A   Occupational History  . HAIR STYLIST  [employer]   Social History Main Topics  . Smoking status: Former Smoker    Packs/day: 1.00    Years: 25.00    Types: Cigarettes  . Smokeless tobacco: Never Used     Comment: quit age 83     smoked from 68 to 45 off and on  . Alcohol use No  . Drug use: No  . Sexual activity: Yes    Birth control/ protection: Post-menopausal   Other Topics Concern  . Not on file   Social History Narrative   Marital status:  Married; x 26 years. Second marriage. Happily married; no abuse      Children: one child, one stepson;two grandsons      Lives: with husband, mother in 2017      Employment:  [employer] part-time 25-30 hours per week; current job x 19 years.      Tobacco:  None      Alcohol: rare/minimal.      Drugs:  None       Exercise: none in 2017       Seatbelt:  100%.       Sunscreen: SPF 15.      Guns:  Maybe?    Family History  Problem Relation Age of Onset  . Diabetes Mother   . Heart disease Mother        defibrillator; carotid artery stenosis.  . Hypertension Mother   . Hyperlipidemia Mother   . Dementia Mother   . Congestive Heart Failure Mother   . Heart disease Father   . Kidney failure Father        ESRD/peritoneal dialysis  . Diabetes Father   . Hypertension Father   . Hyperlipidemia Father   . Stroke Father   . Kidney disease Father   . Congestive Heart Failure Father   . Diabetes Sister   . Hypertension Sister   . Kidney disease Sister        renal failure  . Diabetes Brother   . Hypertension Brother   . Hyperlipidemia Brother   . Arthritis Brother   . Cervical cancer Sister   . Hypertension Sister   . Liver cancer Sister 33  . Stroke Maternal Grandmother   . Heart disease Maternal Grandfather   . Stroke Paternal Grandmother   . Heart disease Paternal Grandmother   . Heart disease Paternal Grandfather   . Stroke Paternal Grandfather   . Breast cancer Sister 25  . Hepatitis C Sister 90  . Thyroid disease Neg Hx     ROS- All systems are  reviewed and negative except as per the HPI above  Physical Exam: Vitals:   11/25/16 1335  BP: 114/62  Pulse: 77  Weight: 206 lb 9.6 oz (93.7 kg)  Height: 5'  2" (1.575 m)   Wt Readings from Last 3 Encounters:  11/25/16 206 lb 9.6 oz (93.7 kg)  11/25/16 202 lb 9.6 oz (91.9 kg)  11/05/16 202 lb 9.6 oz (91.9 kg)    Labs: Lab Results  Component Value Date   NA 143 11/05/2016   K 4.6 11/05/2016   CL 101 11/05/2016   CO2 24 11/05/2016   GLUCOSE 132 (H) 11/05/2016   BUN 19 11/05/2016   CREATININE 0.92 11/05/2016   CALCIUM 9.6 11/05/2016   Lab Results  Component Value Date   INR 0.97 08/20/2016   Lab Results  Component Value Date   CHOL 137 11/05/2016   HDL 58 11/05/2016   LDLCALC 64 11/05/2016   TRIG 77 11/05/2016     GEN- The patient is well appearing, alert and oriented x 3 today.   Head- normocephalic, atraumatic Eyes-  Sclera clear, conjunctiva pink Ears- hearing intact Oropharynx- clear Neck- supple, no JVP Lymph- no cervical lymphadenopathy Lungs- Clear to ausculation bilaterally, normal work of breathing Heart- Regular rate and rhythm, no murmurs, rubs or gallops, PMI not laterally displaced GI- soft, NT, ND, + BS Extremities- no clubbing, cyanosis, or edema MS- no significant deformity or atrophy Skin- no rash or lesion Psych- euthymic mood, full affect Neuro- strength and sensation are intact  EKG-Sinus rhythm at 77 bpm, pr int 128 ms, qrs int 84 ms, qtc 439 ms LinQ strips reviewed.                         Echo-LV EF: 60% -   65%  ------------------------------------------------------------------- Indications:      CVA 436.  ------------------------------------------------------------------- History:   PMH:   Murmur.  Coronary artery disease.  PMH: Myocardial infarction.  Risk factors:  Former tobacco use. Hypertension. Diabetes mellitus. Dyslipidemia.  ------------------------------------------------------------------- Study  Conclusions  - Left ventricle: The cavity size was normal. Wall thickness was   normal. Systolic function was normal. The estimated ejection   fraction was in the range of 60% to 65%. Wall motion was normal;   there were no regional wall motion abnormalities. Doppler   parameters are consistent with abnormal left ventricular   relaxation (grade 1 diastolic dysfunction). - Aortic valve: There was trivial regurgitation.  Impressions:  - No cardiac source of emboli was indentified.    Assessment and Plan: 1. Paroxysmal afib Recorded on linq, s/p stroke General education re afib discussed Off  asa and plavix Continue eliquis 5 mg bid for chadsvasc score of 6 Bleeding precautions discussed 30 mg cardizem if needed for rapid heart beat   afib clinic as needed  Butch Penny C. Ladasha Schnackenberg, [facility] [address] [place], [place] [address] [phone]

## 2016-11-25 NOTE — Progress Notes (Signed)
Subjective:    Patient ID: Elaine Jackson, female    DOB: 27-Nov-1955, 61 y.o.   MRN: [id number]  HPI Pt is referred by Dr Tamala Julian, for nodular thyroid.  On carotid US, pt was incidentally noted to have a nodule at left thyroid area  No assoc pain.  She has slight cold intolerance.  she is unaware of ever having had thyroid problems in the past.  she has no h/o XRT or surgery to the neck.  She has seen Dr Excell Seltzer, and is awaiting ins approval for surgery.   Past Medical History:  Diagnosis Date  . Allergic rhinitis, cause unspecified   . Allergy   . CAD (coronary artery disease)    LHC 12/20/2014 showed small to moderate apical infarction due to distal LAD stenosis with reestablished TIMI2 flow. No PCI. Recommended plavix for 1 yr and ASA for life.   . Chicken pox   . Diabetes mellitus   . Family history of breast cancer in first degree relative    sister age 21  . Heart murmur   . Hyperlipidemia   . Hypertension   . Malignant neoplasm skin of face 02/16/2007   Basal Cell Carcinoma  [place].  . Measles   . Obesity, unspecified   . Other chronic nonalcoholic liver disease   . Sleep related leg cramps   . Thyroid nodule    10-2016   . Unspecified vitamin D deficiency   . Vision abnormalities     Past Surgical History:  Procedure Laterality Date  . CARDIAC CATHETERIZATION N/A 12/20/2014   Procedure: Left Heart Cath and Coronary Angiography;  Surgeon: Sanda Klein, MD;  Location: [facility];  Service: Cardiovascular;  Laterality: N/A;  . CHOLECYSTECTOMY    . COLONOSCOPY  06/25/2007   [place]. Normal. Repeat in ten years.  Marland Kitchen LAPAROSCOPIC GASTRIC BANDING  07/03/10   Dr. Sherrin Daisy; [facility]  . LOOP RECORDER INSERTION N/A 08/23/2016   Procedure: Loop Recorder Insertion;  Surgeon: Deboraha Sprang, MD;  Location: [facility];  Service: Cardiovascular;  Laterality: N/A;  . TONSILLECTOMY      Social History   Social History  . Marital status: Married    Spouse name: N/A    . Number of children: 1  . Years of education: N/A   Occupational History  . 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 Smoking status: Former Smoker    Packs/day: 1.00    Years: 25.00    Types: Cigarettes  . Smokeless tobacco: Never Used     Comment: quit age 3     smoked from 73 to 60 off and on  . Alcohol use No  . Drug use: No  . Sexual activity: Yes    Birth control/ protection: Post-menopausal   Other Topics Concern  . Not on file   Social History Narrative   Marital status:  Married; x 26 years. Second marriage. Happily married; no abuse      Children: one child, one stepson;two grandsons      Lives: with husband, mother in 2017      Employment:  [employer] part-time 25-30 hours per week; current job x 19 years.      Tobacco:  None      Alcohol: rare/minimal.      Drugs:  None       Exercise: none in 2017       Seatbelt:  100%.       Sunscreen:  SPF 15.      Guns:  Maybe?    Current Outpatient Prescriptions on File Prior to Visit  Medication Sig Dispense Refill  . apixaban (ELIQUIS) 5 MG TABS tablet Take 1 tablet (5 mg total) by mouth 2 (two) times daily. 60 tablet 3  . atorvastatin (LIPITOR) 80 MG tablet TAKE 1 TABLET (80 MG TOTAL) BY MOUTH DAILY AT 6 PM. 30 tablet 0  . azelastine (ASTELIN) 0.1 % nasal spray Place 2 sprays into both nostrils 2 (two) times daily. Use in each nostril as directed 30 mL 12  . cholecalciferol (VITAMIN D) 1000 units tablet Take 1 tablet (1,000 Units total) by mouth daily.    . Cyanocobalamin (VITAMIN B-12 PO) Take 1 tablet by mouth daily.    Marland Kitchen diltiazem (CARDIZEM) 30 MG tablet Take 1 tablet every 4 hours AS NEEDED for rapid afib heart rate >100 45 tablet 2  . fluticasone (FLONASE) 50 MCG/ACT nasal spray Place 2 sprays into both nostrils daily as needed for allergies.   11  . montelukast (SINGULAIR) 10 MG tablet TAKE 1 TABLET (10 MG TOTAL) BY MOUTH AT BEDTIME. 90 tablet 3  . triamcinolone cream (KENALOG) 0.1 % Apply 1  application topically 2 (two) times daily. 45 g 0   No current facility-administered medications on file prior to visit.     Allergies  Allergen Reactions  . Sulfa Antibiotics Nausea And Vomiting  . Zofran [Ondansetron Hcl] Other (See Comments)    Oral numbness with IV dosing.     Family History  Problem Relation Age of Onset  . Diabetes Mother   . Heart disease Mother        defibrillator; carotid artery stenosis.  . Hypertension Mother   . Hyperlipidemia Mother   . Dementia Mother   . Congestive Heart Failure Mother   . Heart disease Father   . Kidney failure Father        ESRD/peritoneal dialysis  . Diabetes Father   . Hypertension Father   . Hyperlipidemia Father   . Stroke Father   . Kidney disease Father   . Congestive Heart Failure Father   . Diabetes Sister   . Hypertension Sister   . Kidney disease Sister        renal failure  . Diabetes Brother   . Hypertension Brother   . Hyperlipidemia Brother   . Arthritis Brother   . Cervical cancer Sister   . Hypertension Sister   . Liver cancer Sister 45  . Stroke Maternal Grandmother   . Heart disease Maternal Grandfather   . Stroke Paternal Grandmother   . Heart disease Paternal Grandmother   . Heart disease Paternal Grandfather   . Stroke Paternal Grandfather   . Breast cancer Sister 74  . Hepatitis C Sister 13  . Thyroid disease Neg Hx     BP (!) 146/76 (BP Location: Left Arm, Patient Position: Sitting, Cuff Size: Normal)   Pulse 63   Ht 5\' 2"  (1.575 m)   Wt 202 lb 9.6 oz (91.9 kg)   SpO2 98%   BMI 37.06 kg/m   Review of Systems Denies weight change, hoarseness, visual loss, chest pain, sob, cough, dysphagia, diarrhea, itching, flushing, depression, headache, and numbness.  She has rhinorrhea, and easy bruising.      Objective:   Physical Exam VS: see vs page GEN: no distress HEAD: head: no deformity eyes: no periorbital swelling, no proptosis external nose and ears are normal mouth: no lesion  seen NECK: the  left thyroid nodule is easily palpable, and freely mobile.  CHEST WALL: no deformity LUNGS: clear to auscultation CV: reg rate and rhythm, no murmur.   ABD: abdomen is soft, nontender.  no hepatosplenomegaly.  not distended.  no hernia MUSCULOSKELETAL: muscle bulk and strength are grossly normal.  no obvious joint swelling.  gait is normal and steady EXTEMITIES: no deformity.  no ulcer on the feet.  feet are of normal color and temp.  no edema PULSES: dorsalis pedis intact bilat.  no carotid bruit NEURO:  cn 2-12 grossly intact.   readily moves all 4's.  sensation is intact to touch on the feet SKIN:  Normal texture and temperature.  No rash or suspicious lesion is visible.   NODES:  None palpable at the neck PSYCH: alert, well-oriented.  Does not appear anxious nor depressed.     thyroid US: 3.5 cm left lobe nodule.    bx cytol: FOLLICULAR NEOPLASM OR SUSPICIOUS FOR A FOLLICULAR NEOPLASM (B[facility]DA CATEGORY IV).  I have reviewed outside records, and summarized:  Pt was noted to have suspicious cytology, and referred here.  She saw surgeon, who recommended thyroidectomy.    Lab Results  Component Value Date   TSH 0.530 11/05/2016   T3TOTAL 90 08/21/2016   Lab Results  Component Value Date   CALCIUM 9.6 11/05/2016   CAION 1.19 08/20/2016      Assessment & Plan:  Thyroid nodule, new.  High risk for malignancy.  We discussed new staging.  I agree with plan for thyroidectomy.   Patient Instructions  Please come back approx 1 week after you get out of the hospital.  When you leave the hospital, please do not take thyroid medication then.

## 2016-11-25 NOTE — Patient Instructions (Signed)
Please come back approx 1 week after you get out of the hospital.  When you leave the hospital, please do not take thyroid medication then.

## 2016-11-30 ENCOUNTER — Ambulatory Visit (INDEPENDENT_AMBULATORY_CARE_PROVIDER_SITE_OTHER): Payer: BLUE CROSS/BLUE SHIELD | Admitting: Physician Assistant

## 2016-11-30 ENCOUNTER — Encounter: Payer: Self-pay | Admitting: Physician Assistant

## 2016-11-30 VITALS — BP 148/81 | HR 69 | Temp 98.6°F | Resp 16 | Ht 61.5 in | Wt 203.4 lb

## 2016-11-30 DIAGNOSIS — J329 Chronic sinusitis, unspecified: Secondary | ICD-10-CM

## 2016-11-30 MED ORDER — AMOXICILLIN-POT CLAVULANATE 875-125 MG PO TABS: 1.0000 | ORAL_TABLET | Freq: Two times a day (BID) | ORAL | 0 refills | Status: AC

## 2016-11-30 MED ORDER — MUCINEX DM MAXIMUM STRENGTH 60-1200 MG PO TB12: 1.0000 | ORAL_TABLET | Freq: Two times a day (BID) | ORAL | 0 refills | Status: DC

## 2016-11-30 NOTE — Patient Instructions (Addendum)
Continue taking your allergy medications as you are. Use the Astelin nasal spray first and then use the Flonase spray.  Drink lots of fluids and make sure you get plenty of rest!   IF you received an x-ray today, you will receive an invoice from [facility]. Please contact [facility] at [phone] with questions or concerns regarding your invoice.   IF you received labwork today, you will receive an invoice from [facility]. Please contact [facility] at [phone] with questions or concerns regarding your invoice.   Our billing staff will not be able to assist you with questions regarding bills from these companies.  You will be contacted with the lab results as soon as they are available. The fastest way to get your results is to activate your My Chart account. Instructions are located on the last page of this paperwork. If you have not heard from [place] regarding the results in 2 weeks, please contact this office.      Sinusitis, Adult Sinusitis is soreness and inflammation of your sinuses. Sinuses are hollow spaces in the bones around your face. They are located:  Around your eyes.  In the middle of your forehead.  Behind your nose.  In your cheekbones.  Your sinuses and nasal passages are lined with a stringy fluid (mucus). Mucus normally drains out of your sinuses. When your nasal tissues get inflamed or swollen, the mucus can get trapped or blocked so air cannot flow through your sinuses. This lets bacteria, viruses, and funguses grow, and that leads to infection. Follow these instructions at home: Medicines  Take, use, or apply over-the-counter and prescription medicines only as told by your doctor. These may include nasal sprays.  If you were prescribed an antibiotic medicine, take it as told by your doctor. Do not stop taking the antibiotic even if you start to feel better. Hydrate and Humidify  Drink enough water to keep your pee (urine) clear or pale  yellow.  Use a cool mist humidifier to keep the humidity level in your home above 50%.  Breathe in steam for 10-15 minutes, 3-4 times a day or as told by your doctor. You can do this in the bathroom while a hot shower is running.  Try not to spend time in cool or dry air. Rest  Rest as much as possible.  Sleep with your head raised (elevated).  Make sure to get enough sleep each night. General instructions  Put a warm, moist washcloth on your face 3-4 times a day or as told by your doctor. This will help with discomfort.  Wash your hands often with soap and water. If there is no soap and water, use hand sanitizer.  Do not smoke. Avoid being around people who are smoking (secondhand smoke).  Keep all follow-up visits as told by your doctor. This is important. Contact a doctor if:  You have a fever.  Your symptoms get worse.  Your symptoms do not get better within 10 days. Get help right away if:  You have a very bad headache.  You cannot stop throwing up (vomiting).  You have pain or swelling around your face or eyes.  You have trouble seeing.  You feel confused.  Your neck is stiff.  You have trouble breathing. This information is not intended to replace advice given to you by your health care provider. Make sure you discuss any questions you have with your health care provider. Document Released: 11/27/2007 Document Revised: 02/04/2016 Document Reviewed: 04/05/2015 Elsevier Interactive Patient  Education  2018 [employer]vier Inc.  

## 2016-11-30 NOTE — Progress Notes (Signed)
Subjective:    Patient ID: Elaine Jackson, female    DOB: 03/27/1956, 61 y.o.   MRN: [id number]  HPI: 61 y/o F patient presents complaining of nasal congestion, cough, rhinorrhea, postnasal drip and occasional cough that has been occurring for about a month now. She was seen here in the office on 5/15, and was prescribed Astelin nasal spray and prednisone. Her symproms resolved but as soon as she stopped taking the Prednisone, the symptoms returned. She is currently taking Claritin, Singulair, Astelin without relief. She was using Flonase spray but has stopped. She is also taking Tylenol to help with the pain of the sore throat. Cough is intermittently productive, but when it is, it's " thick, and milky to clear in color". She is not sleeping well because the cough is keeping her up at night. She admits to fatigue, and malaise in addition to her URI symptoms. She has a subjective fever. She denies chest pain, SOB, pleuritic chest pain, nausea, vomiting, diarrhea, and body aches. She is maintaining good PO intake.   Patient Active Problem List   Diagnosis Date Noted  . Paroxysmal atrial fibrillation ([place]) 10/14/2016  . History of cerebellar stroke 09/16/2016  . Leukocytosis   . Cerebellar stroke ([place]) 08/23/2016  . Neurologic gait disorder   . History of migraine   . Slow transit constipation   . Acute CVA (cerebrovascular accident) ([place]) 08/21/2016  . Vertebral artery dissection ([place]) 08/20/2016  . Acute kidney injury ([place]) 08/20/2016  . Left thyroid nodule 08/20/2016  . Bradycardia 08/20/2016  . Abnormal urinalysis 08/20/2016  . Diabetes mellitus with complication ([place])   . Vertebrobasilar occlusive disease   . Seasonal allergic rhinitis due to pollen 05/20/2016  . Secondary pulmonary hypertension 08/28/2015  . Old MI (myocardial infarction) 08/28/2015  . Ischemic heart disease 08/28/2015  . Coronary artery disease involving native coronary artery of native heart without angina pectoris  05/15/2015  . Migraine 12/21/2014  . Vitamin D deficiency 01/20/2013  . Nail dystrophy 09/14/2012  . Essential hypertension, benign 04/16/2012  . Dyslipidemia 04/16/2012  . Class 2 obesity due to excess calories with serious comorbidity and body mass index (BMI) of 36.0 to 36.9 in adult 04/16/2012  . Hx of laparoscopic gastric banding 02/13/2011   Prior to Admission medications   Medication Sig Start Date End Date Taking? Authorizing Provider  apixaban (ELIQUIS) 5 MG TABS tablet Take 1 tablet (5 mg total) by mouth 2 (two) times daily. 10/23/16  Yes Sherran Needs, NP  atorvastatin (LIPITOR) 80 MG tablet TAKE 1 TABLET (80 MG TOTAL) BY MOUTH DAILY AT 6 PM. 08/26/16  Yes Love, Ivan Anchors, PA-C  azelastine (ASTELIN) 0.1 % nasal spray Place 2 sprays into both nostrils 2 (two) times daily. Use in each nostril as directed 11/05/16  Yes [name], MD  cholecalciferol (VITAMIN D) 1000 units tablet Take 1 tablet (1,000 Units total) by mouth daily. 08/26/16  Yes Love, Ivan Anchors, PA-C  Cyanocobalamin (VITAMIN B-12 PO) Take 1 tablet by mouth daily.   Yes [provider]  diltiazem (CARDIZEM) 30 MG tablet Take 1 tablet every 4 hours AS NEEDED for rapid afib heart rate >100 10/23/16  Yes Sherran Needs, NP  fluticasone (FLONASE) 50 MCG/ACT nasal spray Place 2 sprays into both nostrils daily as needed for allergies.  05/27/15  Yes [provider]  montelukast (SINGULAIR) 10 MG tablet TAKE 1 TABLET (10 MG TOTAL) BY MOUTH AT BEDTIME. 05/01/16  Yes [name], MD  triamcinolone cream (KENALOG) 0.1 % Apply 1 application topically 2 (two) times daily. 08/28/16  Yes [name], MD  amoxicillin-clavulanate (AUGMENTIN) 875-125 MG tablet Take 1 tablet by mouth 2 (two) times daily. 11/30/16 12/10/16  Harrison Mons, PA-C  Dextromethorphan-Guaifenesin (MUCINEX DM MAXIMUM STRENGTH) 60-1200 MG TB12 Take 1 tablet by mouth every 12 (twelve) hours. 11/30/16   Harrison Mons, PA-C   Allergies  Allergen  Reactions  . Sulfa Antibiotics Nausea And Vomiting  . Zofran [Ondansetron Hcl] Other (See Comments)    Oral numbness with IV dosing.      Review of Systems  Constitutional: Positive for fatigue. Negative for chills and fever.  HENT: Positive for postnasal drip, rhinorrhea, sinus pressure and sore throat.   Eyes: Negative.   Respiratory: Positive for cough and shortness of breath. Negative for chest tightness and wheezing.   Cardiovascular: Negative.  Negative for chest pain.  Gastrointestinal: Negative.  Negative for abdominal pain, diarrhea, nausea and vomiting.  Endocrine: Negative.   Genitourinary: Negative.   Musculoskeletal: Negative.   Skin: Negative.   Allergic/Immunologic: Negative.   Neurological: Negative.  Negative for dizziness, light-headedness and headaches.  Hematological: Negative.   Psychiatric/Behavioral: Positive for sleep disturbance (cough is keeping her awake).       Objective:   Physical Exam  Constitutional: She is oriented to person, place, and time. She appears well-developed and well-nourished. No distress.  BP (!) 148/81   Pulse 69   Temp 98.6 F (37 C) (Oral)   Resp 16   Ht 5' 1.5" (1.562 m)   Wt 203 lb 6 oz (92.3 kg)   SpO2 98%   BMI 37.81 kg/m    HENT:  Head: Normocephalic and atraumatic.  Right Ear: External ear normal.  Left Ear: External ear normal.  Nose: Nose normal.  Mouth/Throat: Oropharynx is clear and moist. No oropharyngeal exudate (erythema and petechea noted).  Eyes: Conjunctivae and EOM are normal. Pupils are equal, round, and reactive to light.  Neck: Normal range of motion. Neck supple. No thyromegaly present.  Cardiovascular: Normal rate and regular rhythm.   Murmur heard. Pulmonary/Chest: Effort normal and breath sounds normal.  Abdominal: Soft. There is no tenderness.  Lymphadenopathy:    She has no cervical adenopathy.  Neurological: She is alert and oriented to person, place, and time.  Skin: Skin is warm and  dry. She is not diaphoretic.  Psychiatric: She has a normal mood and affect. Her behavior is normal. Judgment and thought content normal.       Assessment & Plan:  1. Chronic sinusitis, unspecified location Continue using the Claritin, singular and Astelin nasal spray. Start using the flonase 1 time a day about 5-10 minutes after using Astelin.  - amoxicillin-clavulanate (AUGMENTIN) 875-125 MG tablet; Take 1 tablet by mouth 2 (two) times daily.  Dispense: 20 tablet; Refill: 0 - Dextromethorphan-Guaifenesin (MUCINEX DM MAXIMUM STRENGTH) 60-1200 MG TB12; Take 1 tablet by mouth every 12 (twelve) hours.  Dispense: 14 each; Refill: 0  2. Cough Continues to be intermittently productive. Relieved with prednisone but came back once it was discontinued. -Mucinex  Return if symptoms worsen or fail to improve.

## 2016-11-30 NOTE — Progress Notes (Signed)
Patient ID: Elaine Jackson, female    DOB: [birth year], 61 y.o.   MRN: [id number]  PCP: [name], MD  Chief Complaint  Patient presents with  . Cough  . Sinus Problem    Subjective:   Presents for evaluation of cough, sinus congestion x 4 weeks.  She was seen here on 11/05/2016 for same and prescribed Astelin NS and oral prednisone. Her symptoms resolved while on this treatment, but recurred as soon as she completed the steroid taper.  Using loratadine, montelukast, Astelin, but stopped Flonase. Acetaminophen helps her sore throat and facial pressure/pain.  Cough is sometimes productive, thick, white-clear, and keeps her awake at night. Fatigue, subjective fever. No dizziness, nausea, vomiting.    Review of Systems Constitutional: Positive for fatigue. Negative for chills and fever.  HENT: Positive for postnasal drip, rhinorrhea, sinus pressure and sore throat.   Eyes: Negative.   Respiratory: Positive for cough and shortness of breath. Negative for chest tightness and wheezing.   Cardiovascular: Negative.  Negative for chest pain.  Gastrointestinal: Negative.  Negative for abdominal pain, diarrhea, nausea and vomiting.  Endocrine: Negative.   Genitourinary: Negative.   Musculoskeletal: Negative.   Skin: Negative.   Allergic/Immunologic: Negative.   Neurological: Negative.  Negative for dizziness, light-headedness and headaches.  Hematological: Negative.   Psychiatric/Behavioral: Positive for sleep disturbance (cough is keeping her awake).     Patient Active Problem List   Diagnosis Date Noted  . Paroxysmal atrial fibrillation ([place]) 10/14/2016  . History of cerebellar stroke 09/16/2016  . Leukocytosis   . Cerebellar stroke ([place]) 08/23/2016  . Neurologic gait disorder   . History of migraine   . Slow transit constipation   . Acute CVA (cerebrovascular accident) ([place]) 08/21/2016  . Vertebral artery dissection ([place]) 08/20/2016  . Acute kidney injury ([place])  08/20/2016  . Left thyroid nodule 08/20/2016  . Bradycardia 08/20/2016  . Abnormal urinalysis 08/20/2016  . Diabetes mellitus with complication ([place])   . Vertebrobasilar occlusive disease   . Seasonal allergic rhinitis due to pollen 05/20/2016  . Secondary pulmonary hypertension 08/28/2015  . Old MI (myocardial infarction) 08/28/2015  . Ischemic heart disease 08/28/2015  . Coronary artery disease involving native coronary artery of native heart without angina pectoris 05/15/2015  . Migraine 12/21/2014  . Vitamin D deficiency 01/20/2013  . Nail dystrophy 09/14/2012  . Essential hypertension, benign 04/16/2012  . Dyslipidemia 04/16/2012  . Class 2 obesity due to excess calories with serious comorbidity and body mass index (BMI) of 36.0 to 36.9 in adult 04/16/2012  . Hx of laparoscopic gastric banding 02/13/2011     Prior to Admission medications   Medication Sig Start Date End Date Taking? Authorizing Provider  apixaban (ELIQUIS) 5 MG TABS tablet Take 1 tablet (5 mg total) by mouth 2 (two) times daily. 10/23/16  Yes Sherran Needs, NP  atorvastatin (LIPITOR) 80 MG tablet TAKE 1 TABLET (80 MG TOTAL) BY MOUTH DAILY AT 6 PM. 08/26/16  Yes Love, Ivan Anchors, PA-C  azelastine (ASTELIN) 0.1 % nasal spray Place 2 sprays into both nostrils 2 (two) times daily. Use in each nostril as directed 11/05/16  Yes [name], MD  cholecalciferol (VITAMIN D) 1000 units tablet Take 1 tablet (1,000 Units total) by mouth daily. 08/26/16  Yes Love, Ivan Anchors, PA-C  Cyanocobalamin (VITAMIN B-12 PO) Take 1 tablet by mouth daily.   Yes [provider]  diltiazem (CARDIZEM) 30 MG tablet Take 1 tablet every 4 hours AS NEEDED  for rapid afib heart rate >100 10/23/16  Yes Sherran Needs, NP  fluticasone [facility]) 50 MCG/ACT nasal spray Place 2 sprays into both nostrils daily as needed for allergies.  05/27/15  Yes [provider]  montelukast (SINGULAIR) 10 MG tablet TAKE 1 TABLET (10 MG TOTAL) BY MOUTH  AT BEDTIME. 05/01/16  Yes [name], MD  triamcinolone cream (KENALOG) 0.1 % Apply 1 application topically 2 (two) times daily. 08/28/16  Yes [name], MD     Allergies  Allergen Reactions  . Sulfa Antibiotics Nausea And Vomiting  . Zofran [Ondansetron Hcl] Other (See Comments)    Oral numbness with IV dosing.        Objective:  Physical Exam  Constitutional: She is oriented to person, place, and time. She appears well-developed and well-nourished. No distress.  BP (!) 148/81   Pulse 69   Temp 98.6 F (37 C) (Oral)   Resp 16   Ht 5' 1.5" (1.562 m)   Wt 203 lb 6 oz (92.3 kg)   SpO2 98%   BMI 37.81 kg/m    HENT:  Head: Normocephalic and atraumatic.  Right Ear: Hearing, tympanic membrane, external ear and ear canal normal.  Left Ear: Hearing, tympanic membrane, external ear and ear canal normal.  Nose: Mucosal edema and rhinorrhea present.  No foreign bodies. Right sinus exhibits maxillary sinus tenderness. Right sinus exhibits no frontal sinus tenderness. Left sinus exhibits maxillary sinus tenderness. Left sinus exhibits no frontal sinus tenderness.  Mouth/Throat: Uvula is midline, oropharynx is clear and moist and mucous membranes are normal. No uvula swelling. No oropharyngeal exudate.  Eyes: Conjunctivae and EOM are normal. Pupils are equal, round, and reactive to light. Right eye exhibits no discharge. Left eye exhibits no discharge. No scleral icterus.  Neck: Trachea normal, normal range of motion and full passive range of motion without pain. Neck supple. No thyroid mass and no thyromegaly present.  Cardiovascular: Normal rate, regular rhythm and normal heart sounds.   Pulmonary/Chest: Effort normal and breath sounds normal.  Lymphadenopathy:       Head (right side): No submandibular, no tonsillar, no preauricular, no posterior auricular and no occipital adenopathy present.       Head (left side): No submandibular, no tonsillar, no preauricular and no occipital  adenopathy present.    She has no cervical adenopathy.       Right: No supraclavicular adenopathy present.       Left: No supraclavicular adenopathy present.  Neurological: She is alert and oriented to person, place, and time. She has normal strength. No cranial nerve deficit or sensory deficit.  Skin: Skin is warm, dry and intact. No rash noted.  Psychiatric: She has a normal mood and affect. Her speech is normal and behavior is normal.           Assessment & Plan:   1. Chronic sinusitis, unspecified location Supportive care.  Anticipatory guidance.  RTC if symptoms worsen/persist. - amoxicillin-clavulanate (AUGMENTIN) 875-125 MG tablet; Take 1 tablet by mouth 2 (two) times daily.  Dispense: 20 tablet; Refill: 0 - Dextromethorphan-Guaifenesin (MUCINEX DM MAXIMUM STRENGTH) 60-1200 MG TB12; Take 1 tablet by mouth every 12 (twelve) hours. (Patient not taking: Reported on 12/05/2016)  Dispense: 14 each; Refill: 0    Return if symptoms worsen or fail to improve.   Fara Chute, PA-C [facility]

## 2016-12-02 ENCOUNTER — Telehealth: Payer: Self-pay | Admitting: Family Medicine

## 2016-12-02 NOTE — Telephone Encounter (Signed)
[facility] is calling to get the right strenght for the nasal spray for this patient   [phone] [facility]

## 2016-12-03 NOTE — Telephone Encounter (Signed)
It is fine for the patient to take the Astelin 0.15% if her insurance will pay for it.  I do not want the patient to be charged for an rx that insurance will not cover or for a strength that has a higher copay.

## 2016-12-04 ENCOUNTER — Telehealth: Payer: Self-pay | Admitting: Endocrinology

## 2016-12-04 NOTE — Telephone Encounter (Signed)
Dr. Loanne Drilling, Could you please advise when you would like the patient to come in for her follow up due to the schedule being full? Thanks!

## 2016-12-04 NOTE — Telephone Encounter (Signed)
Patient states provider wanted her to come into the office a week after her surgery on the 26th of June. I did not see any appointments until the middle of July. Please advise.

## 2016-12-04 NOTE — Telephone Encounter (Signed)
8:45 AM, 12/24/16

## 2016-12-05 NOTE — Telephone Encounter (Signed)
Patient notified of appointment date and agreed. Patient added to the schedule.

## 2016-12-06 NOTE — Patient Instructions (Signed)
Elaine Jackson  12/06/2016   Your procedure is scheduled on: 12-17-16   Report to [facility] Main Entrance Take [place] Elevators to 3rd floor to [place] at 5:30 AM.   Call this number if you have problems the morning of surgery [phone]   Remember: ONLY 1 PERSON MAY GO WITH YOU TO SHORT STAY TO GET  READY MORNING OF [place].  Do not eat food or drink liquids :After Midnight.     Take these medicines the morning of surgery with A SIP OF WATER: None                               You may not have any metal on your body including hair pins and              piercings  Do not wear jewelry, make-up, lotions, powders or perfumes, deodorant             Do not wear nail polish.  Do not shave  48 hours prior to surgery.                Do not bring valuables to the hospital. [place].  Contacts, dentures or bridgework may not be worn into surgery.  Leave suitcase in the car. After surgery it may be brought to your room.     Please read over the following fact sheets you were given: _____________________________________________________________________             [facility] - Preparing for Surgery Before surgery, you can play an important role.  Because skin is not sterile, your skin needs to be as free of germs as possible.  You can reduce the number of germs on your skin by washing with CHG (chlorahexidine gluconate) soap before surgery.  CHG is an antiseptic cleaner which kills germs and bonds with the skin to continue killing germs even after washing. Please DO NOT use if you have an allergy to CHG or antibacterial soaps.  If your skin becomes reddened/irritated stop using the CHG and inform your nurse when you arrive at Short Stay. Do not shave (including legs and underarms) for at least 48 hours prior to the first CHG shower.  You may shave your face/neck. Please follow these instructions  carefully:  1.  Shower with CHG Soap the night before surgery and the  morning of Surgery.  2.  If you choose to wash your hair, wash your hair first as usual with your  normal  shampoo.  3.  After you shampoo, rinse your hair and body thoroughly to remove the  shampoo.                           4.  Use CHG as you would any other liquid soap.  You can apply chg directly  to the skin and wash                       Gently with a scrungie or clean washcloth.  5.  Apply the CHG Soap to your body ONLY FROM THE NECK DOWN.   Do not use on face/ open  Wound or open sores. Avoid contact with eyes, ears mouth and genitals (private parts).                       Wash face,  Genitals (private parts) with your normal soap.             6.  Wash thoroughly, paying special attention to the area where your surgery  will be performed.  7.  Thoroughly rinse your body with warm water from the neck down.  8.  DO NOT shower/wash with your normal soap after using and rinsing off  the CHG Soap.                9.  Pat yourself dry with a clean towel.            10.  Wear clean pajamas.            11.  Place clean sheets on your bed the night of your first shower and do not  sleep with pets. Day of Surgery : Do not apply any lotions/deodorants the morning of surgery.  Please wear clean clothes to the hospital/surgery center.  FAILURE TO FOLLOW THESE INSTRUCTIONS MAY RESULT IN THE CANCELLATION OF YOUR SURGERY PATIENT SIGNATURE_________________________________  NURSE SIGNATURE__________________________________  ________________________________________________________________________

## 2016-12-06 NOTE — Telephone Encounter (Signed)
[facility] made [facility] to [facility] 0.15 strength only if insurance covers.

## 2016-12-06 NOTE — Progress Notes (Signed)
11-25-16 (EPIC) EKG, CBC

## 2016-12-10 ENCOUNTER — Ambulatory Visit (HOSPITAL_COMMUNITY)
Admission: RE | Admit: 2016-12-10 | Discharge: 2016-12-10 | Disposition: A | Discharge status: Home / Self Care | Payer: BLUE CROSS/BLUE SHIELD | Source: Ambulatory Visit | Attending: Anesthesiology | Admitting: Anesthesiology

## 2016-12-10 ENCOUNTER — Encounter (HOSPITAL_COMMUNITY): Payer: Self-pay

## 2016-12-10 ENCOUNTER — Encounter (HOSPITAL_COMMUNITY)
Admission: RE | Admit: 2016-12-10 | Discharge: 2016-12-10 | Disposition: A | Discharge status: Home / Self Care | Payer: BLUE CROSS/BLUE SHIELD | Source: Ambulatory Visit | Attending: General Surgery | Admitting: General Surgery

## 2016-12-10 DIAGNOSIS — Z01818 Encounter for other preprocedural examination: Secondary | ICD-10-CM

## 2016-12-10 DIAGNOSIS — E041 Nontoxic single thyroid nodule: Secondary | ICD-10-CM | POA: Insufficient documentation

## 2016-12-10 LAB — BASIC METABOLIC PANEL
ANION GAP: 9 (ref 5–15)
BUN: 17 mg/dL (ref 6–20)
CHLORIDE: 106 mmol/L (ref 101–111)
CO2: 24 mmol/L (ref 22–32)
Calcium: 9.3 mg/dL (ref 8.9–10.3)
Creatinine, Ser: 0.86 mg/dL (ref 0.44–1.00)
GFR calc Af Amer: >60 mL/min (ref >60)
GFR calc non Af Amer: >60 mL/min (ref >60)
Glucose, Bld: 119 mg/dL — ABNORMAL HIGH (ref 65–99)
Potassium: 4.1 mmol/L (ref 3.5–5.1)
SODIUM: 139 mmol/L (ref 135–145)

## 2016-12-11 LAB — HEMOGLOBIN A1C
Hgb A1c MFr Bld: 5.9 % — ABNORMAL HIGH (ref 4.8–5.6)
MEAN PLASMA GLUCOSE: 123 mg/dL

## 2016-12-12 IMAGING — CT CT HEAD W/O CM
2 series · 16 of 30 positions shown, 20 images · non-contrast
Comparison: None.

CLINICAL DATA: Headache, dizziness, jaw tenderness for 8 hr.

EXAM:
CT HEAD WITHOUT CONTRAST
TECHNIQUE: Contiguous axial images were obtained from the base of the skull
through the vertex without intravenous contrast.

[Series 201: head w/o, idose (1) · axial · non-contrast · 0.42mm/px · z∈[+94,+219]mm · 13 of 31 slices shown, 17 images]
[im 3/31  brain]
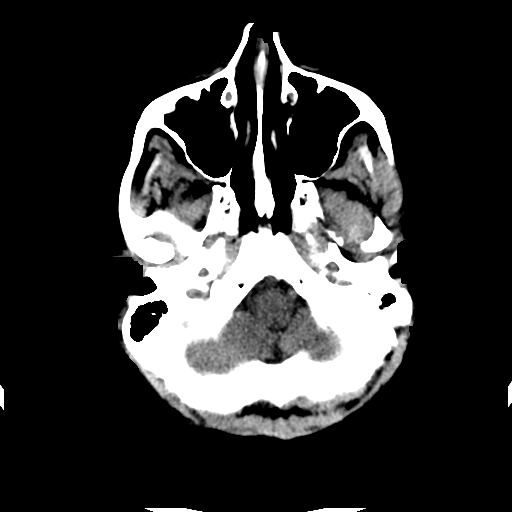
[im 3/31  bone]
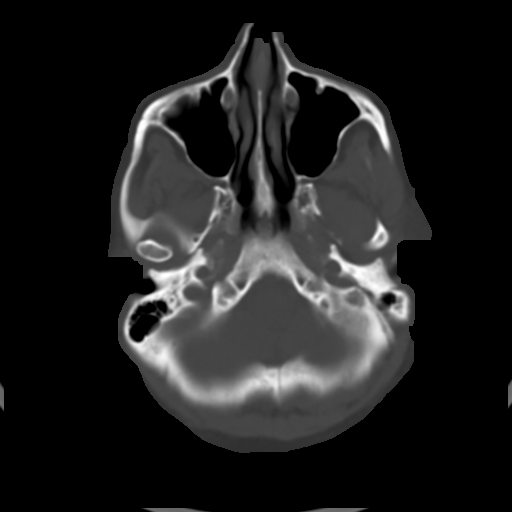
[im 5/31  brain]
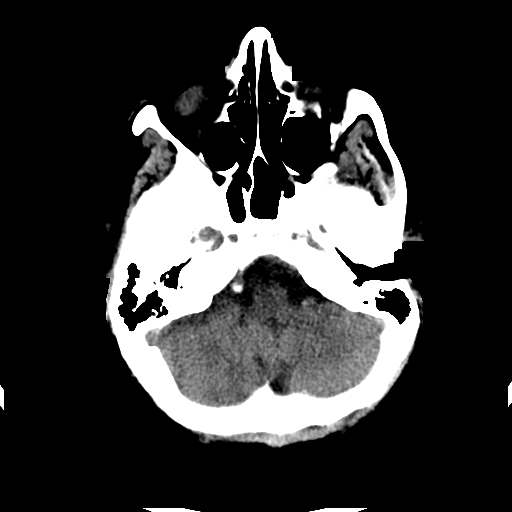
[im 7/31  brain]
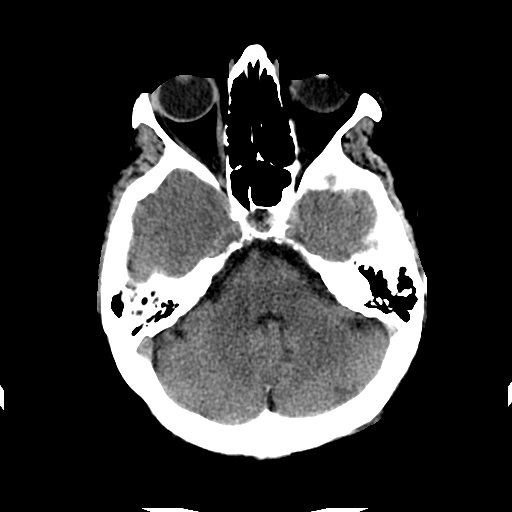
[im 9/31  brain]
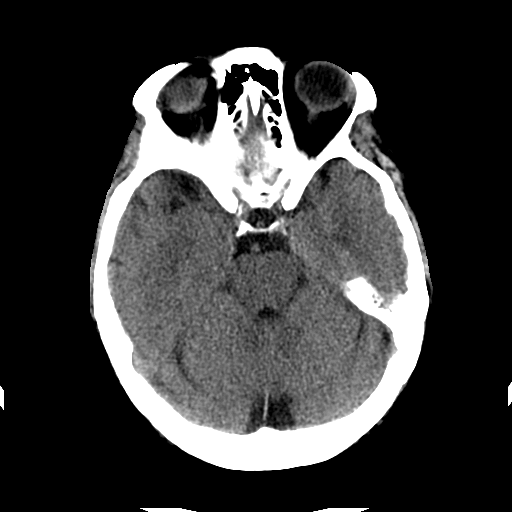
[im 11/31  brain]
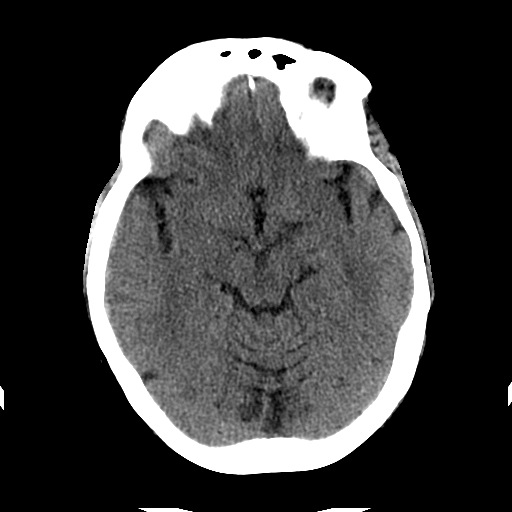
[im 11/31  bone]
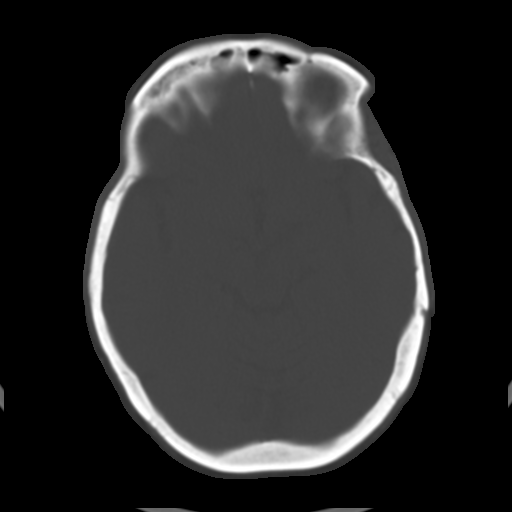
[im 13/31  brain]
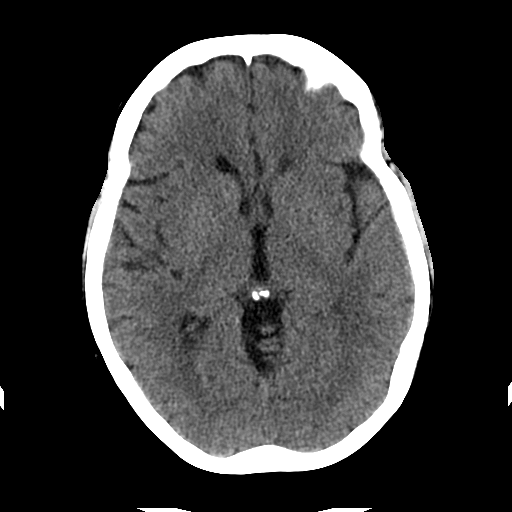
[im 16/31  brain]
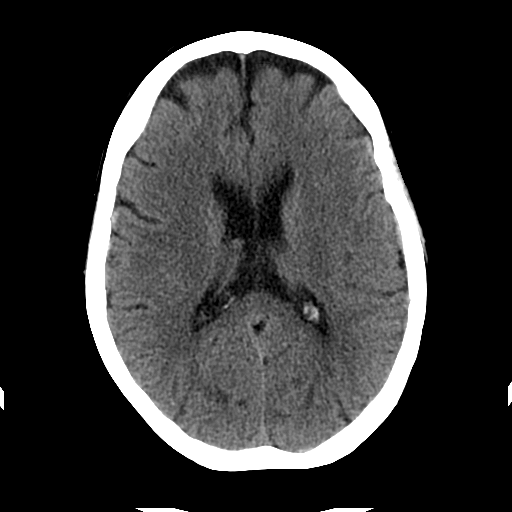
[im 18/31  brain]
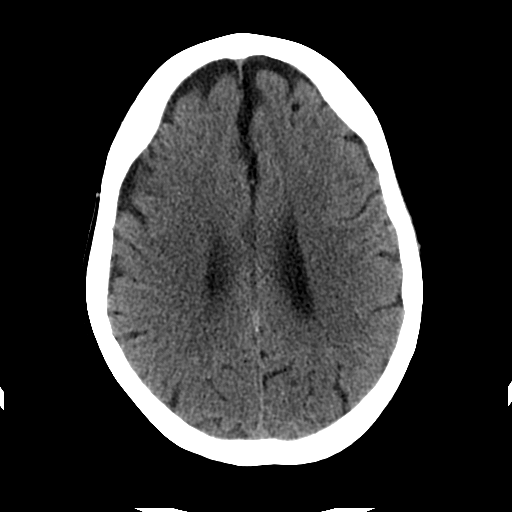
[im 20/31  brain]
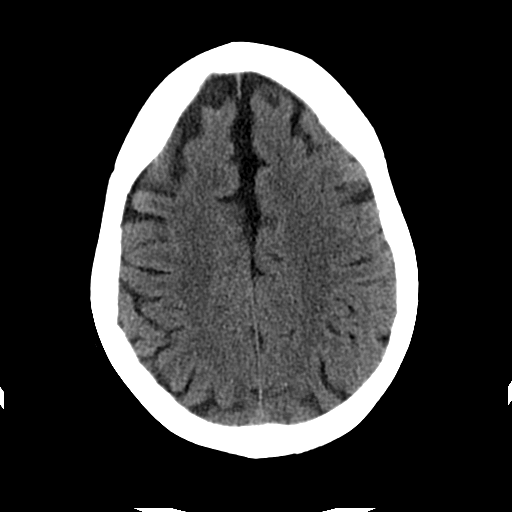
[im 20/31  bone]
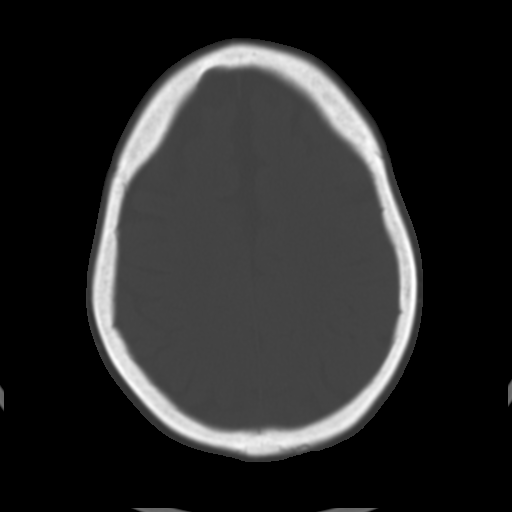
[im 22/31  brain]
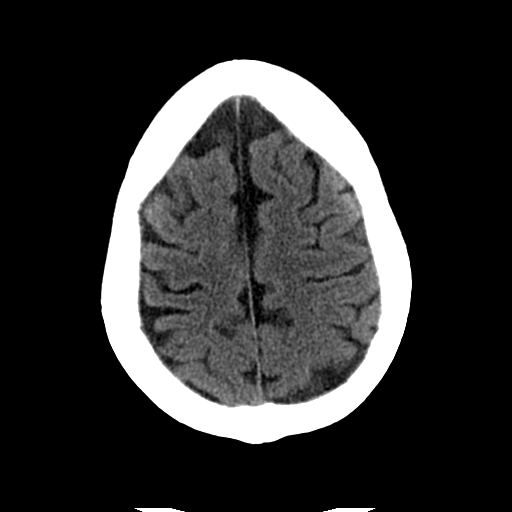
[im 24/31  brain]
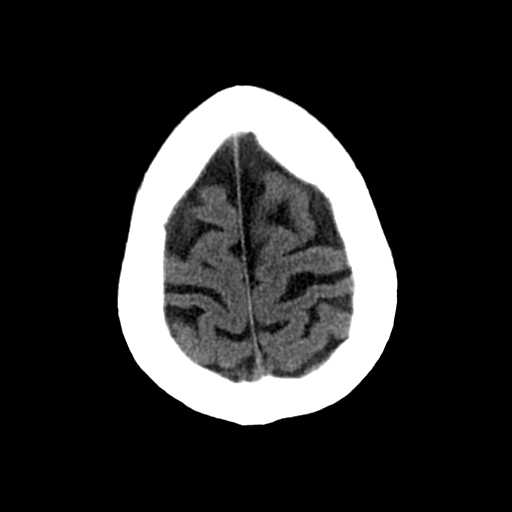
[im 26/31  brain]
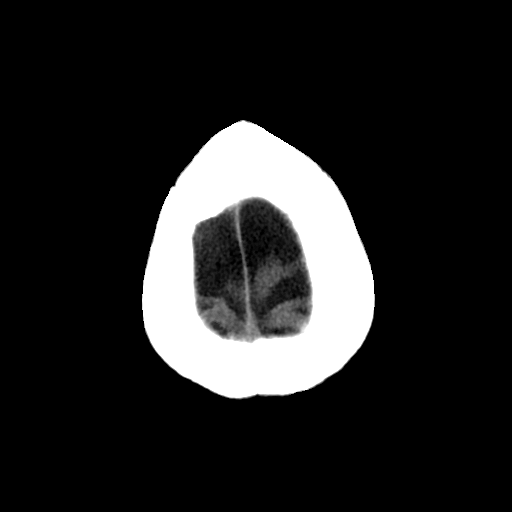
[im 28/31  brain]
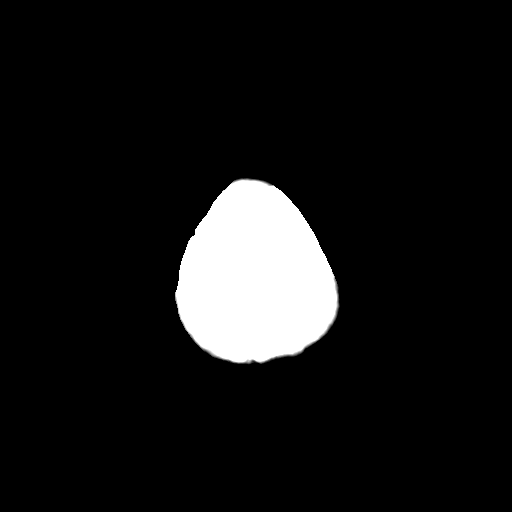
[im 28/31  bone]
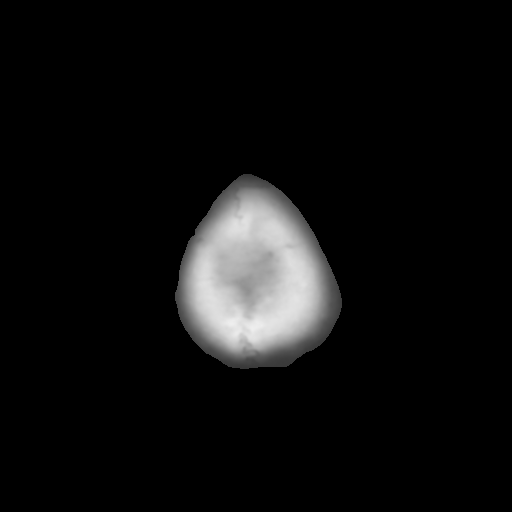

[Series 202: head w/o bone, idose (1) · axial · non-contrast · 0.42mm/px · z∈[+94,+134]mm · 3 of 31 slices shown]
[im 3/31  bone]
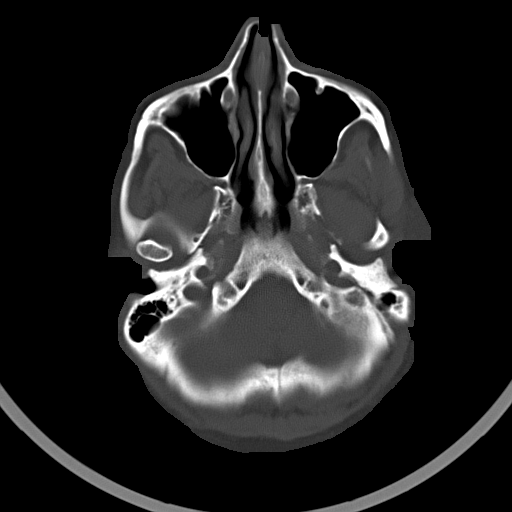
[im 7/31  bone]
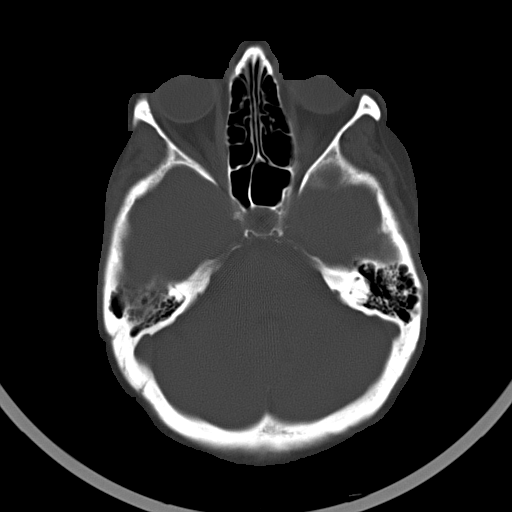
[im 11/31  bone]
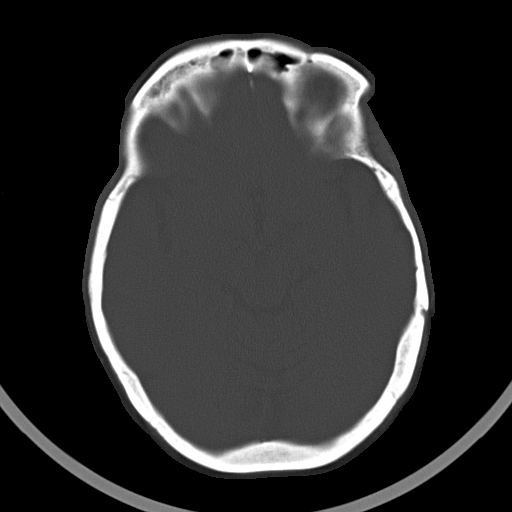

[16 of 30 positions shown; findings below may reference images not displayed]

FINDINGS: Generalized cerebral atrophy, slightly advanced for age. No
intracranial hemorrhage, mass effect, or midline shift. No
hydrocephalus. The basilar cisterns are patent. No evidence of
territorial infarct. No intracranial fluid collection. Calvarium is
intact. Included paranasal sinuses and mastoid air cells are well
aerated.
IMPRESSION: 1. Mild generalized atrophy, slightly advanced for age.
2. No acute intracranial abnormality.

## 2016-12-16 NOTE — Anesthesia Preprocedure Evaluation (Signed)
Anesthesia Evaluation  Patient identified by MRN, date of birth, ID band Patient awake    Reviewed: Allergy & Precautions, NPO status , Patient's Chart, lab work & pertinent test results  Airway Mallampati: II  TM Distance: >3 FB Neck ROM: Full    Dental no notable dental hx.    Pulmonary neg pulmonary ROS, former smoker,    Pulmonary exam normal breath sounds clear to auscultation       Cardiovascular hypertension, Pt. on medications + CAD, + Past MI and + Peripheral Vascular Disease  Normal cardiovascular exam+ Valvular Problems/Murmurs  Rhythm:Regular Rate:Normal     Neuro/Psych  Headaches, CVA negative psych ROS   GI/Hepatic negative GI ROS, Neg liver ROS,   Endo/Other  diabetes, Type 2  Renal/GU Renal disease     Musculoskeletal negative musculoskeletal ROS (+)   Abdominal   Peds  Hematology negative hematology ROS (+)   Anesthesia Other Findings   Reproductive/Obstetrics negative OB ROS                             Anesthesia Physical Anesthesia Plan  ASA: III  Anesthesia Plan: General   Post-op Pain Management: GA combined w/ Regional for post-op pain   Induction: Intravenous  PONV Risk Score and Plan: 3 and Ondansetron, Dexamethasone, Propofol and Midazolam  Airway Management Planned: Oral ETT  Additional Equipment: None  Intra-op Plan:   Post-operative Plan: Extubation in OR  Informed Consent: I have reviewed the patients History and Physical, chart, labs and discussed the procedure including the risks, benefits and alternatives for the proposed anesthesia with the patient or authorized representative who has indicated his/her understanding and acceptance.   Dental advisory given  Plan Discussed with: CRNA  Anesthesia Plan Comments:         Anesthesia Quick Evaluation

## 2016-12-17 ENCOUNTER — Encounter (HOSPITAL_COMMUNITY)
Admission: AD | Disposition: A | Discharge status: Home / Self Care | Payer: Self-pay | Source: Ambulatory Visit | Attending: General Surgery

## 2016-12-17 ENCOUNTER — Observation Stay (HOSPITAL_COMMUNITY)
Admission: AD | Admit: 2016-12-17 | Discharge: 2016-12-18 | DRG: 627 | Disposition: A | Discharge status: Home / Self Care | Payer: BLUE CROSS/BLUE SHIELD | Source: Ambulatory Visit | Attending: General Surgery | Admitting: General Surgery

## 2016-12-17 ENCOUNTER — Ambulatory Visit (HOSPITAL_COMMUNITY): Payer: BLUE CROSS/BLUE SHIELD | Admitting: Anesthesiology

## 2016-12-17 ENCOUNTER — Encounter (HOSPITAL_COMMUNITY): Payer: Self-pay | Admitting: *Deleted

## 2016-12-17 DIAGNOSIS — E041 Nontoxic single thyroid nodule: Secondary | ICD-10-CM | POA: Diagnosis present

## 2016-12-17 DIAGNOSIS — E1151 Type 2 diabetes mellitus with diabetic peripheral angiopathy without gangrene: Secondary | ICD-10-CM | POA: Diagnosis not present

## 2016-12-17 DIAGNOSIS — E7800 Pure hypercholesterolemia, unspecified: Secondary | ICD-10-CM | POA: Insufficient documentation

## 2016-12-17 DIAGNOSIS — Z7901 Long term (current) use of anticoagulants: Secondary | ICD-10-CM | POA: Diagnosis not present

## 2016-12-17 DIAGNOSIS — I2510 Atherosclerotic heart disease of native coronary artery without angina pectoris: Secondary | ICD-10-CM | POA: Insufficient documentation

## 2016-12-17 DIAGNOSIS — Z9884 Bariatric surgery status: Secondary | ICD-10-CM | POA: Diagnosis not present

## 2016-12-17 DIAGNOSIS — Z8673 Personal history of transient ischemic attack (TIA), and cerebral infarction without residual deficits: Secondary | ICD-10-CM | POA: Insufficient documentation

## 2016-12-17 DIAGNOSIS — D34 Benign neoplasm of thyroid gland: Principal | ICD-10-CM | POA: Insufficient documentation

## 2016-12-17 DIAGNOSIS — I4891 Unspecified atrial fibrillation: Secondary | ICD-10-CM | POA: Insufficient documentation

## 2016-12-17 DIAGNOSIS — Z7984 Long term (current) use of oral hypoglycemic drugs: Secondary | ICD-10-CM | POA: Diagnosis not present

## 2016-12-17 DIAGNOSIS — I252 Old myocardial infarction: Secondary | ICD-10-CM | POA: Insufficient documentation

## 2016-12-17 DIAGNOSIS — I10 Essential (primary) hypertension: Secondary | ICD-10-CM | POA: Diagnosis not present

## 2016-12-17 DIAGNOSIS — Z79899 Other long term (current) drug therapy: Secondary | ICD-10-CM | POA: Diagnosis not present

## 2016-12-17 DIAGNOSIS — E890 Postprocedural hypothyroidism: Secondary | ICD-10-CM

## 2016-12-17 DIAGNOSIS — Z87891 Personal history of nicotine dependence: Secondary | ICD-10-CM | POA: Insufficient documentation

## 2016-12-17 DIAGNOSIS — E119 Type 2 diabetes mellitus without complications: Secondary | ICD-10-CM | POA: Diagnosis not present

## 2016-12-17 HISTORY — PX: THYROIDECTOMY: SHX17

## 2016-12-17 LAB — TROPONIN I: Troponin I: <0.03 ng/mL (ref <0.03)

## 2016-12-17 LAB — GLUCOSE, CAPILLARY
GLUCOSE-CAPILLARY: 183 mg/dL — AB (ref 65–99)
Glucose-Capillary: 190 mg/dL — ABNORMAL HIGH (ref 65–99)
Glucose-Capillary: 187 mg/dL — ABNORMAL HIGH (ref 65–99)

## 2016-12-17 SURGERY — THYROIDECTOMY
Anesthesia: General

## 2016-12-17 MED ORDER — SCOPOLAMINE 1 MG/3DAYS TD PT72
MEDICATED_PATCH | TRANSDERMAL | Status: AC
  Filled 2016-12-17: qty 1

## 2016-12-17 MED ORDER — ACETAMINOPHEN 500 MG PO TABS
1000.0000 mg | ORAL_TABLET | ORAL | Status: AC
  Administered 2016-12-17: 1000 mg via ORAL
  Filled 2016-12-17: qty 2

## 2016-12-17 MED ORDER — ROCURONIUM BROMIDE 10 MG/ML (PF) SYRINGE
PREFILLED_SYRINGE | INTRAVENOUS | Status: DC | PRN
Start: 2016-12-17 — End: 2016-12-17
  Administered 2016-12-17: 10 mg via INTRAVENOUS
  Administered 2016-12-17: 50 mg via INTRAVENOUS
  Administered 2016-12-17: 10 mg via INTRAVENOUS

## 2016-12-17 MED ORDER — LIDOCAINE 2% (20 MG/ML) 5 ML SYRINGE
INTRAMUSCULAR | Status: AC
  Filled 2016-12-17: qty 5

## 2016-12-17 MED ORDER — MIDAZOLAM HCL 2 MG/2ML IJ SOLN
INTRAMUSCULAR | Status: AC
Start: 2016-12-17 — End: 2016-12-17
  Filled 2016-12-17: qty 2

## 2016-12-17 MED ORDER — LEVOTHYROXINE SODIUM 100 MCG PO TABS
100.0000 ug | ORAL_TABLET | Freq: Every day | ORAL | Status: DC
  Administered 2016-12-18: 100 ug via ORAL
  Filled 2016-12-17: qty 1

## 2016-12-17 MED ORDER — CALCIUM CARBONATE ANTACID 500 MG PO CHEW
2.0000 | CHEWABLE_TABLET | Freq: Three times a day (TID) | ORAL | Status: DC
  Administered 2016-12-17 – 2016-12-18 (×2): 400 mg via ORAL
  Filled 2016-12-17 (×2): qty 2

## 2016-12-17 MED ORDER — MONTELUKAST SODIUM 10 MG PO TABS
10.0000 mg | ORAL_TABLET | Freq: Every day | ORAL | Status: DC
  Administered 2016-12-17: 10 mg via ORAL
  Filled 2016-12-17: qty 1

## 2016-12-17 MED ORDER — MIDAZOLAM HCL 5 MG/5ML IJ SOLN
INTRAMUSCULAR | Status: DC | PRN
  Administered 2016-12-17: 2 mg via INTRAVENOUS

## 2016-12-17 MED ORDER — GABAPENTIN 300 MG PO CAPS
300.0000 mg | ORAL_CAPSULE | ORAL | Status: AC
  Administered 2016-12-17: 300 mg via ORAL
  Filled 2016-12-17: qty 1

## 2016-12-17 MED ORDER — BUPIVACAINE-EPINEPHRINE (PF) 0.25% -1:200000 IJ SOLN
INTRAMUSCULAR | Status: DC | PRN
  Administered 2016-12-17: 20 mL

## 2016-12-17 MED ORDER — DEXAMETHASONE SODIUM PHOSPHATE 10 MG/ML IJ SOLN
INTRAMUSCULAR | Status: DC | PRN
  Administered 2016-12-17: 10 mg via INTRAVENOUS

## 2016-12-17 MED ORDER — CHLORHEXIDINE GLUCONATE CLOTH 2 % EX PADS: 6.0000 | MEDICATED_PAD | Freq: Once | CUTANEOUS | Status: DC

## 2016-12-17 MED ORDER — OXYCODONE HCL 5 MG PO TABS: 5.0000 mg | ORAL_TABLET | ORAL | Status: DC | PRN

## 2016-12-17 MED ORDER — ATORVASTATIN CALCIUM 80 MG PO TABS
80.0000 mg | ORAL_TABLET | Freq: Every day | ORAL | Status: DC
  Administered 2016-12-17: 80 mg via ORAL
  Filled 2016-12-17: qty 1
  Filled 2016-12-17: qty 2

## 2016-12-17 MED ORDER — MEPERIDINE HCL 50 MG/ML IJ SOLN: 6.2500 mg | INTRAMUSCULAR | Status: DC | PRN

## 2016-12-17 MED ORDER — CEFAZOLIN SODIUM-DEXTROSE 2-4 GM/100ML-% IV SOLN
INTRAVENOUS | Status: AC
  Filled 2016-12-17: qty 100

## 2016-12-17 MED ORDER — PROMETHAZINE HCL 25 MG/ML IJ SOLN: 12.5000 mg | Freq: Four times a day (QID) | INTRAMUSCULAR | Status: DC | PRN

## 2016-12-17 MED ORDER — SUGAMMADEX SODIUM 200 MG/2ML IV SOLN
INTRAVENOUS | Status: AC
  Filled 2016-12-17: qty 2

## 2016-12-17 MED ORDER — ACETAMINOPHEN 325 MG PO TABS
650.0000 mg | ORAL_TABLET | Freq: Once | ORAL | Status: AC
  Administered 2016-12-17: 650 mg via ORAL
  Filled 2016-12-17: qty 2

## 2016-12-17 MED ORDER — SCOPOLAMINE 1 MG/3DAYS TD PT72
MEDICATED_PATCH | TRANSDERMAL | Status: DC | PRN
  Administered 2016-12-17: 1 via TRANSDERMAL

## 2016-12-17 MED ORDER — PHENYLEPHRINE 40 MCG/ML (10ML) SYRINGE FOR IV PUSH (FOR BLOOD PRESSURE SUPPORT)
PREFILLED_SYRINGE | INTRAVENOUS | Status: DC | PRN
  Administered 2016-12-17 (×2): 80 ug via INTRAVENOUS

## 2016-12-17 MED ORDER — FENTANYL CITRATE (PF) 100 MCG/2ML IJ SOLN
INTRAMUSCULAR | Status: AC
  Filled 2016-12-17: qty 2

## 2016-12-17 MED ORDER — PROMETHAZINE HCL 25 MG/ML IJ SOLN: 6.2500 mg | INTRAMUSCULAR | Status: DC | PRN

## 2016-12-17 MED ORDER — FENTANYL CITRATE (PF) 100 MCG/2ML IJ SOLN
INTRAMUSCULAR | Status: AC
  Filled 2016-12-17: qty 4

## 2016-12-17 MED ORDER — FENTANYL CITRATE (PF) 100 MCG/2ML IJ SOLN
INTRAMUSCULAR | Status: DC | PRN
  Administered 2016-12-17 (×2): 50 ug via INTRAVENOUS
  Administered 2016-12-17: 100 ug via INTRAVENOUS
  Administered 2016-12-17: 50 ug via INTRAVENOUS

## 2016-12-17 MED ORDER — HYDROMORPHONE HCL 1 MG/ML IJ SOLN
0.5000 mg | INTRAMUSCULAR | Status: DC | PRN
  Administered 2016-12-17: 0.5 mg via INTRAVENOUS
  Filled 2016-12-17: qty 0.5

## 2016-12-17 MED ORDER — SUGAMMADEX SODIUM 200 MG/2ML IV SOLN
INTRAVENOUS | Status: DC | PRN
  Administered 2016-12-17: 200 mg via INTRAVENOUS

## 2016-12-17 MED ORDER — INSULIN ASPART 100 UNIT/ML ~~LOC~~ SOLN
0.0000 [IU] | Freq: Three times a day (TID) | SUBCUTANEOUS | Status: DC
  Administered 2016-12-17 – 2016-12-18 (×2): 3 [IU] via SUBCUTANEOUS

## 2016-12-17 MED ORDER — DILTIAZEM HCL 30 MG PO TABS: 30.0000 mg | ORAL_TABLET | Freq: Four times a day (QID) | ORAL | Status: DC | PRN

## 2016-12-17 MED ORDER — FENTANYL CITRATE (PF) 100 MCG/2ML IJ SOLN
25.0000 ug | INTRAMUSCULAR | Status: DC | PRN
  Administered 2016-12-17: 25 ug via INTRAVENOUS
  Administered 2016-12-17: 50 ug via INTRAVENOUS

## 2016-12-17 MED ORDER — FLUTICASONE PROPIONATE 50 MCG/ACT NA SUSP
2.0000 | Freq: Every day | NASAL | Status: DC | PRN
  Filled 2016-12-17: qty 16

## 2016-12-17 MED ORDER — SUCCINYLCHOLINE CHLORIDE 200 MG/10ML IV SOSY
PREFILLED_SYRINGE | INTRAVENOUS | Status: DC | PRN
  Administered 2016-12-17: 10 mg via INTRAVENOUS

## 2016-12-17 MED ORDER — PROPOFOL 10 MG/ML IV BOLUS
INTRAVENOUS | Status: AC
  Filled 2016-12-17: qty 20

## 2016-12-17 MED ORDER — PHENYLEPHRINE 40 MCG/ML (10ML) SYRINGE FOR IV PUSH (FOR BLOOD PRESSURE SUPPORT)
PREFILLED_SYRINGE | INTRAVENOUS | Status: AC
  Filled 2016-12-17: qty 10

## 2016-12-17 MED ORDER — LACTATED RINGERS IV SOLN
INTRAVENOUS | Status: DC
  Administered 2016-12-17: 21:00:00 via INTRAVENOUS

## 2016-12-17 MED ORDER — ENOXAPARIN SODIUM 40 MG/0.4ML ~~LOC~~ SOLN
40.0000 mg | SUBCUTANEOUS | Status: DC
  Administered 2016-12-18: 40 mg via SUBCUTANEOUS
  Filled 2016-12-17: qty 0.4

## 2016-12-17 MED ORDER — LACTATED RINGERS IV SOLN
INTRAVENOUS | Status: DC | PRN
  Administered 2016-12-17 (×2): via INTRAVENOUS

## 2016-12-17 MED ORDER — LIDOCAINE 2% (20 MG/ML) 5 ML SYRINGE
INTRAMUSCULAR | Status: DC | PRN
  Administered 2016-12-17: 100 mg via INTRAVENOUS

## 2016-12-17 MED ORDER — CEFAZOLIN SODIUM-DEXTROSE 2-4 GM/100ML-% IV SOLN
2.0000 g | INTRAVENOUS | Status: AC
  Administered 2016-12-17: 2 g via INTRAVENOUS

## 2016-12-17 MED ORDER — OXYCODONE HCL 5 MG PO TABS: 5.0000 mg | ORAL_TABLET | Freq: Once | ORAL | Status: DC | PRN

## 2016-12-17 MED ORDER — PROPOFOL 10 MG/ML IV BOLUS
INTRAVENOUS | Status: DC | PRN
  Administered 2016-12-17: 160 mg via INTRAVENOUS

## 2016-12-17 MED ORDER — BUPIVACAINE-EPINEPHRINE (PF) 0.25% -1:200000 IJ SOLN
INTRAMUSCULAR | Status: AC
  Filled 2016-12-17: qty 30

## 2016-12-17 MED ORDER — ROCURONIUM BROMIDE 50 MG/5ML IV SOSY
PREFILLED_SYRINGE | INTRAVENOUS | Status: AC
  Filled 2016-12-17: qty 5

## 2016-12-17 MED ORDER — DEXAMETHASONE SODIUM PHOSPHATE 10 MG/ML IJ SOLN
INTRAMUSCULAR | Status: AC
  Filled 2016-12-17: qty 1

## 2016-12-17 MED ORDER — OXYCODONE HCL 5 MG/5ML PO SOLN
5.0000 mg | Freq: Once | ORAL | Status: DC | PRN
  Filled 2016-12-17: qty 5

## 2016-12-17 SURGICAL SUPPLY — 41 items
APL SKNCLS STERI-STRIP NONHPOA (GAUZE/BANDAGES/DRESSINGS) ×1
ATTRACTOMAT 16X20 MAGNETIC DRP (DRAPES) ×2 IMPLANT
BENZOIN TINCTURE PRP APPL 2/3 (GAUZE/BANDAGES/DRESSINGS) ×2 IMPLANT
BLADE SURG 15 STRL LF DISP TIS (BLADE) ×1 IMPLANT
BLADE SURG 15 STRL SS (BLADE) ×2
BLADE SURG SZ10 CARB STEEL (BLADE) ×2 IMPLANT
CLIP TI MEDIUM 6 (CLIP) ×2 IMPLANT
CLIP TI WIDE RED SMALL 6 (CLIP) ×7 IMPLANT
COVER SURGICAL LIGHT HANDLE (MISCELLANEOUS) ×2 IMPLANT
DISSECTOR ROUND CHERRY 3/8 STR (MISCELLANEOUS) ×3 IMPLANT
DRAPE LAPAROTOMY T 98X78 PEDS (DRAPES) ×2 IMPLANT
ELECT COATED BLADE 2.86 ST (ELECTRODE) ×2 IMPLANT
ELECT PENCIL ROCKER SW 15FT (MISCELLANEOUS) ×2 IMPLANT
ELECT REM PT RETURN 15FT ADLT (MISCELLANEOUS) ×2 IMPLANT
GAUZE SPONGE 4X4 12PLY STRL (GAUZE/BANDAGES/DRESSINGS) ×3 IMPLANT
GAUZE SPONGE 4X4 16PLY XRAY LF (GAUZE/BANDAGES/DRESSINGS) ×2 IMPLANT
GOWN STRL REUS W/TWL LRG LVL3 (GOWN DISPOSABLE) ×2 IMPLANT
GOWN STRL REUS W/TWL XL LVL3 (GOWN DISPOSABLE) ×4 IMPLANT
HEMOSTAT SURGICEL 2X14 (HEMOSTASIS) IMPLANT
HEMOSTAT SURGICEL 2X4 FIBR (HEMOSTASIS) ×2 IMPLANT
ILLUMINATOR WAVEGUIDE N/F (MISCELLANEOUS) ×1 IMPLANT
KIT BASIN OR (CUSTOM PROCEDURE TRAY) ×2 IMPLANT
LIGHT WAVEGUIDE WIDE FLAT (MISCELLANEOUS) IMPLANT
MARKER SKIN DUAL TIP RULER LAB (MISCELLANEOUS) ×2 IMPLANT
NEEDLE HYPO 22GX1.5 SAFETY (NEEDLE) ×1 IMPLANT
NS IRRIG 1000ML POUR BTL (IV SOLUTION) ×2 IMPLANT
PACK BASIC VI WITH GOWN DISP (CUSTOM PROCEDURE TRAY) ×2 IMPLANT
SHEARS HARMONIC 9CM CVD (BLADE) ×2 IMPLANT
STAPLER VISISTAT 35W (STAPLE) ×2 IMPLANT
STRIP CLOSURE SKIN 1/2X4 (GAUZE/BANDAGES/DRESSINGS) ×1 IMPLANT
SUT MON AB 5-0 PS2 18 (SUTURE) ×2 IMPLANT
SUT SILK 2 0 (SUTURE) ×2
SUT SILK 2-0 18XBRD TIE 12 (SUTURE) ×1 IMPLANT
SUT SILK 3 0 (SUTURE)
SUT SILK 3-0 18XBRD TIE 12 (SUTURE) IMPLANT
SUT VIC AB 3-0 SH 18 (SUTURE) ×3 IMPLANT
SUT VICRYL 2 0 18  UND BR (SUTURE)
SUT VICRYL 2 0 18 UND BR (SUTURE) IMPLANT
SYR CONTROL 10ML LL (SYRINGE) ×1 IMPLANT
TOWEL OR 17X26 10 PK STRL BLUE (TOWEL DISPOSABLE) ×2 IMPLANT
YANKAUER SUCT BULB TIP 10FT TU (MISCELLANEOUS) ×2 IMPLANT

## 2016-12-17 NOTE — Plan of Care (Signed)
Problem: Safety: Goal: Ability to remain free from injury will improve Outcome: Completed/Met Date Met: 12/17/16 GOAL 06/26- DISCUSSED SAFETY PREVENTION PLAN. PT VERBALIZED UNDERSTANDING

## 2016-12-17 NOTE — Anesthesia Procedure Notes (Signed)
Procedure Name: Intubation Date/Time: 12/17/2016 7:38 AM Performed by: Lind Covert Pre-anesthesia Checklist: Patient identified, Emergency Drugs available, Suction available, Patient being monitored and Timeout performed Patient Re-evaluated:Patient Re-evaluated prior to inductionOxygen Delivery Method: Circle system utilized Preoxygenation: Pre-oxygenation with 100% oxygen Intubation Type: IV induction Ventilation: Mask ventilation without difficulty Laryngoscope Size: Mac and 3 Grade View: Grade I Tube type: Oral Tube size: 7.0 mm Number of attempts: 1 Airway Equipment and Method: Stylet Placement Confirmation: ETT inserted through vocal cords under direct vision,  positive ETCO2 and breath sounds checked- equal and bilateral Secured at: 21 cm Tube secured with: Tape Dental Injury: Teeth and Oropharynx as per pre-operative assessment

## 2016-12-17 NOTE — Anesthesia Postprocedure Evaluation (Signed)
Anesthesia Post Note  Patient: Elaine Jackson  Procedure(s) Performed: Procedure(s) (LRB): TOTAL THYROIDECTOMY (N/A)     Patient location during evaluation: PACU Anesthesia Type: General Level of consciousness: sedated and patient cooperative Pain management: pain level controlled Vital Signs Assessment: post-procedure vital signs reviewed and stable Respiratory status: spontaneous breathing Cardiovascular status: stable Anesthetic complications: no    Last Vitals:  Vitals:   12/17/16 1245 12/17/16 1322  BP: (!) 171/80 (!) 157/70  Pulse:  (!) 54  Resp:  12  Temp: 36.6 C 36.8 C    Last Pain:  Vitals:   12/17/16 1520  TempSrc:   PainSc: [place]

## 2016-12-17 NOTE — H&P (Signed)
History of Present Illness The patient is a 61 year old female is here for LapBand followup. She returns to the office for 2 separate issues. She is status post lap band placement January 2012 for morbid obesity with comorbidities of adult onset diabetes mellitus, hypertension and elevated cholesterol. She was last seen here in 2013 at which point she had an approximately 24 pound weight loss. She had not returned for follow-up since. She has had several recent medical issues including onset of atrial fibrillation requiring chronic anticoagulation and also had a apparent mild CVA requiring a brief hospitalization and has recovered completely. However workup for that revealed a thyroid nodule as detailed below. In terms for lap band she would like to try to continue to lose weight in terms of her general health and the above issues. She states that her weight has been below 200 at one point although currently at 204 she is down 7 pounds from her last visit here and down 31 pounds overall. She has had resolution of her diabetes but has comorbidities as above as well as continued moderate hypertension and elevated cholesterol. She has no symptoms of over restriction on questioning specifically vomiting or regurgitation or reflux. She sometimes can eat more than she should and others she has pretty good restriction. She is trying to increase her exercise. She feels she could use a fill.  The second issue which came up since she made this appointment is a new diagnosis of a suspicious left thyroid nodule. At the time of her CT angiogram a thyroid nodule was noted. She subsequently had thyroid ultrasound that showed normal-appearing thyroid lobes but an approximately 3.5 cm somewhat suspicious appearing mass in the lower left thyroid lobe. Subsequently she has undergone fine-needle aspiration which is read as suspicious class IV with concern for a follicular variant of papillary carcinoma. She has no  symptoms related to her neck prior to the biopsy although does notice some occasional difficulty swallowing just since the biopsy which caused some bruising. No history of head and neck radiation. No family history of thyroid cancer. No symptoms of hyper or hypothyroidism.    Past Surgical History  Lap Band  Tonsillectomy   Diagnostic Studies History  Colonoscopy  5-10 years ago Mammogram  within last year Pap Smear  1-5 years ago  Allergies Ondansetron HCl *ANTIEMETICS*  Sulfa Antibiotics   Medication History  Atorvastatin Calcium (80MG Tablet, Oral) Active. DilTIAZem HCl (30MG Tablet, Oral) Active. Eliquis (5MG Tablet, Oral) Active. Montelukast Sodium (10MG Tablet, Oral) Active. Triamcinolone Acetonide (0.1% Cream, External) Active. MetFORMIN HCl (500MG Tablet, Oral) Active. Simvastatin (20MG Tablet, Oral) Active. TraZODone HCl (50MG Tablet, Oral) Active. TRUEresult Blood Glucose (w/Device Kit,) Active. Multivitamins (Oral) Active. Medications Reconciled  Social History  Alcohol use  Occasional alcohol use. Caffeine use  Coffee. No drug use  Tobacco use  Former smoker.  Family History Arthritis  Brother, Sister. Breast Cancer  Sister. Cancer  Father. Cervical Cancer  Sister. Colon Polyps  Daughter. Heart disease in female family member before age 95  Prostate Cancer  Father.  Pregnancy / Birth History  Age at menarche  26 years. Age of menopause  37-55 Gravida  4 Length (months) of breastfeeding  3-6 Maternal age  41-25 Para  1  Other Problems Atrial Fibrillation  Cerebrovascular Accident  Myocardial infarction  Thyroid Cancer  Thyroid Disease     Review of Systems General Not Present- Appetite Loss, Chills, Fatigue, Fever, Night Sweats, Weight Gain and Weight Loss. Skin Not  Present- Change in Wart/Mole, Dryness, Hives, Jaundice, New Lesions, Non-Healing Wounds, Rash and Ulcer. HEENT Present- Seasonal  Allergies. Not Present- Earache, Hearing Loss, Hoarseness, Nose Bleed, Oral Ulcers, Ringing in the Ears, Sinus Pain, Sore Throat, Visual Disturbances, Wears glasses/contact lenses and Yellow Eyes. Respiratory Not Present- Bloody sputum, Chronic Cough, Difficulty Breathing, Snoring and Wheezing. Breast Not Present- Breast Mass, Breast Pain, Nipple Discharge and Skin Changes. Cardiovascular Present- Leg Cramps. Not Present- Chest Pain, Difficulty Breathing Lying Down, Palpitations, Rapid Heart Rate, Shortness of Breath and Swelling of Extremities. Gastrointestinal Not Present- Abdominal Pain, Bloating, Bloody Stool, Change in Bowel Habits, Chronic diarrhea, Constipation, Difficulty Swallowing, Excessive gas, Gets full quickly at meals, Hemorrhoids, Indigestion, Nausea, Rectal Pain and Vomiting. Female Genitourinary Not Present- Frequency, Nocturia, Painful Urination, Pelvic Pain and Urgency. Neurological Not Present- Decreased Memory, Fainting, Headaches, Numbness, Seizures, Tingling, Tremor, Trouble walking and Weakness. Psychiatric Not Present- Anxiety, Bipolar, Change in Sleep Pattern, Depression, Fearful and Frequent crying. Endocrine Present- Cold Intolerance. Not Present- Excessive Hunger, Hair Changes, Heat Intolerance, Hot flashes and New Diabetes. Hematology Present- Blood Thinners. Not Present- Easy Bruising, Excessive bleeding, Gland problems, HIV and Persistent Infections.  Vitals  Weight: 204.2 lb Height: 62.5in Body Surface Area: 1.94 m Body Mass Index: 36.75 kg/m  Temp.: 97.30F  Pulse: 72 (Regular)  BP: 136/70 (Sitting, Left Arm, Standard)       Physical Exam  The physical exam findings are as follows: Note:General: Alert, moderately obese Caucasian female, in no distress Skin: Warm and dry without rash or infection. HEENT: There is fullness palpable in the lower left thyroid gland is difficult to discern a distinct nodule.. Sclera nonicteric. Pupils equal round  and reactive. Oropharynx clear. Lymph nodes: No cervical, supraclavicular, nodes palpable. Lungs: Breath sounds clear and equal. No wheezing or increased work of breathing. Cardiovascular: Regular rate and rhythm without murmer. No JVD or edema. Peripheral pulses intact. No carotid bruits. Abdomen: Nondistended. Soft and nontender. No masses palpable. No organomegaly. No palpable hernias. Port site is unremarkable. Extremities: No edema or joint swelling or deformity. No chronic venous stasis changes. Neurologic: Alert and fully oriented. Gait normal. No focal weakness. Psychiatric: Normal mood and affect. Thought content appropriate with normal judgement and insight    Assessment & Plan Marland Kitchen T. Chisom Aust MD; 11/01/2016 3:46 PM) FITTING AND ADJUSTMENT OF GASTRIC LAP BAND (Z46.51) Impression: Status post LAP-BAND January 2012. Moderate weight loss of 31 pounds. BMI 36.7. It seems she could use some additional restriction and we elected to go ahead with a fill today.  Her band was accessed without difficulty and clear saline returned and I added 0.25 cc to bring her up to expected volume of 4.5 cc. Strategies were reviewed. LEFT THYROID NODULE (E04.1) Impression: 3.5 cm suspicious appearing left thyroid nodule with fine-needle aspiration showing class for cytology suspicious for follicular variant of papillary carcinoma. With these findings I recommended proceeding with total thyroidectomy. We discussed the findings in detail as well as the nature of surgery and expected recovery. We discussed risks of surgery including bleeding, infection, wound healing problems and specific risks of injury to the recurrent laryngeal nerves with per minute hoarseness and injury to parathyroid glands with hypocalcemia. She was given literature regarding the procedure. We will need to obtain cardiac clearance and she will of course need to be off of her Elaquis Current Plans Lap Band Adjustment (LBA) USE FOR ALL  PAYERS Total thyroidectomy under general anesthesia with overnight hospitalization.

## 2016-12-17 NOTE — Transfer of Care (Signed)
Immediate Anesthesia Transfer of Care Note  Patient: Elaine Jackson  Procedure(s) Performed: Procedure(s): TOTAL THYROIDECTOMY (N/A)  Patient Location: PACU  Anesthesia Type:General  Level of Consciousness: sedated  Airway & Oxygen Therapy: Patient Spontanous Breathing and Patient connected to face mask oxygen  Post-op Assessment: Report given to RN and Post -op Vital signs reviewed and stable  Post vital signs: Reviewed and stable  Last Vitals:  Vitals:   12/17/16 0537  BP: (!) 134/41  Pulse: (!) 54  Resp: 16  Temp: 36.5 C    Last Pain:  Vitals:   12/17/16 0537  TempSrc: Oral      Patients Stated Pain Goal: 4 (03/75/43 6067)  Complications: No apparent anesthesia complications

## 2016-12-17 NOTE — Interval H&P Note (Signed)
History and Physical Interval Note:  12/17/2016 7:29 AM  Elaine Jackson  has presented today for surgery, with the diagnosis of thyroid nodule  The various methods of treatment have been discussed with the patient and family. After consideration of risks, benefits and other options for treatment, the patient has consented to  Procedure(s): TOTAL THYROIDECTOMY (N/A) as a surgical intervention .  The patient's history has been reviewed, patient examined, no change in status, stable for surgery.  I have reviewed the patient's chart and labs.  Questions were answered to the patient's satisfaction.     Shaunie Boehm T

## 2016-12-17 NOTE — Op Note (Signed)
Preoperative Diagnosis: thyroid nodule  Postoprative Diagnosis: thyroid nodule  Procedure: Procedure(s): TOTAL THYROIDECTOMY   Surgeon: Excell Seltzer T   Assistants: Gurney Maxin  Anesthesia:  General endotracheal anesthesia  Indications: Patient is a 61 year old female who incidentally had a left thyroid nodule noted at the time of a CT angiogram post-mild CVA. Ultrasound showed a 3.5 cm mass in the left thyroid lobe. Fine-needle aspiration was read as suspicious class IV with concern for follicular variant of papillary carcinoma. With these findings after preoperative discussion regarding indications and risks detailed elsewhere we have elected to proceed with total thyroidectomy.    Procedure Detail:  Patient was brought to the operating room, placed in the supine position on the operating table, and general endotracheal anesthesia induced. PAS were placed. She received preoperative antibiotics. The neck was widely sterilely prepped and draped. Patient timeout was performed and a curvilinear incision was used in the skin crease midway between the sternal notch and thyroid cartilage and dissection was carried down through the subcutaneous tissue and platysma using cautery. Subplatysmal flaps were then raised superiorly up to the thyroid cartilage, inferiorly down to the sternal notch and laterally out toward the sternocleidomastoid muscles.  The Mahorner retractor was placed with good exposure. The strap muscles were then separated in the midline with cautery dissection carried down to the anterior thyroid capsule. The left side was dissected initially. Careful blunt dissection was used to mobilize the strap muscles completely off the anterior thyroid. There was a palpable nodule that felt fairly soft in the left thyroid lobe. Using blunt and harmonic dissection the thyroid was further mobilized superiorly and inferiorly. Staying on the gland small inferior vessels were divided with the  Harmonic scalpel in the lower pole mobilized along the midline. The middle thyroid vein was dissected and divided between a clip and harmonic scalpel. Using careful blunt dissection the upper pole was thoroughly dissected and mobilized. The upper pole vascular pedicle was then divided very near the gland with a  proximal medium clips and the Harmonic scalpel. The gland was then carefully mobilized up out of the tracheoesophageal groove. Individual branches of the inferior thyroid artery were identified and carefully dissected close to the gland and divided with a proximal baby clip and the Harmonic scalpel. As the dissection progressed the recurrent laryngeal nerve was clearly identified and dissection was kept anterior to this. I felt that we very likely saw and preserved the superior parathyroid gland on this side. The gland was mobilized up onto the trachea away from the nerve and then further mobilized to the midline with the electrocautery. Following this an identical dissection was performed on the right side. There were no palpable abnormalities. The dissection progressed in an identical fashion. Again I felt that I saw and preserved the superior parathyroid gland and quite possibly the inferior on this side as well. Again the recurrent laryngeal nerve was very well visualized and protected throughout the dissection. Final attachments along the trachea were divided and the gland was removed intact, oriented with a suture and sent for permanent pathology. A tiny bit of the tubercle of Zuckerkandl was left over the recurrent laryngeal nerve on the right. The patient's neck was relatively thin and I did not feel any lymphadenopathy or really identify any lymph nodes. The neck was irrigated and complete hemostasis assured. Fibrillar was left in the thyroid bed and over the trachea. The strap muscles were closed in the midline with interrupted 3-0 Vicryl. Platysma was closed with interrupted 3-0 Vicryl  and the  skin closed with running subcuticular 5-0 Monocryl and Steri-Strips. Sponge needle and instrument counts were correct.    Findings: As above  Estimated Blood Loss:  Minimal         Drains: None  Blood Given: none          Specimens: Total thyroidectomy        Complications:  * No complications entered in OR log *         Disposition: PACU - hemodynamically stable.         Condition: stable

## 2016-12-18 ENCOUNTER — Other Ambulatory Visit (HOSPITAL_COMMUNITY): Payer: Self-pay | Admitting: *Deleted

## 2016-12-18 DIAGNOSIS — D34 Benign neoplasm of thyroid gland: Secondary | ICD-10-CM | POA: Diagnosis not present

## 2016-12-18 LAB — BASIC METABOLIC PANEL
ANION GAP: 7 (ref 5–15)
BUN: 19 mg/dL (ref 6–20)
CALCIUM: 8.7 mg/dL — AB (ref 8.9–10.3)
CHLORIDE: 106 mmol/L (ref 101–111)
CO2: 25 mmol/L (ref 22–32)
Creatinine, Ser: 0.76 mg/dL (ref 0.44–1.00)
GFR calc non Af Amer: >60 mL/min (ref >60)
GLUCOSE: 166 mg/dL — AB (ref 65–99)
Potassium: 4.2 mmol/L (ref 3.5–5.1)
Sodium: 138 mmol/L (ref 135–145)

## 2016-12-18 LAB — TROPONIN I: Troponin I: <0.03 ng/mL (ref <0.03)

## 2016-12-18 LAB — HEMOGLOBIN A1C
Hgb A1c MFr Bld: 6.3 % — ABNORMAL HIGH (ref 4.8–5.6)
Mean Plasma Glucose: 134 mg/dL

## 2016-12-18 LAB — CALCIUM, IONIZED: Calcium, Ionized, Serum: 4.9 mg/dL (ref 4.5–5.6)

## 2016-12-18 LAB — GLUCOSE, CAPILLARY: Glucose-Capillary: 158 mg/dL — ABNORMAL HIGH (ref 65–99)

## 2016-12-18 MED ORDER — OXYCODONE HCL 5 MG PO TABS: 5.0000 mg | ORAL_TABLET | ORAL | 0 refills | Status: DC | PRN

## 2016-12-18 MED ORDER — CALCIUM CARBONATE ANTACID 500 MG PO CHEW: 2.0000 | CHEWABLE_TABLET | Freq: Three times a day (TID) | ORAL | 1 refills | Status: DC

## 2016-12-18 MED ORDER — APIXABAN 5 MG PO TABS: 5.0000 mg | ORAL_TABLET | Freq: Two times a day (BID) | ORAL | 2 refills | Status: DC

## 2016-12-18 MED ORDER — LEVOTHYROXINE SODIUM 100 MCG PO TABS: 100.0000 ug | ORAL_TABLET | Freq: Every day | ORAL | 1 refills | Status: DC

## 2016-12-18 NOTE — Progress Notes (Signed)
Patient ID: RAYNI NEMITZ, female   DOB: 10-Sep-1955, 61 y.o.   MRN: [id number] 1 Day Post-Op   Subjective: No complaints this morning.  Objective: Vital signs in last 24 hours: Temp:  [97.7 F (36.5 C)-98.3 F (36.8 C)] 98.3 F (36.8 C) (06/26 2154) Pulse Rate:  [54-80] 76 (06/26 2154) Resp:  [10-19] 12 (06/26 2154) BP: (130-187)/(51-103) 130/51 (06/26 2154) SpO2:  [95 %-100 %] 95 % (06/26 2154) Last BM Date: 12/16/16  Intake/Output from previous day: 06/26 0701 - 06/27 0700 In: 1465 [I.V.:1465] Out: 620 [Urine:600; Blood:20] Intake/Output this shift: No intake/output data recorded.  General appearance: alert, cooperative, no distress and Voice normal Incision/Wound: No erythema drainage, swelling or bruising  Lab Results:  No results for input(s): WBC, HGB, HCT, PLT in the last 72 hours. BMET No results for input(s): NA, K, CL, CO2, GLUCOSE, BUN, CREATININE, CALCIUM in the last 72 hours.   Studies/Results: No results found.  Anti-infectives: Anti-infectives    Start     Dose/Rate Route Frequency Ordered Stop   12/17/16 0641  ceFAZolin (ANCEF) 2-4 GM/100ML-% IVPB    Comments:  Kaydie, Petsch   : cabinet override      12/17/16 0641 12/17/16 0742   12/17/16 0548  ceFAZolin (ANCEF) IVPB 2g/100 mL premix     2 g 200 mL/hr over 30 Minutes Intravenous On call to O.R. 12/17/16 7092 12/17/16 0742      Assessment/Plan: s/p Procedure(s): TOTAL THYROIDECTOMY Doing well postoperatively without apparent complication. Plan discharge today after results of Bmet   LOS: 1 day    Truxton Stupka T 12/18/2016

## 2016-12-18 NOTE — Discharge Instructions (Signed)
[facility]      [facility] °[phone] ° °THYROID/ PARATHYROID SURGERY: POST OP INSTRUCTIONS ° °Always review your discharge instruction sheet given to you by the facility where your surgery was performed. ° °IF YOU HAVE DISABILITY OR FAMILY LEAVE FORMS, YOU MUST BRING THEM TO THE OFFICE FOR PROCESSING.  PLEASE DO NOT GIVE THEM TO YOUR DOCTOR. ° °1. A prescription for pain medication may be given to you upon discharge.  Take your pain medication as prescribed, if needed.  If narcotic pain medicine is not needed, then you may take acetaminophen (Tylenol) or ibuprofen (Advil) as needed. °2. Take your usually prescribed medications unless otherwise directed. °3. If you need a refill on your pain medication, please contact your pharmacy. They will contact our office to request authorization.  Prescriptions will not be filled after 5pm or on week-ends. °4. You should follow a light diet the first 24 hours after arrival home, such as soup and crackers, etc.  Be sure to include lots of fluids daily.  Resume your normal diet the day after surgery. °5. Most patients will experience some swelling and bruising on the chest and neck area.  Ice packs will help.  Swelling and bruising can take several days to resolve.  °6. It is common to experience some constipation if taking pain medication after surgery.  Increasing fluid intake and taking a stool softener will usually help or prevent this problem from occurring.  A mild laxative (Milk of Magnesia or Miralax) should be taken according to package directions if there are no bowel movements after 48 hours. °7. Unless discharge instructions indicate otherwise, you may remove your bandages 24-48 hours after surgery, and you may shower at that time.  You may have steri-strips (small skin tapes) in place directly over the incision.  These strips should be left on the skin for 7-10 days.  If your surgeon used skin glue on the incision, you may shower in 24 hours.  The  glue will flake off over the next 2-3 weeks.  Any sutures or staples will be removed at the office during your follow-up visit. °8. ACTIVITIES:  You may resume regular (light) daily activities beginning the next day--such as daily self-care, walking, climbing stairs--gradually increasing activities as tolerated.  You may have sexual intercourse when it is comfortable.  Refrain from any heavy lifting or straining until approved by your doctor. °a. You may drive when you no longer are taking prescription pain medication, you can comfortably wear a seatbelt, and you can safely maneuver your car and apply brakes °b. RETURN TO WORK:  __________________________________________________________ °9. You should see your doctor in the office for a follow-up appointment approximately two weeks after your surgery.  Make sure that you call for this appointment within a day or two after you arrive home to insure a convenient appointment time. °10. OTHER INSTRUCTIONS: ____________________________________________________________________________ _________________________________________________________________________________________________________________ °_________________________________________________________________________________________________________________ ° ° °WHEN TO CALL YOUR DOCTOR: °1. Fever over 101.0 °2. Inability to urinate °3. Nausea and/or vomiting °4. Extreme swelling or bruising °5. Continued bleeding from incision. °6. Increased pain, redness, or drainage from the incision. °7. Difficulty swallowing or breathing °8. Muscle cramping or spasms. °9. Numbness or tingling in hands or feet or around lips. ° °The clinic staff is available to answer your questions during regular business hours.  Please don’t hesitate to call and ask to speak to one of the nurses if you have concerns. ° °For further questions, please visit [email or web address] °

## 2016-12-18 NOTE — Discharge Summary (Signed)
  Patient ID: Elaine Jackson [id number] 60 y.o. Nov 10, 1955  12/17/2016  Discharge date and time: 12/18/2016   Admitting Physician: Excell Seltzer T  Discharge Physician: Excell Seltzer T  Admission Diagnoses: thyroid nodule  Discharge Diagnoses: Same  Operations: Procedure(s): TOTAL THYROIDECTOMY  Admission Condition: good  Discharged Condition: good  Indication for Admission: Patient had a recent CT angiogram and workup for recent mild CVA which noted a suspicious left thyroid nodule. Ultrasound revealed a 3.5 cm somewhat suspicious appearing left thyroid nodule. Subsequent fine-needle aspiration has shown suspicious class for cytology of concern for follicular variant of papillary carcinoma. With these findings and after preoperative discussion regarding indications and risks detailed elsewhere she is electively admitted for total thyroidectomy.  Hospital Course: On the morning of admission the patient underwent an uneventful total thyroidectomy. She was observed overnight. There were no apparent complications. The following morning she feels well. Voice is normal. Calcium this morning is pending prior to discharge.    Disposition: Home  Patient Instructions:  Allergies as of 12/18/2016      Reactions   Sulfa Antibiotics Nausea And Vomiting   Zofran [ondansetron Hcl] Other (See Comments)   Oral numbness with IV dosing.       Medication List    TAKE these medications   apixaban 5 MG Tabs tablet Commonly known as:  ELIQUIS Take 1 tablet (5 mg total) by mouth 2 (two) times daily.   atorvastatin 80 MG tablet Commonly known as:  LIPITOR TAKE 1 TABLET (80 MG TOTAL) BY MOUTH DAILY AT 6 PM.   azelastine 0.1 % nasal spray Commonly known as:  ASTELIN Place 2 sprays into both nostrils 2 (two) times daily. Use in each nostril as directed   calcium carbonate 500 MG chewable tablet Commonly known as:  TUMS - dosed in mg elemental calcium Chew 2 tablets (400 mg of  elemental calcium total) by mouth 3 (three) times daily with meals.   cholecalciferol 1000 units tablet Commonly known as:  VITAMIN D Take 1 tablet (1,000 Units total) by mouth daily.   diltiazem 30 MG tablet Commonly known as:  CARDIZEM Take 1 tablet every 4 hours AS NEEDED for rapid afib heart rate >100   fluticasone 50 MCG/ACT nasal spray Commonly known as:  FLONASE Place 2 sprays into both nostrils daily as needed for allergies.   levothyroxine 100 MCG tablet Commonly known as:  SYNTHROID, LEVOTHROID Take 1 tablet (100 mcg total) by mouth daily before breakfast.   montelukast 10 MG tablet Commonly known as:  SINGULAIR TAKE 1 TABLET (10 MG TOTAL) BY MOUTH AT BEDTIME. What changed:  how much to take  how to take this  when to take this  additional instructions   MUCINEX DM MAXIMUM STRENGTH 60-1200 MG Tb12 Take 1 tablet by mouth every 12 (twelve) hours.   oxyCODONE 5 MG immediate release tablet Commonly known as:  Oxy IR/ROXICODONE Take 1 tablet (5 mg total) by mouth every 4 (four) hours as needed for moderate pain.   triamcinolone cream 0.1 % Commonly known as:  KENALOG Apply 1 application topically 2 (two) times daily.   VITAMIN B-12 PO Take 1 tablet by mouth daily.       Activity: activity as tolerated Diet: Low carbohydrate, lap band diet Wound Care: none needed  Follow-up:  With Dr. Excell Seltzer in 3 weeks.  Signed: Edward Jolly MD, FACS  12/18/2016, 7:53 AM

## 2016-12-19 LAB — CALCIUM, IONIZED: Calcium, Ionized, Serum: 4.9 mg/dL (ref 4.5–5.6)

## 2016-12-23 ENCOUNTER — Ambulatory Visit (INDEPENDENT_AMBULATORY_CARE_PROVIDER_SITE_OTHER): Payer: BLUE CROSS/BLUE SHIELD | Admitting: *Deleted

## 2016-12-23 DIAGNOSIS — I639 Cerebral infarction, unspecified: Secondary | ICD-10-CM

## 2016-12-23 NOTE — Progress Notes (Signed)
Carelink Summary Report / Loop Recorder 

## 2016-12-24 ENCOUNTER — Ambulatory Visit (INDEPENDENT_AMBULATORY_CARE_PROVIDER_SITE_OTHER): Payer: BLUE CROSS/BLUE SHIELD | Admitting: Endocrinology

## 2016-12-24 ENCOUNTER — Encounter: Payer: Self-pay | Admitting: Endocrinology

## 2016-12-24 DIAGNOSIS — E890 Postprocedural hypothyroidism: Secondary | ICD-10-CM | POA: Diagnosis not present

## 2016-12-24 DIAGNOSIS — E039 Hypothyroidism, unspecified: Secondary | ICD-10-CM | POA: Insufficient documentation

## 2016-12-24 NOTE — Progress Notes (Signed)
Subjective:    Patient ID: Elaine Jackson, female    DOB: 10-May-1956, 61 y.o.   MRN: [id number]  HPI Pt is here for postsurgical hypothyroidism.  She had thyroidectomy 1 week ago.  She takes Ca++ tabs, and vit-D, 2000 units qd.  She says the incision is healing well. She takes synthroid.    Past Medical History:  Diagnosis Date  . Allergic rhinitis, cause unspecified   . Allergy   . CAD (coronary artery disease)    LHC 12/20/2014 showed small to moderate apical infarction due to distal LAD stenosis with reestablished TIMI2 flow. No PCI. Recommended plavix for 1 yr and ASA for life.   . Chicken pox   . Diabetes mellitus   . Family history of breast cancer in first degree relative    sister age 35  . Heart murmur   . Hyperlipidemia   . Hypertension   . Malignant neoplasm skin of face 02/16/2007   Basal Cell Carcinoma  Tafeen.  . Measles   . Obesity, unspecified   . Other chronic nonalcoholic liver disease   . Sleep related leg cramps   . Thyroid nodule    10-2016   . Unspecified vitamin D deficiency   . Vision abnormalities     Past Surgical History:  Procedure Laterality Date  . CARDIAC CATHETERIZATION N/A 12/20/2014   Procedure: Left Heart Cath and Coronary Angiography;  Surgeon: Sanda Klein, MD;  Location: [facility];  Service: Cardiovascular;  Laterality: N/A;  . CHOLECYSTECTOMY    . COLONOSCOPY  06/25/2007   Hung. Normal. Repeat in ten years.  Marland Kitchen LAPAROSCOPIC GASTRIC BANDING  07/03/10   Dr. Sherrin Daisy; [facility]  . LOOP RECORDER INSERTION N/A 08/23/2016   Procedure: Loop Recorder Insertion;  Surgeon: Deboraha Sprang, MD;  Location: [facility];  Service: Cardiovascular;  Laterality: N/A;  . THYROIDECTOMY N/A 12/17/2016   Procedure: TOTAL THYROIDECTOMY;  Surgeon: Excell Seltzer, MD;  Location: [facility];  Service: General;  Laterality: N/A;  . TONSILLECTOMY    . TONSILLECTOMY      Social History   Social History  . Marital status: Married    Spouse  name: N/A  . Number of children: 1  . Years of education: N/A   Occupational History  . 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 Smoking status: Former Smoker    Packs/day: 1.00    Years: 25.00    Types: Cigarettes  . Smokeless tobacco: Never Used     Comment: quit age 57     smoked from 62 to 80 off and on  . Alcohol use No  . Drug use: No  . Sexual activity: Yes    Birth control/ protection: Post-menopausal   Other Topics Concern  . Not on file   Social History Narrative   Marital status:  Married; x 26 years. Second marriage. Happily married; no abuse      Children: one child, one stepson;two grandsons      Lives: with husband, mother in 2017      Employment:  [employer] part-time 25-30 hours per week; current job x 19 years.      Tobacco:  None      Alcohol: rare/minimal.      Drugs:  None       Exercise: none in 2017       Seatbelt:  100%.       Sunscreen: SPF 54.      Guns:  Maybe?    Current Outpatient Prescriptions on File Prior to Visit  Medication Sig Dispense Refill  . apixaban (ELIQUIS) 5 MG TABS tablet Take 1 tablet (5 mg total) by mouth 2 (two) times daily. 180 tablet 2  . atorvastatin (LIPITOR) 80 MG tablet TAKE 1 TABLET (80 MG TOTAL) BY MOUTH DAILY AT 6 PM. 30 tablet 0  . cholecalciferol (VITAMIN D) 1000 units tablet Take 1 tablet (1,000 Units total) by mouth daily.    . Cyanocobalamin (VITAMIN B-12 PO) Take 1 tablet by mouth daily.    Marland Kitchen diltiazem (CARDIZEM) 30 MG tablet Take 1 tablet every 4 hours AS NEEDED for rapid afib heart rate >100 45 tablet 2  . levothyroxine (SYNTHROID, LEVOTHROID) 100 MCG tablet Take 1 tablet (100 mcg total) by mouth daily before breakfast. 30 tablet 1  . montelukast (SINGULAIR) 10 MG tablet TAKE 1 TABLET (10 MG TOTAL) BY MOUTH AT BEDTIME. (Patient taking differently: Take 10 mg by mouth daily. ) 90 tablet 3  . oxyCODONE (OXY IR/ROXICODONE) 5 MG immediate release tablet Take 1 tablet (5 mg total) by mouth every  4 (four) hours as needed for moderate pain. 10 tablet 0  . azelastine (ASTELIN) 0.1 % nasal spray Place 2 sprays into both nostrils 2 (two) times daily. Use in each nostril as directed (Patient not taking: Reported on 12/05/2016) 30 mL 12  . calcium carbonate (TUMS - DOSED IN MG ELEMENTAL CALCIUM) 500 MG chewable tablet Chew 2 tablets (400 mg of elemental calcium total) by mouth 3 (three) times daily with meals. (Patient not taking: Reported on 12/24/2016) 100 tablet 1  . Dextromethorphan-Guaifenesin (MUCINEX DM MAXIMUM STRENGTH) 60-1200 MG TB12 Take 1 tablet by mouth every 12 (twelve) hours. (Patient not taking: Reported on 12/05/2016) 14 each 0  . fluticasone (FLONASE) 50 MCG/ACT nasal spray Place 2 sprays into both nostrils daily as needed for allergies.   11  . triamcinolone cream (KENALOG) 0.1 % Apply 1 application topically 2 (two) times daily. (Patient not taking: Reported on 12/05/2016) 45 g 0   No current facility-administered medications on file prior to visit.     Allergies  Allergen Reactions  . Sulfa Antibiotics Nausea And Vomiting  . Zofran [Ondansetron Hcl] Other (See Comments)    Oral numbness with IV dosing.     Family History  Problem Relation Age of Onset  . Diabetes Mother   . Heart disease Mother        defibrillator; carotid artery stenosis.  . Hypertension Mother   . Hyperlipidemia Mother   . Dementia Mother   . Congestive Heart Failure Mother   . Heart disease Father   . Kidney failure Father        ESRD/peritoneal dialysis  . Diabetes Father   . Hypertension Father   . Hyperlipidemia Father   . Stroke Father   . Kidney disease Father   . Congestive Heart Failure Father   . Diabetes Sister   . Hypertension Sister   . Kidney disease Sister        renal failure  . Diabetes Brother   . Hypertension Brother   . Hyperlipidemia Brother   . Arthritis Brother   . Cervical cancer Sister   . Hypertension Sister   . Liver cancer Sister 10  . Stroke Maternal  Grandmother   . Heart disease Maternal Grandfather   . Stroke Paternal Grandmother   . Heart disease Paternal Grandmother   . Heart disease Paternal Grandfather   . Stroke Paternal Grandfather   .  Breast cancer Sister 16  . Hepatitis C Sister 34  . Thyroid disease Neg Hx     BP 134/76   Pulse (!) 55   Ht 5' 1.5" (1.562 m)   Wt 203 lb (92.1 kg)   SpO2 97%   BMI 37.74 kg/m   Review of Systems Denies cramps.      Objective:   Physical Exam VITAL SIGNS:  See vs page GENERAL: no distress NECK: healing surgical scar.   Pathol: FOLLICULAR ADENOMA WITH FOCAL DEGENERATIVE CHANGES.     Assessment & Plan:  Postsurgical hypothyroidism, new to me.   Patient Instructions  Please continue the same medications for now. Please come back next week, for blood tests.  You do not need to be fasting If your calcuim is low, it is almost always temporary.   I would be happy to see you back here as needed.

## 2016-12-24 NOTE — Patient Instructions (Addendum)
Please continue the same medications for now. Please come back next week, for blood tests.  You do not need to be fasting If your calcuim is low, it is almost always temporary.   I would be happy to see you back here as needed.

## 2017-01-01 LAB — HM DIABETES EYE EXAM

## 2017-01-03 ENCOUNTER — Other Ambulatory Visit (INDEPENDENT_AMBULATORY_CARE_PROVIDER_SITE_OTHER): Payer: BLUE CROSS/BLUE SHIELD

## 2017-01-03 DIAGNOSIS — E890 Postprocedural hypothyroidism: Secondary | ICD-10-CM

## 2017-01-03 LAB — TSH: TSH: 0.68 u[IU]/mL (ref 0.35–4.50)

## 2017-01-03 LAB — VITAMIN D 25 HYDROXY (VIT D DEFICIENCY, FRACTURES): VITD: 32.35 ng/mL (ref 30.00–100.00)

## 2017-01-08 ENCOUNTER — Telehealth: Payer: Self-pay | Admitting: Endocrinology

## 2017-01-08 LAB — CUP PACEART REMOTE DEVICE CHECK
Date Time Interrogation Session: 20180630204933
Implantable Pulse Generator Implant Date: 20180302

## 2017-01-08 NOTE — Telephone Encounter (Signed)
Patient calling to get lab results. Call patient to advise. Okay to leave a detailed message on phone.

## 2017-01-09 LAB — PTH, INTACT AND CALCIUM
CALCIUM: 8.7 mg/dL (ref 8.6–10.4)
PTH: 43 pg/mL (ref 14–64)

## 2017-01-09 NOTE — Telephone Encounter (Signed)
Normal Please continue the same medication. I hope you feel well.

## 2017-01-09 NOTE — Telephone Encounter (Signed)
See message and please advise on labs from 01/03/2017.

## 2017-01-09 NOTE — Telephone Encounter (Signed)
Pateint notified of message and voiced understanding. She had no further questions at this time.

## 2017-01-15 ENCOUNTER — Other Ambulatory Visit: Payer: Self-pay | Admitting: Emergency Medicine

## 2017-01-15 MED ORDER — GLUCOSE BLOOD VI STRP: ORAL_STRIP | 12 refills | Status: DC

## 2017-01-20 ENCOUNTER — Ambulatory Visit (INDEPENDENT_AMBULATORY_CARE_PROVIDER_SITE_OTHER): Payer: BLUE CROSS/BLUE SHIELD | Admitting: *Deleted

## 2017-01-20 DIAGNOSIS — I639 Cerebral infarction, unspecified: Secondary | ICD-10-CM | POA: Diagnosis not present

## 2017-01-21 NOTE — Progress Notes (Signed)
Carelink Summary Report / Loop Recorder 

## 2017-02-01 LAB — CUP PACEART REMOTE DEVICE CHECK
Implantable Pulse Generator Implant Date: 20180302
MDC IDC SESS DTM: 20180730213844

## 2017-02-01 NOTE — Progress Notes (Signed)
Carelink summary report received. Battery status OK. Normal device function. No new symptom episodes, tachy episodes, brady, or pause episodes. 9 AF 0.9% +Eliquis. Monthly summary reports and ROV/PRN

## 2017-02-11 ENCOUNTER — Encounter: Payer: Self-pay | Admitting: Family Medicine

## 2017-02-11 ENCOUNTER — Ambulatory Visit (INDEPENDENT_AMBULATORY_CARE_PROVIDER_SITE_OTHER): Payer: BLUE CROSS/BLUE SHIELD | Admitting: Family Medicine

## 2017-02-11 VITALS — BP 135/79 | HR 62 | Temp 98.0°F | Resp 16 | Ht 62.21 in | Wt 200.0 lb

## 2017-02-11 DIAGNOSIS — I639 Cerebral infarction, unspecified: Secondary | ICD-10-CM

## 2017-02-11 DIAGNOSIS — E118 Type 2 diabetes mellitus with unspecified complications: Secondary | ICD-10-CM | POA: Diagnosis not present

## 2017-02-11 DIAGNOSIS — I2510 Atherosclerotic heart disease of native coronary artery without angina pectoris: Secondary | ICD-10-CM | POA: Diagnosis not present

## 2017-02-11 DIAGNOSIS — E890 Postprocedural hypothyroidism: Secondary | ICD-10-CM

## 2017-02-11 DIAGNOSIS — I480 Paroxysmal atrial fibrillation: Secondary | ICD-10-CM | POA: Diagnosis not present

## 2017-02-11 DIAGNOSIS — E785 Hyperlipidemia, unspecified: Secondary | ICD-10-CM

## 2017-02-11 DIAGNOSIS — I252 Old myocardial infarction: Secondary | ICD-10-CM | POA: Diagnosis not present

## 2017-02-11 DIAGNOSIS — I10 Essential (primary) hypertension: Secondary | ICD-10-CM

## 2017-02-11 LAB — POCT URINALYSIS DIP (MANUAL ENTRY)
Glucose, UA: NEGATIVE mg/dL
NITRITE UA: NEGATIVE
PH UA: 5.5 (ref 5.0–8.0)
RBC UA: NEGATIVE
Spec Grav, UA: 1.025 (ref 1.010–1.025)
UROBILINOGEN UA: 1 U/dL

## 2017-02-11 MED ORDER — POTASSIUM CHLORIDE CRYS ER 20 MEQ PO TBCR: 20.0000 meq | EXTENDED_RELEASE_TABLET | Freq: Every day | ORAL | 0 refills | Status: DC

## 2017-02-11 MED ORDER — FUROSEMIDE 20 MG PO TABS: 20.0000 mg | ORAL_TABLET | Freq: Every day | ORAL | 0 refills | Status: DC

## 2017-02-11 MED ORDER — ZOSTER VAC RECOMB ADJUVANTED 50 MCG/0.5ML IM SUSR: 0.5000 mL | Freq: Once | INTRAMUSCULAR | 1 refills | Status: AC

## 2017-02-11 NOTE — Progress Notes (Signed)
Subjective:    Patient ID: Elaine Jackson, female    DOB: 1955-07-13, 61 y.o.   MRN: [id number]  02/11/2017  Hyperlipidemia (3 month follow-up); Hypertension; and Diabetes   HPI This 61 y.o. female presents for three month follow-up of DMII, hypertension, hypercholesterolemia, CAD, history of CVA, and recent dx benign thyroid pathology s/p thyroidectomy.     S/p thyroidectomy; pathology negative; no evidence of thyroid cancer.  There was a 75% chance of cancer; grading system is IV.  Pathology negative.  Insurance is denying payment.  There was a little obstruction in throat; now gone; did not realize thyroid nodule.    No recurrent atrial fibrillation since changing blood thinner.    Feet swelling some and hands. Couple of times have felt weird; sick headache and out of aura; took a cardizem with improvement.  First time took it was in [place].  Had same symptoms as when had AMI; took Cardizem; urinated all night long.  Just a weird feeling.       BP Readings from Last 3 Encounters:  02/11/17 135/79  12/24/16 134/76  12/18/16 (!) 145/73   Wt Readings from Last 3 Encounters:  02/11/17 200 lb (90.7 kg)  12/24/16 203 lb (92.1 kg)  12/17/16 203 lb (92.1 kg)   Immunization History  Administered Date(s) Administered  . Hepatitis A 08/13/2010, 03/20/2011  . Hepatitis B 06/24/2010, 08/13/2010, 12/03/2010, 03/20/2011  . Influenza Split 03/20/2011, 03/02/2012  . Influenza,inj,Quad PF,36+ Mos 04/28/2013, 03/14/2014, 05/01/2016  . Influenza-Unspecified 04/19/2015  . Pneumococcal Polysaccharide-23 06/06/2014  . Pneumococcal-Unspecified 07/18/2008  . Td 11/22/2001  . Tdap 09/02/2011  . Zoster 06/16/2015    Review of Systems  Constitutional: Negative for chills, diaphoresis, fatigue and fever.  Eyes: Positive for visual disturbance. Negative for photophobia.  Respiratory: Negative for cough and shortness of breath.   Cardiovascular: Positive for leg swelling. Negative for chest pain  and palpitations.  Gastrointestinal: Negative for abdominal pain, constipation, diarrhea, nausea and vomiting.  Endocrine: Negative for cold intolerance, heat intolerance, polydipsia, polyphagia and polyuria.  Neurological: Negative for dizziness, tremors, seizures, syncope, facial asymmetry, speech difficulty, weakness, light-headedness, numbness and headaches.  Psychiatric/Behavioral: Negative for dysphoric mood. The patient is not nervous/anxious.     Past Medical History:  Diagnosis Date  . Allergic rhinitis, cause unspecified   . Allergy   . CAD (coronary artery disease)    LHC 12/20/2014 showed small to moderate apical infarction due to distal LAD stenosis with reestablished TIMI2 flow. No PCI. Recommended plavix for 1 yr and ASA for life.   . Chicken pox   . Diabetes mellitus   . Family history of breast cancer in first degree relative    sister age 39  . Heart murmur   . Hyperlipidemia   . Hypertension   . Malignant neoplasm skin of face 02/16/2007   Basal Cell Carcinoma  Tafeen.  . Measles   . Obesity, unspecified   . Other chronic nonalcoholic liver disease   . Sleep related leg cramps   . Thyroid nodule    10-2016   . Unspecified vitamin D deficiency   . Vision abnormalities    Past Surgical History:  Procedure Laterality Date  . CARDIAC CATHETERIZATION N/A 12/20/2014   Procedure: Left Heart Cath and Coronary Angiography;  Surgeon: Sanda Klein, MD;  Location: [facility];  Service: Cardiovascular;  Laterality: N/A;  . CHOLECYSTECTOMY    . COLONOSCOPY  06/25/2007   Hung. Normal. Repeat in ten years.  Marland Kitchen LAPAROSCOPIC GASTRIC  BANDING  07/03/10   Dr. Sherrin Daisy; [facility]  . LOOP RECORDER INSERTION N/A 08/23/2016   Procedure: Loop Recorder Insertion;  Surgeon: Deboraha Sprang, MD;  Location: [facility];  Service: Cardiovascular;  Laterality: N/A;  . THYROIDECTOMY N/A 12/17/2016   Procedure: TOTAL THYROIDECTOMY;  Surgeon: Excell Seltzer, MD;  Location:  [facility];  Service: General;  Laterality: N/A;  . TONSILLECTOMY    . TONSILLECTOMY     Allergies  Allergen Reactions  . Sulfa Antibiotics Nausea And Vomiting  . Zofran [Ondansetron Hcl] Other (See Comments)    Oral numbness with IV dosing.    Current Outpatient Prescriptions  Medication Sig Dispense Refill  . apixaban (ELIQUIS) 5 MG TABS tablet Take 1 tablet (5 mg total) by mouth 2 (two) times daily. 180 tablet 2  . atorvastatin (LIPITOR) 80 MG tablet TAKE 1 TABLET (80 MG TOTAL) BY MOUTH DAILY AT 6 PM. 30 tablet 0  . cholecalciferol (VITAMIN D) 1000 units tablet Take 1 tablet (1,000 Units total) by mouth daily.    . Cyanocobalamin (VITAMIN B-12 PO) Take 1 tablet by mouth daily.    Marland Kitchen diltiazem (CARDIZEM) 30 MG tablet Take 1 tablet every 4 hours AS NEEDED for rapid afib heart rate >100 45 tablet 2  . fluticasone (FLONASE) 50 MCG/ACT nasal spray Place 2 sprays into both nostrils daily as needed for allergies.   11  . glucose blood test strip Use as instructed 100 each 12  . levothyroxine (SYNTHROID, LEVOTHROID) 100 MCG tablet Take 1 tablet (100 mcg total) by mouth daily before breakfast. 30 tablet 1  . montelukast (SINGULAIR) 10 MG tablet TAKE 1 TABLET (10 MG TOTAL) BY MOUTH AT BEDTIME. (Patient taking differently: Take 10 mg by mouth daily. ) 90 tablet 3  . furosemide (LASIX) 20 MG tablet Take 1 tablet (20 mg total) by mouth daily. 90 tablet 0  . potassium chloride SA (K-DUR,KLOR-CON) 20 MEQ tablet Take 1 tablet (20 mEq total) by mouth daily. 90 tablet 0  . Zoster Vac Recomb Adjuvanted [facility]) injection Inject 0.5 mLs into the muscle once. 0.5 mL 1   No current facility-administered medications for this visit.    Social History   Social History  . Marital status: Married    Spouse name: N/A  . Number of children: 1  . Years of education: N/A   Occupational History  . 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 Smoking status: Former Smoker    Packs/day: 1.00     Years: 25.00    Types: Cigarettes  . Smokeless tobacco: Never Used     Comment: quit age 76     smoked from 87 to 42 off and on  . Alcohol use No  . Drug use: No  . Sexual activity: Yes    Birth control/ protection: Post-menopausal   Other Topics Concern  . Not on file   Social History Narrative   Marital status:  Married; x 26 years. Second marriage. Happily married; no abuse      Children: one child, one stepson;two grandsons      Lives: with husband, mother in 2017      Employment:  [employer] part-time 25-30 hours per week; current job x 19 years.      Tobacco:  None      Alcohol: rare/minimal.      Drugs:  None       Exercise: none in 2017       Seatbelt:  100%.       Sunscreen: SPF 15.      Guns:  Maybe?   Family History  Problem Relation Age of Onset  . Diabetes Mother   . Heart disease Mother        defibrillator; carotid artery stenosis.  . Hypertension Mother   . Hyperlipidemia Mother   . Dementia Mother   . Congestive Heart Failure Mother   . Heart disease Father   . Kidney failure Father        ESRD/peritoneal dialysis  . Diabetes Father   . Hypertension Father   . Hyperlipidemia Father   . Stroke Father   . Kidney disease Father   . Congestive Heart Failure Father   . Diabetes Sister   . Hypertension Sister   . Kidney disease Sister        renal failure  . Diabetes Brother   . Hypertension Brother   . Hyperlipidemia Brother   . Arthritis Brother   . Cervical cancer Sister   . Hypertension Sister   . Liver cancer Sister 87  . Stroke Maternal Grandmother   . Heart disease Maternal Grandfather   . Stroke Paternal Grandmother   . Heart disease Paternal Grandmother   . Heart disease Paternal Grandfather   . Stroke Paternal Grandfather   . Breast cancer Sister 17  . Hepatitis C Sister 63  . Thyroid disease Neg Hx        Objective:    BP 135/79   Pulse 62   Temp 98 F (36.7 C) (Oral)   Resp 16   Ht 5' 2.21" (1.58 m)   Wt 200 lb (90.7  kg)   SpO2 98%   BMI 36.34 kg/m  Physical Exam  Constitutional: She is oriented to person, place, and time. She appears well-developed and well-nourished. No distress.  HENT:  Head: Normocephalic and atraumatic.  Right Ear: External ear normal.  Left Ear: External ear normal.  Nose: Nose normal.  Mouth/Throat: Oropharynx is clear and moist.  Eyes: Pupils are equal, round, and reactive to light. Conjunctivae and EOM are normal.  Neck: Normal range of motion. Neck supple. Carotid bruit is not present. No thyromegaly present.  Cardiovascular: Normal rate, regular rhythm, normal heart sounds and intact distal pulses.  Exam reveals no gallop and no friction rub.   No murmur heard. Pulmonary/Chest: Effort normal and breath sounds normal. She has no wheezes. She has no rales.  Abdominal: Soft. Bowel sounds are normal. She exhibits no distension and no mass. There is no tenderness. There is no rebound and no guarding.  Lymphadenopathy:    She has no cervical adenopathy.  Neurological: She is alert and oriented to person, place, and time. No cranial nerve deficit.  Skin: Skin is warm and dry. No rash noted. She is not diaphoretic. No erythema. No pallor.  Psychiatric: She has a normal mood and affect. Her behavior is normal.   Results for orders placed or performed in visit on 01/20/17  CUP PACEART REMOTE DEVICE CHECK  Result Value Ref Range   Date Time Interrogation Session [id number]    Pulse Generator Manufacturer MERM    Pulse Gen Model EXH37 Reveal LINQ    Pulse Gen Serial Number [id number]    Clinic Name [facility]    Implantable Pulse Generator Type ICM/ILR    Implantable Pulse Generator Implant Date [id number]    Eval Rhythm SB    No results found. Depression screen [facility] 2/9 02/11/2017 11/30/2016 11/05/2016 08/28/2016 05/01/2016  Decreased Interest - 0 0 0 0  Down, Depressed, Hopeless 0 0 0 0 0  PHQ - 2 Score 0 0 0 0 0   Fall Risk  02/11/2017 11/30/2016 11/05/2016 09/16/2016  08/28/2016  Falls in the past year? No No No No No  Risk for fall due to : - History of fall(s) - - -        Assessment & Plan:   1. Diabetes mellitus with complication ([place])   2. Postoperative hypothyroidism   3. Essential hypertension, benign   4. Old MI (myocardial infarction)   5. Paroxysmal atrial fibrillation (H[facility])   6. Coronary artery disease involving native coronary artery of native heart without angina pectoris   7. Cerebellar stroke ([place])   8. Dyslipidemia    -stable chronic medical conditions; obtain labs for chronic disease management; continue current medications. -new onset leg swelling in past three months; obtain u/a and labs; rx for Lasix 20mg  daily with KCL provided.   Orders Placed This Encounter  Procedures  . CBC with Differential/Platelet  . Comprehensive metabolic panel    Order Specific Question:   Has the patient fasted?    Answer:   Yes  . Hemoglobin A1c  . Lipid panel    Order Specific Question:   Has the patient fasted?    Answer:   Yes  . POCT urinalysis dipstick   Meds ordered this encounter  Medications  . furosemide (LASIX) 20 MG tablet    Sig: Take 1 tablet (20 mg total) by mouth daily.    Dispense:  90 tablet    Refill:  0  . potassium chloride SA (K-DUR,KLOR-CON) 20 MEQ tablet    Sig: Take 1 tablet (20 mEq total) by mouth daily.    Dispense:  90 tablet    Refill:  0  . Zoster Vac Recomb Adjuvanted (SHINGRIX) injection    Sig: Inject 0.5 mLs into the muscle once.    Dispense:  0.5 mL    Refill:  1    Return in about 3 months (around 05/14/2017) for complete physical examiniation.   Gurveer Colucci Elayne Guerin, M.D. Primary Care at [facility] [address] [place], [place]  [address] [phone] phone [phone] fax

## 2017-02-11 NOTE — Patient Instructions (Signed)
     IF you received an x-ray today, you will receive an invoice from [facility]. Please contact [facility] at [phone] with questions or concerns regarding your invoice.   IF you received labwork today, you will receive an invoice from [facility]. Please contact [facility] at [phone] with questions or concerns regarding your invoice.   Our billing staff will not be able to assist you with questions regarding bills from these companies.  You will be contacted with the lab results as soon as they are available. The fastest way to get your results is to activate your My Chart account. Instructions are located on the last page of this paperwork. If you have not heard from us regarding the results in 2 weeks, please contact this office.     

## 2017-02-12 LAB — CBC WITH DIFFERENTIAL/PLATELET
BASOS ABS: 0 10*3/uL (ref 0.0–0.2)
Basos: 0 %
EOS (ABSOLUTE): 0.1 10*3/uL (ref 0.0–0.4)
Eos: 1 %
Hematocrit: 36.7 % (ref 34.0–46.6)
Hemoglobin: 12 g/dL (ref 11.1–15.9)
Immature Grans (Abs): 0 10*3/uL (ref 0.0–0.1)
Immature Granulocytes: 0 %
LYMPHS ABS: 1.7 10*3/uL (ref 0.7–3.1)
Lymphs: 22 %
MCH: 30.8 pg (ref 26.6–33.0)
MCHC: 32.7 g/dL (ref 31.5–35.7)
MCV: 94 fL (ref 79–97)
MONOS ABS: 0.6 10*3/uL (ref 0.1–0.9)
Monocytes: 7 %
NEUTROS ABS: 5.4 10*3/uL (ref 1.4–7.0)
Neutrophils: 70 %
Platelets: 280 10*3/uL (ref 150–379)
RBC: 3.89 x10E6/uL (ref 3.77–5.28)
RDW: 13.7 % (ref 12.3–15.4)
WBC: 7.7 10*3/uL (ref 3.4–10.8)

## 2017-02-12 LAB — COMPREHENSIVE METABOLIC PANEL
ALK PHOS: 82 IU/L (ref 39–117)
ALT: 29 IU/L (ref 0–32)
AST: 23 IU/L (ref 0–40)
Albumin/Globulin Ratio: 2.1 (ref 1.2–2.2)
Albumin: 4.6 g/dL (ref 3.6–4.8)
BILIRUBIN TOTAL: 0.8 mg/dL (ref 0.0–1.2)
BUN / CREAT RATIO: 16 (ref 12–28)
BUN: 15 mg/dL (ref 8–27)
CHLORIDE: 102 mmol/L (ref 96–106)
CO2: 24 mmol/L (ref 20–29)
CREATININE: 0.96 mg/dL (ref 0.57–1.00)
Calcium: 9.5 mg/dL (ref 8.7–10.3)
GFR calc Af Amer: 74 mL/min/{1.73_m2} (ref >59)
GFR calc non Af Amer: 64 mL/min/{1.73_m2} (ref >59)
GLUCOSE: 107 mg/dL — AB (ref 65–99)
Globulin, Total: 2.2 g/dL (ref 1.5–4.5)
Potassium: 4.6 mmol/L (ref 3.5–5.2)
SODIUM: 140 mmol/L (ref 134–144)
Total Protein: 6.8 g/dL (ref 6.0–8.5)

## 2017-02-12 LAB — LIPID PANEL
CHOL/HDL RATIO: 2.5 ratio (ref 0.0–4.4)
Cholesterol, Total: 130 mg/dL (ref 100–199)
HDL: 53 mg/dL (ref >39)
LDL Calculated: 62 mg/dL (ref 0–99)
TRIGLYCERIDES: 75 mg/dL (ref 0–149)
VLDL Cholesterol Cal: 15 mg/dL (ref 5–40)

## 2017-02-12 LAB — HEMOGLOBIN A1C
ESTIMATED AVERAGE GLUCOSE: 120 mg/dL
HEMOGLOBIN A1C: 5.8 % — AB (ref 4.8–5.6)

## 2017-02-15 ENCOUNTER — Other Ambulatory Visit: Payer: Self-pay | Admitting: Family Medicine

## 2017-02-15 ENCOUNTER — Telehealth: Payer: Self-pay | Admitting: Family Medicine

## 2017-02-15 NOTE — Telephone Encounter (Signed)
Pt called concerning thyroid medication for levothyroxine. She said Dr. Loanne Drilling was monitoring this but she did not think he was going to refill anymore. Pt is a Dr. Tamala Julian pt now. She said she was just seen here the other day but did not mention this. She is down to her last pill today. I did advise her that it could be 48-72 hours for a response to the refill message and that we had appointments today with other doctors if she wanted to come in but she said she was going to contact her pharmacy in the meantime about this after putting the message in. Please advise.

## 2017-02-17 ENCOUNTER — Other Ambulatory Visit: Payer: Self-pay

## 2017-02-17 DIAGNOSIS — E890 Postprocedural hypothyroidism: Secondary | ICD-10-CM

## 2017-02-17 MED ORDER — LEVOTHYROXINE SODIUM 100 MCG PO TABS: 100.0000 ug | ORAL_TABLET | Freq: Every day | ORAL | 3 refills | Status: DC

## 2017-02-17 NOTE — Telephone Encounter (Signed)
Rx refilled today.

## 2017-02-19 ENCOUNTER — Ambulatory Visit (INDEPENDENT_AMBULATORY_CARE_PROVIDER_SITE_OTHER): Payer: BLUE CROSS/BLUE SHIELD | Admitting: *Deleted

## 2017-02-19 DIAGNOSIS — I639 Cerebral infarction, unspecified: Secondary | ICD-10-CM | POA: Diagnosis not present

## 2017-02-20 LAB — CUP PACEART REMOTE DEVICE CHECK
Implantable Pulse Generator Implant Date: 20180302
MDC IDC SESS DTM: 20180830024004

## 2017-02-20 NOTE — Progress Notes (Signed)
Carelink Summary Report / Loop Recorder 

## 2017-03-18 ENCOUNTER — Other Ambulatory Visit: Payer: Self-pay | Admitting: Physician Assistant

## 2017-03-21 ENCOUNTER — Ambulatory Visit (INDEPENDENT_AMBULATORY_CARE_PROVIDER_SITE_OTHER): Payer: BLUE CROSS/BLUE SHIELD | Admitting: *Deleted

## 2017-03-21 DIAGNOSIS — I639 Cerebral infarction, unspecified: Secondary | ICD-10-CM

## 2017-03-22 ENCOUNTER — Other Ambulatory Visit: Payer: Self-pay | Admitting: Family Medicine

## 2017-03-24 NOTE — Progress Notes (Signed)
Carelink Summary Report / Loop Recorder 

## 2017-03-25 LAB — CUP PACEART REMOTE DEVICE CHECK
MDC IDC PG IMPLANT DT: 20180302
MDC IDC SESS DTM: 20180929033943

## 2017-04-21 ENCOUNTER — Ambulatory Visit (INDEPENDENT_AMBULATORY_CARE_PROVIDER_SITE_OTHER): Payer: BLUE CROSS/BLUE SHIELD | Admitting: *Deleted

## 2017-04-21 DIAGNOSIS — I639 Cerebral infarction, unspecified: Secondary | ICD-10-CM | POA: Diagnosis not present

## 2017-04-21 NOTE — Progress Notes (Signed)
Carelink Summary Report / Loop Recorder 

## 2017-04-23 ENCOUNTER — Other Ambulatory Visit: Payer: Self-pay | Admitting: Family Medicine

## 2017-04-24 NOTE — Progress Notes (Signed)
[facility]  PATIENT: Elaine Jackson DOB: 01/01/1956   REASON FOR VISIT:  follow-up for stroke right PICA territory infarcts secondary to large vessel source February 2018 HISTORY FROM: Patient    HISTORY OF PRESENT ILLNESS: UPDATE 11/5/2018CM Elaine Jackson, 61 year old female returns for follow-up with history of stroke event in February 2018.  She is currently on Eliquis for secondary stroke prevention and  atrial fib.  She has not had further stroke or TIA symptoms.  She has minimal bruising and no bleeding. She has a loop recorder.  Blood pressure in the office today 149/76.  She remains on Lipitor without myalgias.  He claims she gets little exercise.  She has a history of CAD and stent.  She denies palpitations.  She had her thyroid removed in June, no complications from surgery.  She continues to follow with cardiology.  She returns for reevaluation   History 10/23/2016 Elaine Jackson, 61 year old female was admitted to the hospital 08/20/2016 after experiencing sudden onset of dizziness and nausea. She had a history of migraine headaches approximately 35 years ago. On arrival she was complaining of a constant headache which was located in the right temporal region. She reported over the past several years she had ocular migraines but no actual headache. She also remembers having a couple of heart palpitations which woke her up from sleep but was told to stop her Plavix by her cardiologist last November and she is only on aspirin on admission. MRI of the brain , stroke right PICA territory infarcts secondary to large vessel source. CTA head and neck right VA tapering to occlusion, right AICA and right PICA is not seen. 2 cm left thyroid nodule LDL 64 hemoglobin A1c 5.8 2-D echo 60-65% Loop recorder was placed on 08/23/2016. She returns to the stroke clinic today for follow-up. She has an appointment with the atrial fib clinic this afternoon. Atrial fibrillation was seen on her Loop  recorder in the last week. She is currently on Plavix and asa for  secondary prevention without further stroke or TIA symptoms. She was made aware she will probably be placed on eliquis. She is on Lipitor for secondary stroke prevention and history of CAD and stent. She denies any palpitations. She denies any residual deficits from her stroke. She feels she is back to baseline. She is exercising by walking 30-45 minutes daily   REVIEW OF SYSTEMS: Full 14 system review of systems performed and notable only for those listed, all others are neg:  Constitutional: neg  Cardiovascular: neg Ear/Nose/Throat: neg  Skin: neg Eyes: neg Respiratory: Cough Gastroitestinal: neg  Hematology/Lymphatic: neg  Endocrine: Intolerance to cold Musculoskeletal:neg Allergy/ImmunoSeasonal allergies Neurological: neg Psychiatric: neg Sleep : neg   ALLERGIES: Allergies  Allergen Reactions  . Sulfa Antibiotics Nausea And Vomiting  . Zofran [Ondansetron Hcl] Other (See Comments)    Oral numbness with IV dosing.     HOME MEDICATIONS: Outpatient Medications Prior to Visit  Medication Sig Dispense Refill  . apixaban (ELIQUIS) 5 MG TABS tablet Take 1 tablet (5 mg total) by mouth 2 (two) times daily. 180 tablet 2  . atorvastatin (LIPITOR) 80 MG tablet TAKE 1 TABLET (80 MG TOTAL) BY MOUTH DAILY AT 6 PM. 30 tablet 0  . cholecalciferol (VITAMIN D) 1000 units tablet Take 1 tablet (1,000 Units total) by mouth daily.    . Cyanocobalamin (VITAMIN B-12 PO) Take 1 tablet by mouth daily.    Marland Kitchen diltiazem (CARDIZEM) 30 MG tablet Take 1 tablet every 4  hours AS NEEDED for rapid afib heart rate >100 45 tablet 2  . fluticasone (FLONASE) 50 MCG/ACT nasal spray Place 2 sprays into both nostrils daily as needed for allergies.   11  . furosemide (LASIX) 20 MG tablet Take 1 tablet (20 mg total) by mouth daily. 90 tablet 0  . glucose blood test strip Use as instructed 100 each 12  . levothyroxine (SYNTHROID, LEVOTHROID) 100 MCG tablet  Take 1 tablet (100 mcg total) by mouth daily before breakfast. 30 tablet 3  . montelukast (SINGULAIR) 10 MG tablet TAKE 1 TABLET BY MOUTH EVERYDAY AT BEDTIME 30 tablet 0  . potassium chloride SA (K-DUR,KLOR-CON) 20 MEQ tablet Take 1 tablet (20 mEq total) by mouth daily. 90 tablet 0   No facility-administered medications prior to visit.     PAST MEDICAL HISTORY: Past Medical History:  Diagnosis Date  . Allergic rhinitis, cause unspecified   . Allergy   . CAD (coronary artery disease)    LHC 12/20/2014 showed small to moderate apical infarction due to distal LAD stenosis with reestablished TIMI2 flow. No PCI. Recommended plavix for 1 yr and ASA for life.   . Chicken pox   . Diabetes mellitus   . Family history of breast cancer in first degree relative    sister age 44  . Heart murmur   . Hyperlipidemia   . Hypertension   . Malignant neoplasm skin of face 02/16/2007   Basal Cell Carcinoma  Elaine Jackson.  . Measles   . Obesity, unspecified   . Other chronic nonalcoholic liver disease   . Sleep related leg cramps   . Thyroid nodule    10-2016   . Unspecified vitamin D deficiency   . Vision abnormalities     PAST SURGICAL HISTORY: Past Surgical History:  Procedure Laterality Date  . CHOLECYSTECTOMY    . COLONOSCOPY  06/25/2007   Elaine Jackson. Normal. Repeat in ten years.  Marland Kitchen LAPAROSCOPIC GASTRIC BANDING  07/03/10   Dr. Sherrin Jackson; Elaine Jackson  . TONSILLECTOMY    . TONSILLECTOMY      FAMILY HISTORY: Family History  Problem Relation Age of Onset  . Diabetes Mother   . Heart disease Mother        defibrillator; carotid artery stenosis.  . Hypertension Mother   . Hyperlipidemia Mother   . Dementia Mother   . Congestive Heart Failure Mother   . Heart disease Father   . Kidney failure Father        ESRD/peritoneal dialysis  . Diabetes Father   . Hypertension Father   . Hyperlipidemia Father   . Stroke Father   . Kidney disease Father   . Congestive Heart Failure Father   . Diabetes  Sister   . Hypertension Sister   . Kidney disease Sister        renal failure  . Diabetes Brother   . Hypertension Brother   . Hyperlipidemia Brother   . Arthritis Brother   . Cervical cancer Sister   . Hypertension Sister   . Liver cancer Sister 57  . Stroke Maternal Grandmother   . Heart disease Maternal Grandfather   . Stroke Paternal Grandmother   . Heart disease Paternal Grandmother   . Heart disease Paternal Grandfather   . Stroke Paternal Grandfather   . Breast cancer Sister 23  . Hepatitis C Sister 76  . Thyroid disease Neg Hx     SOCIAL HISTORY: Social History   Socioeconomic History  . Marital status: Married    Spouse  name: Not on file  . Number of children: 1  . Years of education: Not on file  . Highest education level: Not on file  Social Needs  . Financial resource strain: Not on file  . Food insecurity - worry: Not on file  . Food insecurity - inability: Not on file  . Transportation needs - medical: Not on file  . Transportation needs - non-medical: Not on file  Occupational History  . Occupation: HAIR STYLIST    Employer: [employer]  Tobacco Use  . Smoking status: Former Smoker    Packs/day: 1.00    Years: 25.00    Pack years: 25.00    Types: Cigarettes  . Smokeless tobacco: Never Used  . Tobacco comment: quit age 29     smoked from 69 to 77 off and on  Substance and Sexual Activity  . Alcohol use: No  . Drug use: No  . Sexual activity: Yes    Birth control/protection: Post-menopausal  Other Topics Concern  . Not on file  Social History Narrative   Marital status:  Married; x 26 years. Second marriage. Happily married; no abuse      Children: one child, one stepson;two grandsons      Lives: with husband, mother in 2017      Employment:  [employer] part-time 25-30 hours per week; current job x 19 years.      Tobacco:  None      Alcohol: rare/minimal.      Drugs:  None       Exercise: none in 2017       Seatbelt:  100%.        Sunscreen: SPF 15.      Guns:  Maybe?     PHYSICAL EXAM  Vitals:   04/28/17  BP: (!) 149/76   Pulse: (!) 53  Weight: 205 lb 12.8 oz (93.4 kg)  Height: 5' 2.5" (1.588 m)   Body mass index is 36.03 kg/m.  Generalized: Well developed, Obese female in no acute distress  Head: normocephalic and atraumatic,. Oropharynx benign  Neck: Supple, no carotid bruits  Cardiac: Regular rate rhythm, no murmur  Musculoskeletal: No deformity   Neurological examination   Mentation: Alert oriented to time, place, history taking. Attention span and concentration appropriate. Recent and remote memory intact.  Follows all commands speech and language fluent.   Cranial nerve II-XII: Pupils were equal round reactive to light extraocular movements were full, visual field were full on confrontational test. No nystagmus. Facial sensation and strength were normal. hearing was intact to finger rubbing bilaterally. Uvula tongue midline. head turning and shoulder shrug were normal and symmetric.Tongue protrusion into cheek strength was normal. Motor: normal bulk and tone, full strength in the BUE, BLE, fine finger movements normal, no pronator drift. No focal weakness Sensory: normal and symmetric to light touch, pinprick, and  Vibration, in the upper and lower extremities  Coordination: finger-nose-finger, heel-to-shin bilaterally, no dysmetria, no tremor Reflexes: 1+ upper lower and symmetric plantar responses were flexor bilaterally. Gait and Station: Rising up from seated position without assistance, normal stance,  moderate stride, good arm swing, smooth turning, able to perform tiptoe, and heel walking without difficulty. Tandem gait is steady.  No assistive device  DIAGNOSTIC DATA (LABS, IMAGING, TESTING) - I reviewed patient records, labs, notes, testing and imaging myself where available.  Lab Results  Component Value Date   WBC 7.7 02/11/2017   HGB 12.0 02/11/2017   HCT 36.7 02/11/2017   MCV 94  02/11/2017   PLT 280 02/11/2017      Component Value Date/Time   NA 140 02/11/2017 0955   K 4.6 02/11/2017 0955   CL 102 02/11/2017 0955   CO2 24 02/11/2017 0955   GLUCOSE 107 (H) 02/11/2017 0955   GLUCOSE 166 (H) 12/18/2016 0747   BUN 15 02/11/2017 0955   CREATININE 0.96 02/11/2017 0955   CREATININE 0.91 05/01/2016 0931   CALCIUM 9.5 02/11/2017 0955   PROT 6.8 02/11/2017 0955   ALBUMIN 4.6 02/11/2017 0955   AST 23 02/11/2017 0955   ALT 29 02/11/2017 0955   ALKPHOS 82 02/11/2017 0955   BILITOT 0.8 02/11/2017 0955   GFRNONAA 64 02/11/2017 0955   GFRNONAA 80 02/02/2014 0915   GFRAA 74 02/11/2017 0955   GFRAA >89 02/02/2014 0915   Lab Results  Component Value Date   CHOL 130 02/11/2017   HDL 53 02/11/2017   LDLCALC 62 02/11/2017   TRIG 75 02/11/2017   CHOLHDL 2.5 02/11/2017   Lab Results  Component Value Date   HGBA1C 5.8 (H) 02/11/2017    Lab Results  Component Value Date   TSH 0.68 01/03/2017      ASSESSMENT AND PLAN  61 y.o. year old female  has a past medical history of CAD (coronary artery disease); Diabetes mellitus; Hyperlipidemia; Hypertension;  Obesity, unspecified; Thyroid nodule; and Unspecified vitamin D deficiency. And stroke here for follow-up .MRI stroke right PICA territory infarcts secondary to large vessel source. Loop recorder in place with atrial fib detected.She is now on Eliquis.  She is a current patient of Dr. Erlinda Hong  who is out of the office today . This note is sent to the work in doctor.       PLAN: Stressed the importance of management of risk factors to prevent further stroke Continue eliquis for secondary stroke prevention in atrial fib Maintain strict control of hypertension with blood pressure goal below 130/90, today's [phone]  Control of diabetes with hemoglobin A1c below 6.5 followed by primary care most recent hemoglobin A1c5.8 Cholesterol with LDL cholesterol less than 70, followed by primary care,  most recent 64 continue  Lipitor Exercise by walking, recommend 30 min daily  eat healthy diet with whole grains,  fresh fruits and vegetables Discharge from the stroke clinic I spent 25 minutes in total face to face time with the patient more than 50% of which was spent counseling and coordination of care, reviewing test results reviewing medications and discussing and reviewing the diagnosis of stroke and management of risk factors.  Continued follow-up at primary care and cardiology Dennie Bible, [facility], [facility], [facility] [address], [place],  [phone]

## 2017-04-25 LAB — CUP PACEART REMOTE DEVICE CHECK
Date Time Interrogation Session: 20181029034038
MDC IDC PG IMPLANT DT: 20180302

## 2017-04-28 ENCOUNTER — Ambulatory Visit (INDEPENDENT_AMBULATORY_CARE_PROVIDER_SITE_OTHER): Payer: BLUE CROSS/BLUE SHIELD | Admitting: Nurse Practitioner

## 2017-04-28 ENCOUNTER — Encounter: Payer: Self-pay | Admitting: Nurse Practitioner

## 2017-04-28 VITALS — BP 149/76 | HR 53 | Ht 62.25 in | Wt 198.6 lb

## 2017-04-28 DIAGNOSIS — E785 Hyperlipidemia, unspecified: Secondary | ICD-10-CM

## 2017-04-28 DIAGNOSIS — Z8673 Personal history of transient ischemic attack (TIA), and cerebral infarction without residual deficits: Secondary | ICD-10-CM

## 2017-04-28 DIAGNOSIS — I10 Essential (primary) hypertension: Secondary | ICD-10-CM | POA: Diagnosis not present

## 2017-04-28 DIAGNOSIS — I480 Paroxysmal atrial fibrillation: Secondary | ICD-10-CM | POA: Diagnosis not present

## 2017-04-28 NOTE — Patient Instructions (Addendum)
Stressed the importance of management of risk factors to prevent further stroke Continue eliquis for secondary stroke prevention Maintain strict control of hypertension with blood pressure goal below 130/90, today's [phone] Control of diabetes with hemoglobin A1c below 6.5 followed by primary care most recent hemoglobin A1c5.8 Cholesterol with LDL cholesterol less than 70, followed by primary care,  most recent 64 continue Lipitor Exercise by walking, recommend 30 min daily  eat healthy diet with whole grains,  fresh fruits and vegetables Discharge from the stroke clinic Stroke Prevention Some medical conditions and behaviors are associated with an increased chance of having a stroke. You may prevent a stroke by making healthy choices and managing medical conditions. How can I reduce my risk of having a stroke?  Stay physically active. Get at least 30 minutes of activity on most or all days.  Do not smoke. It may also be helpful to avoid exposure to secondhand smoke.  Limit alcohol use. Moderate alcohol use is considered to be: ? No more than 2 drinks per day for men. ? No more than 1 drink per day for nonpregnant women.  Eat healthy foods. This involves: ? Eating 5 or more servings of fruits and vegetables a day. ? Making dietary changes that address high blood pressure (hypertension), high cholesterol, diabetes, or obesity.  Manage your cholesterol levels. ? Making food choices that are high in fiber and low in saturated fat, trans fat, and cholesterol may control cholesterol levels. ? Take any prescribed medicines to control cholesterol as directed by your health care provider.  Manage your diabetes. ? Controlling your carbohydrate and sugar intake is recommended to manage diabetes. ? Take any prescribed medicines to control diabetes as directed by your health care provider.  Control your hypertension. ? Making food choices that are low in salt (sodium), saturated fat, trans  fat, and cholesterol is recommended to manage hypertension. ? Ask your health care provider if you need treatment to lower your blood pressure. Take any prescribed medicines to control hypertension as directed by your health care provider. ? If you are 52-53 years of age, have your blood pressure checked every 3-5 years. If you are 42 years of age or older, have your blood pressure checked every year.  Maintain a healthy weight. ? Reducing calorie intake and making food choices that are low in sodium, saturated fat, trans fat, and cholesterol are recommended to manage weight.  Stop drug abuse.  Avoid taking birth control pills. ? Talk to your health care provider about the risks of taking birth control pills if you are over 40 years old, smoke, get migraines, or have ever had a blood clot.  Get evaluated for sleep disorders (sleep apnea). ? Talk to your health care provider about getting a sleep evaluation if you snore a lot or have excessive sleepiness.  Take medicines only as directed by your health care provider. ? For some people, aspirin or blood thinners (anticoagulants) are helpful in reducing the risk of forming abnormal blood clots that can lead to stroke. If you have the irregular heart rhythm of atrial fibrillation, you should be on a blood thinner unless there is a good reason you cannot take them. ? Understand all your medicine instructions.  Make sure that other conditions (such as anemia or atherosclerosis) are addressed. Get help right away if:  You have sudden weakness or numbness of the face, arm, or leg, especially on one side of the body.  Your face or eyelid droops to one side.  You have sudden confusion.  You have trouble speaking (aphasia) or understanding.  You have sudden trouble seeing in one or both eyes.  You have sudden trouble walking.  You have dizziness.  You have a loss of balance or coordination.  You have a sudden, severe headache with no known  cause.  You have new chest pain or an irregular heartbeat. Any of these symptoms may represent a serious problem that is an emergency. Do not wait to see if the symptoms will go away. Get medical help at once. Call your local emergency services (911 in [place] Do not drive yourself to the hospital. This information is not intended to replace advice given to you by your health care provider. Make sure you discuss any questions you have with your health care provider. Document Released: 07/18/2004 Document Revised: 11/16/2015 Document Reviewed: 12/11/2012 Elsevier Interactive Patient Education  2017 [employer].

## 2017-04-28 NOTE — Progress Notes (Signed)
I agree with the above plan 

## 2017-05-12 ENCOUNTER — Other Ambulatory Visit: Payer: Self-pay | Admitting: Family Medicine

## 2017-05-16 ENCOUNTER — Encounter: Payer: Self-pay | Admitting: Physician Assistant

## 2017-05-16 ENCOUNTER — Other Ambulatory Visit: Payer: Self-pay

## 2017-05-16 ENCOUNTER — Ambulatory Visit: Payer: BLUE CROSS/BLUE SHIELD | Admitting: Physician Assistant

## 2017-05-16 VITALS — BP 118/68 | HR 77 | Temp 98.3°F | Resp 18 | Ht 62.25 in | Wt 197.4 lb

## 2017-05-16 DIAGNOSIS — J209 Acute bronchitis, unspecified: Secondary | ICD-10-CM

## 2017-05-16 DIAGNOSIS — R05 Cough: Secondary | ICD-10-CM | POA: Diagnosis not present

## 2017-05-16 DIAGNOSIS — R059 Cough, unspecified: Secondary | ICD-10-CM

## 2017-05-16 MED ORDER — HYDROCOD POLST-CPM POLST ER 10-8 MG/5ML PO SUER: 5.0000 mL | Freq: Two times a day (BID) | ORAL | 0 refills | Status: DC | PRN

## 2017-05-16 MED ORDER — BENZONATATE 100 MG PO CAPS: 100.0000 mg | ORAL_CAPSULE | Freq: Three times a day (TID) | ORAL | 0 refills | Status: DC | PRN

## 2017-05-16 MED ORDER — AZITHROMYCIN 250 MG PO TABS: ORAL_TABLET | ORAL | 0 refills | Status: DC

## 2017-05-16 MED ORDER — PREDNISONE 20 MG PO TABS: 40.0000 mg | ORAL_TABLET | Freq: Every day | ORAL | 0 refills | Status: DC

## 2017-05-16 NOTE — Patient Instructions (Addendum)
Take the entire course of both of your medications.  Stay well hydrated. Get lost of rest. Come back if you are not better in 7-10 days.   Advil or ibuprofen for pain. Do not take Aspirin.  Drink enough water and fluids to keep your urine clear or pale yellow. For sore throat: ? Gargle with 8 oz of salt water ( tsp of salt per 1 qt of water) as often as every 1-2 hours to soothe your throat.  Gargle liquid benadryl.  Use Elderberry syrup.   For sore throat try using a honey-based tea. Use 3 teaspoons of honey with juice squeezed from half lemon. Place shaved pieces of ginger into 1/2-1 cup of water and warm over stove top. Then mix the ingredients and repeat every 4 hours as needed.  Cough Syrup Recipe: Sweet Lemon & Honey Thyme  Ingredients a handful of fresh thyme sprigs   1 pint of water (2 cups)  1/2 cup honey (raw is best, but regular will do)  1/2 lemon chopped Instructions 1. Place the lemon in the pint jar and cover with the honey. The honey will macerate the lemons and draw out liquids which taste so delicious! 2. Meanwhile, toss the thyme leaves into a saucepan and cover them with the water. 3. Bring the water to a gentle simmer and reduce it to half, about a cup of tea. 4. When the tea is reduced and cooled a bit, strain the sprigs & leaves, add it into the pint jar and stir it well. 5. Give it a shake and use a spoonful as needed. 6. Store your homemade cough syrup in the refrigerator for about a month.  What causes a cough? In adults, common causes of a cough include: ?An infection of the airways or lungs (such as the common cold) ?Postnasal drip - Postnasal drip is when mucus from the nose drips down or flows along the back of the throat. Postnasal drip can happen when people have: .A cold .Allergies .A sinus infection - The sinuses are hollow areas in the bones of the face that open into the nose. ?Lung conditions, like asthma and chronic obstructive pulmonary  disease (COPD) - Both of these conditions can make it hard to breathe. COPD is usually caused by smoking. ?Acid reflux - Acid reflux is when the acid that is normally in your stomach backs up into your esophagus (the tube that carries food from your mouth to your stomach). ?A side effect from blood pressure medicines called "ACE inhibitors" ?Smoking cigarettes  Is there anything I can do on my own to get rid of my cough? Yes. To help get rid of your cough, you can: ?Use a humidifier in your bedroom ?Use an over-the-counter cough medicine, or suck on cough drops or hard candy ?Stop smoking, if you smoke ?If you have allergies, avoid the things you are allergic to (like pollen, dust, animals, or mold) If you have acid reflux, your doctor or nurse will tell you which lifestyle changes can help reduce symptoms.  Thank you for coming in today. I hope you feel we met your needs.  Feel free to call PCP if you have any questions or further requests.  Please consider signing up for MyChart if you do not already have it, as this is a great way to communicate with me.  Best,  Whitney McVey, PA-C   IF you received an x-ray today, you will receive an invoice from [facility]. Please contact [facility] at  [phone] with questions or concerns regarding your invoice.   IF you received labwork today, you will receive an invoice from [facility]. Please contact [facility] at [phone] with questions or concerns regarding your invoice.   Our billing staff will not be able to assist you with questions regarding bills from these companies.  You will be contacted with the lab results as soon as they are available. The fastest way to get your results is to activate your My Chart account. Instructions are located on the last page of this paperwork. If you have not heard from [place] regarding the results in 2 weeks, please contact this office.

## 2017-05-16 NOTE — Progress Notes (Signed)
Elaine Jackson  MRN: [id number] DOB: 10-13-1955  PCP: [name], MD  Subjective:  Pt is a 61 year old female PMH HTN, CVA, stroke, atrial fibrillation, CAD, HLD, AKI,  thyroid disease who presents to clinic for cough x 4 weeks. Cough is worsening. Endorses a few episodes of night sweats. She coughs so hard "I feel like I'm going to drown".  She has been taking Azelastine, Delsym, Claritin and singulair.   Review of Systems  Constitutional: Positive for diaphoresis. Negative for chills, fatigue and fever.  HENT: Positive for postnasal drip. Negative for congestion, rhinorrhea, sinus pressure, sinus pain and sore throat.   Respiratory: Positive for cough. Negative for shortness of breath and wheezing.   Psychiatric/Behavioral: Positive for sleep disturbance.    Patient Active Problem List   Diagnosis Date Noted  . History of stroke 04/28/2017  . Hypothyroidism 12/24/2016  . Hypocalcemia 12/24/2016  . Thyroid nodule 12/17/2016  . S/P complete thyroidectomy 12/17/2016  . Paroxysmal atrial fibrillation ([place]) 10/14/2016  . History of cerebellar stroke 09/16/2016  . Leukocytosis   . Cerebellar stroke ([place]) 08/23/2016  . Neurologic gait disorder   . History of migraine   . Slow transit constipation   . Acute CVA (cerebrovascular accident) ([place]) 08/21/2016  . Vertebral artery dissection ([place]) 08/20/2016  . Acute kidney injury ([place]) 08/20/2016  . Left thyroid nodule 08/20/2016  . Bradycardia 08/20/2016  . Abnormal urinalysis 08/20/2016  . Diabetes mellitus with complication ([place])   . Vertebrobasilar occlusive disease   . Seasonal allergic rhinitis due to pollen 05/20/2016  . Secondary pulmonary hypertension 08/28/2015  . Old MI (myocardial infarction) 08/28/2015  . Ischemic heart disease 08/28/2015  . Coronary artery disease involving native coronary artery of native heart without angina pectoris 05/15/2015  . Migraine 12/21/2014  . Vitamin D deficiency 01/20/2013  . Nail  dystrophy 09/14/2012  . Essential hypertension, benign 04/16/2012  . Dyslipidemia 04/16/2012  . Class 2 obesity due to excess calories with serious comorbidity and body mass index (BMI) of 36.0 to 36.9 in adult 04/16/2012  . Hx of laparoscopic gastric banding 02/13/2011    Current Outpatient Medications on File Prior to Visit  Medication Sig Dispense Refill  . apixaban (ELIQUIS) 5 MG TABS tablet Take 1 tablet (5 mg total) by mouth 2 (two) times daily. 180 tablet 2  . atorvastatin (LIPITOR) 80 MG tablet TAKE 1 TABLET (80 MG TOTAL) BY MOUTH DAILY AT 6 PM. 30 tablet 0  . cholecalciferol (VITAMIN D) 1000 units tablet Take 1 tablet (1,000 Units total) by mouth daily.    . Cyanocobalamin (VITAMIN B-12 PO) Take 1 tablet by mouth daily.    Marland Kitchen diltiazem (CARDIZEM) 30 MG tablet Take 1 tablet every 4 hours AS NEEDED for rapid afib heart rate >100 45 tablet 2  . fluticasone (FLONASE) 50 MCG/ACT nasal spray Place 2 sprays into both nostrils daily as needed for allergies.   11  . furosemide (LASIX) 20 MG tablet TAKE 1 TABLET BY MOUTH EVERY DAY 90 tablet 0  . glucose blood test strip Use as instructed 100 each 12  . KLOR-CON M20 20 MEQ tablet TAKE 1 TABLET BY MOUTH EVERY DAY 90 tablet 0  . levothyroxine (SYNTHROID, LEVOTHROID) 100 MCG tablet Take 1 tablet (100 mcg total) by mouth daily before breakfast. 30 tablet 3  . montelukast (SINGULAIR) 10 MG tablet TAKE 1 TABLET BY MOUTH EVERYDAY AT BEDTIME 30 tablet 0   No current facility-administered medications on file prior to visit.  Allergies  Allergen Reactions  . Sulfa Antibiotics Nausea And Vomiting  . Zofran [Ondansetron Hcl] Other (See Comments)    Oral numbness with IV dosing.      Objective:  BP 118/68 (BP Location: Left Arm, Patient Position: Sitting, Cuff Size: Large)   Pulse 77   Temp 98.3 F (36.8 C) (Oral)   Resp 18   Ht 5' 2.25" (1.581 m)   Wt 197 lb 6.4 oz (89.5 kg)   SpO2 98%   BMI 35.82 kg/m   Physical Exam  Constitutional:  She is oriented to person, place, and time and well-developed, well-nourished, and in no distress. No distress.  HENT:  Right Ear: External ear normal.  Left Ear: External ear normal.  Cardiovascular: Normal rate, regular rhythm and normal heart sounds.  Pulmonary/Chest: Effort normal and breath sounds normal. She has no wheezes. She has no rales.  Neurological: She is alert and oriented to person, place, and time. GCS score is 15.  Skin: Skin is warm and dry.  Psychiatric: Mood, memory, affect and judgment normal.  Vitals reviewed.   Assessment and Plan :  1. Acute bronchitis, unspecified organism - azithromycin (ZITHROMAX) 250 MG tablet; Take 2 tabs PO x 1 dose, then 1 tab PO QD x 4 days  Dispense: 6 tablet; Refill: 0 - predniSONE (DELTASONE) 20 MG tablet; Take 2 tablets (40 mg total) by mouth daily with breakfast.  Dispense: 10 tablet; Refill: 0 - Worsening cough x 4 weeks. Plan to cover.  Supportive care discussed. RTC if no improvement in 5-7 days.  2. Cough - chlorpheniramine-HYDROcodone (TUSSIONEX PENNKINETIC ER) 10-8 MG/5ML SUER; Take 5 mLs by mouth every 12 (twelve) hours as needed for cough.  Dispense: 100 mL; Refill: 0   Whitney Rochel Privett, PA-C  [facility] 05/16/2017 2:52 PM

## 2017-05-20 ENCOUNTER — Ambulatory Visit (INDEPENDENT_AMBULATORY_CARE_PROVIDER_SITE_OTHER): Payer: BLUE CROSS/BLUE SHIELD | Admitting: *Deleted

## 2017-05-20 DIAGNOSIS — I639 Cerebral infarction, unspecified: Secondary | ICD-10-CM | POA: Diagnosis not present

## 2017-05-21 NOTE — Progress Notes (Signed)
Carelink Summary Report / Loop Recorder 

## 2017-05-22 ENCOUNTER — Ambulatory Visit: Payer: Self-pay | Admitting: *Deleted

## 2017-05-22 NOTE — Telephone Encounter (Signed)
Pt was  Seen  6  Days  Ago  For  / bronchitis   Was  Given   A  Prescription  For  Anti  Biotics   And  Prednisone  And  Cough  meds  She  Completed  The  Course she  Reports  She   Is  Better  But  Still  Has  Some  Tightness  And   Congested    Nasal  Discharge    And   Symptoms  She  States  She   Is  [place]   An  Appointment  Within  3  Days . She  Was  Advised  About  E  Visit  And  She  Has  My  Chart .  She  States  She  May  Utilize this  Option     Reason for Disposition . [1] Nasal discharge AND [2] present > 10 days  Answer Assessment - Initial Assessment Questions 1. ONSET: "When did the cough begin?"       3  1/2   WEEKS    2. SEVERITY: "How bad is the cough today?"        MOD     3. RESPIRATORY DISTRESS: "Describe your breathing."       Breathing  Ok   Breathing seems  Tight  4. FEVER: "Do you have a fever?" If so, ask: "What is your temperature, how was it measured, and when did it start?"     None 5. SPUTUM: "Describe the color of your sputum" (clear, white, yellow, green)      Thick  Mucous  White    6. HEMOPTYSIS: "Are you coughing up any blood?" If so ask: "How much?" (flecks, streaks, tablespoons, etc.)       none 7. CARDIAC HISTORY: "Do you have any history of heart disease?" (e.g., heart attack, congestive heart failure)       A  Fib   8. LUNG HISTORY: "Do you have any history of lung disease?"  (e.g., pulmonary embolus, asthma, emphysema)      none 9. PE RISK FACTORS: "Do you have a history of blood clots?" (or: recent major surgery, recent prolonged travel, bedridden )     From  A  Fib   And  It   Was  In  Head   10. OTHER SYMPTOMS: "Do you have any other symptoms?" (e.g., runny nose, wheezing, chest pain)       Runny  Nose   Ears  Feel   Congested   11. PREGNANCY: "Is there any chance you are pregnant?" "When was your last menstrual period?"       No 12. TRAVEL: "Have you traveled out of the country in the last month?" (e.g., travel  history, exposures)        NO  Protocols used: [place]

## 2017-06-04 ENCOUNTER — Other Ambulatory Visit: Payer: Self-pay | Admitting: Family Medicine

## 2017-06-05 ENCOUNTER — Other Ambulatory Visit: Payer: Self-pay | Admitting: Physician Assistant

## 2017-06-05 ENCOUNTER — Other Ambulatory Visit: Payer: Self-pay | Admitting: Family Medicine

## 2017-06-05 LAB — CUP PACEART REMOTE DEVICE CHECK
Date Time Interrogation Session: 20181128051131
Implantable Pulse Generator Implant Date: 20180302

## 2017-06-09 ENCOUNTER — Ambulatory Visit (INDEPENDENT_AMBULATORY_CARE_PROVIDER_SITE_OTHER): Payer: BLUE CROSS/BLUE SHIELD | Admitting: Family Medicine

## 2017-06-09 ENCOUNTER — Encounter: Payer: Self-pay | Admitting: Family Medicine

## 2017-06-09 ENCOUNTER — Other Ambulatory Visit: Payer: Self-pay

## 2017-06-09 VITALS — BP 130/82 | HR 56 | Temp 97.7°F | Resp 16 | Ht 62.21 in | Wt 197.0 lb

## 2017-06-09 DIAGNOSIS — G450 Vertebro-basilar artery syndrome: Secondary | ICD-10-CM

## 2017-06-09 DIAGNOSIS — Z0000 Encounter for general adult medical examination without abnormal findings: Secondary | ICD-10-CM

## 2017-06-09 DIAGNOSIS — E118 Type 2 diabetes mellitus with unspecified complications: Secondary | ICD-10-CM | POA: Diagnosis not present

## 2017-06-09 DIAGNOSIS — I7774 Dissection of vertebral artery: Secondary | ICD-10-CM

## 2017-06-09 DIAGNOSIS — Z8673 Personal history of transient ischemic attack (TIA), and cerebral infarction without residual deficits: Secondary | ICD-10-CM | POA: Diagnosis not present

## 2017-06-09 DIAGNOSIS — G43709 Chronic migraine without aura, not intractable, without status migrainosus: Secondary | ICD-10-CM

## 2017-06-09 DIAGNOSIS — Z124 Encounter for screening for malignant neoplasm of cervix: Secondary | ICD-10-CM

## 2017-06-09 DIAGNOSIS — J301 Allergic rhinitis due to pollen: Secondary | ICD-10-CM | POA: Diagnosis not present

## 2017-06-09 DIAGNOSIS — I259 Chronic ischemic heart disease, unspecified: Secondary | ICD-10-CM

## 2017-06-09 DIAGNOSIS — I2510 Atherosclerotic heart disease of native coronary artery without angina pectoris: Secondary | ICD-10-CM | POA: Diagnosis not present

## 2017-06-09 DIAGNOSIS — E890 Postprocedural hypothyroidism: Secondary | ICD-10-CM | POA: Diagnosis not present

## 2017-06-09 DIAGNOSIS — I480 Paroxysmal atrial fibrillation: Secondary | ICD-10-CM | POA: Diagnosis not present

## 2017-06-09 DIAGNOSIS — K219 Gastro-esophageal reflux disease without esophagitis: Secondary | ICD-10-CM

## 2017-06-09 DIAGNOSIS — I10 Essential (primary) hypertension: Secondary | ICD-10-CM

## 2017-06-09 DIAGNOSIS — Z9884 Bariatric surgery status: Secondary | ICD-10-CM

## 2017-06-09 DIAGNOSIS — E7800 Pure hypercholesterolemia, unspecified: Secondary | ICD-10-CM

## 2017-06-09 LAB — POCT URINALYSIS DIP (MANUAL ENTRY)
Bilirubin, UA: NEGATIVE
GLUCOSE UA: NEGATIVE mg/dL
Ketones, POC UA: NEGATIVE mg/dL
NITRITE UA: NEGATIVE
PH UA: 5 (ref 5.0–8.0)
PROTEIN UA: NEGATIVE mg/dL
RBC UA: NEGATIVE
SPEC GRAV UA: 1.025 (ref 1.010–1.025)
UROBILINOGEN UA: 0.2 U/dL

## 2017-06-09 MED ORDER — RANITIDINE HCL 150 MG PO CAPS: 150.0000 mg | ORAL_CAPSULE | Freq: Two times a day (BID) | ORAL | 1 refills | Status: DC

## 2017-06-09 MED ORDER — ZOSTER VAC RECOMB ADJUVANTED 50 MCG/0.5ML IM SUSR: 0.5000 mL | Freq: Once | INTRAMUSCULAR | 1 refills | Status: AC

## 2017-06-09 MED ORDER — MONTELUKAST SODIUM 10 MG PO TABS: ORAL_TABLET | ORAL | 3 refills | Status: DC

## 2017-06-09 MED ORDER — GLUCOSE BLOOD VI STRP: ORAL_STRIP | 3 refills | Status: DC

## 2017-06-09 MED ORDER — LEVOTHYROXINE SODIUM 100 MCG PO TABS
ORAL_TABLET | ORAL | 3 refills | Status: DC
Start: 2017-06-09 — End: 2018-01-04

## 2017-06-09 MED ORDER — FLUTICASONE PROPIONATE 50 MCG/ACT NA SUSP: 2.0000 | Freq: Every day | NASAL | 3 refills | Status: AC | PRN

## 2017-06-09 MED ORDER — ATORVASTATIN CALCIUM 80 MG PO TABS: ORAL_TABLET | ORAL | 3 refills | Status: DC

## 2017-06-09 NOTE — Progress Notes (Signed)
Subjective:    Patient ID: Elaine Jackson, female    DOB: 01-07-1956, 61 y.o.   MRN: [id number]  06/09/2017  Annual Exam    HPI This 61 y.o. female presents for Complete Physical Examination.  Last physical:  05-01-2016 Pap smear:  05-01-2016  WNL HPV negative. Mammogram:  10-15-2016 Colonoscopy:  2009   Visual Acuity Screening   Right eye Left eye Both eyes  Without correction: 20/20 20/20 20/20   With correction:      Health Maintenance  Topic Date Due  . COLONOSCOPY  06/24/2017  . HEMOGLOBIN A1C  12/08/2017  . OPHTHALMOLOGY EXAM  01/01/2018  . FOOT EXAM  06/09/2018  . URINE MICROALBUMIN  06/09/2018  . MAMMOGRAM  10/16/2018  . PAP SMEAR  06/09/2020  . TETANUS/TDAP  09/01/2021  . PNEUMOCOCCAL POLYSACCHARIDE VACCINE  Completed  . INFLUENZA VACCINE  Completed  . Hepatitis C Screening  Completed  . HIV Screening  Completed   BP Readings from Last 3 Encounters:  06/09/17 130/82  05/16/17 118/68  04/28/17 (!) 149/76   Wt Readings from Last 3 Encounters:  06/09/17 197 lb (89.4 kg)  05/16/17 197 lb 6.4 oz (89.5 kg)  04/28/17 198 lb 9.6 oz (90.1 kg)   Immunization History  Administered Date(s) Administered  . Hepatitis A 08/13/2010, 03/20/2011  . Hepatitis B 06/24/2010, 08/13/2010, 12/03/2010, 03/20/2011  . Influenza Split 03/20/2011, 03/02/2012  . Influenza,inj,Quad PF,6+ Mos 04/28/2013, 03/14/2014, 05/01/2016  . Influenza-Unspecified 04/19/2015, 03/18/2017  . Pneumococcal Polysaccharide-23 06/06/2014  . Pneumococcal-Unspecified 07/18/2008  . Td 11/22/2001  . Tdap 09/02/2011  . Zoster 06/16/2015    Review of Systems  Constitutional: Negative for activity change, appetite change, chills, diaphoresis, fatigue, fever and unexpected weight change.  HENT: Positive for drooling and postnasal drip. Negative for congestion, dental problem, ear discharge, ear pain, facial swelling, hearing loss, mouth sores, nosebleeds, rhinorrhea, sinus pressure, sneezing, sore throat,  tinnitus, trouble swallowing and voice change.   Eyes: Negative for photophobia, pain, discharge, redness, itching and visual disturbance.  Respiratory: Negative for apnea, cough, choking, chest tightness, shortness of breath, wheezing and stridor.   Cardiovascular: Negative for chest pain, palpitations and leg swelling.  Gastrointestinal: Negative for abdominal distention, abdominal pain, anal bleeding, blood in stool, constipation, diarrhea, nausea, rectal pain and vomiting.  Endocrine: Positive for cold intolerance. Negative for heat intolerance, polydipsia, polyphagia and polyuria.  Genitourinary: Negative for decreased urine volume, difficulty urinating, dyspareunia, dysuria, enuresis, flank pain, frequency, genital sores, hematuria, menstrual problem, pelvic pain, urgency, vaginal bleeding, vaginal discharge and vaginal pain.       Nocturia x 0.  No urinary leakage.  Musculoskeletal: Negative for arthralgias, back pain, gait problem, joint swelling, myalgias, neck pain and neck stiffness.  Skin: Negative for color change, pallor, rash and wound.  Allergic/Immunologic: Positive for environmental allergies. Negative for food allergies and immunocompromised state.  Neurological: Negative for dizziness, tremors, seizures, syncope, facial asymmetry, speech difficulty, weakness, light-headedness, numbness and headaches.  Hematological: Negative for adenopathy. Does not bruise/bleed easily.  Psychiatric/Behavioral: Negative for agitation, behavioral problems, confusion, decreased concentration, dysphoric mood, hallucinations, self-injury, sleep disturbance and suicidal ideas. The patient is not nervous/anxious and is not hyperactive.        Bedtime 11:00; wakes up 6:00.    Past Medical History:  Diagnosis Date  . Allergic rhinitis, cause unspecified   . Allergy   . CAD (coronary artery disease)    LHC 12/20/2014 showed small to moderate apical infarction due to distal LAD stenosis with  reestablished TIMI2  flow. No PCI. Recommended plavix for 1 yr and ASA for life.   . Chicken pox   . Diabetes mellitus   . Family history of breast cancer in first degree relative    sister age 73  . Heart murmur   . Hyperlipidemia   . Hypertension   . Malignant neoplasm skin of face 02/16/2007   Basal Cell Carcinoma  [place].  . Measles   . Obesity, unspecified   . Other chronic nonalcoholic liver disease   . Sleep related leg cramps   . Thyroid nodule    10-2016   . Unspecified vitamin D deficiency   . Vision abnormalities    Past Surgical History:  Procedure Laterality Date  . CARDIAC CATHETERIZATION N/A 12/20/2014   Procedure: Left Heart Cath and Coronary Angiography;  Surgeon: Sanda Klein, MD;  Location: [facility];  Service: Cardiovascular;  Laterality: N/A;  . CHOLECYSTECTOMY    . COLONOSCOPY  06/25/2007   [place]. Normal. Repeat in ten years.  Marland Kitchen LAPAROSCOPIC GASTRIC BANDING  07/03/10   Dr. Sherrin Daisy; [facility]  . LOOP RECORDER INSERTION N/A 08/23/2016   Procedure: Loop Recorder Insertion;  Surgeon: Deboraha Sprang, MD;  Location: [facility];  Service: Cardiovascular;  Laterality: N/A;  . THYROIDECTOMY N/A 12/17/2016   Procedure: TOTAL THYROIDECTOMY;  Surgeon: Excell Seltzer, MD;  Location: [facility];  Service: General;  Laterality: N/A;  . TONSILLECTOMY    . TONSILLECTOMY     Allergies  Allergen Reactions  . Sulfa Antibiotics Nausea And Vomiting  . Zofran [Ondansetron Hcl] Other (See Comments)    Oral numbness with IV dosing.    Current Outpatient Medications on File Prior to Visit  Medication Sig Dispense Refill  . apixaban (ELIQUIS) 5 MG TABS tablet Take 1 tablet (5 mg total) by mouth 2 (two) times daily. 180 tablet 2  . chlorpheniramine-HYDROcodone (TUSSIONEX PENNKINETIC ER) 10-8 MG/5ML SUER Take 5 mLs by mouth every 12 (twelve) hours as needed for cough. 100 mL 0  . cholecalciferol (VITAMIN D) 1000 units tablet Take 1 tablet (1,000 Units total) by  mouth daily.    . Cyanocobalamin (VITAMIN B-12 PO) Take 1 tablet by mouth daily.    Marland Kitchen diltiazem (CARDIZEM) 30 MG tablet Take 1 tablet every 4 hours AS NEEDED for rapid afib heart rate >100 45 tablet 2  . FLUZONE QUADRIVALENT 0.5 ML injection   0  . furosemide (LASIX) 20 MG tablet TAKE 1 TABLET BY MOUTH EVERY DAY 90 tablet 0  . KLOR-CON M20 20 MEQ tablet TAKE 1 TABLET BY MOUTH EVERY DAY 90 tablet 0  . valACYclovir (VALTREX) 1000 MG tablet TAKE 2 TABLETS BY MOUTH NOW THEN 2 TABLETS IN 12 HOURS  0   No current facility-administered medications on file prior to visit.    Social History   Socioeconomic History  . Marital status: Married    Spouse name: Not on file  . Number of children: 1  . Years of education: Not on file  . Highest education level: Not on file  Social Needs  . Financial resource strain: Not on file  . Food insecurity - worry: Not on file  . Food insecurity - inability: Not on file  . Transportation needs - medical: Not on file  . Transportation needs - non-medical: Not on file  Occupational History  . Occupation: HAIR STYLIST    Employer: [employer]  Tobacco Use  . Smoking status: Former Smoker    Packs/day: 1.00  Years: 25.00    Pack years: 25.00    Types: Cigarettes  . Smokeless tobacco: Never Used  . Tobacco comment: quit age 15     smoked from 35 to 52 off and on  Substance and Sexual Activity  . Alcohol use: No  . Drug use: No  . Sexual activity: Yes    Birth control/protection: Post-menopausal  Other Topics Concern  . Not on file  Social History Narrative   Marital status:  Married; x 27 years. Second marriage. 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      Tobacco:  None      Alcohol: rare/minimal.      Drugs:  None       Exercise:        Seatbelt:  100%.       Sunscreen: SPF 15.       Guns:  Maybe?   Family History  Problem Relation Age of Onset  . Diabetes Mother   . Heart disease Mother        defibrillator; carotid artery stenosis.  . Hypertension Mother   . Hyperlipidemia Mother   . Dementia Mother   . Congestive Heart Failure Mother   . Heart disease Father   . Kidney failure Father        ESRD/peritoneal dialysis  . Diabetes Father   . Hypertension Father   . Hyperlipidemia Father   . Stroke Father   . Kidney disease Father   . Congestive Heart Failure Father   . Diabetes Sister   . Hypertension Sister   . Kidney disease Sister        renal failure  . Diabetes Brother   . Hypertension Brother   . Hyperlipidemia Brother   . Arthritis Brother   . Cervical cancer Sister   . Hypertension Sister   . Liver cancer Sister 53  . Cancer Sister        Liver cancer  . Stroke Maternal Grandmother   . Heart disease Maternal Grandfather   . Stroke Paternal Grandmother   . Heart disease Paternal Grandmother   . Heart disease Paternal Grandfather   . Stroke Paternal Grandfather   . Breast cancer Sister 35  . Hepatitis C Sister 1  . Thyroid disease Neg Hx        Objective:    BP 130/82   Pulse (!) 56   Temp 97.7 F (36.5 C) (Oral)   Resp 16   Ht 5' 2.21" (1.58 m)   Wt 197 lb (89.4 kg)   SpO2 98%   BMI 35.79 kg/m  Physical Exam  Constitutional: She is oriented to person, place, and time. She appears well-developed and well-nourished. No distress.  HENT:  Head: Normocephalic and atraumatic.  Right Ear: External ear normal.  Left Ear: External ear normal.  Nose: Nose normal.  Mouth/Throat: Oropharynx is clear and moist.  Eyes: Conjunctivae and EOM are normal. Pupils are equal, round, and reactive to light.  Neck: Normal range of motion and full passive range of motion without pain. Neck supple. No JVD present. Carotid bruit is not present. No thyromegaly present.  Cardiovascular: Normal rate, regular rhythm and normal heart sounds. Exam reveals  no gallop and no friction rub.  No murmur heard. Pulmonary/Chest: Effort normal and breath sounds normal.  She has no wheezes. She has no rales. Right breast exhibits no inverted nipple, no mass, no nipple discharge, no skin change and no tenderness. Left breast exhibits no inverted nipple, no mass, no nipple discharge, no skin change and no tenderness. Breasts are symmetrical.  Abdominal: Soft. Bowel sounds are normal. She exhibits no distension and no mass. There is no tenderness. There is no rebound and no guarding.  Genitourinary: Vagina normal and uterus normal. There is no rash, tenderness, lesion or injury on the right labia. There is no rash, tenderness, lesion or injury on the left labia. Cervix exhibits no motion tenderness, no discharge and no friability. Right adnexum displays no mass, no tenderness and no fullness. Left adnexum displays no mass, no tenderness and no fullness. No erythema, tenderness or bleeding in the vagina. No foreign body in the vagina. No signs of injury around the vagina. No vaginal discharge found.  Musculoskeletal:       Right shoulder: Normal.       Left shoulder: Normal.       Cervical back: Normal.  Lymphadenopathy:    She has no cervical adenopathy.  Neurological: She is alert and oriented to person, place, and time. She has normal reflexes. No cranial nerve deficit. She exhibits normal muscle tone. Coordination normal.  Skin: Skin is warm and dry. No rash noted. She is not diaphoretic. No erythema. No pallor.  Psychiatric: She has a normal mood and affect. Her behavior is normal. Judgment and thought content normal.  Nursing note and vitals reviewed.  No results found. Depression screen [facility] 2/9 05/16/2017 02/11/2017 11/30/2016 11/05/2016 08/28/2016  Decreased Interest 0 - 0 0 0  Down, Depressed, Hopeless 0 0 0 0 0  PHQ - 2 Score 0 0 0 0 0   Fall Risk  05/16/2017 02/11/2017 11/30/2016 11/05/2016 09/16/2016  Falls in the past year? No No No No No  Risk for fall due  to : - - History of fall(s) - -        Assessment & Plan:   1. Routine physical examination   2. Coronary artery disease involving native coronary artery of native heart without angina pectoris   3. Essential hypertension, benign   4. Ischemic heart disease   5. Chronic migraine without aura without status migrainosus, not intractable   6. Paroxysmal atrial fibrillation (HCC)   7. Vertebral artery dissection ([place])   8. Vertebrobasilar occlusive disease   9. Seasonal allergic rhinitis due to pollen   10. Diabetes mellitus with complication ([place])   11. Postoperative hypothyroidism   12. History of cerebellar stroke   13. Hx of laparoscopic gastric banding   14. Pure hypercholesterolemia   15. Cervical cancer screening   16. Gastroesophageal reflux disease without esophagitis     -anticipatory guidance provided --- exercise, weight loss, safe driving practices, aspirin 81mg  daily. -obtain age appropriate screening labs and labs for chronic disease management. -recommend weight loss, exercise for 30-60 minutes five days per week; recommend 1200 kcal restriction per day with a minimum of 60 grams of protein per day. -Prescribed Zantac for new onset GERD.  Dietary modification recommended  Orders Placed This Encounter  Procedures  . CBC with Differential/Platelet  . Comprehensive metabolic panel    Order Specific Question:   Has the patient fasted?    Answer:   No  . Hemoglobin A1c  . Lipid panel    Order Specific Question:   Has the patient fasted?    Answer:   No  .  Microalbumin, urine  . TSH  . POCT urinalysis dipstick   Meds ordered this encounter  Medications  . Zoster Vaccine Adjuvanted [facility]) injection    Sig: Inject 0.5 mLs into the muscle once for 1 dose.    Dispense:  0.5 mL    Refill:  1  . montelukast (SINGULAIR) 10 MG tablet    Sig: TAKE 1 TABLET BY MOUTH EVERYDAY AT BEDTIME    Dispense:  90 tablet    Refill:  3  . levothyroxine (SYNTHROID, LEVOTHROID)  100 MCG tablet    Sig: TAKE 1 TABLET BY MOUTH EVERY DAY BEFORE BREAKFAST    Dispense:  90 tablet    Refill:  3  . glucose blood test strip    Sig: Use as instructed    Dispense:  100 each    Refill:  3    Insurance no longer accepts the one touch. Please order according to plan. Thanks,  . fluticasone (FLONASE) 50 MCG/ACT nasal spray    Sig: Place 2 sprays into both nostrils daily as needed for allergies.    Dispense:  48 g    Refill:  3  . atorvastatin (LIPITOR) 80 MG tablet    Sig: TAKE 1 TABLET (80 MG TOTAL) BY MOUTH DAILY AT 6 PM.    Dispense:  90 tablet    Refill:  3  . ranitidine (ZANTAC) 150 MG capsule    Sig: Take 1 capsule (150 mg total) by mouth 2 (two) times daily.    Dispense:  180 capsule    Refill:  1    Return in about 6 months (around 12/08/2017) for follow-up chronic medical conditions.   Jeremey Bascom Elayne Guerin, M.D. Primary Care at [facility] previously [facility] [address] [place], [place]  [address] [phone] phone [phone] fax

## 2017-06-09 NOTE — Patient Instructions (Addendum)
IF you received an x-ray today, you will receive an invoice from [facility]. Please contact [facility] at [phone] with questions or concerns regarding your invoice.   IF you received labwork today, you will receive an invoice from [facility]. Please contact [facility] at [phone] with questions or concerns regarding your invoice.   Our billing staff will not be able to assist you with questions regarding bills from these companies.  You will be contacted with the lab results as soon as they are available. The fastest way to get your results is to activate your My Chart account. Instructions are located on the last page of this paperwork. If you have not heard from [place] regarding the results in 2 weeks, please contact this office.      Preventive Care 40-64 Years, Female Preventive care refers to lifestyle choices and visits with your health care provider that can promote health and wellness. What does preventive care include?  A yearly physical exam. This is also called an annual well check.  Dental exams once or twice a year.  Routine eye exams. Ask your health care provider how often you should have your eyes checked.  Personal lifestyle choices, including: ? Daily care of your teeth and gums. ? Regular physical activity. ? Eating a healthy diet. ? Avoiding tobacco and drug use. ? Limiting alcohol use. ? Practicing safe sex. ? Taking low-dose aspirin daily starting at age 34. ? Taking vitamin and mineral supplements as recommended by your health care provider. What happens during an annual well check? The services and screenings done by your health care provider during your annual well check will depend on your age, overall health, lifestyle risk factors, and family history of disease. Counseling Your health care provider may ask you questions about your:  Alcohol use.  Tobacco use.  Drug use.  Emotional well-being.  Home and relationship  well-being.  Sexual activity.  Eating habits.  Work and work Statistician.  Method of birth control.  Menstrual cycle.  Pregnancy history.  Screening You may have the following tests or measurements:  Height, weight, and BMI.  Blood pressure.  Lipid and cholesterol levels. These may be checked every 5 years, or more frequently if you are over 65 years old.  Skin check.  Lung cancer screening. You may have this screening every year starting at age 102 if you have a 30-pack-year history of smoking and currently smoke or have quit within the past 15 years.  Fecal occult blood test (FOBT) of the stool. You may have this test every year starting at age 33.  Flexible sigmoidoscopy or colonoscopy. You may have a sigmoidoscopy every 5 years or a colonoscopy every 10 years starting at age 23.  Hepatitis C blood test.  Hepatitis B blood test.  Sexually transmitted disease (STD) testing.  Diabetes screening. This is done by checking your blood sugar (glucose) after you have not eaten for a while (fasting). You may have this done every 1-3 years.  Mammogram. This may be done every 1-2 years. Talk to your health care provider about when you should start having regular mammograms. This may depend on whether you have a family history of breast cancer.  BRCA-related cancer screening. This may be done if you have a family history of breast, ovarian, tubal, or peritoneal cancers.  Pelvic exam and Pap test. This may be done every 3 years starting at age 3. Starting at age 55, this may be done every 5 years if  you have a Pap test in combination with an HPV test.  Bone density scan. This is done to screen for osteoporosis. You may have this scan if you are at high risk for osteoporosis.  Discuss your test results, treatment options, and if necessary, the need for more tests with your health care provider. Vaccines Your health care provider may recommend certain vaccines, such  as:  Influenza vaccine. This is recommended every year.  Tetanus, diphtheria, and acellular pertussis (Tdap, Td) vaccine. You may need a Td booster every 10 years.  Varicella vaccine. You may need this if you have not been vaccinated.  Zoster vaccine. You may need this after age 70.  Measles, mumps, and rubella (MMR) vaccine. You may need at least one dose of MMR if you were born in 1957 or later. You may also need a second dose.  Pneumococcal 13-valent conjugate (PCV13) vaccine. You may need this if you have certain conditions and were not previously vaccinated.  Pneumococcal polysaccharide (PPSV23) vaccine. You may need one or two doses if you smoke cigarettes or if you have certain conditions.  Meningococcal vaccine. You may need this if you have certain conditions.  Hepatitis A vaccine. You may need this if you have certain conditions or if you travel or work in places where you may be exposed to hepatitis A.  Hepatitis B vaccine. You may need this if you have certain conditions or if you travel or work in places where you may be exposed to hepatitis B.  Haemophilus influenzae type b (Hib) vaccine. You may need this if you have certain conditions.  Talk to your health care provider about which screenings and vaccines you need and how often you need them. This information is not intended to replace advice given to you by your health care provider. Make sure you discuss any questions you have with your health care provider. Document Released: 07/07/2015 Document Revised: 02/28/2016 Document Reviewed: 04/11/2015 Elsevier Interactive Patient Education  2017 [employer].

## 2017-06-10 ENCOUNTER — Encounter: Payer: BLUE CROSS/BLUE SHIELD | Admitting: Family Medicine

## 2017-06-10 LAB — COMPREHENSIVE METABOLIC PANEL
A/G RATIO: 1.9 (ref 1.2–2.2)
ALT: 23 IU/L (ref 0–32)
AST: 22 IU/L (ref 0–40)
Albumin: 4.3 g/dL (ref 3.6–4.8)
Alkaline Phosphatase: 73 IU/L (ref 39–117)
BUN / CREAT RATIO: 20 (ref 12–28)
BUN: 17 mg/dL (ref 8–27)
Bilirubin Total: 0.5 mg/dL (ref 0.0–1.2)
CHLORIDE: 106 mmol/L (ref 96–106)
CO2: 23 mmol/L (ref 20–29)
CREATININE: 0.83 mg/dL (ref 0.57–1.00)
Calcium: 9.3 mg/dL (ref 8.7–10.3)
GFR, EST AFRICAN AMERICAN: 87 mL/min/{1.73_m2} (ref >59)
GFR, EST NON AFRICAN AMERICAN: 76 mL/min/{1.73_m2} (ref >59)
GLOBULIN, TOTAL: 2.3 g/dL (ref 1.5–4.5)
Glucose: 112 mg/dL — ABNORMAL HIGH (ref 65–99)
Potassium: 5 mmol/L (ref 3.5–5.2)
Sodium: 142 mmol/L (ref 134–144)
Total Protein: 6.6 g/dL (ref 6.0–8.5)

## 2017-06-10 LAB — CBC WITH DIFFERENTIAL/PLATELET
Basophils Absolute: 0 10*3/uL (ref 0.0–0.2)
Basos: 1 %
EOS (ABSOLUTE): 0.1 10*3/uL (ref 0.0–0.4)
EOS: 2 %
HEMATOCRIT: 35.8 % (ref 34.0–46.6)
Hemoglobin: 11.6 g/dL (ref 11.1–15.9)
IMMATURE GRANULOCYTES: 0 %
Immature Grans (Abs): 0 10*3/uL (ref 0.0–0.1)
LYMPHS ABS: 1.4 10*3/uL (ref 0.7–3.1)
Lymphs: 23 %
MCH: 30.6 pg (ref 26.6–33.0)
MCHC: 32.4 g/dL (ref 31.5–35.7)
MCV: 95 fL (ref 79–97)
MONOS ABS: 0.4 10*3/uL (ref 0.1–0.9)
Monocytes: 6 %
NEUTROS PCT: 68 %
Neutrophils Absolute: 4.1 10*3/uL (ref 1.4–7.0)
PLATELETS: 283 10*3/uL (ref 150–379)
RBC: 3.79 x10E6/uL (ref 3.77–5.28)
RDW: 14.5 % (ref 12.3–15.4)
WBC: 6 10*3/uL (ref 3.4–10.8)

## 2017-06-10 LAB — PAP IG W/ RFLX HPV ASCU: PAP SMEAR COMMENT: 0

## 2017-06-10 LAB — LIPID PANEL
CHOL/HDL RATIO: 2.1 ratio (ref 0.0–4.4)
Cholesterol, Total: 118 mg/dL (ref 100–199)
HDL: 56 mg/dL (ref >39)
LDL Calculated: 51 mg/dL (ref 0–99)
TRIGLYCERIDES: 54 mg/dL (ref 0–149)
VLDL Cholesterol Cal: 11 mg/dL (ref 5–40)

## 2017-06-10 LAB — MICROALBUMIN, URINE: Microalbumin, Urine: 14.2 ug/mL

## 2017-06-10 LAB — HEMOGLOBIN A1C
Est. average glucose Bld gHb Est-mCnc: 123 mg/dL
Hgb A1c MFr Bld: 5.9 % — ABNORMAL HIGH (ref 4.8–5.6)

## 2017-06-10 LAB — TSH: TSH: 5.39 u[IU]/mL — ABNORMAL HIGH (ref 0.450–4.500)

## 2017-06-19 ENCOUNTER — Ambulatory Visit: Payer: BLUE CROSS/BLUE SHIELD | Admitting: *Deleted

## 2017-06-20 ENCOUNTER — Ambulatory Visit (INDEPENDENT_AMBULATORY_CARE_PROVIDER_SITE_OTHER): Payer: BLUE CROSS/BLUE SHIELD | Admitting: *Deleted

## 2017-06-20 DIAGNOSIS — I639 Cerebral infarction, unspecified: Secondary | ICD-10-CM | POA: Diagnosis not present

## 2017-06-23 NOTE — Progress Notes (Signed)
Carelink Summary Report / Loop Recorder 

## 2017-07-18 LAB — CUP PACEART REMOTE DEVICE CHECK
Implantable Pulse Generator Implant Date: 20180302
MDC IDC PG IMPLANT DT: 20180302
MDC IDC SESS DTM: 20181228124548
MDC IDC SESS DTM: 20181228124548

## 2017-07-21 ENCOUNTER — Ambulatory Visit (INDEPENDENT_AMBULATORY_CARE_PROVIDER_SITE_OTHER): Payer: BLUE CROSS/BLUE SHIELD | Admitting: *Deleted

## 2017-07-21 DIAGNOSIS — I639 Cerebral infarction, unspecified: Secondary | ICD-10-CM

## 2017-07-21 NOTE — Progress Notes (Signed)
Carelink Summary Report / Loop Recorder 

## 2017-07-23 ENCOUNTER — Telehealth: Payer: Self-pay | Admitting: Cardiology

## 2017-07-23 NOTE — Telephone Encounter (Signed)
Patient called and stated that her home monitor light up around 5 AM and the other morning around 2 AM. I informed pt that the monitor completes nightly checks between 12 AM - 5 AM. Pt verbalized understanding.

## 2017-07-31 LAB — CUP PACEART REMOTE DEVICE CHECK
Implantable Pulse Generator Implant Date: 20180302
MDC IDC SESS DTM: 20190127134118

## 2017-08-11 ENCOUNTER — Other Ambulatory Visit: Payer: Self-pay | Admitting: Family Medicine

## 2017-08-11 NOTE — Telephone Encounter (Signed)
Klor-Con refill Last OV: 02/11/17 Last Refill:05/12/17  Pharmacy:[facility] [id number]  Furosemide refill Last OV: 02/11/17 Last Refill:05/12/17 Pharmacy:[facility] [id number]   Sent back to provider

## 2017-08-13 ENCOUNTER — Other Ambulatory Visit: Payer: Self-pay

## 2017-08-13 ENCOUNTER — Encounter: Payer: Self-pay | Admitting: Family Medicine

## 2017-08-13 MED ORDER — POTASSIUM CHLORIDE CRYS ER 20 MEQ PO TBCR: 20.0000 meq | EXTENDED_RELEASE_TABLET | Freq: Every day | ORAL | 0 refills | Status: DC

## 2017-08-13 MED ORDER — FUROSEMIDE 20 MG PO TABS: 20.0000 mg | ORAL_TABLET | Freq: Every day | ORAL | 0 refills | Status: DC

## 2017-08-17 MED ORDER — OSELTAMIVIR PHOSPHATE 75 MG PO CAPS: 75.0000 mg | ORAL_CAPSULE | Freq: Every day | ORAL | 0 refills | Status: DC

## 2017-08-22 ENCOUNTER — Ambulatory Visit (INDEPENDENT_AMBULATORY_CARE_PROVIDER_SITE_OTHER): Payer: BLUE CROSS/BLUE SHIELD | Admitting: *Deleted

## 2017-08-22 DIAGNOSIS — I639 Cerebral infarction, unspecified: Secondary | ICD-10-CM | POA: Diagnosis not present

## 2017-08-22 NOTE — Progress Notes (Signed)
Carelink Summary Report / Loop Recorder 

## 2017-09-02 ENCOUNTER — Encounter: Payer: Self-pay | Admitting: Internal Medicine

## 2017-09-02 ENCOUNTER — Ambulatory Visit: Payer: BLUE CROSS/BLUE SHIELD | Admitting: Internal Medicine

## 2017-09-02 VITALS — BP 140/82 | HR 63 | Ht 62.0 in | Wt 197.0 lb

## 2017-09-02 DIAGNOSIS — I639 Cerebral infarction, unspecified: Secondary | ICD-10-CM

## 2017-09-02 DIAGNOSIS — I480 Paroxysmal atrial fibrillation: Secondary | ICD-10-CM

## 2017-09-02 LAB — CUP PACEART INCLINIC DEVICE CHECK
Date Time Interrogation Session: 20190312163231
Implantable Pulse Generator Implant Date: 20180302

## 2017-09-02 NOTE — Progress Notes (Signed)
Patient Care Team: [name], MD as PCP - General (Family Medicine)   HPI  Elaine Jackson is a 62 y.o. female Seen in followup for Cryptogenic Stroke for which she received a LINQ recorder.    She was found to have atrial fibrillation is now on apixaban  She has a history of coronary artery disease with apical MI 6/16 with a distal LAD lesion.  Treated medically.  She is treated with statins  Date Cr K. Hgb  12/18 0,83 5.0  11.6         She is having recurrent symptomatic atrial fibrillation.  It is characterized by a visual aura neck discomfort and jaw discomfort.  This discomfort is similar to her MI discomfort consistent with demand ischemia.  Is accompanied by profound weakness.  Episodes have typically lasted 8-10 hours.  She takes as needed diltiazem.  Records and Results Reviewed  Dr Marlou Porch records  Thromboembolic risk factors (, HTN-1, TIA/CVA-2,  , Vasc disease -1, Gender-1) for a CHADSVASc Score of 5 Hemoglobin A1c 12/18 was 5.9 Past Medical History:  Diagnosis Date  . Allergic rhinitis, cause unspecified   . Allergy   . CAD (coronary artery disease)    LHC 12/20/2014 showed small to moderate apical infarction due to distal LAD stenosis with reestablished TIMI2 flow. No PCI. Recommended plavix for 1 yr and ASA for life.   . Chicken pox   . Diabetes mellitus   . Family history of breast cancer in first degree relative    sister age 29  . Heart murmur   . Hyperlipidemia   . Hypertension   . Malignant neoplasm skin of face 02/16/2007   Basal Cell Carcinoma  [place].  . Measles   . Obesity, unspecified   . Other chronic nonalcoholic liver disease   . Sleep related leg cramps   . Thyroid nodule    10-2016   . Unspecified vitamin D deficiency   . Vision abnormalities     Past Surgical History:  Procedure Laterality Date  . CARDIAC CATHETERIZATION N/A 12/20/2014   Procedure: Left Heart Cath and Coronary Angiography;  Surgeon: Sanda Romin Divita, MD;   Location: [facility];  Service: Cardiovascular;  Laterality: N/A;  . CHOLECYSTECTOMY    . COLONOSCOPY  06/25/2007   [place]. Normal. Repeat in ten years.  Marland Kitchen LAPAROSCOPIC GASTRIC BANDING  07/03/10   Dr. Sherrin Daisy; [facility]  . LOOP RECORDER INSERTION N/A 08/23/2016   Procedure: Loop Recorder Insertion;  Surgeon: Deboraha Sprang, MD;  Location: [facility];  Service: Cardiovascular;  Laterality: N/A;  . THYROIDECTOMY N/A 12/17/2016   Procedure: TOTAL THYROIDECTOMY;  Surgeon: Excell Seltzer, MD;  Location: [facility];  Service: General;  Laterality: N/A;  . TONSILLECTOMY    . TONSILLECTOMY      Current Outpatient Medications  Medication Sig Dispense Refill  . apixaban (ELIQUIS) 5 MG TABS tablet Take 1 tablet (5 mg total) by mouth 2 (two) times daily. 180 tablet 2  . atorvastatin (LIPITOR) 80 MG tablet TAKE 1 TABLET (80 MG TOTAL) BY MOUTH DAILY AT 6 PM. 90 tablet 3  . cholecalciferol (VITAMIN D) 1000 units tablet Take 1 tablet (1,000 Units total) by mouth daily.    . Cyanocobalamin (VITAMIN B-12 PO) Take 1 tablet by mouth daily.    Marland Kitchen diltiazem (CARDIZEM) 30 MG tablet Take 1 tablet every 4 hours AS NEEDED for rapid afib heart rate >100 45 tablet 2  . fluticasone (FLONASE) 50  MCG/ACT nasal spray Place 2 sprays into both nostrils daily as needed for allergies. 48 g 3  . FLUZONE QUADRIVALENT 0.5 ML injection   0  . furosemide (LASIX) 20 MG tablet Take 1 tablet (20 mg total) by mouth daily. 90 tablet 0  . glucose blood test strip Use as instructed 100 each 3  . levothyroxine (SYNTHROID, LEVOTHROID) 100 MCG tablet TAKE 1 TABLET BY MOUTH EVERY DAY BEFORE BREAKFAST 90 tablet 3  . montelukast (SINGULAIR) 10 MG tablet TAKE 1 TABLET BY MOUTH EVERYDAY AT BEDTIME 90 tablet 3  . oseltamivir (TAMIFLU) 75 MG capsule Take 1 capsule (75 mg total) by mouth daily. 10 capsule 0  . potassium chloride SA (KLOR-CON M20) 20 MEQ tablet Take 1 tablet (20 mEq total) by mouth daily. 90 tablet 0  . ranitidine  (ZANTAC) 150 MG capsule Take 1 capsule (150 mg total) by mouth 2 (two) times daily. 180 capsule 1   No current facility-administered medications for this visit.     Allergies  Allergen Reactions  . Sulfa Antibiotics Nausea And Vomiting  . Zofran [Ondansetron Hcl] Other (See Comments)    Oral numbness with IV dosing.      Marland Kitchen  He notes he would Review of Systems negative except from HPI and PMH  Physical Exam BP 140/82   Pulse 63   Ht 5\' 2"  (1.575 m)   Wt 197 lb (89.4 kg)   SpO2 99%   BMI 36.03 kg/m  Well developed and nourished in no acute distress HENT normal Neck supple with JVP-flat Clear Regular rate and rhythm, 2/6 systolic murmur  Abd-soft with active BS No Clubbing cyanosis edema Skin-warm and dry A & Oriented  Grossly normal sensory and motor function  Sinus rhythm at 52 Intervals 14/08/44 (QTc41) Otherwise normal  Implantable loop recorder demonstrated recurrent episodes of atrial fibrillation with a moderately rapid rate averages about 100 with peaks in the 150 range.  Cryptogenic stroke    Assessment and  Plan  Stroke  Atrial fibrillation-paroxysmal rapid ventricular response  sinus bradycardia  Ischemic heart disease with prior MI  Hypertension  Prediabetes    The patient has recurrent symptomatic atrial fibrillation which is quite rapid unfortunately, she has sinus bradycardia precluding AV nodal blockade except using it as needed as she is doing currently.  Given the frequency of the episodes the accompanying ischemia I suggested a rhythm control and strategy.  We discussed the use of dofetilide.  Her QTC today is well within range.  She will look at her schedule and let us know.  We reviewed proarrhythmic risks.  We also discussed the role of catheter ablation; she would like to undertaking medication trial first.  We discussed the results of CABANA  No bleeding  Blood pressure is reasonably controlled.  No ischemia  We spent more  than 50% of our >25 min visit in face to face counseling regarding the above      Current medicines are reviewed at length with the patient today .  The patient does not  have concerns regarding medicines.

## 2017-09-02 NOTE — Patient Instructions (Addendum)
Medication Instructions:  Your physician recommends that you continue on your current medications as directed. Please refer to the Current Medication list given to you today.  Labwork: None ordered.  Testing/Procedures: None ordered.  Follow-Up: Your physician recommends that you schedule a follow-up appointment in:   One Year with Dr Caryl Comes if you do not start Tikosyn  Schedule a consult with Dr Rayann Heman for possible Afib ablation   Any Other Special Instructions Will Be Listed Below (If Applicable).     If you need a refill on your cardiac medications before your next appointment, please call your pharmacy.  Dofetilide capsules What is this medicine? DOFETILIDE (doe FET il ide) is an antiarrhythmic drug. It helps make your heart beat regularly. This medicine also helps to slow rapid heartbeats. This medicine may be used for other purposes; ask your health care provider or pharmacist if you have questions. COMMON BRAND NAME(S): Tikosyn What should I tell my health care provider before I take this medicine? They need to know if you have any of these conditions: -heart disease -history of low levels of potassium or magnesium -kidney disease -liver disease -an unusual or allergic reaction to dofetilide, other medicines, foods, dyes, or preservatives -pregnant or trying to get pregnant -breast-feeding How should I use this medicine? Take this medicine by mouth with a glass of water. Follow the directions on the prescription label. You can take this medicine with or without food. Do not drink grapefruit juice with this medicine. Take your doses at regular intervals. Do not take your medicine more often than directed. Do not stop taking this medicine suddenly. This may cause serious, heart-related side effects. Your doctor will tell you how much medicine to take. If your doctor wants you to stop the medicine, the dose will be slowly lowered over time to avoid any side effects. Talk to  your pediatrician regarding the use of this medicine in children. Special care may be needed. Overdosage: If you think you have taken too much of this medicine contact a poison control center or emergency room at once. NOTE: This medicine is only for you. Do not share this medicine with others. What if I miss a dose? If you miss a dose, take it as soon as you can. If it is almost time for your next dose, take only that dose. Do not take double or extra doses. What may interact with this medicine? Do not take this medicine with any of the following medications: -cimetidine -dolutegravir -hydrochlorothiazide alone or in combination with other medicines -isavuconazonium -ketoconazole -megestrol -other medicines that prolong the QT interval (cause an abnormal heart rhythm) -prochlorperazine -trimethoprim alone or in combination with sulfamethoxazole -verapamil This medicine may also interact with the following medications: -amiloride -certain antidepressants like fluvoxamine or paroxetine -certain antiviral medicines for HIV or AIDS like atazanavir or darunavir -certain medicines for fungal infections like clotrimazole or miconazole -digoxin -diltiazem -dronabinol, THC -grapefruit juice -metformin -nefazodone -triamterene -zafirlukast This list may not describe all possible interactions. Give your health care provider a list of all the medicines, herbs, non-prescription drugs, or dietary supplements you use. Also tell them if you smoke, drink alcohol, or use illegal drugs. Some items may interact with your medicine. What should I watch for while using this medicine? Visit your doctor or health care professional for regular checks on your progress. Wear a medical ID bracelet or chain, and carry a card that describes your disease and details of your medicine and dosage times. Check your heart rate  and blood pressure regularly while you are taking this medicine. Ask your doctor or health  care professional what your heart rate and blood pressure should be, and when you should contact him or her. Your doctor or health care professional also may schedule regular tests to check your progress. You will be started on this medicine in a specialized facility for at least three days. You will be monitored to find the right dose of medicine for you. It is very important that you take your medicine exactly as prescribed when you leave the hospital. The correct dosing of this medicine is very important to treat your condition and prevent possible serious side effects. What side effects may I notice from receiving this medicine? Side effects that you should report to your doctor or health care professional as soon as possible: -allergic reactions like skin rash, itching or hives, swelling of the face, lips, or tongue -breathing problems -dizziness -fast or rapid beating of the heart -feeling faint or lightheaded -swelling of the ankles -unusually weak or tired -vomiting Side effects that usually do not require medical attention (report to your doctor or health care professional if they continue or are bothersome): -cough -diarrhea -difficulty sleeping -headache -nausea -stomach pain This list may not describe all possible side effects. Call your doctor for medical advice about side effects. You may report side effects to FDA at 1-800-FDA-1088. Where should I keep my medicine? Keep out of the reach of children. Store at room temperature between 15 and 30 degrees C (59 and 86 degrees F). Protect the medicine from moisture or humidity. Keep container tightly closed. Throw away any unused medicine after the expiration date. NOTE: This sheet is a summary. It may not cover all possible information. If you have questions about this medicine, talk to your doctor, pharmacist, or health care provider.  2018 Elsevier/Gold Standard (2016-04-22 61:95:09)

## 2017-09-15 ENCOUNTER — Encounter: Payer: Self-pay | Admitting: Internal Medicine

## 2017-09-15 ENCOUNTER — Ambulatory Visit: Payer: BLUE CROSS/BLUE SHIELD | Admitting: Internal Medicine

## 2017-09-15 VITALS — BP 138/82 | HR 53 | Ht 62.5 in | Wt 200.5 lb

## 2017-09-15 DIAGNOSIS — I639 Cerebral infarction, unspecified: Secondary | ICD-10-CM

## 2017-09-15 DIAGNOSIS — I481 Persistent atrial fibrillation: Secondary | ICD-10-CM

## 2017-09-15 DIAGNOSIS — I259 Chronic ischemic heart disease, unspecified: Secondary | ICD-10-CM | POA: Diagnosis not present

## 2017-09-15 DIAGNOSIS — I4819 Other persistent atrial fibrillation: Secondary | ICD-10-CM

## 2017-09-15 DIAGNOSIS — I480 Paroxysmal atrial fibrillation: Secondary | ICD-10-CM

## 2017-09-15 DIAGNOSIS — R001 Bradycardia, unspecified: Secondary | ICD-10-CM | POA: Diagnosis not present

## 2017-09-15 MED ORDER — METOPROLOL TARTRATE 50 MG PO TABS: 50.0000 mg | ORAL_TABLET | Freq: Once | ORAL | 0 refills | Status: DC

## 2017-09-15 NOTE — Patient Instructions (Addendum)
Medication Instructions:  Your physician recommends that you continue on your current medications as directed. Please refer to the Current Medication list given to you today. If you need a refill on your cardiac medications before your next appointment, please call your pharmacy.  Labwork: You will get lab work today:  BMP and CBC.  Testing/Procedures:  Your physician has requested that you have cardiac CT. Cardiac computed tomography (CT) is a painless test that uses an x-ray machine to take clear, detailed pictures of your heart. For further information please visit [facility]. Please follow instruction sheet as given.-You will get a call from our office to schedule the date for this test.  Your physician has recommended that you have an ablation. Catheter ablation is a medical procedure used to treat some cardiac arrhythmias (irregular heartbeats). During catheter ablation, a long, thin, flexible tube is put into a blood vessel in your groin (upper thigh), or neck. This tube is called an ablation catheter. It is then guided to your heart through the blood vessel. Radio frequency waves destroy small areas of heart tissue where abnormal heartbeats may cause an arrhythmia to start. Please see the instruction sheet given to you today.  Follow-Up: You will follow up with Roderic Palau, NP with the Atrial fibrillation (Afib) clinic 4 weeks after your ablation.  You will follow up with Dr. Rayann Heman 3 months after your procedure.  CARDIAC CT INSTRUCTIONS:  Please arrive at the [facility] main entrance of [facility] [place], [place] [address] [phone]  Proceed to the [facility] Radiology Department (First Floor).  Please follow these instructions carefully (unless otherwise directed):   On the Night Before the Test: . Drink plenty of water. . Do not consume any caffeinated/decaffeinated beverages or chocolate 12 hours prior to  your test. . Do not take any antihistamines 12 hours prior to your test. . If you take Metformin do not take 24 hours prior to test. On the Day of the Test: . Drink plenty of water. Do not drink any water within one hour of the test. . Do not eat any food 4 hours prior to the test. . You may take your regular medications prior to the test. . IF NOT ON A BETA BLOCKER - Take 50 mg of lopressor (metoprolol) one hour before the test.  You are on a beta blocker.  Take your nebivolol prior to this procedure. Marland Kitchen HOLD Furosemide morning of the test.  After the Test: . Drink plenty of water. . After receiving IV contrast, you may experience a mild flushed feeling. This is normal. . On occasion, you may experience a mild rash up to 24 hours after the test. This is not dangerous. If this occurs, you can take Benadryl 25 mg and increase your fluid intake. . If you experience trouble breathing, this can be serious. If it is severe call 911 IMMEDIATELY. If it is mild, please call our office. . If you take any of these medications: Glipizide/Metformin, Avandament, Glucavance, please do not take 48 hours after completing test.  ABLATION INSTRUCTIONS:  Please arrive at the [facility] main entrance of [facility] hospital at: 7:30 am on September 25, 2017 Do not eat or drink after midnight prior to procedure On the morning of your procedure do not take any medications. Plan for one night stay.  You will need someone to drive you home at discharge.

## 2017-09-15 NOTE — H&P (View-Only) (Signed)
Electrophysiology Office Note   Date:  09/15/2017   ID:  MICCA MATURA, DOB 1955/08/01, MRN [id number]  PCP:  [name], MD  Primary Cardiologist: Marlou Porch Primary Electrophysiologist: Dr Caryl Comes  CC: afib   History of Present Illness: Elaine Jackson is a 62 y.o. female who presents today for electrophysiology evaluation.   The patient has a h/o atrial fibrillation.  She had MI 2016.  She had stroke 2/18.  She reports having an ILR placed at that time.  ILR has revealed atrial fibrillation.  She has been placed on eliquis.  In retrospect, she thinks that she has had afib going back to her MI.   Recent ILR interrogation also appears to reveal atrial flutter.  Episodes have increased in frequency and duration and typically lasts about 10 hours.  She reports symptoms of "auras", chest discomfort and palpitations.  She is unaware of triggers or precipitants.  She has not tried AAD therapy.  Her V rates are quiet fast during atrial fibrillation but slow in sinus.  Dr Caryl Comes feels that this limits her treatment options.  Today, she denies symptoms of palpitations, chest pain, shortness of breath, orthopnea, PND, lower extremity edema, claudication, dizziness, presyncope, syncope, bleeding, or neurologic sequela. The patient is tolerating medications without difficulties and is otherwise without complaint today.    Past Medical History:  Diagnosis Date  . Allergic rhinitis, cause unspecified   . Allergy   . CAD (coronary artery disease)    LHC 12/20/2014 showed small to moderate apical infarction due to distal LAD stenosis with reestablished TIMI2 flow. No PCI. Recommended plavix for 1 yr and ASA for life.   . Chicken pox   . Diabetes mellitus   . Family history of breast cancer in first degree relative    sister age 67  . Hyperlipidemia   . Hypertension   . Malignant neoplasm skin of face 02/16/2007   Basal Cell Carcinoma  [facility].  . Measles   . Obesity, unspecified   . Other chronic  nonalcoholic liver disease   . Sleep related leg cramps   . Stroke (cerebrum) ([place])    due to large vessel disease  . Thyroid nodule    10-2016   . Unspecified vitamin D deficiency   . Vision abnormalities    Past Surgical History:  Procedure Laterality Date  . CARDIAC CATHETERIZATION N/A 12/20/2014   Procedure: Left Heart Cath and Coronary Angiography;  Surgeon: Sanda Klein, MD;  Location: [facility];  Service: Cardiovascular;  Laterality: N/A;  . CHOLECYSTECTOMY    . COLONOSCOPY  06/25/2007   Hung. Normal. Repeat in ten years.  Marland Kitchen LAPAROSCOPIC GASTRIC BANDING  07/03/10   Dr. Sherrin Daisy; [facility]  . LOOP RECORDER INSERTION N/A 08/23/2016   Procedure: Loop Recorder Insertion;  Surgeon: Deboraha Sprang, MD;  Location: [facility];  Service: Cardiovascular;  Laterality: N/A;  . THYROIDECTOMY N/A 12/17/2016   Procedure: TOTAL THYROIDECTOMY;  Surgeon: Excell Seltzer, MD;  Location: [facility];  Service: General;  Laterality: N/A;  . TONSILLECTOMY    . TONSILLECTOMY       Current Outpatient Medications  Medication Sig Dispense Refill  . apixaban (ELIQUIS) 5 MG TABS tablet Take 1 tablet (5 mg total) by mouth 2 (two) times daily. 180 tablet 2  . atorvastatin (LIPITOR) 80 MG tablet TAKE 1 TABLET (80 MG TOTAL) BY MOUTH DAILY AT 6 PM. 90 tablet 3  . cholecalciferol (VITAMIN D) 1000 units tablet Take 1 tablet (  1,000 Units total) by mouth daily.    . Cyanocobalamin (VITAMIN B-12 PO) Take 1 tablet by mouth daily.    Marland Kitchen diltiazem (CARDIZEM) 30 MG tablet Take 1 tablet every 4 hours AS NEEDED for rapid afib heart rate >100 45 tablet 2  . fluticasone (FLONASE) 50 MCG/ACT nasal spray Place 2 sprays into both nostrils daily as needed for allergies. 48 g 3  . furosemide (LASIX) 20 MG tablet Take 1 tablet (20 mg total) by mouth daily. 90 tablet 0  . glucose blood test strip Use as instructed 100 each 3  . levothyroxine (SYNTHROID, LEVOTHROID) 100 MCG tablet TAKE 1 TABLET BY MOUTH EVERY DAY  BEFORE BREAKFAST 90 tablet 3  . montelukast (SINGULAIR) 10 MG tablet TAKE 1 TABLET BY MOUTH EVERYDAY AT BEDTIME 90 tablet 3  . oseltamivir (TAMIFLU) 75 MG capsule Take 1 capsule (75 mg total) by mouth daily. 10 capsule 0  . potassium chloride SA (KLOR-CON M20) 20 MEQ tablet Take 1 tablet (20 mEq total) by mouth daily. 90 tablet 0  . metoprolol tartrate (LOPRESSOR) 50 MG tablet Take 1 tablet (50 mg total) by mouth once for 1 dose. Take once 1 hour prior to CT 1 tablet 0   No current facility-administered medications for this visit.     Allergies:   Sulfa antibiotics and Zofran [ondansetron hcl]   Social History:  The patient  reports that she has quit smoking. Her smoking use included cigarettes. She has a 25.00 pack-year smoking history. She has never used smokeless tobacco. She reports that she does not drink alcohol or use drugs.   Family History:  The patient's  family history includes Arthritis in her brother; Breast cancer (age of onset: 52) in her sister; Cancer in her sister; Cervical cancer in her sister; Congestive Heart Failure in her father and mother; Dementia in her mother; Diabetes in her brother, father, mother, and sister; Heart disease in her father, maternal grandfather, mother, paternal grandfather, and paternal grandmother; Hepatitis C (age of onset: 17) in her sister; Hyperlipidemia in her brother, father, and mother; Hypertension in her brother, father, mother, sister, and sister; Kidney disease in her father and sister; Kidney failure in her father; Liver cancer (age of onset: 97) in her sister; Stroke in her father, maternal grandmother, paternal grandfather, and paternal grandmother.    ROS:  Please see the history of present illness.   All other systems are personally reviewed and negative.    PHYSICAL EXAM: VS:  BP 138/82   Pulse (!) 53   Ht 5' 2.5" (1.588 m)   Wt 200 lb 8 oz (90.9 kg)   SpO2 99%   BMI 36.09 kg/m  , BMI Body mass index is 36.09 kg/m. GEN: Well  nourished, well developed, in no acute distress  HEENT: normal  Neck: no JVD, carotid bruits, or masses Cardiac: RRR; no murmurs, rubs, or gallops,no edema  Respiratory:  clear to auscultation bilaterally, normal work of breathing GI: soft, nontender, nondistended, + BS MS: no deformity or atrophy  Skin: warm and dry  Neuro:  Strength and sensation are intact Psych: euthymic mood, full affect  EKG:  EKG from 09/02/17 is personally reviewed and reveals sinus rhythm 52 bpm, Qtc 411 msec   Recent Labs: 06/09/2017: ALT 23; BUN 17; Creatinine, Ser 0.83; Hemoglobin 11.6; Platelets 283; Potassium 5.0; Sodium 142; TSH 5.390  personally reviewed   Lipid Panel     Component Value Date/Time   CHOL 118 06/09/2017 0916   TRIG 54  06/09/2017 0916   HDL 56 06/09/2017 0916   CHOLHDL 2.1 06/09/2017 0916   CHOLHDL 2.4 08/21/2016 0147   VLDL 11 08/21/2016 0147   LDLCALC 51 06/09/2017 0916   personally reviewed   Wt Readings from Last 3 Encounters:  09/15/17 200 lb 8 oz (90.9 kg)  09/02/17 197 lb (89.4 kg)  06/09/17 197 lb (89.4 kg)      Other studies personally reviewed: Additional studies/ records that were reviewed today include: Dr Aquilla Hacker notes, ILR interrogation, ekg, echo fropm 08/22/16  Review of the above records today demonstrates: preserved EF, trivial AI   ASSESSMENT AND PLAN:  1.  Paroxysmal atrial fibrillation/ atrial flutter The patient has symptomatic atrial arrhythmias She is on eliquis for chads2vasc score of at least 6 Medical therapy is limited by bradycardia. Therapeutic strategies for afib including medicine (tikosyn) and ablation were discussed in detail with the patient today. Risk, benefits, and alternatives to EP study and radiofrequency ablation for afib were also discussed in detail today. These risks include but are not limited to stroke, bleeding, vascular damage, tamponade, perforation, damage to the esophagus, lungs, and other structures, pulmonary vein  stenosis, worsening renal function, and death. The patient understands these risk and wishes to proceed.  We will therefore proceed with catheter ablation at the next available time.  Will request Cardiac CT prior to ablation   Current medicines are reviewed at length with the patient today.   The patient does not have concerns regarding her medicines.  The following changes were made today:  none  Labs/ tests ordered today include:  Orders Placed This Encounter  Procedures  . CT CARDIAC MORPH/PULM VEIN W/CM&W/O CA SCORE  . CT CORONARY FRACTIONAL FLOW RESERVE DATA PREP  . CT CORONARY FRACTIONAL FLOW RESERVE FLUID ANALYSIS  . Basic Metabolic Panel (BMET)  . CBC w/Diff  . EKG 12-Lead     Signed, Thompson Grayer, MD  09/15/2017 10:00 AM     [place] [address] [phone] (office) [phone] (fax)

## 2017-09-15 NOTE — Progress Notes (Signed)
Electrophysiology Office Note   Date:  09/15/2017   ID:  Elaine Jackson, DOB Nov 10, 1955, MRN [id number]  PCP:  [name], MD  Primary Cardiologist: Marlou Porch Primary Electrophysiologist: Dr Caryl Comes  CC: afib   History of Present Illness: Elaine Jackson is a 62 y.o. female who presents today for electrophysiology evaluation.   The patient has a h/o atrial fibrillation.  She had MI 2016.  She had stroke 2/18.  She reports having an ILR placed at that time.  ILR has revealed atrial fibrillation.  She has been placed on eliquis.  In retrospect, she thinks that she has had afib going back to her MI.   Recent ILR interrogation also appears to reveal atrial flutter.  Episodes have increased in frequency and duration and typically lasts about 10 hours.  She reports symptoms of "auras", chest discomfort and palpitations.  She is unaware of triggers or precipitants.  She has not tried AAD therapy.  Her V rates are quiet fast during atrial fibrillation but slow in sinus.  Dr Caryl Comes feels that this limits her treatment options.  Today, she denies symptoms of palpitations, chest pain, shortness of breath, orthopnea, PND, lower extremity edema, claudication, dizziness, presyncope, syncope, bleeding, or neurologic sequela. The patient is tolerating medications without difficulties and is otherwise without complaint today.    Past Medical History:  Diagnosis Date  . Allergic rhinitis, cause unspecified   . Allergy   . CAD (coronary artery disease)    LHC 12/20/2014 showed small to moderate apical infarction due to distal LAD stenosis with reestablished TIMI2 flow. No PCI. Recommended plavix for 1 yr and ASA for life.   . Chicken pox   . Diabetes mellitus   . Family history of breast cancer in first degree relative    sister age 38  . Hyperlipidemia   . Hypertension   . Malignant neoplasm skin of face 02/16/2007   Basal Cell Carcinoma  [place].  . Measles   . Obesity, unspecified   . Other chronic  nonalcoholic liver disease   . Sleep related leg cramps   . Stroke (cerebrum) ([place])    due to large vessel disease  . Thyroid nodule    10-2016   . Unspecified vitamin D deficiency   . Vision abnormalities    Past Surgical History:  Procedure Laterality Date  . CARDIAC CATHETERIZATION N/A 12/20/2014   Procedure: Left Heart Cath and Coronary Angiography;  Surgeon: Sanda Klein, MD;  Location: [facility];  Service: Cardiovascular;  Laterality: N/A;  . CHOLECYSTECTOMY    . COLONOSCOPY  06/25/2007   [place]. Normal. Repeat in ten years.  Marland Kitchen LAPAROSCOPIC GASTRIC BANDING  07/03/10   Dr. Sherrin Daisy; [facility]  . LOOP RECORDER INSERTION N/A 08/23/2016   Procedure: Loop Recorder Insertion;  Surgeon: Deboraha Sprang, MD;  Location: [facility];  Service: Cardiovascular;  Laterality: N/A;  . THYROIDECTOMY N/A 12/17/2016   Procedure: TOTAL THYROIDECTOMY;  Surgeon: Excell Seltzer, MD;  [facility];  Service: General;  Laterality: N/A;  . TONSILLECTOMY    . TONSILLECTOMY       Current Outpatient Medications  Medication Sig Dispense Refill  . apixaban (ELIQUIS) 5 MG TABS tablet Take 1 tablet (5 mg total) by mouth 2 (two) times daily. 180 tablet 2  . atorvastatin (LIPITOR) 80 MG tablet TAKE 1 TABLET (80 MG TOTAL) BY MOUTH DAILY AT 6 PM. 90 tablet 3  . cholecalciferol (VITAMIN D) 1000 units tablet Take 1 tablet (  1,000 Units total) by mouth daily.    . Cyanocobalamin (VITAMIN B-12 PO) Take 1 tablet by mouth daily.    Marland Kitchen diltiazem (CARDIZEM) 30 MG tablet Take 1 tablet every 4 hours AS NEEDED for rapid afib heart rate >100 45 tablet 2  . fluticasone (FLONASE) 50 MCG/ACT nasal spray Place 2 sprays into both nostrils daily as needed for allergies. 48 g 3  . furosemide (LASIX) 20 MG tablet Take 1 tablet (20 mg total) by mouth daily. 90 tablet 0  . glucose blood test strip Use as instructed 100 each 3  . levothyroxine (SYNTHROID, LEVOTHROID) 100 MCG tablet TAKE 1 TABLET BY MOUTH EVERY DAY  BEFORE BREAKFAST 90 tablet 3  . montelukast (SINGULAIR) 10 MG tablet TAKE 1 TABLET BY MOUTH EVERYDAY AT BEDTIME 90 tablet 3  . oseltamivir (TAMIFLU) 75 MG capsule Take 1 capsule (75 mg total) by mouth daily. 10 capsule 0  . potassium chloride SA (KLOR-CON M20) 20 MEQ tablet Take 1 tablet (20 mEq total) by mouth daily. 90 tablet 0  . metoprolol tartrate (LOPRESSOR) 50 MG tablet Take 1 tablet (50 mg total) by mouth once for 1 dose. Take once 1 hour prior to CT 1 tablet 0   No current facility-administered medications for this visit.     Allergies:   Sulfa antibiotics and Zofran [ondansetron hcl]   Social History:  The patient  reports that she has quit smoking. Her smoking use included cigarettes. She has a 25.00 pack-year smoking history. She has never used smokeless tobacco. She reports that she does not drink alcohol or use drugs.   Family History:  The patient's  family history includes Arthritis in her brother; Breast cancer (age of onset: 28) in her sister; Cancer in her sister; Cervical cancer in her sister; Congestive Heart Failure in her father and mother; Dementia in her mother; Diabetes in her brother, father, mother, and sister; Heart disease in her father, maternal grandfather, mother, paternal grandfather, and paternal grandmother; Hepatitis C (age of onset: 49) in her sister; Hyperlipidemia in her brother, father, and mother; Hypertension in her brother, father, mother, sister, and sister; Kidney disease in her father and sister; Kidney failure in her father; Liver cancer (age of onset: 81) in her sister; Stroke in her father, maternal grandmother, paternal grandfather, and paternal grandmother.    ROS:  Please see the history of present illness.   All other systems are personally reviewed and negative.    PHYSICAL EXAM: VS:  BP 138/82   Pulse (!) 53   Ht 5' 2.5" (1.588 m)   Wt 200 lb 8 oz (90.9 kg)   SpO2 99%   BMI 36.09 kg/m  , BMI Body mass index is 36.09 kg/m. GEN: Well  nourished, well developed, in no acute distress  HEENT: normal  Neck: no JVD, carotid bruits, or masses Cardiac: RRR; no murmurs, rubs, or gallops,no edema  Respiratory:  clear to auscultation bilaterally, normal work of breathing GI: soft, nontender, nondistended, + BS MS: no deformity or atrophy  Skin: warm and dry  Neuro:  Strength and sensation are intact Psych: euthymic mood, full affect  EKG:  EKG from 09/02/17 is personally reviewed and reveals sinus rhythm 52 bpm, Qtc 411 msec   Recent Labs: 06/09/2017: ALT 23; BUN 17; Creatinine, Ser 0.83; Hemoglobin 11.6; Platelets 283; Potassium 5.0; Sodium 142; TSH 5.390  personally reviewed   Lipid Panel     Component Value Date/Time   CHOL 118 06/09/2017 0916   TRIG 54  06/09/2017 0916   HDL 56 06/09/2017 0916   CHOLHDL 2.1 06/09/2017 0916   CHOLHDL 2.4 08/21/2016 0147   VLDL 11 08/21/2016 0147   LDLCALC 51 06/09/2017 0916   personally reviewed   Wt Readings from Last 3 Encounters:  09/15/17 200 lb 8 oz (90.9 kg)  09/02/17 197 lb (89.4 kg)  06/09/17 197 lb (89.4 kg)      Other studies personally reviewed: Additional studies/ records that were reviewed today include: Dr Aquilla Hacker notes, ILR interrogation, ekg, echo fropm 08/22/16  Review of the above records today demonstrates: preserved EF, trivial AI   ASSESSMENT AND PLAN:  1.  Paroxysmal atrial fibrillation/ atrial flutter The patient has symptomatic atrial arrhythmias She is on eliquis for chads2vasc score of at least 6 Medical therapy is limited by bradycardia. Therapeutic strategies for afib including medicine (tikosyn) and ablation were discussed in detail with the patient today. Risk, benefits, and alternatives to EP study and radiofrequency ablation for afib were also discussed in detail today. These risks include but are not limited to stroke, bleeding, vascular damage, tamponade, perforation, damage to the esophagus, lungs, and other structures, pulmonary vein  stenosis, worsening renal function, and death. The patient understands these risk and wishes to proceed.  We will therefore proceed with catheter ablation at the next available time.  Will request Cardiac CT prior to ablation   Current medicines are reviewed at length with the patient today.   The patient does not have concerns regarding her medicines.  The following changes were made today:  none  Labs/ tests ordered today include:  Orders Placed This Encounter  Procedures  . CT CARDIAC MORPH/PULM VEIN W/CM&W/O CA SCORE  . CT CORONARY FRACTIONAL FLOW RESERVE DATA PREP  . CT CORONARY FRACTIONAL FLOW RESERVE FLUID ANALYSIS  . Basic Metabolic Panel (BMET)  . CBC w/Diff  . EKG 12-Lead     Signed, Thompson Grayer, MD  09/15/2017 10:00 AM     [place] [address] [phone] (office) [phone] (fax)

## 2017-09-16 LAB — CBC WITH DIFFERENTIAL/PLATELET
BASOS ABS: 0 10*3/uL (ref 0.0–0.2)
Basos: 1 %
EOS (ABSOLUTE): 0.1 10*3/uL (ref 0.0–0.4)
Eos: 1 %
HEMATOCRIT: 34.2 % (ref 34.0–46.6)
Hemoglobin: 11.6 g/dL (ref 11.1–15.9)
IMMATURE GRANULOCYTES: 0 %
Immature Grans (Abs): 0 10*3/uL (ref 0.0–0.1)
LYMPHS ABS: 1.5 10*3/uL (ref 0.7–3.1)
Lymphs: 26 %
MCH: 31.9 pg (ref 26.6–33.0)
MCHC: 33.9 g/dL (ref 31.5–35.7)
MCV: 94 fL (ref 79–97)
MONOCYTES: 7 %
MONOS ABS: 0.4 10*3/uL (ref 0.1–0.9)
NEUTROS PCT: 65 %
Neutrophils Absolute: 3.9 10*3/uL (ref 1.4–7.0)
PLATELETS: 270 10*3/uL (ref 150–379)
RBC: 3.64 x10E6/uL — ABNORMAL LOW (ref 3.77–5.28)
RDW: 13.4 % (ref 12.3–15.4)
WBC: 5.9 10*3/uL (ref 3.4–10.8)

## 2017-09-16 LAB — BASIC METABOLIC PANEL
BUN / CREAT RATIO: 20 (ref 12–28)
BUN: 20 mg/dL (ref 8–27)
CHLORIDE: 102 mmol/L (ref 96–106)
CO2: 24 mmol/L (ref 20–29)
CREATININE: 0.98 mg/dL (ref 0.57–1.00)
Calcium: 9.3 mg/dL (ref 8.7–10.3)
GFR calc Af Amer: 71 mL/min/{1.73_m2} (ref >59)
GFR calc non Af Amer: 62 mL/min/{1.73_m2} (ref >59)
GLUCOSE: 106 mg/dL — AB (ref 65–99)
Potassium: 4.3 mmol/L (ref 3.5–5.2)
Sodium: 139 mmol/L (ref 134–144)

## 2017-09-22 ENCOUNTER — Ambulatory Visit (HOSPITAL_COMMUNITY): Payer: BLUE CROSS/BLUE SHIELD

## 2017-09-22 ENCOUNTER — Ambulatory Visit (HOSPITAL_COMMUNITY)
Admission: RE | Admit: 2017-09-22 | Discharge: 2017-09-22 | Disposition: A | Discharge status: Home / Self Care | Payer: BLUE CROSS/BLUE SHIELD | Source: Ambulatory Visit | Attending: Internal Medicine | Admitting: Internal Medicine

## 2017-09-22 DIAGNOSIS — I481 Persistent atrial fibrillation: Secondary | ICD-10-CM | POA: Diagnosis present

## 2017-09-22 DIAGNOSIS — I4891 Unspecified atrial fibrillation: Secondary | ICD-10-CM | POA: Diagnosis not present

## 2017-09-22 DIAGNOSIS — I4819 Other persistent atrial fibrillation: Secondary | ICD-10-CM

## 2017-09-22 MED ORDER — IOPAMIDOL (ISOVUE-370) INJECTION 76%
INTRAVENOUS | Status: AC
  Administered 2017-09-22: 80 mL via INTRAVENOUS
  Filled 2017-09-22: qty 100

## 2017-09-24 ENCOUNTER — Telehealth: Payer: Self-pay | Admitting: Internal Medicine

## 2017-09-24 ENCOUNTER — Ambulatory Visit (INDEPENDENT_AMBULATORY_CARE_PROVIDER_SITE_OTHER): Payer: BLUE CROSS/BLUE SHIELD | Admitting: *Deleted

## 2017-09-24 DIAGNOSIS — I639 Cerebral infarction, unspecified: Secondary | ICD-10-CM

## 2017-09-24 NOTE — Telephone Encounter (Signed)
Elaine Jackson is calling because she is unable to locate the instructions for her Ablation on tomorrow and is wanting you to call to give her the instructions . Thanks

## 2017-09-24 NOTE — Progress Notes (Signed)
Carelink Summary Report / Loop Recorder 

## 2017-09-24 NOTE — Telephone Encounter (Signed)
Returned call to Pt.   Pt ablation instructions given per AVS. Pt indicates understanding.

## 2017-09-25 ENCOUNTER — Encounter (HOSPITAL_COMMUNITY): Payer: Self-pay | Admitting: Certified Registered Nurse Anesthetist

## 2017-09-25 ENCOUNTER — Ambulatory Visit (HOSPITAL_COMMUNITY)
Admission: RE | Admit: 2017-09-25 | Discharge: 2017-09-25 | Disposition: A | Discharge status: Home / Self Care | Payer: BLUE CROSS/BLUE SHIELD | Source: Ambulatory Visit | Attending: Internal Medicine | Admitting: Internal Medicine

## 2017-09-25 ENCOUNTER — Ambulatory Visit (HOSPITAL_COMMUNITY): Payer: BLUE CROSS/BLUE SHIELD | Admitting: Certified Registered Nurse Anesthetist

## 2017-09-25 ENCOUNTER — Other Ambulatory Visit: Payer: Self-pay

## 2017-09-25 ENCOUNTER — Ambulatory Visit (HOSPITAL_COMMUNITY)
Admission: RE | Disposition: A | Discharge status: Home / Self Care | Payer: Self-pay | Source: Ambulatory Visit | Attending: Internal Medicine

## 2017-09-25 DIAGNOSIS — E785 Hyperlipidemia, unspecified: Secondary | ICD-10-CM | POA: Diagnosis not present

## 2017-09-25 DIAGNOSIS — I2510 Atherosclerotic heart disease of native coronary artery without angina pectoris: Secondary | ICD-10-CM | POA: Diagnosis not present

## 2017-09-25 DIAGNOSIS — Z881 Allergy status to other antibiotic agents status: Secondary | ICD-10-CM | POA: Insufficient documentation

## 2017-09-25 DIAGNOSIS — Z8673 Personal history of transient ischemic attack (TIA), and cerebral infarction without residual deficits: Secondary | ICD-10-CM | POA: Diagnosis not present

## 2017-09-25 DIAGNOSIS — E559 Vitamin D deficiency, unspecified: Secondary | ICD-10-CM | POA: Diagnosis not present

## 2017-09-25 DIAGNOSIS — I10 Essential (primary) hypertension: Secondary | ICD-10-CM | POA: Insufficient documentation

## 2017-09-25 DIAGNOSIS — I252 Old myocardial infarction: Secondary | ICD-10-CM | POA: Insufficient documentation

## 2017-09-25 DIAGNOSIS — E669 Obesity, unspecified: Secondary | ICD-10-CM | POA: Diagnosis not present

## 2017-09-25 DIAGNOSIS — Z85828 Personal history of other malignant neoplasm of skin: Secondary | ICD-10-CM | POA: Diagnosis not present

## 2017-09-25 DIAGNOSIS — I4892 Unspecified atrial flutter: Secondary | ICD-10-CM | POA: Diagnosis not present

## 2017-09-25 DIAGNOSIS — I480 Paroxysmal atrial fibrillation: Secondary | ICD-10-CM | POA: Diagnosis present

## 2017-09-25 DIAGNOSIS — Z7901 Long term (current) use of anticoagulants: Secondary | ICD-10-CM | POA: Insufficient documentation

## 2017-09-25 DIAGNOSIS — Z79890 Hormone replacement therapy (postmenopausal): Secondary | ICD-10-CM | POA: Insufficient documentation

## 2017-09-25 DIAGNOSIS — Z6836 Body mass index (BMI) 36.0-36.9, adult: Secondary | ICD-10-CM | POA: Insufficient documentation

## 2017-09-25 DIAGNOSIS — E119 Type 2 diabetes mellitus without complications: Secondary | ICD-10-CM | POA: Diagnosis not present

## 2017-09-25 DIAGNOSIS — Z79899 Other long term (current) drug therapy: Secondary | ICD-10-CM | POA: Insufficient documentation

## 2017-09-25 HISTORY — DX: Hypothyroidism, unspecified: E03.9

## 2017-09-25 HISTORY — PX: ATRIAL FIBRILLATION ABLATION: EP1191

## 2017-09-25 HISTORY — DX: Migraine, unspecified, not intractable, without status migrainosus: G43.909

## 2017-09-25 HISTORY — DX: Prediabetes: R73.03

## 2017-09-25 HISTORY — DX: Unspecified chronic bronchitis: J42

## 2017-09-25 HISTORY — DX: Acute myocardial infarction, unspecified: I21.9

## 2017-09-25 LAB — POCT ACTIVATED CLOTTING TIME
Activated Clotting Time: 252 seconds
Activated Clotting Time: 307 seconds
Activated Clotting Time: 318 seconds

## 2017-09-25 LAB — GLUCOSE, CAPILLARY: Glucose-Capillary: 154 mg/dL — ABNORMAL HIGH (ref 65–99)

## 2017-09-25 SURGERY — ATRIAL FIBRILLATION ABLATION
Anesthesia: General

## 2017-09-25 MED ORDER — FENTANYL CITRATE (PF) 100 MCG/2ML IJ SOLN
INTRAMUSCULAR | Status: DC | PRN
  Administered 2017-09-25 (×2): 50 ug via INTRAVENOUS
  Administered 2017-09-25: 100 ug via INTRAVENOUS

## 2017-09-25 MED ORDER — PROTAMINE SULFATE 10 MG/ML IV SOLN
INTRAVENOUS | Status: DC | PRN
  Administered 2017-09-25: 30 mg via INTRAVENOUS

## 2017-09-25 MED ORDER — EPHEDRINE SULFATE 50 MG/ML IJ SOLN
INTRAMUSCULAR | Status: DC | PRN
  Administered 2017-09-25: 5 mg via INTRAVENOUS
  Administered 2017-09-25: 10 mg via INTRAVENOUS

## 2017-09-25 MED ORDER — MIDAZOLAM HCL 5 MG/5ML IJ SOLN
INTRAMUSCULAR | Status: DC | PRN
  Administered 2017-09-25: 2 mg via INTRAVENOUS

## 2017-09-25 MED ORDER — HEPARIN SODIUM (PORCINE) 1000 UNIT/ML IJ SOLN
INTRAMUSCULAR | Status: AC
  Filled 2017-09-25: qty 1

## 2017-09-25 MED ORDER — IOPAMIDOL (ISOVUE-370) INJECTION 76%
INTRAVENOUS | Status: AC
  Filled 2017-09-25: qty 50

## 2017-09-25 MED ORDER — SODIUM CHLORIDE 0.9 % IV SOLN
INTRAVENOUS | Status: DC
  Administered 2017-09-25 (×4): via INTRAVENOUS

## 2017-09-25 MED ORDER — DEXTROSE 5 % IV SOLN
INTRAVENOUS | Status: DC | PRN
  Administered 2017-09-25: 10 ug/min via INTRAVENOUS

## 2017-09-25 MED ORDER — SUCCINYLCHOLINE CHLORIDE 20 MG/ML IJ SOLN
INTRAMUSCULAR | Status: DC | PRN
  Administered 2017-09-25: 100 mg via INTRAVENOUS

## 2017-09-25 MED ORDER — LEVOTHYROXINE SODIUM 100 MCG PO TABS: 100.0000 ug | ORAL_TABLET | Freq: Every day | ORAL | Status: DC

## 2017-09-25 MED ORDER — MIDAZOLAM HCL 2 MG/2ML IJ SOLN: 0.5000 mg | Freq: Once | INTRAMUSCULAR | Status: DC | PRN

## 2017-09-25 MED ORDER — HEPARIN SODIUM (PORCINE) 1000 UNIT/ML IJ SOLN
INTRAMUSCULAR | Status: AC
Start: 2017-09-25 — End: ?
  Filled 2017-09-25: qty 1

## 2017-09-25 MED ORDER — APIXABAN 5 MG PO TABS
5.0000 mg | ORAL_TABLET | Freq: Two times a day (BID) | ORAL | Status: DC
  Administered 2017-09-25: 5 mg via ORAL
  Filled 2017-09-25: qty 1

## 2017-09-25 MED ORDER — FUROSEMIDE 20 MG PO TABS: 20.0000 mg | ORAL_TABLET | Freq: Every day | ORAL | Status: DC

## 2017-09-25 MED ORDER — HEPARIN SODIUM (PORCINE) 1000 UNIT/ML IJ SOLN
INTRAMUSCULAR | Status: DC | PRN
  Administered 2017-09-25: 5000 [IU] via INTRAVENOUS
  Administered 2017-09-25: 4000 [IU] via INTRAVENOUS
  Administered 2017-09-25: 3000 [IU] via INTRAVENOUS

## 2017-09-25 MED ORDER — PHENYLEPHRINE HCL 10 MG/ML IJ SOLN
INTRAMUSCULAR | Status: DC | PRN
  Administered 2017-09-25: 40 ug via INTRAVENOUS

## 2017-09-25 MED ORDER — BUPIVACAINE HCL (PF) 0.25 % IJ SOLN
INTRAMUSCULAR | Status: AC
  Filled 2017-09-25: qty 30

## 2017-09-25 MED ORDER — SODIUM CHLORIDE 0.9% FLUSH: 3.0000 mL | INTRAVENOUS | Status: DC | PRN

## 2017-09-25 MED ORDER — DEXAMETHASONE SODIUM PHOSPHATE 4 MG/ML IJ SOLN
INTRAMUSCULAR | Status: DC | PRN
  Administered 2017-09-25: 10 mg via INTRAVENOUS

## 2017-09-25 MED ORDER — HEPARIN (PORCINE) IN NACL 2-0.9 UNIT/ML-% IJ SOLN
INTRAMUSCULAR | Status: AC
  Filled 2017-09-25: qty 500

## 2017-09-25 MED ORDER — BUPIVACAINE HCL (PF) 0.25 % IJ SOLN
INTRAMUSCULAR | Status: DC | PRN
  Administered 2017-09-25: 25 mL

## 2017-09-25 MED ORDER — OFF THE BEAT BOOK
Freq: Once | Status: DC
  Filled 2017-09-25: qty 1

## 2017-09-25 MED ORDER — ISOPROTERENOL HCL 0.2 MG/ML IJ SOLN
INTRAMUSCULAR | Status: AC
  Filled 2017-09-25: qty 5

## 2017-09-25 MED ORDER — HEPARIN (PORCINE) IN NACL 2-0.9 UNIT/ML-% IJ SOLN
INTRAMUSCULAR | Status: AC | PRN
  Administered 2017-09-25: 500 mL

## 2017-09-25 MED ORDER — SODIUM CHLORIDE 0.9% FLUSH
3.0000 mL | Freq: Two times a day (BID) | INTRAVENOUS | Status: DC
  Administered 2017-09-25: 3 mL via INTRAVENOUS

## 2017-09-25 MED ORDER — MEPERIDINE HCL 25 MG/ML IJ SOLN: 6.2500 mg | INTRAMUSCULAR | Status: DC | PRN

## 2017-09-25 MED ORDER — HEPARIN SODIUM (PORCINE) 1000 UNIT/ML IJ SOLN
INTRAMUSCULAR | Status: DC | PRN
  Administered 2017-09-25: 12000 [IU] via INTRAVENOUS
  Administered 2017-09-25 (×2): 1000 [IU] via INTRAVENOUS

## 2017-09-25 MED ORDER — PROMETHAZINE HCL 25 MG/ML IJ SOLN: 6.2500 mg | INTRAMUSCULAR | Status: DC | PRN

## 2017-09-25 MED ORDER — SODIUM CHLORIDE 0.9 % IV SOLN: 250.0000 mL | INTRAVENOUS | Status: DC | PRN

## 2017-09-25 MED ORDER — LIDOCAINE HCL (CARDIAC) 20 MG/ML IV SOLN
INTRAVENOUS | Status: DC | PRN
  Administered 2017-09-25: 80 mg via INTRAVENOUS

## 2017-09-25 MED ORDER — PROPOFOL 10 MG/ML IV BOLUS
INTRAVENOUS | Status: DC | PRN
  Administered 2017-09-25: 150 mg via INTRAVENOUS

## 2017-09-25 MED ORDER — PANTOPRAZOLE SODIUM 40 MG PO TBEC: 40.0000 mg | DELAYED_RELEASE_TABLET | Freq: Every day | ORAL | 1 refills | Status: DC

## 2017-09-25 MED ORDER — HYDROCODONE-ACETAMINOPHEN 5-325 MG PO TABS: 1.0000 | ORAL_TABLET | ORAL | Status: DC | PRN

## 2017-09-25 MED ORDER — ACETAMINOPHEN 325 MG PO TABS: 650.0000 mg | ORAL_TABLET | ORAL | Status: DC | PRN

## 2017-09-25 MED ORDER — IOPAMIDOL (ISOVUE-370) INJECTION 76%
INTRAVENOUS | Status: DC | PRN
  Administered 2017-09-25: 6 mL via INTRAVENOUS

## 2017-09-25 SURGICAL SUPPLY — 17 items
BAG SNAP BAND KOVER 36X36 (MISCELLANEOUS) ×2 IMPLANT
CATH MAPPNG PENTARAY F 2-6-2MM (CATHETERS) IMPLANT
CATH NAVISTAR SMARTTOUCH DF (ABLATOR) ×1 IMPLANT
CATH SOUNDSTAR 3D IMAGING (CATHETERS) ×1 IMPLANT
CATH WEBSTER BI DIR CS D-F CRV (CATHETERS) ×1 IMPLANT
NDL TRANSEP BRK 71CM 407200 (NEEDLE) IMPLANT
NEEDLE TRANSEP BRK 71CM 407200 (NEEDLE) ×2 IMPLANT
PACK EP LATEX FREE (CUSTOM PROCEDURE TRAY) ×2
PACK EP LF (CUSTOM PROCEDURE TRAY) IMPLANT
PAD DEFIB LIFELINK (PAD) ×2 IMPLANT
PATCH CARTO3 (PAD) ×1 IMPLANT
PENTARAY F 2-6-2MM (CATHETERS) ×2
SHEATH AVANTI 11CM 7FR (SHEATH) ×2 IMPLANT
SHEATH AVANTI 11CM 9FR (SHEATH) ×1 IMPLANT
SHEATH AVANTI 11F 11CM (SHEATH) ×1 IMPLANT
SHEATH SWARTZ TS SL2 63CM 8.5F (SHEATH) ×1 IMPLANT
TUBING SMART ABLATE COOLFLOW (TUBING) ×1 IMPLANT

## 2017-09-25 NOTE — Anesthesia Postprocedure Evaluation (Signed)
Anesthesia Post Note  Patient: Elaine Jackson  Procedure(s) Performed: ATRIAL FIBRILLATION ABLATION (N/A )     Patient location during evaluation: Cath Lab Anesthesia Type: General Level of consciousness: awake and alert, oriented and patient cooperative Pain management: pain level controlled Vital Signs Assessment: post-procedure vital signs reviewed and stable Respiratory status: spontaneous breathing, nonlabored ventilation and respiratory function stable Cardiovascular status: blood pressure returned to baseline and stable Postop Assessment: no apparent nausea or vomiting Anesthetic complications: no    Last Vitals:  Vitals:   09/25/17 1514 09/25/17 1515  BP:  136/86  Pulse:  75  Resp:  11  Temp: (!) 36.3 C   SpO2:  97%    Last Pain:  Vitals:   09/25/17 1350  TempSrc:   PainSc: 0-No pain                 Mehlani Blankenburg,E. Jamariah Tony

## 2017-09-25 NOTE — Transfer of Care (Signed)
Immediate Anesthesia Transfer of Care Note  Patient: Elaine Jackson  Procedure(s) Performed: ATRIAL FIBRILLATION ABLATION (N/A )  Patient Location: PACU  Anesthesia Type:General  Level of Consciousness: awake, alert  and oriented  Airway & Oxygen Therapy: Patient Spontanous Breathing and Patient connected to nasal cannula oxygen  Post-op Assessment: Report given to RN and Post -op Vital signs reviewed and stable  Post vital signs: Reviewed and stable  Last Vitals:  Vitals Value Taken Time  BP 163/87 09/25/2017  1:53 PM  Temp    Pulse 77 09/25/2017  1:55 PM  Resp 15 09/25/2017  1:55 PM  SpO2 96 % 09/25/2017  1:55 PM  Vitals shown include unvalidated device data.  Last Pain:  Vitals:   09/25/17 1350  TempSrc:   PainSc: 0-No pain         Complications: No apparent anesthesia complications

## 2017-09-25 NOTE — Interval H&P Note (Signed)
History and Physical Interval Note:  09/25/2017 8:00 AM  Elaine Jackson  has presented today for surgery, with the diagnosis of afib  The various methods of treatment have been discussed with the patient and family. After consideration of risks, benefits and other options for treatment, the patient has consented to  Procedure(s): ATRIAL FIBRILLATION ABLATION (N/A) as a surgical intervention .  The patient's history has been reviewed, patient examined, no change in status, stable for surgery.  I have reviewed the patient's chart and labs.  Questions were answered to the patient's satisfaction.    Reports compliance with eliquis without interruption Cardiac CT reviewed   Thompson Grayer

## 2017-09-25 NOTE — Anesthesia Procedure Notes (Signed)
Procedure Name: Intubation Date/Time: 09/25/2017 10:23 AM Performed by: Renleigh Ouellet T, CRNA Pre-anesthesia Checklist: Patient identified, Emergency Drugs available, Suction available and Patient being monitored Patient Re-evaluated:Patient Re-evaluated prior to induction Oxygen Delivery Method: Circle system utilized Preoxygenation: Pre-oxygenation with 100% oxygen Induction Type: IV induction Ventilation: Mask ventilation without difficulty Laryngoscope Size: Mac and 3 Grade View: Grade II Tube type: Oral Tube size: 7.5 mm Number of attempts: 1 Airway Equipment and Method: Patient positioned with wedge pillow and Stylet Placement Confirmation: ETT inserted through vocal cords under direct vision,  positive ETCO2 and breath sounds checked- equal and bilateral Secured at: 21 cm Tube secured with: Tape Dental Injury: Teeth and Oropharynx as per pre-operative assessment

## 2017-09-25 NOTE — Progress Notes (Signed)
Doing well post ablation Plan discharge when bedrest complete Routine follow up  Chanetta Marshall, NP 09/25/2017 5:34 PM

## 2017-09-25 NOTE — Discharge Instructions (Signed)
No driving for 4 days. No lifting over 5 lbs for 1 week. No sexual activity for 1 week. You may return to work in 1 week. Keep procedure site clean & dry. If you notice increased pain, swelling, bleeding or pus, call/return!  You may shower, but no soaking baths/hot tubs/pools for 1 week.  ° ° °You have an appointment set up with the [facility].  Multiple studies have shown that being followed by a dedicated atrial fibrillation clinic in addition to the standard care you receive from your other physicians improves health. We believe that enrollment in the atrial fibrillation clinic will allow us to better care for you.  ° °The phone number to the [facility] Clinic is [phone]. The clinic is staffed Monday through Friday from 8:30am to 5pm. ° °Parking Directions: The clinic is located in the Heart and Vascular Building connected to [facility]. °1)From [address] turn on to [address] and go to the 3rd entrance  (Heart and Vascular entrance) on the right. °2)Look to the right for Heart &Vascular Parking Garage. °3)A code for the entrance is required please call the clinic to receive this.   °4)Take the elevators to the 1st floor. Registration is in the room with the glass walls at the end of the hallway. ° °If you have any trouble parking or locating the clinic, please don’t hesitate to call [phone]. ° ° °

## 2017-09-25 NOTE — Progress Notes (Signed)
Patient ambulated in hallway after bedrest. Denies pain or discomfort. No bleeding noted. Discharge instructions given. D/C home with husband.

## 2017-09-25 NOTE — Progress Notes (Signed)
Site area: Right groin a 7, 9, and 11 french venous sheath was removed  Site Prior to Removal:  Level 0  Pressure Applied For 20 MINUTES    Bedrest Beginning at 1500p Manual:   Yes.    Patient Status During Pull:  stable  Post Pull Groin Site:  Level 0  Post Pull Instructions Given:  Yes.    Post Pull Pulses Present:  Yes.    Dressing Applied:  Yes.    Comments:  VS remain stable

## 2017-09-25 NOTE — Plan of Care (Signed)
Afib education provided. Meds discussed.

## 2017-09-25 NOTE — Anesthesia Preprocedure Evaluation (Addendum)
Anesthesia Evaluation  Patient identified by MRN, date of birth, ID band Patient awake    Reviewed: Allergy & Precautions, NPO status , Patient's Chart, lab work & pertinent test results  History of Anesthesia Complications Negative for: history of anesthetic complications  Airway Mallampati: I  TM Distance: >3 FB Neck ROM: Full    Dental  (+) Dental Advisory Given   Pulmonary COPD,  COPD inhaler, former smoker,    breath sounds clear to auscultation       Cardiovascular hypertension, Pt. on medications + CAD and + Past MI  + dysrhythmias Atrial Fibrillation  Rhythm:Regular Rate:Normal  '18 ECHO: EF 60-65%, valves OK   Neuro/Psych CVA, No Residual Symptoms    GI/Hepatic Neg liver ROS, S/p lap band   Endo/Other  diabetesHypothyroidism Morbid obesity  Renal/GU negative Renal ROS     Musculoskeletal   Abdominal (+) + obese,   Peds  Hematology   Anesthesia Other Findings   Reproductive/Obstetrics                            Anesthesia Physical Anesthesia Plan  ASA: III  Anesthesia Plan: General   Post-op Pain Management:    Induction: Intravenous  PONV Risk Score and Plan: 3 and Dexamethasone, Scopolamine patch - Pre-op and Diphenhydramine  Airway Management Planned: Oral ETT  Additional Equipment:   Intra-op Plan:   Post-operative Plan: Extubation in OR  Informed Consent: I have reviewed the patients History and Physical, chart, labs and discussed the procedure including the risks, benefits and alternatives for the proposed anesthesia with the patient or authorized representative who has indicated his/her understanding and acceptance.   Dental advisory given  Plan Discussed with: CRNA and Surgeon  Anesthesia Plan Comments: (Plan routine monitors, GETA)        Anesthesia Quick Evaluation

## 2017-09-26 ENCOUNTER — Encounter (HOSPITAL_COMMUNITY): Payer: Self-pay | Admitting: Internal Medicine

## 2017-09-26 LAB — POCT ACTIVATED CLOTTING TIME
ACTIVATED CLOTTING TIME: 175 s
Activated Clotting Time: 274 seconds

## 2017-09-26 MED FILL — Bupivacaine HCl Preservative Free (PF) Inj 0.25%: INTRAMUSCULAR | Qty: 30 | Status: AC

## 2017-09-29 LAB — CUP PACEART REMOTE DEVICE CHECK
Date Time Interrogation Session: 20190301150945
Implantable Pulse Generator Implant Date: 20180302

## 2017-09-30 ENCOUNTER — Encounter: Payer: Self-pay | Admitting: *Deleted

## 2017-09-30 ENCOUNTER — Encounter: Payer: Self-pay | Admitting: Internal Medicine

## 2017-10-01 NOTE — Telephone Encounter (Signed)
MRI clearance faxed to Dr. Noemi Chapel.

## 2017-10-13 ENCOUNTER — Other Ambulatory Visit: Payer: Self-pay | Admitting: Orthopedic Surgery

## 2017-10-13 DIAGNOSIS — M25562 Pain in left knee: Secondary | ICD-10-CM

## 2017-10-18 ENCOUNTER — Other Ambulatory Visit: Payer: Self-pay | Admitting: Nurse Practitioner

## 2017-10-18 ENCOUNTER — Ambulatory Visit
Admission: RE | Admit: 2017-10-18 | Discharge: 2017-10-18 | Disposition: A | Discharge status: Home / Self Care | Payer: BLUE CROSS/BLUE SHIELD | Source: Ambulatory Visit | Attending: Orthopedic Surgery | Admitting: Orthopedic Surgery

## 2017-10-18 DIAGNOSIS — M25562 Pain in left knee: Secondary | ICD-10-CM

## 2017-10-21 NOTE — Telephone Encounter (Signed)
Outpatient Medication Detail    Disp Refills Start End   pantoprazole (PROTONIX) 40 MG tablet 30 tablet 1 09/25/2017 09/25/2018   Sig - Route: Take 1 tablet (40 mg total) by mouth daily. - Oral   Sent to pharmacy as: pantoprazole (PROTONIX) 40 MG tablet   E-Prescribing Status: Receipt confirmed by pharmacy (09/25/2017 5:33 PM EDT)   Pharmacy   [id number] - , [place] - [address]

## 2017-10-27 ENCOUNTER — Ambulatory Visit (HOSPITAL_COMMUNITY)
Admission: RE | Admit: 2017-10-27 | Discharge: 2017-10-27 | Disposition: A | Discharge status: Home / Self Care | Payer: BLUE CROSS/BLUE SHIELD | Source: Ambulatory Visit | Attending: Nurse Practitioner | Admitting: Nurse Practitioner

## 2017-10-27 ENCOUNTER — Ambulatory Visit (INDEPENDENT_AMBULATORY_CARE_PROVIDER_SITE_OTHER): Payer: BLUE CROSS/BLUE SHIELD | Admitting: *Deleted

## 2017-10-27 ENCOUNTER — Encounter (HOSPITAL_COMMUNITY): Payer: Self-pay | Admitting: Nurse Practitioner

## 2017-10-27 VITALS — BP 124/74 | HR 76 | Ht 62.5 in | Wt 204.0 lb

## 2017-10-27 DIAGNOSIS — Z7901 Long term (current) use of anticoagulants: Secondary | ICD-10-CM | POA: Insufficient documentation

## 2017-10-27 DIAGNOSIS — Z79899 Other long term (current) drug therapy: Secondary | ICD-10-CM | POA: Diagnosis not present

## 2017-10-27 DIAGNOSIS — I480 Paroxysmal atrial fibrillation: Secondary | ICD-10-CM | POA: Diagnosis not present

## 2017-10-27 DIAGNOSIS — I10 Essential (primary) hypertension: Secondary | ICD-10-CM | POA: Diagnosis not present

## 2017-10-27 DIAGNOSIS — Z888 Allergy status to other drugs, medicaments and biological substances status: Secondary | ICD-10-CM | POA: Insufficient documentation

## 2017-10-27 DIAGNOSIS — I2510 Atherosclerotic heart disease of native coronary artery without angina pectoris: Secondary | ICD-10-CM | POA: Insufficient documentation

## 2017-10-27 DIAGNOSIS — Z87891 Personal history of nicotine dependence: Secondary | ICD-10-CM | POA: Diagnosis not present

## 2017-10-27 DIAGNOSIS — I639 Cerebral infarction, unspecified: Secondary | ICD-10-CM | POA: Diagnosis not present

## 2017-10-27 DIAGNOSIS — Z882 Allergy status to sulfonamides status: Secondary | ICD-10-CM | POA: Diagnosis not present

## 2017-10-27 NOTE — Progress Notes (Signed)
Carelink Summary Report / Loop Recorder 

## 2017-10-27 NOTE — Progress Notes (Signed)
Primary Care Physician: [name], MD Referring Physician: Dr. Royetta Crochet is a 62 y.o. female with a h/o afib,CAD, HTN, previous CVA, that is here for one month f/u ablation. She reports no swallowing or groin issues. No afib that she has noted. She feels well. She has a knee injury that has occurred since the ablation, seeing orthopedics.   Today, she denies symptoms of palpitations, chest pain, shortness of breath, orthopnea, PND, lower extremity edema, dizziness, presyncope, syncope, or neurologic sequela. The patient is tolerating medications without difficulties and is otherwise without complaint today.   Past Medical History:  Diagnosis Date  . Allergic rhinitis, cause unspecified   . Allergy   . CAD (coronary artery disease)    LHC 12/20/2014 showed small to moderate apical infarction due to distal LAD stenosis with reestablished TIMI2 flow. No PCI. Recommended plavix for 1 yr and ASA for life.   . Chicken pox   . Chronic bronchitis ([place])   . Family history of breast cancer in first degree relative    sister age 54  . Heart murmur   . Hyperlipidemia   . Hypertension   . Hypothyroidism   . Malignant neoplasm skin of face 02/16/2007   Basal Cell Carcinoma  [place].  . Measles   . Migraine <1982   "went away when I got pregnant" (09/25/2017)  . Myocardial infarction ([place]) 01/2015  . Obesity, unspecified   . Other chronic nonalcoholic liver disease   . Pre-diabetes   . Sleep related leg cramps   . Stroke (cerebrum) ([place])    due to large vessel disease; denies residual on 09/25/2017  . Thyroid nodule    10-2016   . Unspecified vitamin D deficiency   . Vision abnormalities    Past Surgical History:  Procedure Laterality Date  . ATRIAL FIBRILLATION ABLATION  09/25/2017  . ATRIAL FIBRILLATION ABLATION N/A 09/25/2017   Procedure: ATRIAL FIBRILLATION ABLATION;  Surgeon: Thompson Grayer, MD;  Location: [facility];  Service: Cardiovascular;  Laterality: N/A;    . CARDIAC CATHETERIZATION N/A 12/20/2014   Procedure: Left Heart Cath and Coronary Angiography;  Surgeon: Sanda Klein, MD;  Location: [facility];  Service: Cardiovascular;  Laterality: N/A;  . COLONOSCOPY  06/25/2007   [place]. Normal. Repeat in ten years.  . CYSTOSTOMY W/ BLADDER DILATION  1960s   "bladder stem stretched"  . LAPAROSCOPIC CHOLECYSTECTOMY    . LAPAROSCOPIC GASTRIC BANDING  07/03/10   Dr. Sherrin Daisy; [facility]  . LOOP RECORDER INSERTION N/A 08/23/2016   Procedure: Loop Recorder Insertion;  Surgeon: Deboraha Sprang, MD;  Location: [facility];  Service: Cardiovascular;  Laterality: N/A;  . SQUAMOUS CELL CARCINOMA EXCISION Left    neck  . THYROIDECTOMY N/A 12/17/2016   Procedure: TOTAL THYROIDECTOMY;  Surgeon: Excell Seltzer, MD;  Location: [facility];  Service: General;  Laterality: N/A;  . TONSILLECTOMY      Current Outpatient Medications  Medication Sig Dispense Refill  . apixaban (ELIQUIS) 5 MG TABS tablet Take 1 tablet (5 mg total) by mouth 2 (two) times daily. 180 tablet 2  . atorvastatin (LIPITOR) 80 MG tablet TAKE 1 TABLET (80 MG TOTAL) BY MOUTH DAILY AT 6 PM. (Patient taking differently: Take 80 mg by mouth daily. ) 90 tablet 3  . B COMPLEX-C-FOLIC ACID PO Take 1 tablet by mouth daily.    . cholecalciferol (VITAMIN D) 1000 units tablet Take 1 tablet (1,000 Units total) by mouth daily. (Patient  taking differently: Take 2,000 Units by mouth daily. )    . fluticasone (FLONASE) 50 MCG/ACT nasal spray Place 2 sprays into both nostrils daily as needed for allergies. 48 g 3  . furosemide (LASIX) 20 MG tablet Take 1 tablet (20 mg total) by mouth daily. (Patient taking differently: Take 20 mg by mouth daily as needed for edema. ) 90 tablet 0  . glucose blood test strip Use as instructed 100 each 3  . levothyroxine (SYNTHROID, LEVOTHROID) 100 MCG tablet TAKE 1 TABLET BY MOUTH EVERY DAY BEFORE BREAKFAST (Patient taking differently: Take 100 mcg by mouth daily before  breakfast. ) 90 tablet 3  . montelukast (SINGULAIR) 10 MG tablet TAKE 1 TABLET BY MOUTH EVERYDAY AT BEDTIME (Patient taking differently: Take 10 mg by mouth daily. ) 90 tablet 3  . pantoprazole (PROTONIX) 40 MG tablet Take 1 tablet (40 mg total) by mouth daily. 30 tablet 1  . potassium chloride SA (KLOR-CON M20) 20 MEQ tablet Take 1 tablet (20 mEq total) by mouth daily. (Patient taking differently: Take 20 mEq by mouth daily as needed (swelling). Take with lasix) 90 tablet 0  . diltiazem (CARDIZEM) 30 MG tablet Take 1 tablet every 4 hours AS NEEDED for rapid afib heart rate >100 (Patient not taking: Reported on 10/27/2017) 45 tablet 2   No current facility-administered medications for this encounter.     Allergies  Allergen Reactions  . Sulfa Antibiotics Nausea And Vomiting  . Zofran [Ondansetron Hcl] Other (See Comments)    Oral numbness with IV dosing.     Social History   Socioeconomic History  . Marital status: Married    Spouse name: Not on file  . Number of children: 1  . Years of education: Not on file  . Highest education level: Not on file  Occupational History  . Occupation: HAIR STYLIST    Employer: [employer]  . Financial resource strain: Not on file  . Food insecurity:    Worry: Not on file    Inability: Not on file  . Transportation needs:    Medical: Not on file    Non-medical: Not on file  Tobacco Use  . Smoking status: Former Smoker    Packs/day: 1.00    Years: 25.00    Pack years: 25.00    Types: Cigarettes    Last attempt to quit: 03/26/1995    Years since quitting: 22.6  . Smokeless tobacco: Never Used  Substance and Sexual Activity  . Alcohol use: No  . Drug use: No  . Sexual activity: Not Currently    Birth control/protection: Post-menopausal  Lifestyle  . Physical activity:    Days per week: Not on file    Minutes per session: Not on file  . Stress: Not on file  Relationships  . Social connections:    Talks on phone: Not on  file    Gets together: Not on file    Attends religious service: Not on file    Active member of club or organization: Not on file    Attends meetings of clubs or organizations: Not on file    Relationship status: Not on file  . Intimate partner violence:    Fear of current or ex partner: Not on file    Emotionally abused: Not on file    Physically abused: Not on file    Forced sexual activity: Not on file  Other Topics Concern  . Not on file  Social History Narrative  Marital status:  Married; x 27 years. Second marriage. 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      Tobacco:  None      Alcohol: rare/minimal.      Drugs:  None       Exercise:        Seatbelt:  100%.       Sunscreen: SPF 15.      Guns:  Maybe?    Family History  Problem Relation Age of Onset  . Diabetes Mother   . Heart disease Mother        defibrillator; carotid artery stenosis.  . Hypertension Mother   . Hyperlipidemia Mother   . Dementia Mother   . Congestive Heart Failure Mother   . Heart disease Father   . Kidney failure Father        ESRD/peritoneal dialysis  . Diabetes Father   . Hypertension Father   . Hyperlipidemia Father   . Stroke Father   . Kidney disease Father   . Congestive Heart Failure Father   . Diabetes Sister   . Hypertension Sister   . Kidney disease Sister        renal failure  . Diabetes Brother   . Hypertension Brother   . Hyperlipidemia Brother   . Arthritis Brother   . Cervical cancer Sister   . Hypertension Sister   . Liver cancer Sister 30  . Cancer Sister        Liver cancer  . Stroke Maternal Grandmother   . Heart disease Maternal Grandfather   . Stroke Paternal Grandmother   . Heart disease Paternal Grandmother   . Heart disease Paternal Grandfather   . Stroke Paternal Grandfather   . Breast cancer  Sister 54  . Hepatitis C Sister 55  . Thyroid disease Neg Hx     ROS- All systems are reviewed and negative except as per the HPI above  Physical Exam: Vitals:   10/27/17 0841  BP: 124/74  Pulse: 76  Weight: 204 lb (92.5 kg)  Height: 5' 2.5" (1.588 m)   Wt Readings from Last 3 Encounters:  10/27/17 204 lb (92.5 kg)  09/25/17 202 lb (91.6 kg)  09/15/17 200 lb 8 oz (90.9 kg)    Labs: Lab Results  Component Value Date   NA 139 09/15/2017   K 4.3 09/15/2017   CL 102 09/15/2017   CO2 24 09/15/2017   GLUCOSE 106 (H) 09/15/2017   BUN 20 09/15/2017   CREATININE 0.98 09/15/2017   CALCIUM 9.3 09/15/2017   Lab Results  Component Value Date   INR 0.97 08/20/2016   Lab Results  Component Value Date   CHOL 118 06/09/2017   HDL 56 06/09/2017   LDLCALC 51 06/09/2017   TRIG 54 06/09/2017     GEN- The patient is well appearing, alert and oriented x 3 today.   Head- normocephalic, atraumatic Eyes-  Sclera clear, conjunctiva pink Ears- hearing intact Oropharynx- clear Neck- supple, no JVP Lymph- no cervical lymphadenopathy Lungs- Clear to ausculation bilaterally, normal work of breathing Heart- Regular rate and rhythm, no murmurs, rubs or gallops, PMI not laterally displaced GI- soft, NT, ND, + BS Extremities- no clubbing, cyanosis, or edema MS- no significant deformity or atrophy Skin- no rash or lesion Psych- euthymic mood, full  affect Neuro- strength and sensation are intact  EKG-Sinus rhythm with sinus arrhythmia, pr int 164 ms, qrs int 82 ms, qtc 441 ms Epic records reviewed   Assessment and Plan:  1. Paroxysmal afib  S/p ablation and staying in SR Continue Eliquis 5 mg bid for chadsvasc score of at least 6  2. HTN Stabe  3. CAD  No anginal c/o's  F/u with Dr. Rayann Heman 7/8  Geroge Baseman. Rakeem Colley, [facility] [address] [place], [place] [address] [phone]

## 2017-10-29 LAB — CUP PACEART REMOTE DEVICE CHECK
Date Time Interrogation Session: 20190403153819
Implantable Pulse Generator Implant Date: 20180302

## 2017-10-30 ENCOUNTER — Other Ambulatory Visit (HOSPITAL_COMMUNITY): Payer: Self-pay | Admitting: Nurse Practitioner

## 2017-11-11 ENCOUNTER — Encounter: Payer: Self-pay | Admitting: Family Medicine

## 2017-11-18 ENCOUNTER — Ambulatory Visit: Payer: BLUE CROSS/BLUE SHIELD | Admitting: Emergency Medicine

## 2017-11-18 ENCOUNTER — Encounter: Payer: Self-pay | Admitting: Family Medicine

## 2017-11-18 ENCOUNTER — Encounter: Payer: Self-pay | Admitting: Emergency Medicine

## 2017-11-18 VITALS — BP 130/80 | HR 84 | Temp 98.2°F | Resp 17 | Ht 62.5 in | Wt 207.0 lb

## 2017-11-18 DIAGNOSIS — W57XXXA Bitten or stung by nonvenomous insect and other nonvenomous arthropods, initial encounter: Secondary | ICD-10-CM | POA: Diagnosis not present

## 2017-11-18 DIAGNOSIS — L089 Local infection of the skin and subcutaneous tissue, unspecified: Secondary | ICD-10-CM | POA: Diagnosis not present

## 2017-11-18 LAB — CUP PACEART REMOTE DEVICE CHECK
Date Time Interrogation Session: 20190506160835
MDC IDC PG IMPLANT DT: 20180302

## 2017-11-18 MED ORDER — DOXYCYCLINE HYCLATE 100 MG PO TABS: 100.0000 mg | ORAL_TABLET | Freq: Two times a day (BID) | ORAL | 0 refills | Status: DC

## 2017-11-18 NOTE — Patient Instructions (Addendum)
   IF you received an x-ray today, you will receive an invoice from [facility]. Please contact [facility] at [phone] with questions or concerns regarding your invoice.   IF you received labwork today, you will receive an invoice from [facility]. Please contact [facility] at [phone] with questions or concerns regarding your invoice.   Our billing staff will not be able to assist you with questions regarding bills from these companies.  You will be contacted with the lab results as soon as they are available. The fastest way to get your results is to activate your My Chart account. Instructions are located on the last page of this paperwork. If you have not heard from us regarding the results in 2 weeks, please contact this office.     Tick Bite Information, Adult Ticks are insects that can bite. Most ticks live in shrubs and grassy areas. They climb onto people and animals that go by. Then they bite. Some ticks carry germs that can make you sick. How can I prevent tick bites?  Use an insect repellent that has 20% or higher of the ingredients DEET, picaridin, or IR3535. Put this insect repellent on: ? Bare skin. ? The tops of your boots. ? Your pant legs. ? The ends of your sleeves.  If you use an insect repellent that has the ingredient permethrin, make sure to follow the instructions on the bottle. Treat the following: ? Clothing. ? Supplies. ? Boots. ? Tents.  Wear long sleeves, long pants, and light colors.  Tuck your pant legs into your socks.  Stay in the middle of the trail.  Try not to walk through long grass.  Before going inside your house, check your clothes, hair, and skin for ticks. Make sure to check your head, neck, armpits, waist, groin, and joint areas.  Check for ticks every day.  When you come indoors: ? Wash your clothes right away. ? Shower right away. ? Dry your clothes in a dryer on high heat for 60 minutes or more. What is  the right way to remove a tick? Remove a tick from your skin as soon as possible.  To remove a tick that is crawling on your skin: ? Go outdoors and brush the tick off. ? Use tape or a lint roller.  To remove a tick that is biting: ? Wash your hands. ? If you have latex gloves, put them on. ? Use tweezers, curved forceps, or a tick-removal tool to grasp the tick. Grasp the tick as close to your skin and as close to the tick's head as possible. ? Gently pull up until the tick lets go.  Try to keep the tick's head attached to its body.  Do not twist or jerk the tick.  Do not squeeze or crush the tick.  Do not try to remove a tick with heat, alcohol, petroleum jelly, or fingernail polish. How should I get rid of a tick? Here are some ways to get rid of a tick that is alive:  Place the tick in rubbing alcohol.  Place the tick in a bag or container you can close tightly.  Wrap the tick tightly in tape.  Flush the tick down the toilet.  Contact a doctor if:  You have symptoms of a disease, such as: ? Pain in a muscle, joint, or bone. ? Trouble walking or moving your legs. ? Numbness in your legs. ? Inability to move (paralysis). ? A red rash that makes a circle (  bull's-eye rash). ? Redness and swelling where the tick bit you. ? A fever. ? Throwing up (vomiting) over and over. ? Diarrhea. ? Weight loss. ? Tender and swollen lymph glands. ? Shortness of breath. ? Cough. ? Belly pain (abdominal pain). ? Headache. ? Being more tired than normal. ? A change in how alert (conscious) you are. ? Confusion. Get help right away if:  You cannot remove a tick.  A part of a tick breaks off and gets stuck in your skin.  You are feeling worse. Summary  Ticks may carry germs that can make you sick.  To prevent tick bites, wear long sleeves, long pants, and light colors. Use insect repellent. Follow the instructions on the bottle.  If the tick is biting, do not try to remove  it with heat, alcohol, petroleum jelly, or fingernail polish.  Use tweezers, curved forceps, or a tick-removal tool to grasp the tick. Gently pull up until the tick lets go. Do not twist or jerk the tick. Do not squeeze or crush the tick.  If you have symptoms, contact a doctor. This information is not intended to replace advice given to you by your health care provider. Make sure you discuss any questions you have with your health care provider. Document Released: 09/04/2009 Document Revised: 09/20/2016 Document Reviewed: 09/20/2016 Elsevier Interactive Patient Education  2018 [employer]  

## 2017-11-18 NOTE — Progress Notes (Signed)
Elaine Jackson 62 y.o.   Chief Complaint  Patient presents with  . Insect Bite    HISTORY OF PRESENT ILLNESS: This is a 62 y.o. female complaining of rash to her left upper back that initially looked like a bull's-eye.  Was bit by ticks on her legs.  Possible she was also bit on her back.  Asymptomatic.  HPI   Prior to Admission medications   Medication Sig Start Date End Date Taking? Authorizing Provider  atorvastatin (LIPITOR) 80 MG tablet TAKE 1 TABLET (80 MG TOTAL) BY MOUTH DAILY AT 6 PM. Patient taking differently: Take 80 mg by mouth daily.  06/09/17  Yes [name], MD  B COMPLEX-C-FOLIC ACID PO Take 1 tablet by mouth daily.   Yes [provider]  cholecalciferol (VITAMIN D) 1000 units tablet Take 1 tablet (1,000 Units total) by mouth daily. Patient taking differently: Take 2,000 Units by mouth daily.  08/26/16  Yes Love, Ivan Anchors, PA-C  diltiazem (CARDIZEM) 30 MG tablet Take 1 tablet every 4 hours AS NEEDED for rapid afib heart rate >100 10/23/16  Yes Sherran Needs, NP  ELIQUIS 5 MG TABS tablet TAKE 1 TABLET BY MOUTH TWICE A DAY 10/30/17  Yes Sherran Needs, NP  fluticasone (FLONASE) 50 MCG/ACT nasal spray Place 2 sprays into both nostrils daily as needed for allergies. 06/09/17  Yes [name], MD  furosemide (LASIX) 20 MG tablet Take 1 tablet (20 mg total) by mouth daily. Patient taking differently: Take 20 mg by mouth daily as needed for edema.  08/13/17  Yes [name], MD  glucose blood test strip Use as instructed 06/09/17  Yes [name], MD  levothyroxine (SYNTHROID, LEVOTHROID) 100 MCG tablet TAKE 1 TABLET BY MOUTH EVERY DAY BEFORE BREAKFAST Patient taking differently: Take 100 mcg by mouth daily before breakfast.  06/09/17  Yes [name], MD  montelukast (SINGULAIR) 10 MG tablet TAKE 1 TABLET BY MOUTH EVERYDAY AT BEDTIME Patient taking differently: Take 10 mg by mouth daily.  06/09/17  Yes [name], MD  pantoprazole (PROTONIX) 40  MG tablet Take 1 tablet (40 mg total) by mouth daily. 09/25/17 09/25/18 Yes Seiler, Amber K, NP  potassium chloride SA (KLOR-CON M20) 20 MEQ tablet Take 1 tablet (20 mEq total) by mouth daily. Patient taking differently: Take 20 mEq by mouth daily as needed (swelling). Take with lasix 08/13/17  Yes [name], MD    Allergies  Allergen Reactions  . Sulfa Antibiotics Nausea And Vomiting  . Zofran [Ondansetron Hcl] Other (See Comments)    Oral numbness with IV dosing.     Patient Active Problem List   Diagnosis Date Noted  . Hypothyroidism 12/24/2016  . Hypocalcemia 12/24/2016  . Thyroid nodule 12/17/2016  . S/P complete thyroidectomy 12/17/2016  . Paroxysmal atrial fibrillation ([place]) 10/14/2016  . History of cerebellar stroke 09/16/2016  . Leukocytosis   . Cerebellar stroke ([place]) 08/23/2016  . Neurologic gait disorder   . Slow transit constipation   . Acute CVA (cerebrovascular accident) ([place]) 08/21/2016  . Vertebral artery dissection ([place]) 08/20/2016  . Acute kidney injury ([place]) 08/20/2016  . Left thyroid nodule 08/20/2016  . Bradycardia 08/20/2016  . Abnormal urinalysis 08/20/2016  . Diabetes mellitus with complication ([place])   . Vertebrobasilar occlusive disease   . Seasonal allergic rhinitis due to pollen 05/20/2016  . Secondary pulmonary hypertension 08/28/2015  . Old MI (myocardial infarction) 08/28/2015  . Ischemic heart disease 08/28/2015  . Coronary  artery disease involving native coronary artery of native heart without angina pectoris 05/15/2015  . Migraine 12/21/2014  . Vitamin D deficiency 01/20/2013  . Nail dystrophy 09/14/2012  . Essential hypertension, benign 04/16/2012  . Dyslipidemia 04/16/2012  . Class 2 obesity due to excess calories with serious comorbidity and body mass index (BMI) of 36.0 to 36.9 in adult 04/16/2012  . Hx of laparoscopic gastric banding 02/13/2011    Past Medical History:  Diagnosis Date  . Allergic rhinitis, cause unspecified   .  Allergy   . CAD (coronary artery disease)    LHC 12/20/2014 showed small to moderate apical infarction due to distal LAD stenosis with reestablished TIMI2 flow. No PCI. Recommended plavix for 1 yr and ASA for life.   . Chicken pox   . Chronic bronchitis ([place])   . Family history of breast cancer in first degree relative    sister age 24  . Heart murmur   . Hyperlipidemia   . Hypertension   . Hypothyroidism   . Malignant neoplasm skin of face 02/16/2007   Basal Cell Carcinoma  [place].  . Measles   . Migraine <1982   "went away when I got pregnant" (09/25/2017)  . Myocardial infarction ([place]) 01/2015  . Obesity, unspecified   . Other chronic nonalcoholic liver disease   . Pre-diabetes   . Sleep related leg cramps   . Stroke (cerebrum) ([place])    due to large vessel disease; denies residual on 09/25/2017  . Thyroid nodule    10-2016   . Unspecified vitamin D deficiency   . Vision abnormalities     Past Surgical History:  Procedure Laterality Date  . ATRIAL FIBRILLATION ABLATION  09/25/2017  . ATRIAL FIBRILLATION ABLATION N/A 09/25/2017   Procedure: ATRIAL FIBRILLATION ABLATION;  Surgeon: Thompson Grayer, MD;  Location: [facility];  Service: Cardiovascular;  Laterality: N/A;  . CARDIAC CATHETERIZATION N/A 12/20/2014   Procedure: Left Heart Cath and Coronary Angiography;  Surgeon: Sanda Klein, MD;  Location: [facility];  Service: Cardiovascular;  Laterality: N/A;  . COLONOSCOPY  06/25/2007   [place]. Normal. Repeat in ten years.  . CYSTOSTOMY W/ BLADDER DILATION  1960s   "bladder stem stretched"  . LAPAROSCOPIC CHOLECYSTECTOMY    . LAPAROSCOPIC GASTRIC BANDING  07/03/10   Dr. Sherrin Daisy; [facility]  . LOOP RECORDER INSERTION N/A 08/23/2016   Procedure: Loop Recorder Insertion;  Surgeon: Deboraha Sprang, MD;  Location: [facility];  Service: Cardiovascular;  Laterality: N/A;  . SQUAMOUS CELL CARCINOMA EXCISION Left    neck  . THYROIDECTOMY N/A 12/17/2016   Procedure:  TOTAL THYROIDECTOMY;  Surgeon: Excell Seltzer, MD;  Location: [facility];  Service: General;  Laterality: N/A;  . TONSILLECTOMY      Social History   Socioeconomic History  . Marital status: Married    Spouse name: Not on file  . Number of children: 1  . Years of education: Not on file  . Highest education level: Not on file  Occupational History  . Occupation: HAIR STYLIST    Employer: [employer]  . Financial resource strain: Not on file  . Food insecurity:    Worry: Not on file    Inability: Not on file  . Transportation needs:    Medical: Not on file    Non-medical: Not on file  Tobacco Use  . Smoking status: Former Smoker    Packs/day: 1.00    Years: 25.00    Pack years:  25.00    Types: Cigarettes    Last attempt to quit: 03/26/1995    Years since quitting: 22.6  . Smokeless tobacco: Never Used  Substance and Sexual Activity  . Alcohol use: No  . Drug use: No  . Sexual activity: Not Currently    Birth control/protection: Post-menopausal  Lifestyle  . Physical activity:    Days per week: Not on file    Minutes per session: Not on file  . Stress: Not on file  Relationships  . Social connections:    Talks on phone: Not on file    Gets together: Not on file    Attends religious service: Not on file    Active member of club or organization: Not on file    Attends meetings of clubs or organizations: Not on file    Relationship status: Not on file  . Intimate partner violence:    Fear of current or ex partner: Not on file    Emotionally abused: Not on file    Physically abused: Not on file    Forced sexual activity: Not on file  Other Topics Concern  . Not on file  Social History Narrative   Marital status:  Married; x 27 years. Second marriage. 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       Tobacco:  None      Alcohol: rare/minimal.      Drugs:  None       Exercise:        Seatbelt:  100%.       Sunscreen: SPF 15.      Guns:  Maybe?    Family History  Problem Relation Age of Onset  . Diabetes Mother   . Heart disease Mother        defibrillator; carotid artery stenosis.  . Hypertension Mother   . Hyperlipidemia Mother   . Dementia Mother   . Congestive Heart Failure Mother   . Heart disease Father   . Kidney failure Father        ESRD/peritoneal dialysis  . Diabetes Father   . Hypertension Father   . Hyperlipidemia Father   . Stroke Father   . Kidney disease Father   . Congestive Heart Failure Father   . Diabetes Sister   . Hypertension Sister   . Kidney disease Sister        renal failure  . Diabetes Brother   . Hypertension Brother   . Hyperlipidemia Brother   . Arthritis Brother   . Cervical cancer Sister   . Hypertension Sister   . Liver cancer Sister 34  . Cancer Sister        Liver cancer  . Stroke Maternal Grandmother   . Heart disease Maternal Grandfather   . Stroke Paternal Grandmother   . Heart disease Paternal Grandmother   . Heart disease Paternal Grandfather   . Stroke Paternal Grandfather   . Breast cancer Sister 32  . Hepatitis C Sister 67  . Thyroid disease Neg Hx      Review of Systems  Constitutional: Negative.  Negative for chills and fever.  HENT: Negative.  Negative for sinus pain and sore throat.   Eyes: Negative.  Negative for blurred vision and double vision.  Respiratory: Negative.  Negative for cough and  shortness of breath.   Cardiovascular: Negative.  Negative for chest pain and palpitations.  Gastrointestinal: Negative.  Negative for abdominal pain, heartburn and nausea.  Genitourinary: Negative.  Negative for dysuria.  Skin: Positive for rash.  Neurological: Negative.  Negative for dizziness and headaches.  All other systems reviewed and are negative.    Vitals:   11/18/17 1432  BP: 130/80  Pulse: 84    Resp: 17  Temp: 98.2 F (36.8 C)  SpO2: 98%     Physical Exam  Constitutional: She is oriented to person, place, and time. She appears well-developed and well-nourished.  HENT:  Head: Normocephalic and atraumatic.  Nose: Nose normal.  Mouth/Throat: Oropharynx is clear and moist.  Eyes: Pupils are equal, round, and reactive to light. Conjunctivae and EOM are normal.  Neck: Normal range of motion. Neck supple.  Cardiovascular: Normal rate and regular rhythm.  Pulmonary/Chest: Effort normal.  Neurological: She is alert and oriented to person, place, and time.  Skin: Capillary refill takes less than 2 seconds.  Left scapular area: Small area of erythema compatible with infected insect bite.  Psychiatric: She has a normal mood and affect. Her behavior is normal.  Vitals reviewed.    ASSESSMENT & PLAN:  Artemis was seen today for insect bite.  Diagnoses and all orders for this visit:  Skin infection -     doxycycline (VIBRA-TABS) 100 MG tablet; Take 1 tablet (100 mg total) by mouth 2 (two) times daily.  Tick bite, initial encounter    Patient Instructions       IF you received an x-ray today, you will receive an invoice from [facility]. Please contact [facility] at [phone] with questions or concerns regarding your invoice.   IF you received labwork today, you will receive an invoice from [facility]. Please contact [facility] at [phone] with questions or concerns regarding your invoice.   Our billing staff will not be able to assist you with questions regarding bills from these companies.  You will be contacted with the lab results as soon as they are available. The fastest way to get your results is to activate your My Chart account. Instructions are located on the last page of this paperwork. If you have not heard from [place] regarding the results in 2 weeks, please contact this office.      Tick Bite Information, Adult Ticks are insects that  can bite. Most ticks live in shrubs and grassy areas. They climb onto people and animals that go by. Then they bite. Some ticks carry germs that can make you sick. How can I prevent tick bites?  Use an insect repellent that has 20% or higher of the ingredients DEET, picaridin, or IR3535. Put this insect repellent on: ? Bare skin. ? The tops of your boots. ? Your pant legs. ? The ends of your sleeves.  If you use an insect repellent that has the ingredient permethrin, make sure to follow the instructions on the bottle. Treat the following: ? Clothing. ? Supplies. ? Boots. ? Tents.  Wear long sleeves, long pants, and light colors.  Tuck your pant legs into your socks.  Stay in the middle of the trail.  Try not to walk through long grass.  Before going inside your house, check your clothes, hair, and skin for ticks. Make sure to check your head, neck, armpits, waist, groin, and joint areas.  Check for ticks every day.  When you come indoors: ? Wash your clothes right away. ? Shower right  away. ? Dry your clothes in a dryer on high heat for 60 minutes or more. What is the right way to remove a tick? Remove a tick from your skin as soon as possible.  To remove a tick that is crawling on your skin: ? Go outdoors and brush the tick off. ? Use tape or a lint roller.  To remove a tick that is biting: ? Wash your hands. ? If you have latex gloves, put them on. ? Use tweezers, curved forceps, or a tick-removal tool to grasp the tick. Grasp the tick as close to your skin and as close to the tick's head as possible. ? Gently pull up until the tick lets go.  Try to keep the tick's head attached to its body.  Do not twist or jerk the tick.  Do not squeeze or crush the tick.  Do not try to remove a tick with heat, alcohol, petroleum jelly, or fingernail polish. How should I get rid of a tick? Here are some ways to get rid of a tick that is alive:  Place the tick in rubbing  alcohol.  Place the tick in a bag or container you can close tightly.  Wrap the tick tightly in tape.  Flush the tick down the toilet.  Contact a doctor if:  You have symptoms of a disease, such as: ? Pain in a muscle, joint, or bone. ? Trouble walking or moving your legs. ? Numbness in your legs. ? Inability to move (paralysis). ? A red rash that makes a circle (bull's-eye rash). ? Redness and swelling where the tick bit you. ? A fever. ? Throwing up (vomiting) over and over. ? Diarrhea. ? Weight loss. ? Tender and swollen lymph glands. ? Shortness of breath. ? Cough. ? Belly pain (abdominal pain). ? Headache. ? Being more tired than normal. ? A change in how alert (conscious) you are. ? Confusion. Get help right away if:  You cannot remove a tick.  A part of a tick breaks off and gets stuck in your skin.  You are feeling worse. Summary  Ticks may carry germs that can make you sick.  To prevent tick bites, wear long sleeves, long pants, and light colors. Use insect repellent. Follow the instructions on the bottle.  If the tick is biting, do not try to remove it with heat, alcohol, petroleum jelly, or fingernail polish.  Use tweezers, curved forceps, or a tick-removal tool to grasp the tick. Gently pull up until the tick lets go. Do not twist or jerk the tick. Do not squeeze or crush the tick.  If you have symptoms, contact a doctor. This information is not intended to replace advice given to you by your health care provider. Make sure you discuss any questions you have with your health care provider. Document Released: 09/04/2009 Document Revised: 09/20/2016 Document Reviewed: 09/20/2016 Elsevier Interactive Patient Education  2018 [employer]     Agustina Caroli, MD [facility]

## 2017-11-30 ENCOUNTER — Encounter (HOSPITAL_COMMUNITY): Payer: Self-pay | Admitting: Emergency Medicine

## 2017-11-30 ENCOUNTER — Ambulatory Visit (HOSPITAL_COMMUNITY)
Admission: EM | Admit: 2017-11-30 | Discharge: 2017-11-30 | Disposition: A | Discharge status: Home / Self Care | Payer: BLUE CROSS/BLUE SHIELD | Attending: Family Medicine | Admitting: Family Medicine

## 2017-11-30 DIAGNOSIS — J209 Acute bronchitis, unspecified: Secondary | ICD-10-CM | POA: Diagnosis not present

## 2017-11-30 MED ORDER — DOXYCYCLINE HYCLATE 100 MG PO CAPS: 100.0000 mg | ORAL_CAPSULE | Freq: Two times a day (BID) | ORAL | 0 refills | Status: DC

## 2017-11-30 MED ORDER — PREDNISONE 10 MG PO TABS: 20.0000 mg | ORAL_TABLET | Freq: Every day | ORAL | 0 refills | Status: DC

## 2017-11-30 NOTE — ED Provider Notes (Signed)
[place]    CSN: [id number] Arrival date & time: 11/30/17  1536     History   Chief Complaint Chief Complaint  Patient presents with  . Cough    HPI Elaine Jackson is a 62 y.o. female.   Patient has a productive cough.  There may have been some fever but unsure.  Complains of postnasal drainage and rattling.  She takes Singulair chronically for allergy symptoms.  She describes some burning in her chest.  HPI  Past Medical History:  Diagnosis Date  . Allergic rhinitis, cause unspecified   . Allergy   . CAD (coronary artery disease)    LHC 12/20/2014 showed small to moderate apical infarction due to distal LAD stenosis with reestablished TIMI2 flow. No PCI. Recommended plavix for 1 yr and ASA for life.   . Chicken pox   . Chronic bronchitis ([place])   . Family history of breast cancer in first degree relative    sister age 10  . Heart murmur   . Hyperlipidemia   . Hypertension   . Hypothyroidism   . Malignant neoplasm skin of face 02/16/2007   Basal Cell Carcinoma  [place].  . Measles   . Migraine <1982   "went away when I got pregnant" (09/25/2017)  . Myocardial infarction ([place]) 01/2015  . Obesity, unspecified   . Other chronic nonalcoholic liver disease   . Pre-diabetes   . Sleep related leg cramps   . Stroke (cerebrum) ([place])    due to large vessel disease; denies residual on 09/25/2017  . Thyroid nodule    10-2016   . Unspecified vitamin D deficiency   . Vision abnormalities     Patient Active Problem List   Diagnosis Date Noted  . Tick bite 11/18/2017  . Skin infection 11/18/2017  . Hypothyroidism 12/24/2016  . Hypocalcemia 12/24/2016  . Thyroid nodule 12/17/2016  . S/P complete thyroidectomy 12/17/2016  . Paroxysmal atrial fibrillation ([place]) 10/14/2016  . History of cerebellar stroke 09/16/2016  . Leukocytosis   . Cerebellar stroke ([place]) 08/23/2016  . Neurologic gait disorder   . Slow transit constipation   . Acute CVA (cerebrovascular  accident) ([place]) 08/21/2016  . 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 Acute kidney injury ([place]) 08/20/2016  . Left thyroid nodule 08/20/2016  . Bradycardia 08/20/2016  . Abnormal urinalysis 08/20/2016  . Diabetes mellitus with complication ([place])   . Vertebrobasilar occlusive disease   . Seasonal allergic rhinitis due to pollen 05/20/2016  . Secondary pulmonary hypertension 08/28/2015  . Old MI (myocardial infarction) 08/28/2015  . Ischemic heart disease 08/28/2015  . Coronary artery disease involving native coronary artery of native heart without angina pectoris 05/15/2015  . Migraine 12/21/2014  . Vitamin D deficiency 01/20/2013  . Nail dystrophy 09/14/2012  . Essential hypertension, benign 04/16/2012  . Dyslipidemia 04/16/2012  . Class 2 obesity due to excess calories with serious comorbidity and body mass index (BMI) of 36.0 to 36.9 in adult 04/16/2012  . Hx of laparoscopic gastric banding 02/13/2011    Past Surgical History:  Procedure Laterality Date  . ATRIAL FIBRILLATION ABLATION  09/25/2017  . ATRIAL FIBRILLATION ABLATION N/A 09/25/2017   Procedure: ATRIAL FIBRILLATION ABLATION;  Surgeon: Thompson Grayer, MD;  Location: [facility];  Service: Cardiovascular;  Laterality: N/A;  . CARDIAC CATHETERIZATION N/A 12/20/2014   Procedure: Left Heart Cath and Coronary Angiography;  Surgeon: Sanda Klein, MD;  Location: [facility];  Service: Cardiovascular;  Laterality: N/A;  . COLONOSCOPY  06/25/2007   [place]. Normal. Repeat in ten years.  . CYSTOSTOMY W/ BLADDER DILATION  1960s   "bladder stem stretched"  . LAPAROSCOPIC CHOLECYSTECTOMY    . LAPAROSCOPIC GASTRIC BANDING  07/03/10   Dr. Sherrin Daisy; [facility]  . LOOP RECORDER INSERTION N/A 08/23/2016   Procedure: Loop Recorder Insertion;  Surgeon: Deboraha Sprang, MD;  Location: [facility];  Service: Cardiovascular;  Laterality: N/A;  . SQUAMOUS CELL CARCINOMA EXCISION Left    neck  . THYROIDECTOMY N/A  12/17/2016   Procedure: TOTAL THYROIDECTOMY;  Surgeon: Excell Seltzer, MD;  Location: [facility];  Service: General;  Laterality: N/A;  . TONSILLECTOMY      OB History   None      Home Medications    Prior to Admission medications   Medication Sig Start Date End Date Taking? Authorizing Provider  atorvastatin (LIPITOR) 80 MG tablet TAKE 1 TABLET (80 MG TOTAL) BY MOUTH DAILY AT 6 PM. Patient taking differently: Take 80 mg by mouth daily.  06/09/17   [name], MD  B COMPLEX-C-FOLIC ACID PO Take 1 tablet by mouth daily.    [provider]  cholecalciferol (VITAMIN D) 1000 units tablet Take 1 tablet (1,000 Units total) by mouth daily. Patient taking differently: Take 2,000 Units by mouth daily.  08/26/16   Love, Ivan Anchors, PA-C  diltiazem (CARDIZEM) 30 MG tablet Take 1 tablet every 4 hours AS NEEDED for rapid afib heart rate >100 10/23/16   Sherran Needs, NP  doxycycline (VIBRA-TABS) 100 MG tablet Take 1 tablet (100 mg total) by mouth 2 (two) times daily. 11/18/17   Horald Pollen, MD  ELIQUIS 5 MG TABS tablet TAKE 1 TABLET BY MOUTH TWICE A DAY 10/30/17   Sherran Needs, NP  fluticasone [facility]) 50 MCG/ACT nasal spray Place 2 sprays into both nostrils daily as needed for allergies. 06/09/17   [name], MD  furosemide (LASIX) 20 MG tablet Take 1 tablet (20 mg total) by mouth daily. Patient taking differently: Take 20 mg by mouth daily as needed for edema.  08/13/17   [name], MD  glucose blood test strip Use as instructed 06/09/17   [name], MD  levothyroxine (SYNTHROID, LEVOTHROID) 100 MCG tablet TAKE 1 TABLET BY MOUTH EVERY DAY BEFORE BREAKFAST Patient taking differently: Take 100 mcg by mouth daily before breakfast.  06/09/17   [name], MD  montelukast (SINGULAIR) 10 MG tablet TAKE 1 TABLET BY MOUTH EVERYDAY AT BEDTIME Patient taking differently: Take 10 mg by mouth daily.  06/09/17   [name], MD  pantoprazole (PROTONIX) 40 MG  tablet Take 1 tablet (40 mg total) by mouth daily. 09/25/17 09/25/18  Chanetta Marshall K, NP  potassium chloride SA (KLOR-CON M20) 20 MEQ tablet Take 1 tablet (20 mEq total) by mouth daily. Patient taking differently: Take 20 mEq by mouth daily as needed (swelling). Take with lasix 08/13/17   [name], MD    Family History Family History  Problem Relation Age of Onset  . Diabetes Mother   . Heart disease Mother        defibrillator; carotid artery stenosis.  . Hypertension Mother   . Hyperlipidemia Mother   . Dementia Mother   . Congestive Heart Failure Mother   . Heart disease Father   . Kidney failure Father        ESRD/peritoneal dialysis  . Diabetes Father   . Hypertension Father   . Hyperlipidemia Father   .  Stroke Father   . Kidney disease Father   . Congestive Heart Failure Father   . Diabetes Sister   . Hypertension Sister   . Kidney disease Sister        renal failure  . Diabetes Brother   . Hypertension Brother   . Hyperlipidemia Brother   . Arthritis Brother   . Cervical cancer Sister   . Hypertension Sister   . Liver cancer Sister 71  . Cancer Sister        Liver cancer  . Stroke Maternal Grandmother   . Heart disease Maternal Grandfather   . Stroke Paternal Grandmother   . Heart disease Paternal Grandmother   . Heart disease Paternal Grandfather   . Stroke Paternal Grandfather   . Breast cancer Sister 27  . Hepatitis C Sister 61  . Thyroid disease Neg Hx     Social History Social History   Tobacco Use  . Smoking status: Former Smoker    Packs/day: 1.00    Years: 25.00    Pack years: 25.00    Types: Cigarettes    Last attempt to quit: 03/26/1995    Years since quitting: 22.6  . Smokeless tobacco: Never Used  Substance Use Topics  . Alcohol use: No  . Drug use: No     Allergies   Sulfa antibiotics and Zofran [ondansetron hcl]   Review of Systems Review of Systems  Constitutional: Negative.   HENT: Positive for congestion, postnasal  drip and rhinorrhea.   Respiratory: Positive for cough.   Cardiovascular: Negative.      Physical Exam Triage Vital Signs ED Triage Vitals [11/30/17 1602]  Enc Vitals Group     BP (!) 157/73     Pulse Rate 85     Resp 16     Temp 97.9 F (36.6 C)     Temp Source Oral     SpO2 100 %     Weight      Height      Head Circumference      Peak Flow      Pain Score      Pain Loc      Pain Edu?      Excl. in [place]?    No data found.  Updated Vital Signs BP (!) 157/73 (BP Location: Right Arm)   Pulse 85   Temp 97.9 F (36.6 C) (Oral)   Resp 16   SpO2 100%   Visual Acuity Right Eye Distance:   Left Eye Distance:   Bilateral Distance:    Right Eye Near:   Left Eye Near:    Bilateral Near:     Physical Exam  Constitutional: She is oriented to person, place, and time. She appears well-developed and well-nourished.  HENT:  Head: Normocephalic.  Right Ear: External ear normal.  Left Ear: External ear normal.  Mouth/Throat: Oropharynx is clear and moist.  Cardiovascular: Normal rate and regular rhythm.  Pulmonary/Chest: Effort normal. She has wheezes.  Neurological: She is alert and oriented to person, place, and time.     UC Treatments / Results  Labs (all labs ordered are listed, but only abnormal results are displayed) Labs Reviewed - No data to display  EKG None  Radiology No results found.  Procedures Procedures (including critical care time)  Medications Ordered in UC Medications - No data to display  Initial Impression / Assessment and Plan / UC Course  I have reviewed the triage vital signs and the nursing notes.  Pertinent  labs & imaging results that were available during my care of the patient were reviewed by me and considered in my medical decision making (see chart for details).     Productive cough and rhonchi on exam suggests bronchitis.  Will treat with doxycycline taper dose of prednisone.  Continue Singulair as before Final Clinical  Impressions(s) / UC Diagnoses   Final diagnoses:  None   Discharge Instructions   None    ED Prescriptions    None     Controlled Substance Prescriptions [place] Controlled Substance Registry consulted? No   [name], MD 11/30/17 [phone]

## 2017-11-30 NOTE — ED Triage Notes (Signed)
Pt c/o coughing, congestion, states shes been using otc meds without relief.

## 2017-12-01 ENCOUNTER — Ambulatory Visit (INDEPENDENT_AMBULATORY_CARE_PROVIDER_SITE_OTHER): Payer: BLUE CROSS/BLUE SHIELD | Admitting: *Deleted

## 2017-12-01 DIAGNOSIS — I639 Cerebral infarction, unspecified: Secondary | ICD-10-CM | POA: Diagnosis not present

## 2017-12-01 NOTE — Progress Notes (Signed)
Carelink Summary Report / Loop Recorder 

## 2017-12-10 ENCOUNTER — Ambulatory Visit: Payer: BLUE CROSS/BLUE SHIELD | Admitting: Family Medicine

## 2017-12-15 ENCOUNTER — Ambulatory Visit: Payer: BLUE CROSS/BLUE SHIELD | Admitting: Family Medicine

## 2017-12-16 NOTE — Progress Notes (Signed)
Subjective:    Patient ID: Elaine Jackson, female    DOB: 04/27/1956, 62 y.o.   MRN: [id number]  12/22/2017  Hyperlipidemia; Hypertension; Hypothyroidism; Diabetes; Coronary Artery Disease; and Atrial Fibrillation    HPI This 62 y.o. female presents for six month follow-up of chronic medical conditions including diabetes, hypertension, hypercholesterolemia, CAD, atrial fibrillation, history of CVA.  Seeing caridology, neurology, endocrinology.    Tick bite: evaluated by Dr. Mitchel Honour; had a bull's eye on back.  Scheduled appointment with dermatology; removed tick of of leg on leg.  Large bulls eye; Dr. Mitchel Honour very concerned.  Placed on Doxycycline.    Acute bronchitis: evaluated by Urgent Care;   was really sick with acute illness.  Feeling much better.  Atrial fibrillation:  S/p ablation; discharged; no overnight stay.  No symptoms after ablation.   May go through atrial fibillation for three months after ablation; for two days, felt throat irritation but did not.  Cannot tell that had anything done.    Not checking BP at home. Sugars running 136; ate peanut butter last night before bedtime.   Not exercising.   LEFT knee issues; stepped out of car three months ago; horrible pain with stepping out. Had gone to grocery store.  Has fatigue at behind knees; duration all day.  Unable to bear weight.  Went to [facility]; saw PA; s/p injection; then knee quit hurting but right below knee started hurting; follow up with Noemi Chapel; sent for MRI; has bone on bone OA; meniscus tear; stress fracture behind knee; baker's cyst.  Wore brace for six weeks.  Much better.    Has appointment with Dr. Benson Norway for colonoscopy; will go December 31, 2017.   Neurology cleared patient; no follow-up.   Leg heaviness B posterior knees; varicose veins. Took Zantac without improvement.  Took Protonix without improvement.  BP Readings from Last 3 Encounters:  12/22/17 140/72  11/30/17 (!) 157/73  11/18/17  130/80   Wt Readings from Last 3 Encounters:  12/22/17 201 lb (91.2 kg)  11/18/17 207 lb (93.9 kg)  10/27/17 204 lb (92.5 kg)   Immunization History  Administered Date(s) Administered  . Hepatitis A 08/13/2010, 03/20/2011  . Hepatitis B 06/24/2010, 08/13/2010, 12/03/2010, 03/20/2011  . Influenza Split 03/20/2011, 03/02/2012  . Influenza,inj,Quad PF,6+ Mos 04/28/2013, 03/14/2014, 05/01/2016, 03/18/2017  . Influenza-Unspecified 04/19/2015, 03/18/2017  . Pneumococcal Polysaccharide-23 06/06/2014  . Pneumococcal-Unspecified 07/18/2008  . Td 11/22/2001  . Tdap 09/02/2011  . Zoster 06/16/2015    Review of Systems  Constitutional: Negative for activity change, appetite change, chills, diaphoresis, fatigue, fever and unexpected weight change.  HENT: Negative for congestion, dental problem, drooling, ear discharge, ear pain, facial swelling, hearing loss, mouth sores, nosebleeds, postnasal drip, rhinorrhea, sinus pressure, sneezing, sore throat, tinnitus, trouble swallowing and voice change.   Eyes: Negative for photophobia, pain, discharge, redness, itching and visual disturbance.  Respiratory: Negative for apnea, cough, choking, chest tightness, shortness of breath, wheezing and stridor.   Cardiovascular: Negative for chest pain, palpitations and leg swelling.  Gastrointestinal: Negative for abdominal distention, abdominal pain, anal bleeding, blood in stool, constipation, diarrhea, nausea, rectal pain and vomiting.  Endocrine: Negative for cold intolerance, heat intolerance, polydipsia, polyphagia and polyuria.  Genitourinary: Negative for decreased urine volume, difficulty urinating, dyspareunia, dysuria, enuresis, flank pain, frequency, genital sores, hematuria, menstrual problem, pelvic pain, urgency, vaginal bleeding, vaginal discharge and vaginal pain.  Musculoskeletal: Positive for arthralgias, gait problem and joint swelling. Negative for back pain, myalgias, neck pain and neck  stiffness.  Skin: Negative for color change, pallor, rash and wound.  Allergic/Immunologic: Negative for environmental allergies, food allergies and immunocompromised state.  Neurological: Negative for dizziness, tremors, seizures, syncope, facial asymmetry, speech difficulty, weakness, light-headedness, numbness and headaches.  Hematological: Negative for adenopathy. Does not bruise/bleed easily.  Psychiatric/Behavioral: Negative for agitation, behavioral problems, confusion, decreased concentration, dysphoric mood, hallucinations, self-injury, sleep disturbance and suicidal ideas. The patient is not nervous/anxious and is not hyperactive.     Past Medical History:  Diagnosis Date  . Allergic rhinitis, cause unspecified   . Allergy   . CAD (coronary artery disease)    LHC 12/20/2014 showed small to moderate apical infarction due to distal LAD stenosis with reestablished TIMI2 flow. No PCI. Recommended plavix for 1 yr and ASA for life.   . Chicken pox   . Chronic bronchitis ([place])   . Family history of breast cancer in first degree relative    sister age 50  . Heart murmur   . Hyperlipidemia   . Hypertension   . Hypothyroidism   . Malignant neoplasm skin of face 02/16/2007   Basal Cell Carcinoma  [place].  . Measles   . Migraine <1982   "went away when I got pregnant" (09/25/2017)  . Myocardial infarction ([place]) 01/2015  . Obesity, unspecified   . Other chronic nonalcoholic liver disease   . Pre-diabetes   . Sleep related leg cramps   . Stroke (cerebrum) ([place])    due to large vessel disease; denies residual on 09/25/2017  . Thyroid nodule    10-2016   . Unspecified vitamin D deficiency   . Vision abnormalities    Past Surgical History:  Procedure Laterality Date  . ATRIAL FIBRILLATION ABLATION  09/25/2017  . ATRIAL FIBRILLATION ABLATION N/A 09/25/2017   Procedure: ATRIAL FIBRILLATION ABLATION;  Surgeon: Thompson Grayer, MD;  Location: [facility];  Service: Cardiovascular;   Laterality: N/A;  . CARDIAC CATHETERIZATION N/A 12/20/2014   Procedure: Left Heart Cath and Coronary Angiography;  Surgeon: Sanda Klein, MD;  Location: [facility];  Service: Cardiovascular;  Laterality: N/A;  . COLONOSCOPY  06/25/2007   [place]. Normal. Repeat in ten years.  . CYSTOSTOMY W/ BLADDER DILATION  1960s   "bladder stem stretched"  . LAPAROSCOPIC CHOLECYSTECTOMY    . LAPAROSCOPIC GASTRIC BANDING  07/03/10   Dr. Sherrin Daisy; [facility]  . LOOP RECORDER INSERTION N/A 08/23/2016   Procedure: Loop Recorder Insertion;  Surgeon: Deboraha Sprang, MD;  Location: [facility];  Service: Cardiovascular;  Laterality: N/A;  . SQUAMOUS CELL CARCINOMA EXCISION Left    neck  . THYROIDECTOMY N/A 12/17/2016   Procedure: TOTAL THYROIDECTOMY;  Surgeon: Excell Seltzer, MD;  Location: [facility];  Service: General;  Laterality: N/A;  . TONSILLECTOMY     Allergies  Allergen Reactions  . Sulfa Antibiotics Nausea And Vomiting  . Zofran [Ondansetron Hcl] Other (See Comments)    Oral numbness with IV dosing.    Current Outpatient Medications on File Prior to Visit  Medication Sig Dispense Refill  . atorvastatin (LIPITOR) 80 MG tablet TAKE 1 TABLET (80 MG TOTAL) BY MOUTH DAILY AT 6 PM. (Patient taking differently: Take 80 mg by mouth daily. ) 90 tablet 3  . B COMPLEX-C-FOLIC ACID PO Take 1 tablet by mouth daily.    . cholecalciferol (VITAMIN D) 1000 units tablet Take 1 tablet (1,000 Units total) by mouth daily. (Patient taking differently: Take 2,000 Units by mouth daily. )    . diltiazem (CARDIZEM)  30 MG tablet Take 1 tablet every 4 hours AS NEEDED for rapid afib heart rate >100 45 tablet 2  . ELIQUIS 5 MG TABS tablet TAKE 1 TABLET BY MOUTH TWICE A DAY 180 tablet 2  . fluticasone (FLONASE) 50 MCG/ACT nasal spray Place 2 sprays into both nostrils daily as needed for allergies. 48 g 3  . furosemide (LASIX) 20 MG tablet Take 1 tablet (20 mg total) by mouth daily. (Patient taking differently: Take  20 mg by mouth daily as needed for edema. ) 90 tablet 0  . glucose blood test strip Use as instructed 100 each 3  . levothyroxine (SYNTHROID, LEVOTHROID) 100 MCG tablet TAKE 1 TABLET BY MOUTH EVERY DAY BEFORE BREAKFAST (Patient taking differently: Take 100 mcg by mouth daily before breakfast. ) 90 tablet 3  . montelukast (SINGULAIR) 10 MG tablet TAKE 1 TABLET BY MOUTH EVERYDAY AT BEDTIME (Patient taking differently: Take 10 mg by mouth daily. ) 90 tablet 3  . potassium chloride SA (KLOR-CON M20) 20 MEQ tablet Take 1 tablet (20 mEq total) by mouth daily. (Patient taking differently: Take 20 mEq by mouth daily as needed (swelling). Take with lasix) 90 tablet 0   No current facility-administered medications on file prior to visit.    Social History   Socioeconomic History  . Marital status: Married    Spouse name: Not on file  . Number of children: 1  . Years of education: Not on file  . Highest education level: Not on file  Occupational History  . Occupation: HAIR STYLIST    Employer: [employer]  . Financial resource strain: Not on file  . Food insecurity:    Worry: Not on file    Inability: Not on file  . Transportation needs:    Medical: Not on file    Non-medical: Not on file  Tobacco Use  . Smoking status: Former Smoker    Packs/day: 1.00    Years: 25.00    Pack years: 25.00    Types: Cigarettes    Last attempt to quit: 03/26/1995    Years since quitting: 22.7  . Smokeless tobacco: Never Used  Substance and Sexual Activity  . Alcohol use: No  . Drug use: No  . Sexual activity: Not Currently    Birth control/protection: Post-menopausal  Lifestyle  . Physical activity:    Days per week: Not on file    Minutes per session: Not on file  . Stress: Not on file  Relationships  . Social connections:    Talks on phone: Not on file    Gets together: Not on file    Attends religious service: Not on file    Active member of club or organization: Not on file     Attends meetings of clubs or organizations: Not on file    Relationship status: Not on file  . Intimate partner violence:    Fear of current or ex partner: Not on file    Emotionally abused: Not on file    Physically abused: Not on file    Forced sexual activity: Not on file  Other Topics Concern  . Not on file  Social History Narrative   Marital status:  Married; x 27 years. Second marriage. 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  Tobacco:  None      Alcohol: rare/minimal.      Drugs:  None       Exercise:        Seatbelt:  100%.       Sunscreen: SPF 15.      Guns:  Maybe?   Family History  Problem Relation Age of Onset  . Diabetes Mother   . Heart disease Mother        defibrillator; carotid artery stenosis.  . Hypertension Mother   . Hyperlipidemia Mother   . Dementia Mother   . Congestive Heart Failure Mother   . Heart disease Father   . Kidney failure Father        ESRD/peritoneal dialysis  . Diabetes Father   . Hypertension Father   . Hyperlipidemia Father   . Stroke Father   . Kidney disease Father   . Congestive Heart Failure Father   . Diabetes Sister   . Hypertension Sister   . Kidney disease Sister        renal failure  . Diabetes Brother   . Hypertension Brother   . Hyperlipidemia Brother   . Arthritis Brother   . Cervical cancer Sister   . Hypertension Sister   . Liver cancer Sister 14  . Cancer Sister        Liver cancer  . Stroke Maternal Grandmother   . Heart disease Maternal Grandfather   . Stroke Paternal Grandmother   . Heart disease Paternal Grandmother   . Heart disease Paternal Grandfather   . Stroke Paternal Grandfather   . Breast cancer Sister 64  . Hepatitis C Sister 32  . Thyroid disease Neg Hx        Objective:    BP 140/72   Pulse 85   Temp 97.9 F (36.6 C)  (Oral)   Resp 18   Ht 5' 2.21" (1.58 m)   Wt 201 lb (91.2 kg)   SpO2 98%   BMI 36.52 kg/m  Physical Exam  Constitutional: She is oriented to person, place, and time. She appears well-developed and well-nourished. No distress.  HENT:  Head: Normocephalic and atraumatic.  Right Ear: External ear normal.  Left Ear: External ear normal.  Nose: Nose normal.  Mouth/Throat: Oropharynx is clear and moist.  Eyes: Pupils are equal, round, and reactive to light. Conjunctivae and EOM are normal.  Neck: Normal range of motion and full passive range of motion without pain. Neck supple. No JVD present. Carotid bruit is not present. No thyromegaly present.  Cardiovascular: Normal rate, regular rhythm, normal heart sounds and intact distal pulses. Exam reveals no gallop and no friction rub.  No murmur heard. Superficial vein abnormality bilateral thighs.  Nontender to palpation without warmth.  No cords.  Homans negative  Pulmonary/Chest: Effort normal and breath sounds normal. She has no wheezes. She has no rales.  Abdominal: Soft. Bowel sounds are normal. She exhibits no distension and no mass. There is no tenderness. There is no rebound and no guarding.  Musculoskeletal:       Right shoulder: Normal.       Left shoulder: Normal.       Cervical back: Normal.  Lymphadenopathy:    She has no cervical adenopathy.  Neurological: She is alert and oriented to person, place, and time. She has normal reflexes. No cranial nerve deficit. She exhibits normal muscle tone. Coordination normal.  Skin: Skin is warm and dry. No rash noted. She is not diaphoretic. No erythema. No  pallor.  Psychiatric: She has a normal mood and affect. Her behavior is normal. Judgment and thought content normal.  Nursing note and vitals reviewed.  No results found. Depression screen [facility] 2/9 12/22/2017 11/18/2017 05/16/2017 02/11/2017 11/30/2016  Decreased Interest 0 0 0 - 0  Down, Depressed, Hopeless 0 0 0 0 0  PHQ - 2 Score 0 0 0 0 0    Fall Risk  12/22/2017 11/18/2017 05/16/2017 02/11/2017 11/30/2016  Falls in the past year? No No No No No  Risk for fall due to : - - - - History of fall(s)        Assessment & Plan:   1. Diabetes mellitus with complication ([place])   2. Coronary artery disease involving native coronary artery of native heart without angina pectoris   3. Essential hypertension, benign   4. Paroxysmal atrial fibrillation ([facility])   5. Postoperative hypothyroidism   6. Dyslipidemia   7. Vitamin D deficiency   8. History of cerebellar stroke   9. Psychophysiological insomnia   10. Leg heaviness   11. Colon cancer screening    Diabetes mellitus, hypertension, hypothyroidism, dyslipidemia: Well-controlled on current regimens.  Obtain labs for chronic disease management.  No changes to medications at this time.  Atrial fibrillation: Controlled.  Status post ablation in April 2019.  Tolerated procedure well.  Followed closely by cardiology.  History of cerebellar stroke: Asymptomatic at this time.  Medically cleared from neurology.  Continue current medications.  Postoperative hypothyroidism: Controlled.  Medically cleared for endocrinology and general surgery.  Obtain labs.  No changes in medications at this time.  Insomnia: Uncontrolled.  No evidence of underlying anxiety or depression despite multiple medical issues in the past 2 to 3 years.  Patient desires to try melatonin.  Vitamin D deficiency: Uncontrolled.  Repeat labs today.  Continue vitamin D supplementation.  Bilateral leg heaviness: New onset.  Patient with superficial vascular abnormalities.  Also suffering with acute knee pain.  Differential diagnosis includes osteoarthritis or knee or other pathology of knee versus claudication.  Patient has upcoming appointment with cardiology.  Recommend discussing leg heaviness with cardiology.  Warrants arterial studies of legs.  Colon cancer screening: Scheduled for colonoscopy in the upcoming month.   Recommend discussing with cardiology when patient can undergo colonoscopy for colon cancer screening.   Orders Placed This Encounter  Procedures  . CBC with Differential/Platelet  . Comprehensive metabolic panel    Order Specific Question:   Has the patient fasted?    Answer:   No  . Hemoglobin A1c  . Lipid panel    Order Specific Question:   Has the patient fasted?    Answer:   No  . TSH  . T4, free  . Urinalysis, dipstick only  . Microalbumin / creatinine urine ratio  . VITAMIN D 25 Hydroxy (Vit-D Deficiency, Fractures)  . POCT glucose (manual entry)   No orders of the defined types were placed in this encounter.   Return in about 6 months (around 06/24/2018) for complete physical examiniation.   Noralyn Karim Elayne Guerin, M.D. Primary Care at [facility] previously [facility] [address] [place], [place]  [address] [phone] phone [phone] fax

## 2017-12-17 ENCOUNTER — Other Ambulatory Visit: Payer: Self-pay | Admitting: Physician Assistant

## 2017-12-17 DIAGNOSIS — C439 Malignant melanoma of skin, unspecified: Secondary | ICD-10-CM

## 2017-12-17 HISTORY — DX: Malignant melanoma of skin, unspecified: C43.9

## 2017-12-22 ENCOUNTER — Encounter: Payer: Self-pay | Admitting: Family Medicine

## 2017-12-22 ENCOUNTER — Other Ambulatory Visit: Payer: Self-pay

## 2017-12-22 ENCOUNTER — Ambulatory Visit (INDEPENDENT_AMBULATORY_CARE_PROVIDER_SITE_OTHER): Payer: BLUE CROSS/BLUE SHIELD | Admitting: Family Medicine

## 2017-12-22 VITALS — BP 140/72 | HR 85 | Temp 97.9°F | Resp 18 | Ht 62.21 in | Wt 201.0 lb

## 2017-12-22 DIAGNOSIS — I480 Paroxysmal atrial fibrillation: Secondary | ICD-10-CM | POA: Diagnosis not present

## 2017-12-22 DIAGNOSIS — F5104 Psychophysiologic insomnia: Secondary | ICD-10-CM | POA: Diagnosis not present

## 2017-12-22 DIAGNOSIS — E559 Vitamin D deficiency, unspecified: Secondary | ICD-10-CM | POA: Diagnosis not present

## 2017-12-22 DIAGNOSIS — E890 Postprocedural hypothyroidism: Secondary | ICD-10-CM

## 2017-12-22 DIAGNOSIS — I10 Essential (primary) hypertension: Secondary | ICD-10-CM | POA: Diagnosis not present

## 2017-12-22 DIAGNOSIS — E118 Type 2 diabetes mellitus with unspecified complications: Secondary | ICD-10-CM

## 2017-12-22 DIAGNOSIS — Z1211 Encounter for screening for malignant neoplasm of colon: Secondary | ICD-10-CM

## 2017-12-22 DIAGNOSIS — Z8673 Personal history of transient ischemic attack (TIA), and cerebral infarction without residual deficits: Secondary | ICD-10-CM

## 2017-12-22 DIAGNOSIS — E785 Hyperlipidemia, unspecified: Secondary | ICD-10-CM

## 2017-12-22 DIAGNOSIS — R29898 Other symptoms and signs involving the musculoskeletal system: Secondary | ICD-10-CM | POA: Diagnosis not present

## 2017-12-22 DIAGNOSIS — I2510 Atherosclerotic heart disease of native coronary artery without angina pectoris: Secondary | ICD-10-CM | POA: Diagnosis not present

## 2017-12-22 LAB — GLUCOSE, POCT (MANUAL RESULT ENTRY): POC Glucose: 135 mg/dl — AB (ref 70–99)

## 2017-12-22 NOTE — Patient Instructions (Addendum)
Ask cardiologist if OK to have colonoscopy in July. Discuss leg heaviness and leg veins with cardiologist. Recommend Melatonin at bedtime for insomnia.   IF you received an x-ray today, you will receive an invoice from [facility]. Please contact [facility] at [phone] with questions or concerns regarding your invoice.   IF you received labwork today, you will receive an invoice from [facility]. Please contact [facility] at [phone] with questions or concerns regarding your invoice.   Our billing staff will not be able to assist you with questions regarding bills from these companies.  You will be contacted with the lab results as soon as they are available. The fastest way to get your results is to activate your My Chart account. Instructions are located on the last page of this paperwork. If you have not heard from [place] regarding the results in 2 weeks, please contact this office.    Insomnia Insomnia is a sleep disorder that makes it difficult to fall asleep or to stay asleep. Insomnia can cause tiredness (fatigue), low energy, difficulty concentrating, mood swings, and poor performance at work or school. There are three different ways to classify insomnia:  Difficulty falling asleep.  Difficulty staying asleep.  Waking up too early in the morning.  Any type of insomnia can be long-term (chronic) or short-term (acute). Both are common. Short-term insomnia usually lasts for three months or less. Chronic insomnia occurs at least three times a week for longer than three months. What are the causes? Insomnia may be caused by another condition, situation, or substance, such as:  Anxiety.  Certain medicines.  Gastroesophageal reflux disease (GERD) or other gastrointestinal conditions.  Asthma or other breathing conditions.  Restless legs syndrome, sleep apnea, or other sleep disorders.  Chronic pain.  Menopause. This may include hot  flashes.  Stroke.  Abuse of alcohol, tobacco, or illegal drugs.  Depression.  Caffeine.  Neurological disorders, such as Alzheimer disease.  An overactive thyroid (hyperthyroidism).  The cause of insomnia may not be known. What increases the risk? Risk factors for insomnia include:  Gender. Women are more commonly affected than men.  Age. Insomnia is more common as you get older.  Stress. This may involve your professional or personal life.  Income. Insomnia is more common in people with lower income.  Lack of exercise.  Irregular work schedule or night shifts.  Traveling between different time zones.  What are the signs or symptoms? If you have insomnia, trouble falling asleep or trouble staying asleep is the main symptom. This may lead to other symptoms, such as:  Feeling fatigued.  Feeling nervous about going to sleep.  Not feeling rested in the morning.  Having trouble concentrating.  Feeling irritable, anxious, or depressed.  How is this treated? Treatment for insomnia depends on the cause. If your insomnia is caused by an underlying condition, treatment will focus on addressing the condition. Treatment may also include:  Medicines to help you sleep.  Counseling or therapy.  Lifestyle adjustments.  Follow these instructions at home:  Take medicines only as directed by your health care provider.  Keep regular sleeping and waking hours. Avoid naps.  Keep a sleep diary to help you and your health care provider figure out what could be causing your insomnia. Include: ? When you sleep. ? When you wake up during the night. ? How well you sleep. ? How rested you feel the next day. ? Any side effects of medicines you are taking. ? What you eat and  drink.  Make your bedroom a comfortable place where it is easy to fall asleep: ? Put up shades or special blackout curtains to block light from outside. ? Use a white noise machine to block noise. ? Keep  the temperature cool.  Exercise regularly as directed by your health care provider. Avoid exercising right before bedtime.  Use relaxation techniques to manage stress. Ask your health care provider to suggest some techniques that may work well for you. These may include: ? Breathing exercises. ? Routines to release muscle tension. ? Visualizing peaceful scenes.  Cut back on alcohol, caffeinated beverages, and cigarettes, especially close to bedtime. These can disrupt your sleep.  Do not overeat or eat spicy foods right before bedtime. This can lead to digestive discomfort that can make it hard for you to sleep.  Limit screen use before bedtime. This includes: ? Watching TV. ? Using your smartphone, tablet, and computer.  Stick to a routine. This can help you fall asleep faster. Try to do a quiet activity, brush your teeth, and go to bed at the same time each night.  Get out of bed if you are still awake after 15 minutes of trying to sleep. Keep the lights down, but try reading or doing a quiet activity. When you feel sleepy, go back to bed.  Make sure that you drive carefully. Avoid driving if you feel very sleepy.  Keep all follow-up appointments as directed by your health care provider. This is important. Contact a health care provider if:  You are tired throughout the day or have trouble in your daily routine due to sleepiness.  You continue to have sleep problems or your sleep problems get worse. Get help right away if:  You have serious thoughts about hurting yourself or someone else. This information is not intended to replace advice given to you by your health care provider. Make sure you discuss any questions you have with your health care provider. Document Released: 06/07/2000 Document Revised: 11/10/2015 Document Reviewed: 03/11/2014 Elsevier Interactive Patient Education  [employer].

## 2017-12-23 LAB — URINALYSIS, DIPSTICK ONLY
Bilirubin, UA: NEGATIVE
GLUCOSE, UA: NEGATIVE
Ketones, UA: NEGATIVE
Nitrite, UA: NEGATIVE
PROTEIN UA: NEGATIVE
RBC, UA: NEGATIVE
Specific Gravity, UA: >=1.03 — AB (ref 1.005–1.030)
Urobilinogen, Ur: 0.2 mg/dL (ref 0.2–1.0)
pH, UA: 5 (ref 5.0–7.5)

## 2017-12-23 LAB — HEMOGLOBIN A1C
Est. average glucose Bld gHb Est-mCnc: 123 mg/dL
Hgb A1c MFr Bld: 5.9 % — ABNORMAL HIGH (ref 4.8–5.6)

## 2017-12-23 LAB — LIPID PANEL
CHOL/HDL RATIO: 2.5 ratio (ref 0.0–4.4)
Cholesterol, Total: 147 mg/dL (ref 100–199)
HDL: 60 mg/dL (ref >39)
LDL CALC: 71 mg/dL (ref 0–99)
Triglycerides: 80 mg/dL (ref 0–149)
VLDL Cholesterol Cal: 16 mg/dL (ref 5–40)

## 2017-12-23 LAB — COMPREHENSIVE METABOLIC PANEL
ALBUMIN: 4.5 g/dL (ref 3.6–4.8)
ALK PHOS: 68 IU/L (ref 39–117)
ALT: 18 IU/L (ref 0–32)
AST: 18 IU/L (ref 0–40)
Albumin/Globulin Ratio: 1.8 (ref 1.2–2.2)
BILIRUBIN TOTAL: 0.4 mg/dL (ref 0.0–1.2)
BUN / CREAT RATIO: 20 (ref 12–28)
BUN: 20 mg/dL (ref 8–27)
CHLORIDE: 103 mmol/L (ref 96–106)
CO2: 24 mmol/L (ref 20–29)
Calcium: 9.4 mg/dL (ref 8.7–10.3)
Creatinine, Ser: 1 mg/dL (ref 0.57–1.00)
GFR calc non Af Amer: 61 mL/min/{1.73_m2} (ref >59)
GFR, EST AFRICAN AMERICAN: 70 mL/min/{1.73_m2} (ref >59)
GLUCOSE: 130 mg/dL — AB (ref 65–99)
Globulin, Total: 2.5 g/dL (ref 1.5–4.5)
POTASSIUM: 4.7 mmol/L (ref 3.5–5.2)
Sodium: 142 mmol/L (ref 134–144)
TOTAL PROTEIN: 7 g/dL (ref 6.0–8.5)

## 2017-12-23 LAB — CBC WITH DIFFERENTIAL/PLATELET
BASOS ABS: 0 10*3/uL (ref 0.0–0.2)
Basos: 1 %
EOS (ABSOLUTE): 0.3 10*3/uL (ref 0.0–0.4)
EOS: 5 %
Hematocrit: 37.6 % (ref 34.0–46.6)
Hemoglobin: 12.4 g/dL (ref 11.1–15.9)
IMMATURE GRANS (ABS): 0 10*3/uL (ref 0.0–0.1)
Immature Granulocytes: 0 %
LYMPHS ABS: 1.6 10*3/uL (ref 0.7–3.1)
Lymphs: 26 %
MCH: 32 pg (ref 26.6–33.0)
MCHC: 33 g/dL (ref 31.5–35.7)
MCV: 97 fL (ref 79–97)
Monocytes Absolute: 0.6 10*3/uL (ref 0.1–0.9)
Monocytes: 10 %
NEUTROS PCT: 58 %
Neutrophils Absolute: 3.5 10*3/uL (ref 1.4–7.0)
PLATELETS: 292 10*3/uL (ref 150–450)
RBC: 3.87 x10E6/uL (ref 3.77–5.28)
RDW: 13.6 % (ref 12.3–15.4)
WBC: 5.9 10*3/uL (ref 3.4–10.8)

## 2017-12-23 LAB — VITAMIN D 25 HYDROXY (VIT D DEFICIENCY, FRACTURES): Vit D, 25-Hydroxy: 35.1 ng/mL (ref 30.0–100.0)

## 2017-12-23 LAB — T4, FREE: FREE T4: 1.31 ng/dL (ref 0.82–1.77)

## 2017-12-23 LAB — TSH: TSH: 6.7 u[IU]/mL — AB (ref 0.450–4.500)

## 2017-12-23 LAB — MICROALBUMIN / CREATININE URINE RATIO
Creatinine, Urine: 227.3 mg/dL
MICROALBUM., U, RANDOM: 14.5 ug/mL
Microalb/Creat Ratio: 6.4 mg/g creat (ref 0.0–30.0)

## 2017-12-25 ENCOUNTER — Other Ambulatory Visit: Payer: Self-pay | Admitting: Family Medicine

## 2017-12-26 NOTE — Telephone Encounter (Signed)
Interface request for refill:  Azelastine  LOV:  12/22/17 with Reginia Forts  Last refill:  8/15/ 18  Pharmacy:  [facility] [address]

## 2017-12-29 ENCOUNTER — Ambulatory Visit: Payer: BLUE CROSS/BLUE SHIELD | Admitting: Internal Medicine

## 2017-12-29 ENCOUNTER — Encounter: Payer: Self-pay | Admitting: Internal Medicine

## 2017-12-29 VITALS — BP 136/70 | HR 70 | Ht 62.0 in | Wt 205.0 lb

## 2017-12-29 DIAGNOSIS — I2510 Atherosclerotic heart disease of native coronary artery without angina pectoris: Secondary | ICD-10-CM

## 2017-12-29 DIAGNOSIS — I10 Essential (primary) hypertension: Secondary | ICD-10-CM

## 2017-12-29 DIAGNOSIS — I872 Venous insufficiency (chronic) (peripheral): Secondary | ICD-10-CM

## 2017-12-29 DIAGNOSIS — I639 Cerebral infarction, unspecified: Secondary | ICD-10-CM | POA: Diagnosis not present

## 2017-12-29 DIAGNOSIS — I480 Paroxysmal atrial fibrillation: Secondary | ICD-10-CM | POA: Diagnosis not present

## 2017-12-29 LAB — CUP PACEART INCLINIC DEVICE CHECK
Date Time Interrogation Session: 20190708130613
Implantable Pulse Generator Implant Date: 20180302

## 2017-12-29 NOTE — Progress Notes (Signed)
PCP: [name], MD Primary Cardiologist: Dr Fara Boros is a 62 y.o. female who presents today for routine electrophysiology followup.  Since his recent afib ablation, the patient reports doing very well.  she denies procedure related complications and is pleased with the results of the procedure.  Today, she denies symptoms of palpitations, chest pain, shortness of breath,  lower extremity edema, dizziness, presyncope, or syncope.  The patient is otherwise without complaint today.   Past Medical History:  Diagnosis Date  . Allergic rhinitis, cause unspecified   . Allergy   . CAD (coronary artery disease)    LHC 12/20/2014 showed small to moderate apical infarction due to distal LAD stenosis with reestablished TIMI2 flow. No PCI. Recommended plavix for 1 yr and ASA for life.   . Chicken pox   . Chronic bronchitis ([place])   . Family history of breast cancer in first degree relative    sister age 22  . Heart murmur   . Hyperlipidemia   . Hypertension   . Hypothyroidism   . Malignant neoplasm skin of face 02/16/2007   Basal Cell Carcinoma  [place].  . Measles   . Migraine <1982   "went away when I got pregnant" (09/25/2017)  . Myocardial infarction ([place]) 01/2015  . Obesity, unspecified   . Other chronic nonalcoholic liver disease   . Pre-diabetes   . Sleep related leg cramps   . 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 Thyroid nodule    10-2016   . Unspecified vitamin D deficiency   . Vision abnormalities    Past Surgical History:  Procedure Laterality Date  . ATRIAL FIBRILLATION ABLATION  09/25/2017  . ATRIAL FIBRILLATION ABLATION N/A 09/25/2017   Procedure: ATRIAL FIBRILLATION ABLATION;  Surgeon: Thompson Grayer, MD;  Location: [place] [facility];  Service: Cardiovascular;  Laterality: N/A;  . CARDIAC CATHETERIZATION N/A 12/20/2014   Procedure: Left Heart Cath and Coronary Angiography;  Surgeon: Sanda Klein, MD;   Location: [place] [facility];  Service: Cardiovascular;  Laterality: N/A;  . COLONOSCOPY  06/25/2007   [place]. Normal. Repeat in ten years.  . CYSTOSTOMY W/ BLADDER DILATION  1960s   "bladder stem stretched"  . LAPAROSCOPIC CHOLECYSTECTOMY    . LAPAROSCOPIC GASTRIC BANDING  07/03/10   Dr. Sherrin Daisy; [facility]  . LOOP RECORDER INSERTION N/A 08/23/2016   Procedure: Loop Recorder Insertion;  Surgeon: Deboraha Sprang, MD;  Location: [place] [facility];  Service: Cardiovascular;  Laterality: N/A;  . SQUAMOUS CELL CARCINOMA EXCISION Left    neck  . THYROIDECTOMY N/A 12/17/2016   Procedure: TOTAL THYROIDECTOMY;  Surgeon: Excell Seltzer, MD;  [facility];  Service: General;  Laterality: N/A;  . TONSILLECTOMY      ROS- all systems are personally reviewed and negatives except as per HPI above  Current Outpatient Medications  Medication Sig Dispense Refill  . atorvastatin (LIPITOR) 80 MG tablet TAKE 1 TABLET (80 MG TOTAL) BY MOUTH DAILY AT 6 PM. (Patient taking differently: Take 80 mg by mouth daily. ) 90 tablet 3  . Azelastine HCl 0.15 % SOLN Place 2 sprays into the nose 2 (two) times daily. 90 mL 4  . B COMPLEX-C-FOLIC ACID PO Take 1 tablet by mouth daily.    . cholecalciferol (VITAMIN D) 1000 units tablet Take 1 tablet (1,000 Units total) by mouth daily. (Patient taking differently: Take 2,000 Units by mouth daily. )    .  diltiazem (CARDIZEM) 30 MG tablet Take 1 tablet every 4 hours AS NEEDED for rapid afib heart rate >100 45 tablet 2  . ELIQUIS 5 MG TABS tablet TAKE 1 TABLET BY MOUTH TWICE A DAY 180 tablet 2  . fluticasone (FLONASE) 50 MCG/ACT nasal spray Place 2 sprays into both nostrils daily as needed for allergies. 48 g 3  . furosemide (LASIX) 20 MG tablet Take 1 tablet (20 mg total) by mouth daily. (Patient taking differently: Take 20 mg by mouth daily as needed for edema. ) 90 tablet 0  . glucose blood test strip Use as instructed 100 each 3  . levothyroxine (SYNTHROID, LEVOTHROID)  100 MCG tablet TAKE 1 TABLET BY MOUTH EVERY DAY BEFORE BREAKFAST (Patient taking differently: Take 100 mcg by mouth daily before breakfast. ) 90 tablet 3  . montelukast (SINGULAIR) 10 MG tablet TAKE 1 TABLET BY MOUTH EVERYDAY AT BEDTIME (Patient taking differently: Take 10 mg by mouth daily. ) 90 tablet 3  . potassium chloride SA (KLOR-CON M20) 20 MEQ tablet Take 1 tablet (20 mEq total) by mouth daily. (Patient taking differently: Take 20 mEq by mouth daily as needed (swelling). Take with lasix) 90 tablet 0   No current facility-administered medications for this visit.     Physical Exam: Vitals:   12/29/17 1004  BP: 136/70  Pulse: 70  Weight: 205 lb (93 kg)  Height: 5\' 2"  (1.575 m)    GEN- The patient is well appearing, alert and oriented x 3 today.   Head- normocephalic, atraumatic Eyes-  Sclera clear, conjunctiva pink Ears- hearing intact Oropharynx- clear Lungs- Clear to ausculation bilaterally, normal work of breathing Heart- Regular rate and rhythm, no murmurs, rubs or gallops, PMI not laterally displaced GI- soft, NT, ND, + BS Extremities- no clubbing, cyanosis, or edema  EKG tracing ordered today is personally reviewed and shows sinus rhythm 70 bpm, nonspecific ST/T changes  Assessment and Plan:  1. Paroxysmal atrial fibrillation and atrial flutter Doing well s/p ablation off AAD therapy chads2vasc score is 6. Continue anticoagulation ILR interrogation today reveals  We discussed ARREST AF results and importance of regular exercise and weight loss today  2. Obesity Body mass index is 37.49 kg/m. Wt Readings from Last 3 Encounters:  12/29/17 205 lb (93 kg)  12/22/17 201 lb (91.2 kg)  11/18/17 207 lb (93.9 kg)   3. Venous insufficiency She has varicosities behind both knees which are bothersome.  I will refer to Dr Early for further assessment of venous abnormalities  4. HL Some of her leg weakness/ discomfort may be due to her statin (high dose) May need to  consider reducing/ stopping her statin pending evaluation by Dr Donnetta Hutching. She does have CAD.  We may need Dr Kingsley Plan input here.  Return to see me in 3 months  Thompson Grayer MD, [facility] 12/29/2017 10:33 AM

## 2017-12-29 NOTE — Patient Instructions (Addendum)
Medication Instructions:  Your physician recommends that you continue on your current medications as directed. Please refer to the Current Medication list given to you today.  Labwork: None ordered.  Testing/Procedures: None ordered.  Follow-Up: Your physician wants you to follow-up in: 3 months with Dr. Rayann Heman.      Any Other Special Instructions Will Be Listed Below (If Applicable).  You have been referred to Dr. Donnetta Hutching for venous insufficiency.  You will be called to make an appointment.  If you need a refill on your cardiac medications before your next appointment, please call your pharmacy.

## 2017-12-30 ENCOUNTER — Encounter: Payer: Self-pay | Admitting: Family Medicine

## 2017-12-30 ENCOUNTER — Other Ambulatory Visit: Payer: Self-pay

## 2017-12-30 DIAGNOSIS — I872 Venous insufficiency (chronic) (peripheral): Secondary | ICD-10-CM

## 2018-01-01 ENCOUNTER — Encounter: Payer: Self-pay | Admitting: Family Medicine

## 2018-01-01 ENCOUNTER — Ambulatory Visit (INDEPENDENT_AMBULATORY_CARE_PROVIDER_SITE_OTHER): Payer: BLUE CROSS/BLUE SHIELD | Admitting: *Deleted

## 2018-01-01 DIAGNOSIS — I639 Cerebral infarction, unspecified: Secondary | ICD-10-CM | POA: Diagnosis not present

## 2018-01-01 NOTE — Progress Notes (Signed)
Carelink Summary Report / Loop Recorder 

## 2018-01-04 ENCOUNTER — Encounter: Payer: Self-pay | Admitting: Family Medicine

## 2018-01-04 MED ORDER — LEVOTHYROXINE SODIUM 125 MCG PO TABS: 125.0000 ug | ORAL_TABLET | Freq: Every day | ORAL | 3 refills | Status: DC

## 2018-01-05 ENCOUNTER — Telehealth: Payer: Self-pay

## 2018-01-05 NOTE — Telephone Encounter (Signed)
   [facility] Pre-operative Risk Assessment    Request for surgical clearance:  1. What type of surgery is being performed? Colonscopy  2. When is this surgery scheduled? 02/03/2018    3. What type of clearance is required (medical clearance vs. Pharmacy clearance to hold med vs. Both)? BOTH   4. Are there any medications that need to be held prior to surgery and how long? Eliquis   5. Practice name and name of physician performing surgery?Dr Collene Mares   [facility]  6. What is your office phone number [phone]    0.   What is your office fax number [phone]  8.   Anesthesia type (None, local, MAC, general) ? Propofol    Shella Maxim Suits 01/05/2018, 3:43 PM  _________________________________________________________________   (provider comments below)

## 2018-01-06 LAB — CUP PACEART REMOTE DEVICE CHECK
Date Time Interrogation Session: 20190608160942
MDC IDC PG IMPLANT DT: 20180302

## 2018-01-06 NOTE — Telephone Encounter (Signed)
Called patient, she states she just saw Dr. Rayann Heman last week and he cleared her for her colonoscopy.  She states he told her she could stop the Eliquis for a few days prior (no note of this in his office note).    Advised I would verify with pre-op APP and call back.   APP recommends clarifying with Dr. Rayann Heman.    Message routed to Dr. Rayann Heman to clarify.   Patient made aware.

## 2018-01-06 NOTE — Telephone Encounter (Signed)
Pt takes Eliquis for afib with CHADS2VASc score of 6 (sex, HTN, CAD, DM, CVA). Recommend only holding Eliquis for 24 hours prior to colonoscopy due to history of afib with stroke.

## 2018-01-06 NOTE — Telephone Encounter (Signed)
   Primary Cardiologist: Dr Marlou Porch - Dr Rayann Heman (EP)  Chart reviewed as part of pre-operative protocol coverage. Because of Oakleigh Hesketh Knieriem's past medical history and time since last visit, he/she will require a follow-up visit in order to better assess preoperative cardiovascular risk.  Dr Rayann Heman is not her general cardiologist, he has followed her for arrhythmia.   Pre-op covering staff: - Please schedule appointment and call patient to inform them. - Please contact requesting surgeon's office via preferred method (i.e, phone, fax) to inform them of need for appointment prior to surgery.  Kerin Ransom, PA-C  01/06/2018, 2:23 PM

## 2018-01-06 NOTE — Telephone Encounter (Signed)
She may hold her Eliquis for 2 days colonoscopy. Restart when ok with her GI MD.

## 2018-01-07 NOTE — Telephone Encounter (Signed)
Spoke with patient and informed her of Dr Tanna Furry instructions with Eliquis. Recommendation faxed to GI MD. Patient voiced understanding.

## 2018-01-08 ENCOUNTER — Other Ambulatory Visit: Payer: Self-pay | Admitting: Family Medicine

## 2018-01-08 NOTE — Telephone Encounter (Signed)
Patient called and asked about the ranitidine refill request, she says she doesn't take this medication anymore.

## 2018-01-09 ENCOUNTER — Other Ambulatory Visit: Payer: Self-pay | Admitting: Family Medicine

## 2018-01-14 ENCOUNTER — Other Ambulatory Visit: Payer: Self-pay | Admitting: Physician Assistant

## 2018-01-19 ENCOUNTER — Other Ambulatory Visit: Payer: Self-pay

## 2018-01-19 ENCOUNTER — Ambulatory Visit (HOSPITAL_COMMUNITY)
Admission: RE | Admit: 2018-01-19 | Discharge: 2018-01-19 | Disposition: A | Discharge status: Home / Self Care | Payer: BLUE CROSS/BLUE SHIELD | Source: Ambulatory Visit | Attending: Vascular Surgery | Admitting: Vascular Surgery

## 2018-01-19 ENCOUNTER — Encounter: Payer: Self-pay | Admitting: Vascular Surgery

## 2018-01-19 ENCOUNTER — Ambulatory Visit (INDEPENDENT_AMBULATORY_CARE_PROVIDER_SITE_OTHER): Payer: BLUE CROSS/BLUE SHIELD | Admitting: Vascular Surgery

## 2018-01-19 VITALS — BP 126/80 | HR 82 | Temp 97.5°F | Resp 18 | Ht 63.0 in | Wt 206.8 lb

## 2018-01-19 DIAGNOSIS — I872 Venous insufficiency (chronic) (peripheral): Secondary | ICD-10-CM | POA: Diagnosis not present

## 2018-01-19 DIAGNOSIS — R609 Edema, unspecified: Secondary | ICD-10-CM | POA: Insufficient documentation

## 2018-01-19 DIAGNOSIS — I83811 Varicose veins of right lower extremities with pain: Secondary | ICD-10-CM | POA: Diagnosis not present

## 2018-01-19 NOTE — Progress Notes (Signed)
Referring Physician: Dr Rayann Heman  Patient name: Elaine Jackson MRN: [id number] DOB: [birth year] Sex: female  REASON FOR CONSULT:   HPI: Elaine Jackson is a 62 y.o. 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  This is been going on for several years.  Usually the pain is relieved by sitting down.  She does not really elevate her legs.  She has not worn compression stockings in the past.  She denies trauma to her lower extremities.  She does not describe claudication rest pain or nonhealing wounds.  She has no prior history of DVT.  She has noticed some varicosities on the lateral aspect of her right leg.  Other medical problems include coronary artery disease, atrial fibrillation hyperlipidemia prior stroke all of which have been stable.  Past Medical History:  Diagnosis Date  . Allergic rhinitis, cause unspecified   . Allergy   . CAD (coronary artery disease)    LHC 12/20/2014 showed small to moderate apical infarction due to distal LAD stenosis with reestablished TIMI2 flow. No PCI. Recommended plavix for 1 yr and ASA for life.   . Chicken pox   . Chronic bronchitis ([place])   . Family history of breast cancer in first degree relative    sister age 65  . Heart murmur   . Hyperlipidemia   . Hypertension   . Hypothyroidism   . Malignant neoplasm skin of face 02/16/2007   Basal Cell Carcinoma  [place].  . Measles   . Migraine <1982   "went away when I got pregnant" (09/25/2017)  . Myocardial infarction ([place]) 01/2015  . Obesity, unspecified   . Other chronic nonalcoholic liver disease   . Pre-diabetes   . Sleep related leg cramps   . 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 Thyroid nodule    10-2016   . Unspecified vitamin D deficiency   . Vision abnormalities    Past Surgical History:  Procedure Laterality Date  . ATRIAL FIBRILLATION ABLATION   09/25/2017  . ATRIAL FIBRILLATION ABLATION N/A 09/25/2017   Procedure: ATRIAL FIBRILLATION ABLATION;  Surgeon: Thompson Grayer, MD;  Location: [place] [facility];  Service: Cardiovascular;  Laterality: N/A;  . CARDIAC CATHETERIZATION N/A 12/20/2014   Procedure: Left Heart Cath and Coronary Angiography;  Surgeon: Sanda Klein, MD;  Location: [place] [facility];  Service: Cardiovascular;  Laterality: N/A;  . COLONOSCOPY  06/25/2007   [place]. Normal. Repeat in ten years.  . CYSTOSTOMY W/ BLADDER DILATION  1960s   "bladder stem stretched"  . LAPAROSCOPIC CHOLECYSTECTOMY    . LAPAROSCOPIC GASTRIC BANDING  07/03/10   Dr. Sherrin Daisy; [facility]  . LOOP RECORDER INSERTION N/A 08/23/2016   Procedure: Loop Recorder Insertion;  Surgeon: Deboraha Sprang, MD;  Location: [place] [facility];  Service: Cardiovascular;  Laterality: N/A;  . SQUAMOUS CELL CARCINOMA EXCISION Left    neck  . THYROIDECTOMY N/A 12/17/2016   Procedure: TOTAL THYROIDECTOMY;  Surgeon: Excell Seltzer, MD;  [facility];  Service: General;  Laterality: N/A;  . TONSILLECTOMY      Family History  Problem Relation Age of Onset  . Diabetes Mother   . Heart disease Mother        defibrillator; carotid artery stenosis.  . Hypertension Mother   . Hyperlipidemia Mother   . Dementia Mother   . Congestive Heart  Failure Mother   . Heart disease Father   . Kidney failure Father        ESRD/peritoneal dialysis  . Diabetes Father   . Hypertension Father   . Hyperlipidemia Father   . Stroke Father   . Kidney disease Father   . Congestive Heart Failure Father   . Diabetes Sister   . Hypertension Sister   . Kidney disease Sister        renal failure  . Diabetes Brother   . Hypertension Brother   . Hyperlipidemia Brother   . Arthritis Brother   . Cervical cancer Sister   . Hypertension Sister   . Liver cancer Sister 36  . Cancer Sister        Liver cancer  . Stroke Maternal Grandmother   . Heart disease Maternal Grandfather   .  Stroke Paternal Grandmother   . Heart disease Paternal Grandmother   . Heart disease Paternal Grandfather   . Stroke Paternal Grandfather   . Breast cancer Sister 74  . Hepatitis C Sister 85  . Thyroid disease Neg Hx     SOCIAL HISTORY: Social History   Socioeconomic History  . Marital status: Married    Spouse name: Not on file  . Number of children: 1  . Years of education: Not on file  . Highest education level: Not on file  Occupational History  . Occupation: HAIR STYLIST    Employer: [employer]  . Financial resource strain: Not on file  . Food insecurity:    Worry: Not on file    Inability: Not on file  . Transportation needs:    Medical: Not on file    Non-medical: Not on file  Tobacco Use  . Smoking status: Former Smoker    Packs/day: 1.00    Years: 25.00    Pack years: 25.00    Types: Cigarettes    Last attempt to quit: 03/26/1995    Years since quitting: 22.8  . Smokeless tobacco: Never Used  Substance and Sexual Activity  . Alcohol use: No  . Drug use: No  . Sexual activity: Not Currently    Birth control/protection: Post-menopausal  Lifestyle  . Physical activity:    Days per week: Not on file    Minutes per session: Not on file  . Stress: Not on file  Relationships  . Social connections:    Talks on phone: Not on file    Gets together: Not on file    Attends religious service: Not on file    Active member of club or organization: Not on file    Attends meetings of clubs or organizations: Not on file    Relationship status: Not on file  . Intimate partner violence:    Fear of current or ex partner: Not on file    Emotionally abused: Not on file    Physically abused: Not on file    Forced sexual activity: Not on file  Other Topics Concern  . Not on file  Social History Narrative   Marital status:  Married; x 27 years. Second marriage. 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      Tobacco:  None      Alcohol: rare/minimal.      Drugs:  None  Exercise:        Seatbelt:  100%.       Sunscreen: SPF 15.      Guns:  Maybe?    Allergies  Allergen Reactions  . Sulfa Antibiotics Nausea And Vomiting  . Zofran [Ondansetron Hcl] Other (See Comments)    Oral numbness with IV dosing.     Current Outpatient Medications  Medication Sig Dispense Refill  . atorvastatin (LIPITOR) 80 MG tablet TAKE 1 TABLET (80 MG TOTAL) BY MOUTH DAILY AT 6 PM. (Patient taking differently: Take 80 mg by mouth daily. ) 90 tablet 3  . Azelastine HCl 0.15 % SOLN Place 2 sprays into the nose 2 (two) times daily. (Patient taking differently: Place 2 sprays into the nose as needed. ) 90 mL 4  . cholecalciferol (VITAMIN D) 1000 units tablet Take 1 tablet (1,000 Units total) by mouth daily. (Patient taking differently: Take 4,000 Units by mouth daily. )    . CONTOUR NEXT TEST test strip AS DIRECTED 100 each 4  . diltiazem (CARDIZEM) 30 MG tablet Take 1 tablet every 4 hours AS NEEDED for rapid afib heart rate >100 45 tablet 2  . ELIQUIS 5 MG TABS tablet TAKE 1 TABLET BY MOUTH TWICE A DAY 180 tablet 2  . fluticasone (FLONASE) 50 MCG/ACT nasal spray Place 2 sprays into both nostrils daily as needed for allergies. 48 g 3  . furosemide (LASIX) 20 MG tablet Take 1 tablet (20 mg total) by mouth daily. (Patient taking differently: Take 20 mg by mouth daily as needed for edema. ) 90 tablet 0  . levothyroxine (SYNTHROID, LEVOTHROID) 125 MCG tablet Take 1 tablet (125 mcg total) by mouth daily before breakfast. BEFORE BREAKFAST WITH A FULL GLASS OF WATER. 90 tablet 3  . montelukast (SINGULAIR) 10 MG tablet TAKE 1 TABLET BY MOUTH EVERYDAY AT BEDTIME (Patient taking differently: Take 10 mg by mouth daily. ) 90 tablet 3  . potassium chloride SA (KLOR-CON M20) 20 MEQ tablet Take 1 tablet (20 mEq total) by  mouth daily. (Patient taking differently: Take 20 mEq by mouth daily as needed (swelling). Take with lasix) 90 tablet 0  . B COMPLEX-C-FOLIC ACID PO Take 1 tablet by mouth daily.     No current facility-administered medications for this visit.     ROS:   General:  No weight loss, Fever, chills  HEENT: No recent headaches, no nasal bleeding, no visual changes, no sore throat  Neurologic: No dizziness, blackouts, seizures. No recent symptoms of stroke or mini- stroke. No recent episodes of slurred speech, or temporary blindness.  Cardiac: No recent episodes of chest pain/pressure, no shortness of breath at rest.  No shortness of breath with exertion. + history of atrial fibrillation or irregular heartbeat  Vascular: No history of rest pain in feet.  No history of claudication.  No history of non-healing ulcer, No history of DVT   Pulmonary: No home oxygen, no productive cough, no hemoptysis,  No asthma or wheezing  Musculoskeletal:  [ ]  Arthritis, [ ]  Low back pain,  [ ]  Joint pain  Hematologic:No history of hypercoagulable state.  No history of easy bleeding.  No history of anemia  Gastrointestinal: No hematochezia or melena,  No gastroesophageal reflux, no trouble swallowing  Urinary: [ ]  chronic Kidney disease, [ ]  on HD - [ ]  MWF or [ ]  TTHS, [ ]  Burning with urination, [ ]  Frequent urination, [ ]  Difficulty urinating;   Skin: No rashes  Psychological: No history of anxiety,  No history of depression   Physical Examination  Vitals:   01/19/18 1419  BP: 126/80  Pulse: 82  Resp: 18  Temp: (!) 97.5 F (36.4 C)  TempSrc: Oral  SpO2: 98%  Weight: 206 lb 12.8 oz (93.8 kg)  Height: 5\' 3"  (1.6 m)    Body mass index is 36.63 kg/m.  General:  Alert and oriented, no acute distress HEENT: Normal Neck: No bruit or JVD Pulmonary: Clear to auscultation bilaterally Cardiac: Regular Rate and Rhythm without murmur Abdomen: Soft, non-tender, non-distended, no mass Skin: No  rash, scattered reticular type varicosities right lateral aspect of the no real significant palpable or visible varicosities otherwise Extremity Pulses:  2+ radial, brachial, femoral, dorsalis pedis pulses bilaterally Musculoskeletal: No deformity or edema  Neurologic: Upper and lower extremity motor 5/5 and symmetric  DATA:  Patient had a venous reflux exam today.  Showed the left greater saphenous vein diameter was 5 to 6 mm.  Right side was about 5 mm as well.  There was no reflux at the saphenofemoral junction or within the main body of the vein.  Deep vein system showed no evidence of reflux as well.  ASSESSMENT: Scattered reticular type varicosities right lateral aspect of the knee no obvious reflux within the superficial or deep system area patient's heaviness and achiness in the legs may be secondary to venous hypertension.  She does not have obvious reflux on the exam today.   PLAN: Patient was given a prescription today for lower extremity compression stockings.  She will see if she gets symptomatic relief from these.  If her varicosities get worse over time we could consider repeating her duplex exam.  Otherwise compression will be the mainstay of therapy for now.  If this is venous hypertension should improve with that as well.  Patient will follow-up with [place] on an as-needed basis.  Ruta Hinds, MD [facility] Office: [phone] Pager: [phone]

## 2018-01-24 ENCOUNTER — Encounter: Payer: Self-pay | Admitting: Family Medicine

## 2018-02-03 ENCOUNTER — Encounter: Payer: Self-pay | Admitting: Family Medicine

## 2018-02-03 ENCOUNTER — Ambulatory Visit (INDEPENDENT_AMBULATORY_CARE_PROVIDER_SITE_OTHER): Payer: BLUE CROSS/BLUE SHIELD | Admitting: *Deleted

## 2018-02-03 DIAGNOSIS — I639 Cerebral infarction, unspecified: Secondary | ICD-10-CM | POA: Diagnosis not present

## 2018-02-04 NOTE — Progress Notes (Signed)
Carelink Summary Report / Loop Recorder 

## 2018-02-12 LAB — CUP PACEART REMOTE DEVICE CHECK
Date Time Interrogation Session: 20190711160855
MDC IDC PG IMPLANT DT: 20180302

## 2018-02-13 ENCOUNTER — Encounter (HOSPITAL_COMMUNITY): Payer: BLUE CROSS/BLUE SHIELD

## 2018-02-13 ENCOUNTER — Encounter: Payer: BLUE CROSS/BLUE SHIELD | Admitting: Vascular Surgery

## 2018-02-15 ENCOUNTER — Other Ambulatory Visit (HOSPITAL_COMMUNITY): Payer: Self-pay | Admitting: Nurse Practitioner

## 2018-02-16 NOTE — Telephone Encounter (Signed)
This is a A-Fib pt. Elaine Palau, NP prescribed this medication. Please address

## 2018-02-18 ENCOUNTER — Encounter: Payer: Self-pay | Admitting: Family Medicine

## 2018-03-09 ENCOUNTER — Ambulatory Visit (INDEPENDENT_AMBULATORY_CARE_PROVIDER_SITE_OTHER): Payer: BLUE CROSS/BLUE SHIELD | Admitting: *Deleted

## 2018-03-09 DIAGNOSIS — I639 Cerebral infarction, unspecified: Secondary | ICD-10-CM

## 2018-03-09 NOTE — Progress Notes (Signed)
Carelink Summary Report / Loop Recorder 

## 2018-03-10 ENCOUNTER — Encounter: Payer: Self-pay | Admitting: Cardiology

## 2018-03-10 ENCOUNTER — Ambulatory Visit: Payer: BLUE CROSS/BLUE SHIELD | Admitting: Cardiology

## 2018-03-10 VITALS — BP 144/88 | HR 88 | Ht 63.0 in | Wt 204.4 lb

## 2018-03-10 DIAGNOSIS — I639 Cerebral infarction, unspecified: Secondary | ICD-10-CM

## 2018-03-10 DIAGNOSIS — I252 Old myocardial infarction: Secondary | ICD-10-CM | POA: Diagnosis not present

## 2018-03-10 DIAGNOSIS — I480 Paroxysmal atrial fibrillation: Secondary | ICD-10-CM | POA: Diagnosis not present

## 2018-03-10 DIAGNOSIS — I872 Venous insufficiency (chronic) (peripheral): Secondary | ICD-10-CM | POA: Diagnosis not present

## 2018-03-10 NOTE — Progress Notes (Signed)
Cardiology Office Note    Date:  03/10/2018   ID:  Elaine Jackson, DOB 01-Apr-1956, MRN [id number]  PCP:  [name], MD  Cardiologist:   Candee Furbish, MD   CC: here for follow up of AFIB  History of Present Illness:  Elaine Jackson is a 62 y.o. female with apical myocardial infarction on 12/20/14 due to distal LAD stenosis, no PCI with diabetes, hyperlipidemia, hypertension who had stroke February/18 with loop recorder implanted, post afib ablation 09/25/17, Dr. Rayann Heman here for follow-up. Overall she is feeling much better, symptoms have resolved. She spent a short time in rehabilitation. Her symptoms felt like her right side was heavy like concrete board over it and she had warning symptoms on the Sunday before the stroke.  Chest pain, no shortness of breath, no syncope, no orthopnea. Loop recorder is in place. Functioning well.   Visit with Dr. Rayann Heman 12/29/17 reviewed. Off AAD tx. CHADS VASC 6. Leg weakness ?high dose statin? Dr. Oneida Alar saw her, compression stockings, PRN follow up. ?Aches from venous congestion.  Been having some symptoms of cramping in her hands and pressure in her head. Have head and aura in eyes, cramp in hands like claw. Three weeks ago happened.  She feels that when she is in atrial fibrillation all of her symptoms are sensation she feels in her head.  Denies any new strokelike symptoms bleeding syncope orthopnea PND myalgias.  Past Medical History:  Diagnosis Date  . Allergic rhinitis, cause unspecified   . Allergy   . CAD (coronary artery disease)    LHC 12/20/2014 showed small to moderate apical infarction due to distal LAD stenosis with reestablished TIMI2 flow. No PCI. Recommended plavix for 1 yr and ASA for life.   . Chicken pox   . Chronic bronchitis ([place])   . Family history of breast cancer in first degree relative    sister age 24  . Heart murmur   . Hyperlipidemia   . Hypertension   . Hypothyroidism   . Malignant neoplasm skin of face 02/16/2007   Basal Cell Carcinoma  [facility].  . Measles   . Migraine <1982   "went away when I got pregnant" (09/25/2017)  . Myocardial infarction ([place]) 01/2015  . Obesity, unspecified   . Other chronic nonalcoholic liver disease   . Pre-diabetes   . Sleep related leg cramps   . Stroke (cerebrum) ([place])    due to large vessel disease; denies residual on 09/25/2017  . Thyroid nodule    10-2016   . Unspecified vitamin D deficiency   . Vision abnormalities     Past Surgical History:  Procedure Laterality Date  . ATRIAL FIBRILLATION ABLATION  09/25/2017  . ATRIAL FIBRILLATION ABLATION N/A 09/25/2017   Procedure: ATRIAL FIBRILLATION ABLATION;  Surgeon: Thompson Grayer, MD;  Location: [facility];  Service: Cardiovascular;  Laterality: N/A;  . CARDIAC CATHETERIZATION N/A 12/20/2014   Procedure: Left Heart Cath and Coronary Angiography;  Surgeon: Sanda Klein, MD;  Location: [facility];  Service: Cardiovascular;  Laterality: N/A;  . COLONOSCOPY  06/25/2007   [facility]. Normal. Repeat in ten years.  . CYSTOSTOMY W/ BLADDER DILATION  1960s   "bladder stem stretched"  . LAPAROSCOPIC CHOLECYSTECTOMY    . LAPAROSCOPIC GASTRIC BANDING  07/03/10   Dr. Sherrin Daisy; [facility]  . LOOP RECORDER INSERTION N/A 08/23/2016   Procedure: Loop Recorder Insertion;  Surgeon: Deboraha Sprang, MD;  Location: [facility];  Service: Cardiovascular;  Laterality:  N/A;  . SQUAMOUS CELL CARCINOMA EXCISION Left    neck  . THYROIDECTOMY N/A 12/17/2016   Procedure: TOTAL THYROIDECTOMY;  Surgeon: Excell Seltzer, MD;  Location: [facility];  Service: General;  Laterality: N/A;  . TONSILLECTOMY      Current Medications: Outpatient Medications Prior to Visit  Medication Sig Dispense Refill  . atorvastatin (LIPITOR) 80 MG tablet Take 80 mg by mouth daily.    Marland Kitchen azelastine (ASTELIN) 0.1 % nasal spray Place 2 sprays into both nostrils as needed for rhinitis. Use in each nostril as directed    . B COMPLEX-C-FOLIC ACID PO Take 1  tablet by mouth daily.    . cholecalciferol (VITAMIN D) 400 units TABS tablet Take 4,000 Units by mouth daily.    . CONTOUR NEXT TEST test strip AS DIRECTED 100 each 4  . diltiazem (CARDIZEM) 30 MG tablet TAKE 1 TABLET EVERY 4 HOURS AS NEEDED FOR RAPID AFIB HEART RATE >100 45 tablet 2  . ELIQUIS 5 MG TABS tablet TAKE 1 TABLET BY MOUTH TWICE A DAY 180 tablet 2  . fluticasone (FLONASE) 50 MCG/ACT nasal spray Place 2 sprays into both nostrils daily as needed for allergies. 48 g 3  . furosemide (LASIX) 20 MG tablet Take 20 mg by mouth as needed.    Marland Kitchen levothyroxine (SYNTHROID, LEVOTHROID) 125 MCG tablet Take 1 tablet (125 mcg total) by mouth daily before breakfast. BEFORE BREAKFAST WITH A FULL GLASS OF WATER. 90 tablet 3  . montelukast (SINGULAIR) 10 MG tablet Take 10 mg by mouth daily.    . potassium chloride SA (K-DUR,KLOR-CON) 20 MEQ tablet Take 20 mEq by mouth as needed. Take with lasix.    Marland Kitchen atorvastatin (LIPITOR) 80 MG tablet TAKE 1 TABLET (80 MG TOTAL) BY MOUTH DAILY AT 6 PM. (Patient not taking: Reported on 03/10/2018) 90 tablet 3  . Azelastine HCl 0.15 % SOLN Place 2 sprays into the nose 2 (two) times daily. (Patient not taking: Reported on 03/10/2018) 90 mL 4  . cholecalciferol (VITAMIN D) 1000 units tablet Take 1 tablet (1,000 Units total) by mouth daily. (Patient not taking: Reported on 03/10/2018)    . furosemide (LASIX) 20 MG tablet Take 1 tablet (20 mg total) by mouth daily. (Patient not taking: Reported on 03/10/2018) 90 tablet 0  . montelukast (SINGULAIR) 10 MG tablet TAKE 1 TABLET BY MOUTH EVERYDAY AT BEDTIME (Patient not taking: Reported on 03/10/2018) 90 tablet 3  . potassium chloride SA (KLOR-CON M20) 20 MEQ tablet Take 1 tablet (20 mEq total) by mouth daily. (Patient not taking: Reported on 03/10/2018) 90 tablet 0   No facility-administered medications prior to visit.      Allergies:   Sulfa antibiotics and Zofran [ondansetron hcl]   Social History   Socioeconomic History  .  Marital status: Married    Spouse name: Not on file  . Number of children: 1  . Years of education: Not on file  . Highest education level: Not on file  Occupational History  . Occupation: HAIR STYLIST    Employer: [employer]  . Financial resource strain: Not on file  . Food insecurity:    Worry: Not on file    Inability: Not on file  . Transportation needs:    Medical: Not on file    Non-medical: Not on file  Tobacco Use  . Smoking status: Former Smoker    Packs/day: 1.00    Years: 25.00    Pack years: 25.00    Types:  Cigarettes    Last attempt to quit: 03/26/1995    Years since quitting: 22.9  . Smokeless tobacco: Never Used  Substance and Sexual Activity  . Alcohol use: No  . Drug use: No  . Sexual activity: Not Currently    Birth control/protection: Post-menopausal  Lifestyle  . Physical activity:    Days per week: Not on file    Minutes per session: Not on file  . Stress: Not on file  Relationships  . Social connections:    Talks on phone: Not on file    Gets together: Not on file    Attends religious service: Not on file    Active member of club or organization: Not on file    Attends meetings of clubs or organizations: Not on file    Relationship status: Not on file  Other Topics Concern  . Not on file  Social History Narrative   Marital status:  Married; x 27 years. Second marriage. 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      Tobacco:  None      Alcohol: rare/minimal.      Drugs:  None       Exercise:        Seatbelt:  100%.       Sunscreen: SPF 15.      Guns:  Maybe?     Family History:  The patient's family history includes Arthritis in her brother; Breast cancer (age of onset: 8) in her sister; Cancer in her sister; Cervical cancer in her sister; Congestive Heart Failure in  her father and mother; Dementia in her mother; Diabetes in her brother, father, mother, and sister; Heart disease in her father, maternal grandfather, mother, paternal grandfather, and paternal grandmother; Hepatitis C (age of onset: 73) in her sister; Hyperlipidemia in her brother, father, and mother; Hypertension in her brother, father, mother, sister, and sister; Kidney disease in her father and sister; Kidney failure in her father; Liver cancer (age of onset: 78) in her sister; Stroke in her father, maternal grandmother, paternal grandfather, and paternal grandmother.   ROS:   Please see the history of present illness.    Review of Systems  All other systems reviewed and are negative.     PHYSICAL EXAM:   VS:  BP (!) 144/88   Pulse 88   Ht 5\' 3"  (1.6 m)   Wt 204 lb 6.4 oz (92.7 kg)   SpO2 96%   BMI 36.21 kg/m    GEN: Well nourished, well developed, in no acute distress obese HEENT: normal  Neck: no JVD, carotid bruits, or masses Cardiac: RRR; no murmurs, rubs, or gallops,no edema  Respiratory:  clear to auscultation bilaterally, normal work of breathing GI: soft, nontender, nondistended, + BS MS: no deformity or atrophy  Skin: warm and dry, no rash Neuro:  Alert and Oriented x 3, Strength and sensation are intact Psych: euthymic mood, full affect   Wt Readings from Last 3 Encounters:  03/10/18 204 lb 6.4 oz (92.7 kg)  01/19/18 206 lb 12.8 oz (93.8 kg)  12/29/17 205 lb (93 kg)      Studies/Labs Reviewed:   EKG:  None today  Recent Labs: 12/22/2017: ALT 18; BUN 20; Creatinine, Ser 1.00; Hemoglobin 12.4; Platelets 292; Potassium 4.7; Sodium 142; TSH 6.700  Lipid Panel    Component Value Date/Time   CHOL 147 12/22/2017 0846   TRIG 80 12/22/2017 0846   HDL 60 12/22/2017 0846   CHOLHDL 2.5 12/22/2017 0846   CHOLHDL 2.4 08/21/2016 0147   VLDL 11 08/21/2016 0147   LDLCALC 71 12/22/2017 0846    Additional studies/ records that were reviewed today include:  Hospital  records, echocardiogram, EKG reviewed    ASSESSMENT:    1. Cerebrovascular accident (CVA), unspecified mechanism ([place])   2. Paroxysmal atrial fibrillation ([place])   3. Venous (peripheral) insufficiency   4. Old MI (myocardial infarction)      PLAN:  In order of problems listed above:  Stroke 08/20/16  - Symptoms of right-sided sensation of heaviness, feeling of concrete. She is a Theme park manager. Her symptoms have resolved thankfully. She did have some bradycardia surrounding her stroke acute on chronic. Echocardiogram showed normal ejection fraction.  Given the discovery of atrial fibrillation, we will continue with Eliquis.  AFIB paroxysmal  - Post AFIB ablation Dr. Rayann Heman 09/2017. Off AAD. 2 AFIB episodes noted on 12/2017 interrogation of loop.   She has felt some pounding in her chest previously.  She does have some occasional symptoms of what sounds like migraine aura, scintillations, pressure in her head which she thinks may be her symptoms of atrial fibrillation, also has some hand cramping associated with that.  These would be highly unusual symptoms and are likely noncardiac but are truly migraines.  She has had a history of migraines in the past.  Migraines may be a trigger for atrial fibrillation in certain situations however.  I asked her to address this with Dr. Tamala Julian.  Overall reassurance has been given.  Continue with monitoring of her loop recorder.  She has diltiazem 30 mg as needed.  Coronary artery disease status post old MI apical  -Overall doing quite well, not having any anginal symptoms..  Hyperlipidemia  - Continue with atorvastatin.  LDL 71, excellent, atorvastatin 80.  Morbid obesity -Continue to encourage weight loss.  BMI greater than 35 with 2 or more comorbidities.  Decrease carbohydrates.  Medication Adjustments/Labs and Tests Ordered: Current medicines are reviewed at length with the patient today.  Concerns regarding medicines are outlined above.  Medication  changes, Labs and Tests ordered today are listed in the Patient Instructions below. Patient Instructions  Medication Instructions:  The current medical regimen is effective;  continue present plan and medications.  Follow-Up: Follow up in 1 year with Dr. Marlou Porch.  You will receive a letter in the mail 2 months before you are due.  Please call us when you receive this letter to schedule your follow up appointment.  If you need a refill on your cardiac medications before your next appointment, please call your pharmacy.  Thank you for choosing [facility]!!        Signed, Candee Furbish, MD  03/10/2018 8:54 AM    [facility] [place], [place], [place]  [address] Phone: [phone]; Fax: [phone]

## 2018-03-10 NOTE — Patient Instructions (Signed)

## 2018-03-12 LAB — CUP PACEART REMOTE DEVICE CHECK
Date Time Interrogation Session: 20190813183932
MDC IDC PG IMPLANT DT: 20180302

## 2018-03-22 LAB — CUP PACEART REMOTE DEVICE CHECK
Implantable Pulse Generator Implant Date: 20180302
MDC IDC SESS DTM: 20190915193750

## 2018-04-06 ENCOUNTER — Ambulatory Visit: Payer: BLUE CROSS/BLUE SHIELD | Admitting: Internal Medicine

## 2018-04-06 ENCOUNTER — Encounter: Payer: Self-pay | Admitting: Internal Medicine

## 2018-04-06 VITALS — BP 118/80 | HR 75 | Ht 62.5 in | Wt 201.8 lb

## 2018-04-06 DIAGNOSIS — I2510 Atherosclerotic heart disease of native coronary artery without angina pectoris: Secondary | ICD-10-CM | POA: Diagnosis not present

## 2018-04-06 DIAGNOSIS — I480 Paroxysmal atrial fibrillation: Secondary | ICD-10-CM

## 2018-04-06 LAB — CUP PACEART INCLINIC DEVICE CHECK
Implantable Pulse Generator Implant Date: 20180302
MDC IDC SESS DTM: 20191014110025

## 2018-04-06 NOTE — Patient Instructions (Addendum)
Medication Instructions:  Your physician recommends that you continue on your current medications as directed. Please refer to the Current Medication list given to you today.  Labwork: None ordered.  Testing/Procedures: None ordered.  Follow-Up: Your physician wants you to follow-up in: 6 months with Renee Ursuy, PA. You will receive a reminder letter in the mail two months in advance. If you don't receive a letter, please call our office to schedule the follow-up appointment.   Any Other Special Instructions Will Be Listed Below (If Applicable).     If you need a refill on your cardiac medications before your next appointment, please call your pharmacy.  

## 2018-04-06 NOTE — Progress Notes (Signed)
PCP: [name], MD Primary Cardiologist: Dr Marlou Porch Primary EP: Dr Rayann Heman  Elaine Jackson is a 62 y.o. female who presents today for routine electrophysiology followup.  Since last being seen in our clinic, the patient reports doing very well.  Today, she denies symptoms of palpitations, chest pain, shortness of breath,  lower extremity edema, dizziness, presyncope, or syncope.  The patient is otherwise without complaint today.   Past Medical History:  Diagnosis Date  . Allergic rhinitis, cause unspecified   . Allergy   . CAD (coronary artery disease)    LHC 12/20/2014 showed small to moderate apical infarction due to distal LAD stenosis with reestablished TIMI2 flow. No PCI. Recommended plavix for 1 yr and ASA for life.   . Chicken pox   . Chronic bronchitis ([place])   . Family history of breast cancer in first degree relative    sister age 63  . Heart murmur   . Hyperlipidemia   . Hypertension   . Hypothyroidism   . Malignant neoplasm skin of face 02/16/2007   Basal Cell Carcinoma  [place].  . Measles   . Migraine <1982   "went away when I got pregnant" (09/25/2017)  . Myocardial infarction ([place]) 01/2015  . Obesity, unspecified   . Other chronic nonalcoholic liver disease   . Pre-diabetes   . Sleep related leg cramps   . Stroke (cerebrum) ([place])    due to large vessel disease; denies residual on 09/25/2017  . Thyroid nodule    10-2016   . Unspecified vitamin D deficiency   . Vision abnormalities    Past Surgical History:  Procedure Laterality Date  . ATRIAL FIBRILLATION ABLATION  09/25/2017  . ATRIAL FIBRILLATION ABLATION N/A 09/25/2017   Procedure: ATRIAL FIBRILLATION ABLATION;  Surgeon: Thompson Grayer, MD;  Location: [facility];  Service: Cardiovascular;  Laterality: N/A;  . CARDIAC CATHETERIZATION N/A 12/20/2014   Procedure: Left Heart Cath and Coronary Angiography;  Surgeon: Sanda Klein, MD;  Location: [facility];  Service: Cardiovascular;  Laterality:  N/A;  . COLONOSCOPY  06/25/2007   [facility]. Normal. Repeat in ten years.  . CYSTOSTOMY W/ BLADDER DILATION  1960s   "bladder stem stretched"  . LAPAROSCOPIC CHOLECYSTECTOMY    . LAPAROSCOPIC GASTRIC BANDING  07/03/10   Dr. Sherrin Daisy; [facility]  . LOOP RECORDER INSERTION N/A 08/23/2016   Procedure: Loop Recorder Insertion;  Surgeon: Deboraha Sprang, MD;  Location: [facility];  Service: Cardiovascular;  Laterality: N/A;  . SQUAMOUS CELL CARCINOMA EXCISION Left    neck  . THYROIDECTOMY N/A 12/17/2016   Procedure: TOTAL THYROIDECTOMY;  Surgeon: Excell Seltzer, MD;  Location: [facility];  Service: General;  Laterality: N/A;  . TONSILLECTOMY      ROS- all systems are reviewed and negatives except as per HPI above  Current Outpatient Medications  Medication Sig Dispense Refill  . atorvastatin (LIPITOR) 80 MG tablet Take 80 mg by mouth daily.    Marland Kitchen azelastine (ASTELIN) 0.1 % nasal spray Place 2 sprays into both nostrils as needed for rhinitis. Use in each nostril as directed    . B COMPLEX-C-FOLIC ACID PO Take 1 tablet by mouth daily.    . cholecalciferol (VITAMIN D) 400 units TABS tablet Take 4,000 Units by mouth daily.    . CONTOUR NEXT TEST test strip AS DIRECTED 100 each 4  . diltiazem (CARDIZEM) 30 MG tablet TAKE 1 TABLET EVERY 4 HOURS AS NEEDED FOR RAPID AFIB HEART RATE >100 45  tablet 2  . ELIQUIS 5 MG TABS tablet TAKE 1 TABLET BY MOUTH TWICE A DAY 180 tablet 2  . fluticasone (FLONASE) 50 MCG/ACT nasal spray Place 2 sprays into both nostrils daily as needed for allergies. 48 g 3  . furosemide (LASIX) 20 MG tablet Take 20 mg by mouth as needed.    Marland Kitchen levothyroxine (SYNTHROID, LEVOTHROID) 125 MCG tablet Take 1 tablet (125 mcg total) by mouth daily before breakfast. BEFORE BREAKFAST WITH A FULL GLASS OF WATER. 90 tablet 3  . montelukast (SINGULAIR) 10 MG tablet Take 10 mg by mouth daily.    . potassium chloride SA (K-DUR,KLOR-CON) 20 MEQ tablet Take 20 mEq by mouth as needed. Take with  lasix.     No current facility-administered medications for this visit.     Physical Exam: Vitals:   04/06/18 1011  BP: 118/80  Pulse: 75  SpO2: 99%  Weight: 201 lb 12.8 oz (91.5 kg)  Height: 5' 2.5" (1.588 m)    GEN- The patient is well appearing, alert and oriented x 3 today.   Head- normocephalic, atraumatic Eyes-  Sclera clear, conjunctiva pink Ears- hearing intact Oropharynx- clear Lungs- Clear to ausculation bilaterally, normal work of breathing Heart- Regular rate and rhythm, no murmurs, rubs or gallops, PMI not laterally displaced GI- soft, NT, ND, + BS Extremities- no clubbing, cyanosis, or edema  Wt Readings from Last 3 Encounters:  04/06/18 201 lb 12.8 oz (91.5 kg)  03/10/18 204 lb 6.4 oz (92.7 kg)  01/19/18 206 lb 12.8 oz (93.8 kg)    EKG tracing ordered today is personally reviewed and shows sinus rhythm, 75 bpm, PR 156 msec, QRS 86 msec, Qtc 437 msec, nonspecific St/T changes  Assessment and Plan:  1. Paroxysmal atrial fibrillation and atrial flutter Doing very well post ablation off AAD therapy No AF since last visit by ILR interrogation today.  There has been sinus with PACs misclassified as afib.  We will increasing detection to 6 minutes to avoid this. chads2vasc score is 6.  Continue anticoagulation Lifestyle modification encouraged  2. Obesity Body mass index is 36.32 kg/m. Wt Readings from Last 3 Encounters:  04/06/18 201 lb 12.8 oz (91.5 kg)  03/10/18 204 lb 6.4 oz (92.7 kg)  01/19/18 206 lb 12.8 oz (93.8 kg)    3. CAD No ischemic symptoms  Return in 6 months to see EP PA [facility]  Thompson Grayer MD, [facility] 04/06/2018 10:17 AM

## 2018-04-10 ENCOUNTER — Ambulatory Visit (INDEPENDENT_AMBULATORY_CARE_PROVIDER_SITE_OTHER): Payer: BLUE CROSS/BLUE SHIELD | Admitting: *Deleted

## 2018-04-10 DIAGNOSIS — I639 Cerebral infarction, unspecified: Secondary | ICD-10-CM | POA: Diagnosis not present

## 2018-04-13 NOTE — Progress Notes (Signed)
Carelink Summary Report / Loop Recorder 

## 2018-04-28 ENCOUNTER — Other Ambulatory Visit: Payer: Self-pay | Admitting: Physician Assistant

## 2018-04-28 LAB — CUP PACEART REMOTE DEVICE CHECK
Date Time Interrogation Session: 20191018193733
Implantable Pulse Generator Implant Date: 20180302

## 2018-05-13 ENCOUNTER — Ambulatory Visit (INDEPENDENT_AMBULATORY_CARE_PROVIDER_SITE_OTHER): Payer: BLUE CROSS/BLUE SHIELD

## 2018-05-13 DIAGNOSIS — I639 Cerebral infarction, unspecified: Secondary | ICD-10-CM

## 2018-05-14 NOTE — Telephone Encounter (Signed)
Of course, follow up with your PCP, but you might consider Famotidine 20mg  once a day in place of your ranitidine. This is over the counter.   Candee Furbish, MD

## 2018-05-14 NOTE — Progress Notes (Signed)
Carelink Summary Report / Loop Recorder 

## 2018-05-18 ENCOUNTER — Telehealth: Payer: Self-pay | Admitting: Cardiology

## 2018-05-18 NOTE — Telephone Encounter (Signed)
Spoke w/ pt and requested that she send a manual transmission b/c her home monitor has not updated in at least 14 days.   

## 2018-06-09 ENCOUNTER — Other Ambulatory Visit: Payer: Self-pay | Admitting: Physician Assistant

## 2018-06-15 ENCOUNTER — Ambulatory Visit (INDEPENDENT_AMBULATORY_CARE_PROVIDER_SITE_OTHER): Payer: BLUE CROSS/BLUE SHIELD

## 2018-06-15 DIAGNOSIS — I639 Cerebral infarction, unspecified: Secondary | ICD-10-CM | POA: Diagnosis not present

## 2018-06-15 LAB — CUP PACEART REMOTE DEVICE CHECK
Implantable Pulse Generator Implant Date: 20180302
MDC IDC SESS DTM: 20191223172904

## 2018-06-16 NOTE — Progress Notes (Signed)
Carelink Summary Report / Loop Recorder 

## 2018-06-28 LAB — CUP PACEART REMOTE DEVICE CHECK
Implantable Pulse Generator Implant Date: 20180302
MDC IDC SESS DTM: 20191120194027

## 2018-07-11 ENCOUNTER — Other Ambulatory Visit (HOSPITAL_COMMUNITY): Payer: Self-pay | Admitting: Nurse Practitioner

## 2018-07-20 ENCOUNTER — Ambulatory Visit (INDEPENDENT_AMBULATORY_CARE_PROVIDER_SITE_OTHER): Payer: BLUE CROSS/BLUE SHIELD

## 2018-07-20 DIAGNOSIS — I639 Cerebral infarction, unspecified: Secondary | ICD-10-CM

## 2018-07-20 DIAGNOSIS — R001 Bradycardia, unspecified: Secondary | ICD-10-CM

## 2018-07-21 LAB — CUP PACEART REMOTE DEVICE CHECK
MDC IDC PG IMPLANT DT: 20180302
MDC IDC SESS DTM: 20200125210852

## 2018-07-21 NOTE — Progress Notes (Signed)
Carelink Summary Report / Loop Recorder 

## 2018-08-20 ENCOUNTER — Ambulatory Visit (INDEPENDENT_AMBULATORY_CARE_PROVIDER_SITE_OTHER): Payer: BLUE CROSS/BLUE SHIELD | Admitting: *Deleted

## 2018-08-20 DIAGNOSIS — I639 Cerebral infarction, unspecified: Secondary | ICD-10-CM

## 2018-08-21 LAB — CUP PACEART REMOTE DEVICE CHECK
Implantable Pulse Generator Implant Date: 20180302
MDC IDC SESS DTM: 20200227211140

## 2018-08-26 NOTE — Progress Notes (Signed)
Carelink Summary Report / Loop Recorder 

## 2018-09-22 ENCOUNTER — Other Ambulatory Visit: Payer: Self-pay

## 2018-09-22 ENCOUNTER — Ambulatory Visit (INDEPENDENT_AMBULATORY_CARE_PROVIDER_SITE_OTHER): Payer: BLUE CROSS/BLUE SHIELD | Admitting: *Deleted

## 2018-09-22 DIAGNOSIS — I639 Cerebral infarction, unspecified: Secondary | ICD-10-CM

## 2018-09-23 LAB — CUP PACEART REMOTE DEVICE CHECK
Date Time Interrogation Session: 20200331214115
Implantable Pulse Generator Implant Date: 20180302

## 2018-09-30 NOTE — Progress Notes (Signed)
Carelink Summary Report / Loop Recorder 

## 2018-10-09 ENCOUNTER — Telehealth: Payer: Self-pay | Admitting: Cardiology

## 2018-10-09 ENCOUNTER — Telehealth: Payer: Self-pay

## 2018-10-09 NOTE — Telephone Encounter (Signed)
New Message;    Pt says her old monitor is not working, but they have sent her a new one.

## 2018-10-09 NOTE — Telephone Encounter (Signed)
Left message for patient to remind of missed remote transmission.  

## 2018-10-12 NOTE — Telephone Encounter (Signed)
Called pt back. She stated that she has received the new monitor. Instructed her how to send a manual transmission. Transmission received.   Pt also had questions about her bill. I instructed her to call the billing department.

## 2018-10-26 ENCOUNTER — Other Ambulatory Visit: Payer: Self-pay

## 2018-10-26 ENCOUNTER — Ambulatory Visit (INDEPENDENT_AMBULATORY_CARE_PROVIDER_SITE_OTHER): Payer: BLUE CROSS/BLUE SHIELD | Admitting: *Deleted

## 2018-10-26 DIAGNOSIS — I639 Cerebral infarction, unspecified: Secondary | ICD-10-CM

## 2018-10-26 LAB — CUP PACEART REMOTE DEVICE CHECK
Date Time Interrogation Session: 20200503213719
Implantable Pulse Generator Implant Date: 20180302

## 2018-11-02 NOTE — Progress Notes (Signed)
Carelink Summary Report / Loop Recorder 

## 2018-11-27 ENCOUNTER — Ambulatory Visit (INDEPENDENT_AMBULATORY_CARE_PROVIDER_SITE_OTHER): Payer: BLUE CROSS/BLUE SHIELD | Admitting: *Deleted

## 2018-11-27 DIAGNOSIS — I639 Cerebral infarction, unspecified: Secondary | ICD-10-CM | POA: Diagnosis not present

## 2018-11-27 LAB — CUP PACEART REMOTE DEVICE CHECK
Date Time Interrogation Session: 20200605211353
Implantable Pulse Generator Implant Date: 20180302

## 2018-12-04 IMAGING — CR DG CHEST 2V
2 series · 2 of 2 positions shown · non-contrast
Comparison: Portable chest x-ray December 19, 2014

CLINICAL DATA: Preoperative exam prior total thyroidectomy.
Recently treated sinus infection. Previous history of smoking,
atrial fibrillation, hypertension, previous MI.

EXAM:
CHEST  2 VIEW

[w chest pa]
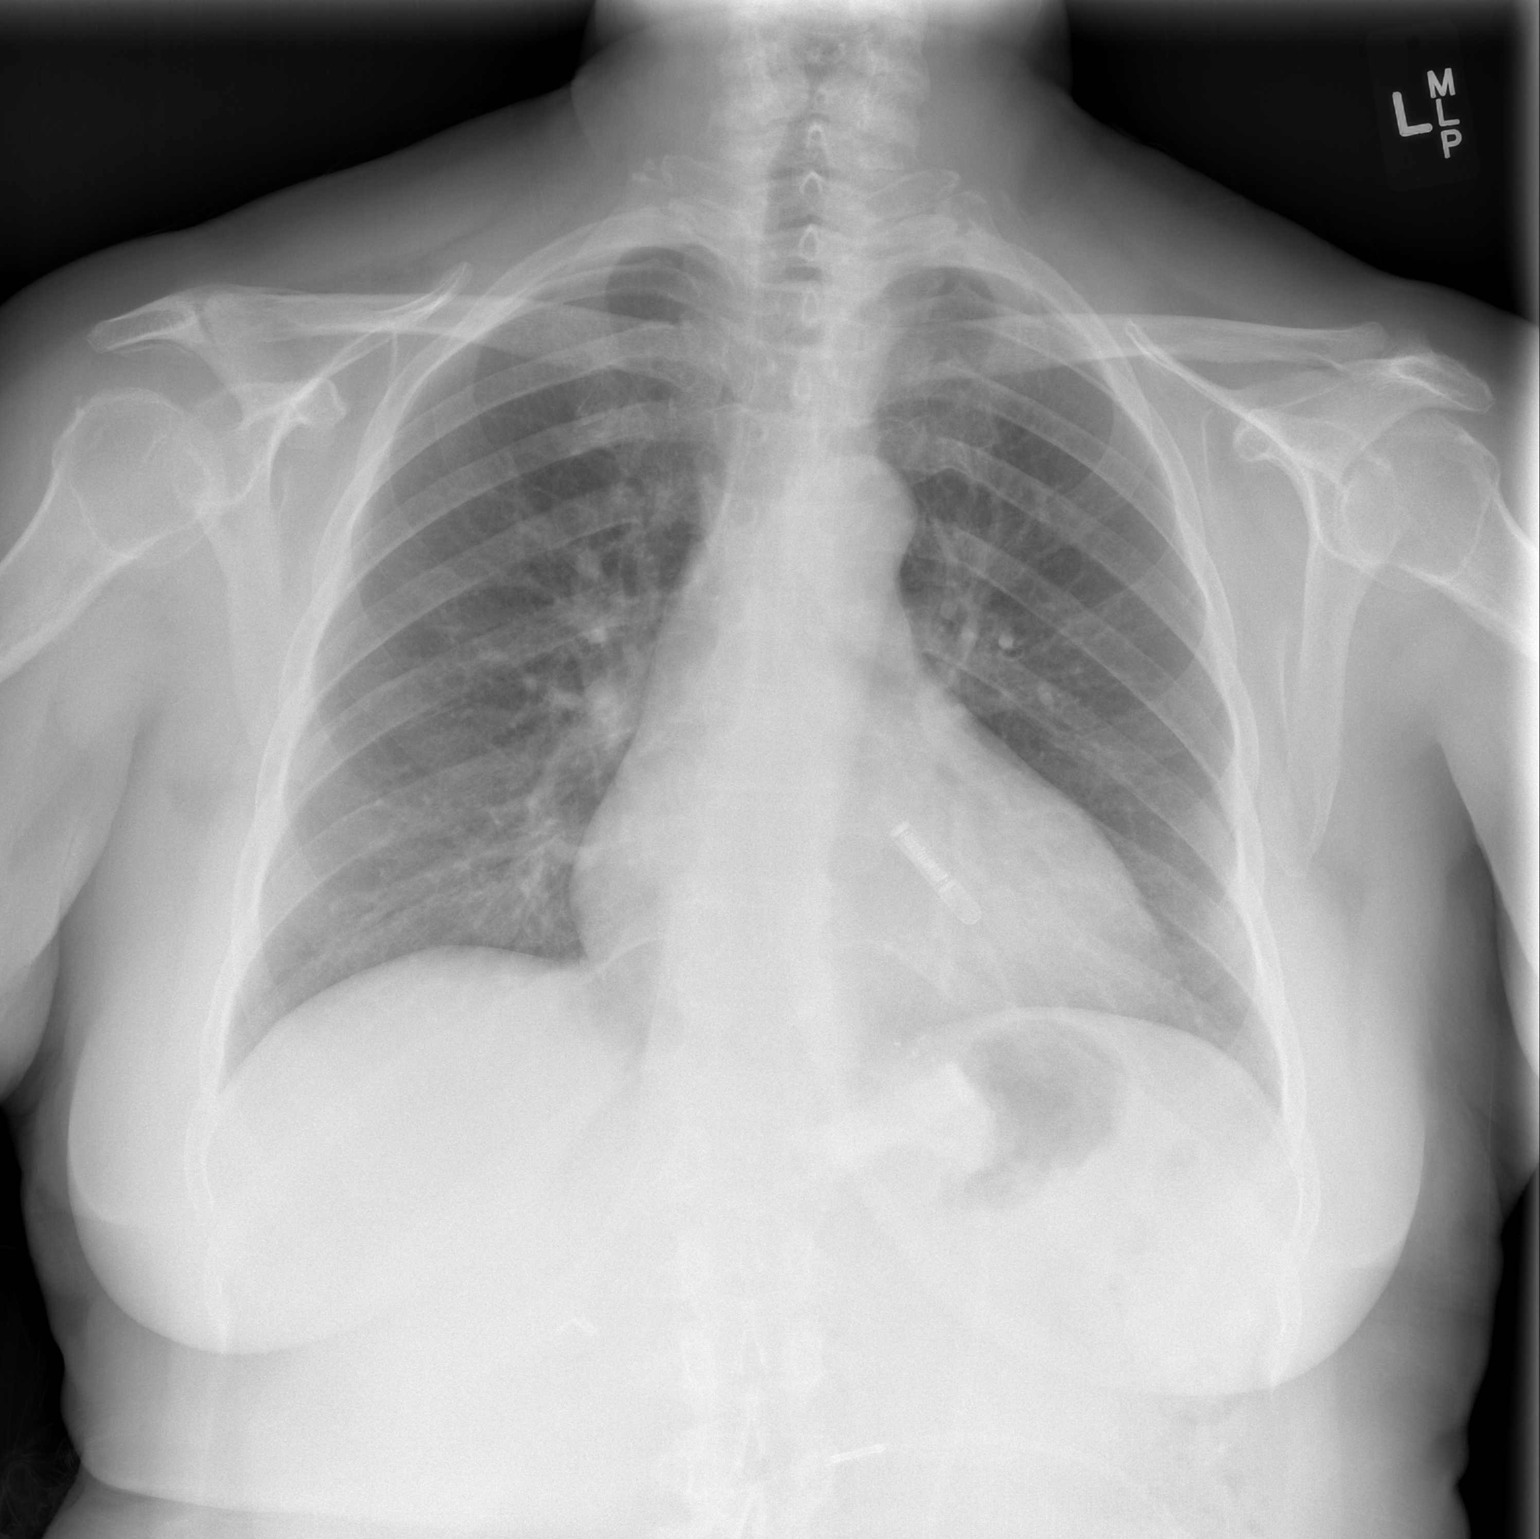

[w chest lat]
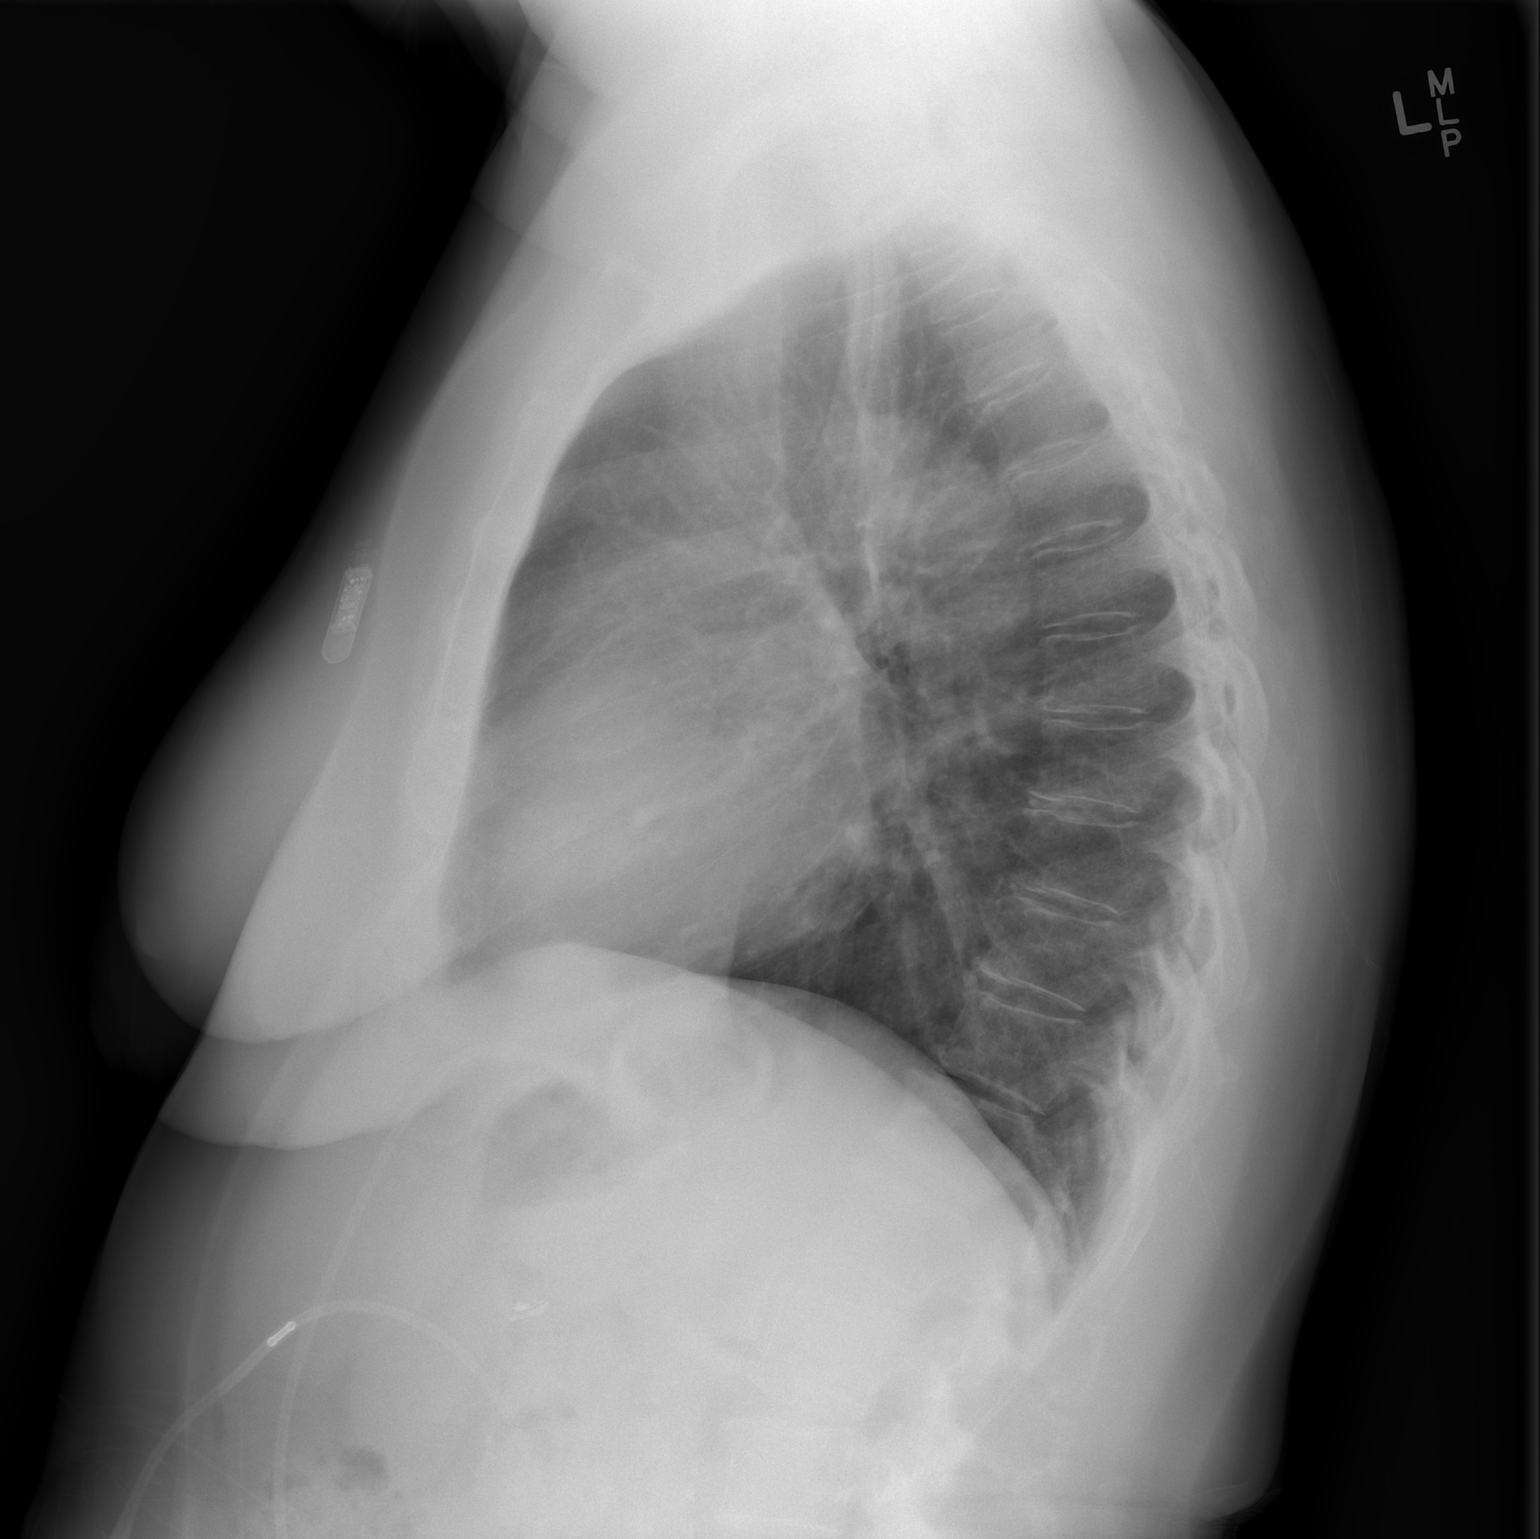

[2 of 2 positions shown; findings below may reference images not displayed]

FINDINGS: The lungs are adequately inflated and clear. The heart is top-normal
in size. The pulmonary vascularity is normal. An implantable cardiac
rhythm recording device is present. There is an inflatable anti
esophageal reflux appliance present. There is no pleural effusion.
There is gentle dextrocurvature centered at the thoracolumbar spine.
IMPRESSION: There is no acute cardiopulmonary abnormality. Top-normal cardiac
size.

## 2018-12-04 NOTE — Progress Notes (Signed)
Carelink Summary Report / Loop Recorder 

## 2018-12-09 ENCOUNTER — Telehealth: Payer: Self-pay

## 2018-12-09 NOTE — Telephone Encounter (Addendum)
Spoke to pt regarding LINQ alert, requested manual transmission. Pt states on 12/07/18, she felt her "heart pounding", had high BP (255Q systolic), and heart rate was 143; reports she took a diltiazem.   Manual transmission received. Available ECGs appear to be true AF, duration 9.5 hours. Will route to Dr. Rayann Heman for recommendations.

## 2018-12-11 LAB — HM MAMMOGRAPHY

## 2018-12-17 ENCOUNTER — Telehealth: Payer: Self-pay

## 2018-12-17 NOTE — Telephone Encounter (Signed)
Spoke with pt regarding appt on 12/18/18. Pt stated she will check vitals prior to appt and did not have any questions at this time.

## 2018-12-18 ENCOUNTER — Encounter: Payer: Self-pay | Admitting: Internal Medicine

## 2018-12-18 ENCOUNTER — Telehealth (INDEPENDENT_AMBULATORY_CARE_PROVIDER_SITE_OTHER): Payer: BLUE CROSS/BLUE SHIELD | Admitting: Internal Medicine

## 2018-12-18 VITALS — BP 129/77 | HR 95 | Ht 62.5 in | Wt 204.0 lb

## 2018-12-18 DIAGNOSIS — I480 Paroxysmal atrial fibrillation: Secondary | ICD-10-CM | POA: Diagnosis not present

## 2018-12-18 MED ORDER — DILTIAZEM HCL ER COATED BEADS 120 MG PO CP24: 120.0000 mg | ORAL_CAPSULE | Freq: Every day | ORAL | 2 refills | Status: DC

## 2018-12-18 NOTE — Progress Notes (Signed)
Electrophysiology TeleHealth Note   Due to national recommendations of social distancing due to COVID 19, an audio/video telehealth visit is felt to be most appropriate for this patient at this time.  See MyChart message from today for the patient's consent to telehealth for [facility].   Date:  12/18/2018   ID:  Elaine Jackson, DOB 1956/04/21, MRN [id number]  Location: patient's home  Provider location:  [facility]  Evaluation Performed: Follow-up visit  PCP:  [name], MD   Electrophysiologist:  Dr Rayann Heman  Chief Complaint:  palpitations  History of Present Illness:    Elaine Jackson is a 63 y.o. female who presents via telehealth conferencing today.  Since last being seen in our clinic, the patient reports doing reasonably well. Over the past 2 weeks, she has had difficulty with spikes in BP and HR.  She has had recurrence of afib.  This began 3 days after steroid injection for plantar fasciitis.  She is discouraged because previously she had not had afib in several years. Today, she denies symptoms of chest pain, shortness of breath,  lower extremity edema, dizziness, presyncope, or syncope.  The patient is otherwise without complaint today.  The patient denies symptoms of fevers, chills, cough, or new SOB worrisome for COVID 19.  Past Medical History:  Diagnosis Date   Allergic rhinitis, cause unspecified    Allergy    CAD (coronary artery disease)    LHC 12/20/2014 showed small to moderate apical infarction due to distal LAD stenosis with reestablished TIMI2 flow. No PCI. Recommended plavix for 1 yr and ASA for life.    Chicken pox    Chronic bronchitis (HCC)    Family history of breast cancer in first degree relative    sister age 30   Heart murmur    Hyperlipidemia    Hypertension    Hypothyroidism    Malignant neoplasm skin of face 02/16/2007   Basal Cell Carcinoma  [place].   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; denies residual on 09/25/2017   Thyroid nodule    10-2016    Unspecified vitamin D deficiency    Vision abnormalities     Past Surgical History:  Procedure Laterality Date   ATRIAL FIBRILLATION ABLATION  09/25/2017   ATRIAL FIBRILLATION ABLATION N/A 09/25/2017   Procedure: ATRIAL FIBRILLATION ABLATION;  Surgeon: Thompson Grayer, MD;  Location: [place] [facility];  Service: Cardiovascular;  Laterality: N/A;   CARDIAC CATHETERIZATION N/A 12/20/2014   Procedure: Left Heart Cath and Coronary Angiography;  Surgeon: Sanda Klein, MD;  Location: [place] [facility];  Service: Cardiovascular;  Laterality: N/A;   COLONOSCOPY  06/25/2007   [place]. Normal. Repeat in ten years.   CYSTOSTOMY W/ BLADDER DILATION  1960s   "bladder stem stretched"   LAPAROSCOPIC CHOLECYSTECTOMY     LAPAROSCOPIC GASTRIC BANDING  07/03/10   Dr. Sherrin Daisy; [place]   LOOP RECORDER INSERTION N/A 08/23/2016   Procedure: Loop Recorder Insertion;  Surgeon: Deboraha Sprang, MD;  Location: [place] [facility];  Service: Cardiovascular;  Laterality: N/A;   SQUAMOUS CELL CARCINOMA EXCISION Left    neck   THYROIDECTOMY N/A 12/17/2016   Procedure:  TOTAL THYROIDECTOMY;  Surgeon: Excell Seltzer, MD;  Location: [facility];  Service: General;  Laterality: N/A;   TONSILLECTOMY      Current Outpatient Medications  Medication Sig Dispense Refill   atorvastatin (LIPITOR) 80 MG tablet Take 80 mg by mouth daily.     azelastine (ASTELIN) 0.1 % nasal spray Place 2 sprays into both nostrils as needed for rhinitis. Use in each nostril as directed     B COMPLEX-C-FOLIC ACID PO Take 1 tablet by mouth daily.     cholecalciferol (VITAMIN D) 400 units TABS tablet Take 4,000 Units by mouth daily.     CONTOUR  NEXT TEST test strip AS DIRECTED 100 each 4   diltiazem (CARDIZEM) 30 MG tablet TAKE 1 TABLET EVERY 4 HOURS AS NEEDED FOR RAPID AFIB HEART RATE >100 45 tablet 2   ELIQUIS 5 MG TABS tablet TAKE 1 TABLET BY MOUTH TWICE A DAY 180 tablet 2   fluticasone (FLONASE) 50 MCG/ACT nasal spray Place 2 sprays into both nostrils daily as needed for allergies. 48 g 3   furosemide (LASIX) 20 MG tablet Take 20 mg by mouth as needed.     levothyroxine (SYNTHROID, LEVOTHROID) 125 MCG tablet Take 1 tablet (125 mcg total) by mouth daily before breakfast. BEFORE BREAKFAST WITH A FULL GLASS OF WATER. 90 tablet 3   montelukast (SINGULAIR) 10 MG tablet Take 10 mg by mouth daily.     potassium chloride SA (K-DUR,KLOR-CON) 20 MEQ tablet Take 20 mEq by mouth as needed. Take with lasix.     No current facility-administered medications for this visit.     Allergies:   Sulfa antibiotics and Zofran [ondansetron hcl]   Social History:  The patient  reports that she quit smoking about 23 years ago. Her smoking use included cigarettes. She has a 25.00 pack-year smoking history. She has never used smokeless tobacco. She reports that she does not drink alcohol or use drugs.   Family History:  The patient's  family history includes Arthritis in her brother; Breast cancer (age of onset: 30) in her sister; Cancer in her sister; Cervical cancer in her sister; Congestive Heart Failure in her father and mother; Dementia in her mother; Diabetes in her brother, father, mother, and sister; Heart disease in her father, maternal grandfather, mother, paternal grandfather, and paternal grandmother; Hepatitis C (age of onset: 37) in her sister; Hyperlipidemia in her brother, father, and mother; Hypertension in her brother, father, mother, sister, and sister; Kidney disease in her father and sister; Kidney failure in her father; Liver cancer (age of onset: 103) in her sister; Stroke in her father, maternal grandmother, paternal grandfather, and  paternal grandmother.   ROS:  Please see the history of present illness.   All other systems are personally reviewed and negative.    Exam:    Vital Signs:  BP 129/77    Pulse 95    Ht 5' 2.5" (1.588 m)    Wt 204 lb (92.5 kg)    BMI 36.72 kg/m   Well appearing, NAD, OP clear, normal WOB   Labs/Other Tests and Data Reviewed:    Recent Labs: 12/22/2017: ALT 18; BUN 20; Creatinine, Ser 1.00; Hemoglobin 12.4; Platelets 292; Potassium 4.7; Sodium 142; TSH 6.700   Wt Readings from Last 3 Encounters:  12/18/18 204 lb (92.5 kg)  04/06/18 201 lb 12.8 oz (91.5 kg)  03/10/18 204 lb 6.4 oz (92.7 kg)    Recent ILR alert is reviewed and documents AF with RVR.  ASSESSMENT &  PLAN:    1.  Paroxysmal atrial fibrillation This occurs after being quiescent for over a year post ablation, in the setting of recent steroid injection. I suspect that her recent steroids are the cause for her episode. I will start diltiazem CD 120mg  daily today. Hopefully as her steroids wear off, this can be discontinued. If she has additional episodes, short term AAD therapy (such as multaq) would be reasonable. She remains on eliquis for chads2vasc score of 6 Continue to follow with ILR  2. Overweight Lifestyle modification is encouraged  3. CAD No ischemic symptoms No changes  Follow-up: 4 weeks in AF clinic, 8 weeks with me   Patient Risk:  after full review of this patients clinical status, I feel that they are at moderate risk at this time.  Today, I have spent 20 minutes with the patient with telehealth technology discussing arrhythmia management .    SignedThompson Grayer, MD  12/18/2018 3:36 PM     [address] [place] [address] [place] [address] [phone] (office) [phone] (fax)

## 2018-12-29 ENCOUNTER — Encounter (HOSPITAL_COMMUNITY): Payer: Self-pay

## 2018-12-30 ENCOUNTER — Ambulatory Visit (INDEPENDENT_AMBULATORY_CARE_PROVIDER_SITE_OTHER): Payer: BLUE CROSS/BLUE SHIELD | Admitting: *Deleted

## 2018-12-30 DIAGNOSIS — I639 Cerebral infarction, unspecified: Secondary | ICD-10-CM

## 2018-12-31 LAB — CUP PACEART REMOTE DEVICE CHECK
Date Time Interrogation Session: 20200708221156
Implantable Pulse Generator Implant Date: 20180302

## 2019-01-04 NOTE — Telephone Encounter (Signed)
Appointment made for 02/17/19.

## 2019-01-11 NOTE — Progress Notes (Signed)
Carelink Summary Report / Loop Recorder 

## 2019-01-19 NOTE — Progress Notes (Signed)
No mammographic evidence of malignancy

## 2019-01-20 ENCOUNTER — Ambulatory Visit (HOSPITAL_COMMUNITY)
Admission: RE | Admit: 2019-01-20 | Discharge: 2019-01-20 | Disposition: A | Discharge status: Home / Self Care | Payer: BLUE CROSS/BLUE SHIELD | Source: Ambulatory Visit | Attending: Nurse Practitioner | Admitting: Nurse Practitioner

## 2019-01-20 ENCOUNTER — Encounter (HOSPITAL_COMMUNITY): Payer: Self-pay | Admitting: Nurse Practitioner

## 2019-01-20 ENCOUNTER — Other Ambulatory Visit: Payer: Self-pay

## 2019-01-20 VITALS — BP 120/72 | HR 60 | Ht 62.5 in | Wt 202.0 lb

## 2019-01-20 DIAGNOSIS — E785 Hyperlipidemia, unspecified: Secondary | ICD-10-CM | POA: Diagnosis not present

## 2019-01-20 DIAGNOSIS — Z8249 Family history of ischemic heart disease and other diseases of the circulatory system: Secondary | ICD-10-CM | POA: Diagnosis not present

## 2019-01-20 DIAGNOSIS — E039 Hypothyroidism, unspecified: Secondary | ICD-10-CM | POA: Insufficient documentation

## 2019-01-20 DIAGNOSIS — Z79899 Other long term (current) drug therapy: Secondary | ICD-10-CM | POA: Diagnosis not present

## 2019-01-20 DIAGNOSIS — K7689 Other specified diseases of liver: Secondary | ICD-10-CM | POA: Insufficient documentation

## 2019-01-20 DIAGNOSIS — Z833 Family history of diabetes mellitus: Secondary | ICD-10-CM | POA: Diagnosis not present

## 2019-01-20 DIAGNOSIS — Z8673 Personal history of transient ischemic attack (TIA), and cerebral infarction without residual deficits: Secondary | ICD-10-CM | POA: Insufficient documentation

## 2019-01-20 DIAGNOSIS — I480 Paroxysmal atrial fibrillation: Secondary | ICD-10-CM | POA: Diagnosis present

## 2019-01-20 DIAGNOSIS — R7303 Prediabetes: Secondary | ICD-10-CM | POA: Diagnosis not present

## 2019-01-20 DIAGNOSIS — E559 Vitamin D deficiency, unspecified: Secondary | ICD-10-CM | POA: Insufficient documentation

## 2019-01-20 DIAGNOSIS — I10 Essential (primary) hypertension: Secondary | ICD-10-CM | POA: Insufficient documentation

## 2019-01-20 DIAGNOSIS — Z79890 Hormone replacement therapy (postmenopausal): Secondary | ICD-10-CM | POA: Diagnosis not present

## 2019-01-20 DIAGNOSIS — Z87891 Personal history of nicotine dependence: Secondary | ICD-10-CM | POA: Diagnosis not present

## 2019-01-20 DIAGNOSIS — Z85828 Personal history of other malignant neoplasm of skin: Secondary | ICD-10-CM | POA: Insufficient documentation

## 2019-01-20 DIAGNOSIS — I2510 Atherosclerotic heart disease of native coronary artery without angina pectoris: Secondary | ICD-10-CM | POA: Diagnosis not present

## 2019-01-20 DIAGNOSIS — Z7901 Long term (current) use of anticoagulants: Secondary | ICD-10-CM | POA: Diagnosis not present

## 2019-01-20 DIAGNOSIS — I252 Old myocardial infarction: Secondary | ICD-10-CM | POA: Insufficient documentation

## 2019-01-20 MED ORDER — DILTIAZEM HCL ER COATED BEADS 120 MG PO CP24: 120.0000 mg | ORAL_CAPSULE | Freq: Every day | ORAL | 2 refills | Status: DC | PRN

## 2019-01-20 NOTE — Progress Notes (Signed)
Primary Care Physician: [name], MD Referring Physician: Dr. Royetta Crochet is a 63 y.o. female with a h/o afib,CAD, HTN, previous CVA, that was last seen for  one month f/u ablation, 09/2017.  She hadd done well until she developed afib in the setting of steriod shot and taper for an injured foot. Unfortunately, this caused her BP to elevate and then onset of afib. She saw Dr. Rayann Heman in June and placed on daily Cardizem. She has hd no further afib.     Today, she denies symptoms of palpitations, chest pain, shortness of breath, orthopnea, PND, lower extremity edema, dizziness, presyncope, syncope, or neurologic sequela. The patient is tolerating medications without difficulties and is otherwise without complaint today.   Past Medical History:  Diagnosis Date  . Allergic rhinitis, cause unspecified   . Allergy   . CAD (coronary artery disease)    LHC 12/20/2014 showed small to moderate apical infarction due to distal LAD stenosis with reestablished TIMI2 flow. No PCI. Recommended plavix for 1 yr and ASA for life.   . Chicken pox   . Chronic bronchitis ([place])   . Family history of breast cancer in first degree relative    sister age 42  . Heart murmur   . Hyperlipidemia   . Hypertension   . Hypothyroidism   . Malignant neoplasm skin of face 02/16/2007   Basal Cell Carcinoma  [place].  . Measles   . Migraine <1982   "went away when I got pregnant" (09/25/2017)  . Myocardial infarction ([place]) 01/2015  . Obesity, unspecified   . Other chronic nonalcoholic liver disease   . Pre-diabetes   . Sleep related leg cramps   . 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 Thyroid nodule    10-2016   . Unspecified vitamin D deficiency   . Vision abnormalities    Past Surgical History:  Procedure Laterality Date  . ATRIAL FIBRILLATION ABLATION  09/25/2017  . ATRIAL FIBRILLATION ABLATION N/A 09/25/2017   Procedure: ATRIAL FIBRILLATION  ABLATION;  Surgeon: Thompson Grayer, MD;  Location: [place] [facility];  Service: Cardiovascular;  Laterality: N/A;  . CARDIAC CATHETERIZATION N/A 12/20/2014   Procedure: Left Heart Cath and Coronary Angiography;  Surgeon: Sanda Klein, MD;  Location: [place] [facility];  Service: Cardiovascular;  Laterality: N/A;  . COLONOSCOPY  06/25/2007   [place]. Normal. Repeat in ten years.  . CYSTOSTOMY W/ BLADDER DILATION  1960s   "bladder stem stretched"  . LAPAROSCOPIC CHOLECYSTECTOMY    . LAPAROSCOPIC GASTRIC BANDING  07/03/10   Dr. Sherrin Daisy; [place]  . LOOP RECORDER INSERTION N/A 08/23/2016   Procedure: Loop Recorder Insertion;  Surgeon: Deboraha Sprang, MD;  Location: [place] [facility];  Service: Cardiovascular;  Laterality: N/A;  . SQUAMOUS CELL CARCINOMA EXCISION Left    neck  . THYROIDECTOMY N/A 12/17/2016   Procedure: TOTAL THYROIDECTOMY;  Surgeon: Excell Seltzer, MD;  [facility];  Service: General;  Laterality: N/A;  . TONSILLECTOMY      Current Outpatient Medications  Medication Sig Dispense Refill  . atorvastatin (LIPITOR) 80 MG tablet Take 80 mg by mouth daily.    Marland Kitchen azelastine (ASTELIN) 0.1 % nasal spray Place 2 sprays into both nostrils as needed for rhinitis. Use in each nostril as directed    . B COMPLEX-C-FOLIC ACID PO Take 1 tablet by mouth daily.    . cholecalciferol (VITAMIN D) 400 units  TABS tablet Take 2,000 Units by mouth daily.     . CONTOUR NEXT TEST test strip AS DIRECTED 100 each 4  . diltiazem (CARDIZEM CD) 120 MG 24 hr capsule Take 1 capsule (120 mg total) by mouth daily as needed. 30 capsule 2  . ELIQUIS 5 MG TABS tablet TAKE 1 TABLET BY MOUTH TWICE A DAY 180 tablet 2  . fluticasone (FLONASE) 50 MCG/ACT nasal spray Place 2 sprays into both nostrils daily as needed for allergies. 48 g 3  . furosemide (LASIX) 20 MG tablet Take 20 mg by mouth as needed.    Marland Kitchen levothyroxine (SYNTHROID, LEVOTHROID) 125 MCG tablet Take 1 tablet (125 mcg total) by mouth daily before  breakfast. BEFORE BREAKFAST WITH A FULL GLASS OF WATER. 90 tablet 3  . montelukast (SINGULAIR) 10 MG tablet Take 10 mg by mouth daily.    . potassium chloride SA (K-DUR,KLOR-CON) 20 MEQ tablet Take 20 mEq by mouth as needed. Take with lasix.    Marland Kitchen diltiazem (CARDIZEM) 30 MG tablet TAKE 1 TABLET EVERY 4 HOURS AS NEEDED FOR RAPID AFIB HEART RATE >100 (Patient not taking: Reported on 01/20/2019) 45 tablet 2   No current facility-administered medications for this encounter.     Allergies  Allergen Reactions  . Sulfa Antibiotics Nausea And Vomiting  . Zofran [Ondansetron Hcl] Other (See Comments)    Oral numbness with IV dosing.     Social History   Socioeconomic History  . Marital status: Married    Spouse name: Not on file  . Number of children: 1  . Years of education: Not on file  . Highest education level: Not on file  Occupational History  . Occupation: HAIR STYLIST    Employer: [employer]  . Financial resource strain: Not on file  . Food insecurity    Worry: Not on file    Inability: Not on file  . Transportation needs    Medical: Not on file    Non-medical: Not on file  Tobacco Use  . Smoking status: Former Smoker    Packs/day: 1.00    Years: 25.00    Pack years: 25.00    Types: Cigarettes    Quit date: 03/26/1995    Years since quitting: 23.8  . Smokeless tobacco: Never Used  Substance and Sexual Activity  . Alcohol use: No  . Drug use: No  . Sexual activity: Not Currently    Birth control/protection: Post-menopausal  Lifestyle  . Physical activity    Days per week: Not on file    Minutes per session: Not on file  . Stress: Not on file  Relationships  . Social Herbalist on phone: Not on file    Gets together: Not on file    Attends religious service: Not on file    Active member of club or organization: Not on file    Attends meetings of clubs or organizations: Not on file    Relationship status: Not on file  . Intimate partner  violence    Fear of current or ex partner: Not on file    Emotionally abused: Not on file    Physically abused: Not on file    Forced sexual activity: Not on file  Other Topics Concern  . Not on file  Social History Narrative   Marital status:  Married; x 27 years. Second marriage. 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      Tobacco:  None      Alcohol: rare/minimal.      Drugs:  None       Exercise:        Seatbelt:  100%.       Sunscreen: SPF 15.      Guns:  Maybe?    Family History  Problem Relation Age of Onset  . Diabetes Mother   . Heart disease Mother        defibrillator; carotid artery stenosis.  . Hypertension Mother   . Hyperlipidemia Mother   . Dementia Mother   . Congestive Heart Failure Mother   . Heart disease Father   . Kidney failure Father        ESRD/peritoneal dialysis  . Diabetes Father   . Hypertension Father   . Hyperlipidemia Father   . Stroke Father   . Kidney disease Father   . Congestive Heart Failure Father   . Diabetes Sister   . Hypertension Sister   . Kidney disease Sister        renal failure  . Diabetes Brother   . Hypertension Brother   . Hyperlipidemia Brother   . Arthritis Brother   . Cervical cancer Sister   . Hypertension Sister   . Liver cancer Sister 11  . Cancer Sister        Liver cancer  . Stroke Maternal Grandmother   . Heart disease Maternal Grandfather   . Stroke Paternal Grandmother   . Heart disease Paternal Grandmother   . Heart disease Paternal Grandfather   . Stroke Paternal Grandfather   . Breast cancer Sister 52  . Hepatitis C Sister 93  . Thyroid disease Neg Hx     ROS- All systems are reviewed and negative except as per the HPI above  Physical Exam: Vitals:   01/20/19 1444  BP: 120/72  Pulse: 60  Weight: 91.6 kg  Height: 5' 2.5" (1.588 m)    Wt Readings from Last 3 Encounters:  01/20/19 91.6 kg  12/18/18 92.5 kg  04/06/18 91.5 kg    Labs: Lab Results  Component Value Date   NA 142 12/22/2017   K 4.7 12/22/2017   CL 103 12/22/2017   CO2 24 12/22/2017   GLUCOSE 130 (H) 12/22/2017   BUN 20 12/22/2017   CREATININE 1.00 12/22/2017   CALCIUM 9.4 12/22/2017   Lab Results  Component Value Date   INR 0.97 08/20/2016   Lab Results  Component Value Date   CHOL 147 12/22/2017   HDL 60 12/22/2017   LDLCALC 71 12/22/2017   TRIG 80 12/22/2017     GEN- The patient is well appearing, alert and oriented x 3 today.   Head- normocephalic, atraumatic Eyes-  Sclera clear, conjunctiva pink Ears- hearing intact Oropharynx- clear Neck- supple, no JVP Lymph- no cervical lymphadenopathy Lungs- Clear to ausculation bilaterally, normal work of breathing Heart- Regular rate and rhythm, no murmurs, rubs or gallops, PMI not laterally displaced GI- soft, NT, ND, + BS Extremities- no clubbing, cyanosis, or edema MS- no significant deformity or atrophy Skin- no rash or lesion Psych- euthymic mood, full affect Neuro- strength and sensation are intact  EKG-Sinus rhythm with sinus arrhythmia, pr int 164 ms, qrs int 82 ms, qtc 441 ms Epic records reviewed   Assessment and Plan:  1. Paroxysmal afib  S/p ablation and  was staying in SR except for recent onset of afib in the setting of steroids Daily diltiazem was added and now can be stopped as she has not  had any further issues  Continue Eliquis 5 mg bid for chadsvasc score of at least 6  2. HTN Stabe  3. CAD  No anginal c/o's  F/u with Dr. Rayann Heman  8/26  Geroge Baseman. Jazmaine Fuelling, [facility] [address] [place], [place] [address] [phone]

## 2019-02-01 ENCOUNTER — Ambulatory Visit (INDEPENDENT_AMBULATORY_CARE_PROVIDER_SITE_OTHER): Payer: BLUE CROSS/BLUE SHIELD | Admitting: *Deleted

## 2019-02-01 DIAGNOSIS — I639 Cerebral infarction, unspecified: Secondary | ICD-10-CM | POA: Diagnosis not present

## 2019-02-01 LAB — CUP PACEART REMOTE DEVICE CHECK
Date Time Interrogation Session: 20200810220413
Implantable Pulse Generator Implant Date: 20180302

## 2019-02-09 NOTE — Progress Notes (Signed)
Carelink Summary Report / Loop Recorder 

## 2019-02-17 ENCOUNTER — Telehealth (INDEPENDENT_AMBULATORY_CARE_PROVIDER_SITE_OTHER): Payer: BLUE CROSS/BLUE SHIELD | Admitting: Internal Medicine

## 2019-02-17 VITALS — BP 161/93 | HR 68 | Ht 62.5 in | Wt 202.0 lb

## 2019-02-17 DIAGNOSIS — I10 Essential (primary) hypertension: Secondary | ICD-10-CM

## 2019-02-17 DIAGNOSIS — I2510 Atherosclerotic heart disease of native coronary artery without angina pectoris: Secondary | ICD-10-CM

## 2019-02-17 DIAGNOSIS — I4819 Other persistent atrial fibrillation: Secondary | ICD-10-CM

## 2019-02-17 MED ORDER — LOSARTAN POTASSIUM 25 MG PO TABS: 25.0000 mg | ORAL_TABLET | Freq: Every day | ORAL | 3 refills | Status: DC

## 2019-02-17 NOTE — Progress Notes (Signed)
Electrophysiology TeleHealth Note   Due to national recommendations of social distancing due to COVID 19, an audio/video telehealth visit is felt to be most appropriate for this patient at this time.  See MyChart message from today for the patient's consent to telehealth for [facility].    Date:  02/17/2019   ID:  Elaine Jackson, DOB May 30, 1956, MRN [id number]  Location: patient's home  Provider location:  [facility]  Evaluation Performed: Follow-up visit  PCP:  [name], MD   Electrophysiologist:  Dr Rayann Heman  Chief Complaint:  palpitations  History of Present Illness:    Elaine Jackson is a 63 y.o. female who presents via telehealth conferencing today.  Since last being seen in our clinic, the patient reports doing very well.   Her afib has resolved since her steroid injection wore off. No further issues.  Today, she denies symptoms of palpitations, chest pain, shortness of breath,  lower extremity edema, dizziness, presyncope, or syncope.  The patient is otherwise without complaint today.  The patient denies symptoms of fevers, chills, cough, or new SOB worrisome for COVID 19.  Past Medical History:  Diagnosis Date   Allergic rhinitis, cause unspecified    Allergy    CAD (coronary artery disease)    LHC 12/20/2014 showed small to moderate apical infarction due to distal LAD stenosis with reestablished TIMI2 flow. No PCI. Recommended plavix for 1 yr and ASA for life.    Chicken pox    Chronic bronchitis (HCC)    Family history of breast cancer in first degree relative    sister age 14   Heart murmur    Hyperlipidemia    Hypertension    Hypothyroidism    Malignant neoplasm skin of face 02/16/2007   Basal Cell Carcinoma  Tafeen.   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; denies residual on 09/25/2017   Thyroid nodule    10-2016    Unspecified vitamin D deficiency    Vision abnormalities     Past Surgical History:  Procedure Laterality Date   ATRIAL FIBRILLATION ABLATION  09/25/2017   ATRIAL FIBRILLATION ABLATION N/A 09/25/2017   Procedure: ATRIAL FIBRILLATION ABLATION;  Surgeon: Thompson Grayer, MD;  Location: [place] [facility];  Service: Cardiovascular;  Laterality: N/A;   CARDIAC CATHETERIZATION N/A 12/20/2014   Procedure: Left Heart Cath and Coronary Angiography;  Surgeon: Sanda Klein, MD;  Location: [place] [facility];  Service: Cardiovascular;  Laterality: N/A;   COLONOSCOPY  06/25/2007   Hung. Normal. Repeat in ten years.   CYSTOSTOMY W/ BLADDER DILATION  1960s   "bladder stem stretched"   LAPAROSCOPIC CHOLECYSTECTOMY     LAPAROSCOPIC GASTRIC BANDING  07/03/10   Dr. Sherrin Daisy; [place]   LOOP RECORDER INSERTION N/A 08/23/2016   Procedure: Loop Recorder Insertion;  Surgeon: Deboraha Sprang, MD;  Location: [place] [facility];  Service: Cardiovascular;  Laterality: N/A;   SQUAMOUS CELL CARCINOMA EXCISION Left    neck   THYROIDECTOMY N/A 12/17/2016   Procedure: TOTAL THYROIDECTOMY;  Surgeon: Excell Seltzer, MD;  Location: [facility];  Service: General;  Laterality: N/A;   TONSILLECTOMY      Current Outpatient Medications  Medication Sig Dispense Refill   atorvastatin (LIPITOR) 80 MG tablet Take 80 mg by mouth daily.     azelastine (ASTELIN) 0.1 % nasal spray Place 2 sprays into both nostrils as needed for rhinitis. Use in each nostril as directed     B COMPLEX-C-FOLIC ACID PO Take 1 tablet by mouth daily.     cholecalciferol (VITAMIN D) 400 units TABS tablet Take 2,000 Units by mouth daily.      CONTOUR NEXT TEST test strip AS DIRECTED 100 each 4   diltiazem (CARDIZEM CD) 120 MG 24 hr capsule Take 1 capsule (120 mg total) by mouth daily as needed. 30 capsule 2    diltiazem (CARDIZEM) 30 MG tablet TAKE 1 TABLET EVERY 4 HOURS AS NEEDED FOR RAPID AFIB HEART RATE >100 45 tablet 2   ELIQUIS 5 MG TABS tablet TAKE 1 TABLET BY MOUTH TWICE A DAY 180 tablet 2   fluticasone (FLONASE) 50 MCG/ACT nasal spray Place 2 sprays into both nostrils daily as needed for allergies. 48 g 3   furosemide (LASIX) 20 MG tablet Take 20 mg by mouth as needed.     levothyroxine (SYNTHROID, LEVOTHROID) 125 MCG tablet Take 1 tablet (125 mcg total) by mouth daily before breakfast. BEFORE BREAKFAST WITH A FULL GLASS OF WATER. 90 tablet 3   montelukast (SINGULAIR) 10 MG tablet Take 10 mg by mouth daily.     potassium chloride SA (K-DUR,KLOR-CON) 20 MEQ tablet Take 20 mEq by mouth as needed. Take with lasix.     No current facility-administered medications for this visit.     Allergies:   Sulfa antibiotics and Zofran [ondansetron hcl]   Social History:  The patient  reports that she quit smoking about 23 years ago. Her smoking use included cigarettes. She has a 25.00 pack-year smoking history. She has never used smokeless tobacco. She reports that she does not drink alcohol or use drugs.   Family History:  The patient's family history includes Arthritis in her brother; Breast cancer (age of onset: 53) in her sister; Cancer in her sister; Cervical cancer in her sister; Congestive Heart Failure in her father and mother; Dementia in her mother; Diabetes in her brother, father, mother, and sister; Heart disease in her father, maternal grandfather, mother, paternal grandfather, and paternal grandmother; Hepatitis C (age of onset: 72) in her sister; Hyperlipidemia in her brother, father, and mother; Hypertension in her brother, father, mother, sister, and sister; Kidney disease in her father and sister; Kidney failure in her father; Liver cancer (age of onset: 26) in her sister; Stroke in her father, maternal grandmother, paternal grandfather, and paternal grandmother.   ROS:  Please see  the history of present illness.   All other systems are personally reviewed and negative.    Exam:    Vital Signs:  BP (!) 161/93    Pulse 68    Ht 5' 2.5" (1.588 m)    Wt 202 lb (91.6 kg)    BMI 36.36 kg/m   Well sounding and appearing, alert and conversant, regular work of breathing,  good skin color Eyes- anicteric, neuro- grossly intact, skin- no apparent rash or lesions or cyanosis, mouth- oral mucosa is pink  Labs/Other Tests and Data Reviewed:    Recent Labs: No results found for requested labs within last 8760 hours.   Wt Readings from Last 3 Encounters:  02/17/19 202 lb (91.6 kg)  01/20/19 202 lb (91.6 kg)  12/18/18 204 lb (92.5 kg)  ASSESSMENT & PLAN:    1.  Paroxysmal atrial fibrillation Doing well at this time After doing well for years,  She did have AF in June following a steroid injection.  This has resolved. afib is controlled On eliquis for chads2vasc score of 6  2. CAD No ischemic symptoms  3. HTN BP is elevated Sodium restriction discussed Regular exercise and weight loss encouraged I have advised losartan 25mg  daily.  folllow-up with PCP for bmet in 4-6 weeks  Follow-up:  AF clinic in 6 months   Patient Risk:  after full review of this patients clinical status, I feel that they are at moderate risk at this time.  Today, I have spent 15 minutes with the patient with telehealth technology discussing arrhythmia management .    Army Fossa, MD  02/17/2019 2:33 PM     [place] [address] [phone] (office) [phone] (fax)

## 2019-03-08 ENCOUNTER — Ambulatory Visit (INDEPENDENT_AMBULATORY_CARE_PROVIDER_SITE_OTHER): Payer: BLUE CROSS/BLUE SHIELD | Admitting: *Deleted

## 2019-03-08 DIAGNOSIS — I639 Cerebral infarction, unspecified: Secondary | ICD-10-CM

## 2019-03-08 DIAGNOSIS — I480 Paroxysmal atrial fibrillation: Secondary | ICD-10-CM

## 2019-03-08 LAB — CUP PACEART REMOTE DEVICE CHECK
Date Time Interrogation Session: 20200912233725
Implantable Pulse Generator Implant Date: 20180302

## 2019-03-09 ENCOUNTER — Other Ambulatory Visit: Payer: Self-pay | Admitting: Physician Assistant

## 2019-03-11 ENCOUNTER — Other Ambulatory Visit: Payer: Self-pay | Admitting: Internal Medicine

## 2019-03-22 NOTE — Progress Notes (Signed)
Carelink Summary Report / Loop Recorder 

## 2019-04-08 ENCOUNTER — Ambulatory Visit (INDEPENDENT_AMBULATORY_CARE_PROVIDER_SITE_OTHER): Payer: BLUE CROSS/BLUE SHIELD | Admitting: *Deleted

## 2019-04-08 DIAGNOSIS — I480 Paroxysmal atrial fibrillation: Secondary | ICD-10-CM | POA: Diagnosis not present

## 2019-04-08 DIAGNOSIS — I639 Cerebral infarction, unspecified: Secondary | ICD-10-CM

## 2019-04-10 LAB — CUP PACEART REMOTE DEVICE CHECK
Date Time Interrogation Session: 20201015233911
Implantable Pulse Generator Implant Date: 20180302

## 2019-04-18 ENCOUNTER — Other Ambulatory Visit (HOSPITAL_COMMUNITY): Payer: Self-pay | Admitting: Nurse Practitioner

## 2019-04-22 NOTE — Progress Notes (Signed)
Carelink Summary Report / Loop Recorder 

## 2019-05-05 ENCOUNTER — Other Ambulatory Visit: Payer: Self-pay | Admitting: Nurse Practitioner

## 2019-05-11 ENCOUNTER — Ambulatory Visit (INDEPENDENT_AMBULATORY_CARE_PROVIDER_SITE_OTHER): Payer: BLUE CROSS/BLUE SHIELD | Admitting: *Deleted

## 2019-05-11 DIAGNOSIS — I480 Paroxysmal atrial fibrillation: Secondary | ICD-10-CM

## 2019-05-11 DIAGNOSIS — I639 Cerebral infarction, unspecified: Secondary | ICD-10-CM

## 2019-05-12 LAB — CUP PACEART REMOTE DEVICE CHECK
Date Time Interrogation Session: 20201117233838
Implantable Pulse Generator Implant Date: 20180302

## 2019-05-15 ENCOUNTER — Other Ambulatory Visit: Payer: Self-pay | Admitting: Nurse Practitioner

## 2019-06-08 NOTE — Progress Notes (Signed)
Carelink Summary Report / Loop Recorder 

## 2019-08-16 ENCOUNTER — Ambulatory Visit (HOSPITAL_COMMUNITY): Payer: Self-pay | Admitting: Nurse Practitioner

## 2019-08-17 ENCOUNTER — Ambulatory Visit (INDEPENDENT_AMBULATORY_CARE_PROVIDER_SITE_OTHER): Payer: Self-pay | Admitting: *Deleted

## 2019-08-17 DIAGNOSIS — I480 Paroxysmal atrial fibrillation: Secondary | ICD-10-CM

## 2019-08-17 LAB — CUP PACEART REMOTE DEVICE CHECK
Date Time Interrogation Session: 20210222191528
Implantable Pulse Generator Implant Date: 20180302

## 2019-08-18 ENCOUNTER — Encounter (HOSPITAL_COMMUNITY): Payer: Self-pay | Admitting: Nurse Practitioner

## 2019-08-18 ENCOUNTER — Ambulatory Visit (HOSPITAL_COMMUNITY)
Admission: RE | Admit: 2019-08-18 | Discharge: 2019-08-18 | Disposition: A | Discharge status: Home / Self Care | Payer: 59 | Source: Ambulatory Visit | Attending: Nurse Practitioner | Admitting: Nurse Practitioner

## 2019-08-18 ENCOUNTER — Other Ambulatory Visit: Payer: Self-pay

## 2019-08-18 VITALS — BP 116/72 | HR 79 | Ht 62.5 in | Wt 200.0 lb

## 2019-08-18 DIAGNOSIS — I10 Essential (primary) hypertension: Secondary | ICD-10-CM | POA: Insufficient documentation

## 2019-08-18 DIAGNOSIS — Z800 Family history of malignant neoplasm of digestive organs: Secondary | ICD-10-CM | POA: Insufficient documentation

## 2019-08-18 DIAGNOSIS — Z803 Family history of malignant neoplasm of breast: Secondary | ICD-10-CM | POA: Insufficient documentation

## 2019-08-18 DIAGNOSIS — Z8049 Family history of malignant neoplasm of other genital organs: Secondary | ICD-10-CM | POA: Diagnosis not present

## 2019-08-18 DIAGNOSIS — Z79890 Hormone replacement therapy (postmenopausal): Secondary | ICD-10-CM | POA: Insufficient documentation

## 2019-08-18 DIAGNOSIS — Z841 Family history of disorders of kidney and ureter: Secondary | ICD-10-CM | POA: Insufficient documentation

## 2019-08-18 DIAGNOSIS — Z833 Family history of diabetes mellitus: Secondary | ICD-10-CM | POA: Insufficient documentation

## 2019-08-18 DIAGNOSIS — D6869 Other thrombophilia: Secondary | ICD-10-CM

## 2019-08-18 DIAGNOSIS — E669 Obesity, unspecified: Secondary | ICD-10-CM | POA: Diagnosis not present

## 2019-08-18 DIAGNOSIS — I480 Paroxysmal atrial fibrillation: Secondary | ICD-10-CM

## 2019-08-18 DIAGNOSIS — E785 Hyperlipidemia, unspecified: Secondary | ICD-10-CM | POA: Diagnosis not present

## 2019-08-18 DIAGNOSIS — Z823 Family history of stroke: Secondary | ICD-10-CM | POA: Insufficient documentation

## 2019-08-18 DIAGNOSIS — E559 Vitamin D deficiency, unspecified: Secondary | ICD-10-CM | POA: Diagnosis not present

## 2019-08-18 DIAGNOSIS — I4891 Unspecified atrial fibrillation: Secondary | ICD-10-CM | POA: Diagnosis present

## 2019-08-18 DIAGNOSIS — Z8673 Personal history of transient ischemic attack (TIA), and cerebral infarction without residual deficits: Secondary | ICD-10-CM | POA: Diagnosis not present

## 2019-08-18 DIAGNOSIS — E039 Hypothyroidism, unspecified: Secondary | ICD-10-CM | POA: Insufficient documentation

## 2019-08-18 DIAGNOSIS — Z85828 Personal history of other malignant neoplasm of skin: Secondary | ICD-10-CM | POA: Diagnosis not present

## 2019-08-18 DIAGNOSIS — Z882 Allergy status to sulfonamides status: Secondary | ICD-10-CM | POA: Insufficient documentation

## 2019-08-18 DIAGNOSIS — Z79899 Other long term (current) drug therapy: Secondary | ICD-10-CM | POA: Insufficient documentation

## 2019-08-18 DIAGNOSIS — I252 Old myocardial infarction: Secondary | ICD-10-CM | POA: Insufficient documentation

## 2019-08-18 DIAGNOSIS — I2510 Atherosclerotic heart disease of native coronary artery without angina pectoris: Secondary | ICD-10-CM | POA: Diagnosis not present

## 2019-08-18 DIAGNOSIS — Z8249 Family history of ischemic heart disease and other diseases of the circulatory system: Secondary | ICD-10-CM | POA: Insufficient documentation

## 2019-08-18 DIAGNOSIS — Z7901 Long term (current) use of anticoagulants: Secondary | ICD-10-CM | POA: Insufficient documentation

## 2019-08-18 DIAGNOSIS — Z6835 Body mass index (BMI) 35.0-35.9, adult: Secondary | ICD-10-CM | POA: Diagnosis not present

## 2019-08-18 DIAGNOSIS — Z87891 Personal history of nicotine dependence: Secondary | ICD-10-CM | POA: Insufficient documentation

## 2019-08-18 DIAGNOSIS — Z880 Allergy status to penicillin: Secondary | ICD-10-CM | POA: Insufficient documentation

## 2019-08-18 NOTE — Progress Notes (Addendum)
Primary Care Physician: [name], MD Referring Physician: Dr. Royetta Crochet is a 64 y.o. female with a h/o afib,CAD, HTN, previous CVA, s/p ablation, 09/2017.  She had  done well until she developed afib in the setting of steriod shot and taper for an injured foot. Unfortunately, this caused her BP to elevate and then onset of afib. She saw Dr. Rayann Heman  and placed on daily Cardizem.     F/u in afib clinic 08/18/19. She feels well. No afib to report.no issues with eliquis with a CHA2DS2VASc score of 6.  Today, she denies symptoms of palpitations, chest pain, shortness of breath, orthopnea, PND, lower extremity edema, dizziness, presyncope, syncope, or neurologic sequela. The patient is tolerating medications without difficulties and is otherwise without complaint today.   Past Medical History:  Diagnosis Date  . Allergic rhinitis, cause unspecified   . Allergy   . CAD (coronary artery disease)    LHC 12/20/2014 showed small to moderate apical infarction due to distal LAD stenosis with reestablished TIMI2 flow. No PCI. Recommended plavix for 1 yr and ASA for life.   . Chicken pox   . Chronic bronchitis ([place])   . Family history of breast cancer in first degree relative    sister age 83  . Heart murmur   . Hyperlipidemia   . Hypertension   . Hypothyroidism   . Malignant neoplasm skin of face 02/16/2007   Basal Cell Carcinoma  [place]afe[place].  . Measles   . Migraine <1982   "went away when I got pregnant" (09/25/2017)  . Myocardial infarction ([place]) 01/2015  . Obesity, unspecified   . Other chronic nonalcoholic liver disease   . Pre-diabetes   . Sleep related leg cramps   . 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 Thyroid nodule    10-2016   . Unspecified vitamin D deficiency   . Vision abnormalities    Past Surgical History:  Procedure Laterality Date  . ATRIAL FIBRILLATION ABLATION  09/25/2017  . ATRIAL FIBRILLATION ABLATION  N/A 09/25/2017   Procedure: ATRIAL FIBRILLATION ABLATION;  Surgeon: Thompson Grayer, MD;  Location: [place] [facility];  Service: Cardiovascular;  Laterality: N/A;  . CARDIAC CATHETERIZATION N/A 12/20/2014   Procedure: Left Heart Cath and Coronary Angiography;  Surgeon: Sanda Klein, MD;  Location: [place] [facility];  Service: Cardiovascular;  Laterality: N/A;  . COLONOSCOPY  06/25/2007   Hung. Normal. Repeat in ten years.  . CYSTOSTOMY W/ BLADDER DILATION  1960s   "bladder stem stretched"  . LAPAROSCOPIC CHOLECYSTECTOMY    . LAPAROSCOPIC GASTRIC BANDING  07/03/10   Dr. Sherrin Daisy; [place]  . LOOP RECORDER INSERTION N/A 08/23/2016   Procedure: Loop Recorder Insertion;  Surgeon: Deboraha Sprang, MD;  Location: [place] [facility];  Service: Cardiovascular;  Laterality: N/A;  . SQUAMOUS CELL CARCINOMA EXCISION Left    neck  . THYROIDECTOMY N/A 12/17/2016   Procedure: TOTAL THYROIDECTOMY;  Surgeon: Excell Seltzer, MD;  [facility];  Service: General;  Laterality: N/A;  . TONSILLECTOMY      Current Outpatient Medications  Medication Sig Dispense Refill  . atorvastatin (LIPITOR) 80 MG tablet Take 80 mg by mouth daily.    Marland Kitchen azelastine (ASTELIN) 0.1 % nasal spray Place 2 sprays into both nostrils as needed for rhinitis. Use in each nostril as directed    . B COMPLEX-C-FOLIC ACID PO Take 1 tablet by mouth daily.    Marland Kitchen  cholecalciferol (VITAMIN D) 400 units TABS tablet Take 2,000 Units by mouth daily.     . CONTOUR NEXT TEST test strip AS DIRECTED 100 each 4  . diltiazem (CARDIZEM CD) 120 MG 24 hr capsule TAKE 1 CAPSULE (120 MG TOTAL) BY MOUTH DAILY AS NEEDED (FOR BREAKTHROUGH AFIB). 90 capsule 1  . diltiazem (CARDIZEM) 30 MG tablet TAKE 1 TABLET EVERY 4 HOURS AS NEEDED FOR RAPID AFIB HEART RATE >100 45 tablet 2  . ELIQUIS 5 MG TABS tablet TAKE 1 TABLET BY MOUTH TWICE A DAY 180 tablet 2  . fluticasone (FLONASE) 50 MCG/ACT nasal spray Place 2 sprays into both nostrils daily as needed for  allergies. 48 g 3  . furosemide (LASIX) 20 MG tablet Take 20 mg by mouth as needed.    Marland Kitchen levothyroxine (SYNTHROID, LEVOTHROID) 125 MCG tablet Take 1 tablet (125 mcg total) by mouth daily before breakfast. BEFORE BREAKFAST WITH A FULL GLASS OF WATER. 90 tablet 3  . losartan (COZAAR) 25 MG tablet Take 1 tablet (25 mg total) by mouth daily. 90 tablet 3  . montelukast (SINGULAIR) 10 MG tablet Take 10 mg by mouth daily.    . potassium chloride SA (K-DUR,KLOR-CON) 20 MEQ tablet Take 20 mEq by mouth as needed. Take with lasix.    Marland Kitchen topiramate (TOPAMAX) 25 MG tablet Take 25 mg by mouth daily.     No current facility-administered medications for this encounter.    Allergies  Allergen Reactions  . Sulfa Antibiotics Nausea And Vomiting  . Zofran [Ondansetron Hcl] Other (See Comments)    Oral numbness with IV dosing.     Social History   Socioeconomic History  . Marital status: Married    Spouse name: Not on file  . Number of children: 1  . Years of education: Not on file  . Highest education level: Not on file  Occupational History  . Occupation: HAIR STYLIST    Employer: [employer]  Tobacco Use  . Smoking status: Former Smoker    Packs/day: 1.00    Years: 25.00    Pack years: 25.00    Types: Cigarettes    Quit date: 03/26/1995    Years since quitting: 24.4  . Smokeless tobacco: Never Used  Substance and Sexual Activity  . Alcohol use: No  . Drug use: No  . Sexual activity: Not Currently    Birth control/protection: Post-menopausal  Other Topics Concern  . Not on file  Social History Narrative   Marital status:  Married; x 27 years. Second marriage. 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      Tobacco:  None      Alcohol: rare/minimal.      Drugs:  None       Exercise:        Seatbelt:  100%.       Sunscreen: SPF 15.        Guns:  Maybe?   Social Determinants of Health   Financial Resource Strain:   . Difficulty of Paying Living Expenses: Not on file  Food Insecurity:   . Worried About Charity fundraiser in the Last Year: Not on file  . Ran Out of Food in the Last Year: Not on file  Transportation Needs:   . Lack of Transportation (Medical): Not  on file  . Lack of Transportation (Non-Medical): Not on file  Physical Activity:   . Days of Exercise per Week: Not on file  . Minutes of Exercise per Session: Not on file  Stress:   . Feeling of Stress : Not on file  Social Connections:   . Frequency of Communication with Friends and Family: Not on file  . Frequency of Social Gatherings with Friends and Family: Not on file  . Attends Religious Services: Not on file  . Active Member of Clubs or Organizations: Not on file  . Attends Archivist Meetings: Not on file  . Marital Status: Not on file  Intimate Partner Violence:   . Fear of Current or Ex-Partner: Not on file  . Emotionally Abused: Not on file  . Physically Abused: Not on file  . Sexually Abused: Not on file    Family History  Problem Relation Age of Onset  . Diabetes Mother   . Heart disease Mother        defibrillator; carotid artery stenosis.  . Hypertension Mother   . Hyperlipidemia Mother   . Dementia Mother   . Congestive Heart Failure Mother   . Heart disease Father   . Kidney failure Father        ESRD/peritoneal dialysis  . Diabetes Father   . Hypertension Father   . Hyperlipidemia Father   . Stroke Father   . Kidney disease Father   . Congestive Heart Failure Father   . Diabetes Sister   . Hypertension Sister   . Kidney disease Sister        renal failure  . Diabetes Brother   . Hypertension Brother   . Hyperlipidemia Brother   . Arthritis Brother   . Cervical cancer Sister   . Hypertension Sister   . Liver cancer Sister 67  . Cancer Sister        Liver cancer  . Stroke Maternal Grandmother   .  Heart disease Maternal Grandfather   . Stroke Paternal Grandmother   . Heart disease Paternal Grandmother   . Heart disease Paternal Grandfather   . Stroke Paternal Grandfather   . Breast cancer Sister 64  . Hepatitis C Sister 12  . Thyroid disease Neg Hx     ROS- All systems are reviewed and negative except as per the HPI above  Physical Exam: Vitals:   08/18/19 0935  BP: 116/72  Pulse: 79  Weight: 90.7 kg  Height: 5' 2.5" (1.588 m)   Wt Readings from Last 3 Encounters:  08/18/19 90.7 kg  02/17/19 91.6 kg  01/20/19 91.6 kg    Labs: Lab Results  Component Value Date   NA 142 12/22/2017   K 4.7 12/22/2017   CL 103 12/22/2017   CO2 24 12/22/2017   GLUCOSE 130 (H) 12/22/2017   BUN 20 12/22/2017   CREATININE 1.00 12/22/2017   CALCIUM 9.4 12/22/2017   Lab Results  Component Value Date   INR 0.97 08/20/2016   Lab Results  Component Value Date   CHOL 147 12/22/2017   HDL 60 12/22/2017   LDLCALC 71 12/22/2017   TRIG 80 12/22/2017     GEN- The patient is well appearing, alert and oriented x 3 today.   Head- normocephalic, atraumatic Eyes-  Sclera clear, conjunctiva pink Ears- hearing intact Oropharynx- clear Neck- supple, no JVP Lymph- no cervical lymphadenopathy Lungs- Clear to ausculation bilaterally, normal work of breathing Heart- Regular rate and rhythm, no murmurs, rubs or gallops, PMI  not laterally displaced GI- soft, NT, ND, + BS Extremities- no clubbing, cyanosis, or edema MS- no significant deformity or atrophy Skin- no rash or lesion Psych- euthymic mood, full affect Neuro- strength and sensation are intact  EKG-Sinus rhythm 79 bpm, pr int 158 ms, qrs int 76 ms, qtc 431 ms Epic records reviewed   Assessment and Plan:  1. Paroxysmal afib  S/p ablation and  staying in SR    Previous daily cardizem 120 mg  is as needed as afib has been quiet Continue Eliquis 5 mg bid for chadsvasc score of at least 6  2. HTN Stabe  3. CAD  No anginal  c/o's  F/u in afib clinic in 9 months   Butch Penny C. Keldan Eplin, [facility] [address] [place], [place] [address] [phone]

## 2019-08-18 NOTE — Progress Notes (Signed)
ILR Remote 

## 2019-08-25 ENCOUNTER — Encounter: Payer: Self-pay | Admitting: *Deleted

## 2019-09-13 ENCOUNTER — Ambulatory Visit: Payer: 59 | Admitting: Physician Assistant

## 2019-09-13 ENCOUNTER — Encounter: Payer: Self-pay | Admitting: Physician Assistant

## 2019-09-13 ENCOUNTER — Other Ambulatory Visit: Payer: Self-pay

## 2019-09-13 DIAGNOSIS — D489 Neoplasm of uncertain behavior, unspecified: Secondary | ICD-10-CM

## 2019-09-13 DIAGNOSIS — C4491 Basal cell carcinoma of skin, unspecified: Secondary | ICD-10-CM | POA: Diagnosis not present

## 2019-09-13 DIAGNOSIS — Z86006 Personal history of melanoma in-situ: Secondary | ICD-10-CM | POA: Diagnosis not present

## 2019-09-13 DIAGNOSIS — D492 Neoplasm of unspecified behavior of bone, soft tissue, and skin: Secondary | ICD-10-CM

## 2019-09-13 DIAGNOSIS — Z1283 Encounter for screening for malignant neoplasm of skin: Secondary | ICD-10-CM | POA: Diagnosis not present

## 2019-09-13 DIAGNOSIS — L570 Actinic keratosis: Secondary | ICD-10-CM

## 2019-09-13 DIAGNOSIS — C44719 Basal cell carcinoma of skin of left lower limb, including hip: Secondary | ICD-10-CM | POA: Diagnosis not present

## 2019-09-13 NOTE — Progress Notes (Addendum)
Follow-Up Visit   Subjective  Elaine Jackson is a 64 y.o. female who presents for the following: Annual Exam (Patient has a history of Melanoma in Situ. Patient would like to have a spot check on right calf x unsure patient denies bleeding, no pain. Also check spot on left thigh x just noticed, patient denies bleeding or pain.) It is a dark spot which  is persistent. Pt. Unsure if it is old or new or changing   Location: right calf  Duration:3 months   Quality: pink scale Associated Signs/Symptoms:none  Modifying Factors: persistent   The following portions of the chart were reviewed this encounter and updated as appropriate: Tobacco  Allergies  Meds  Problems  Med Hx  Surg Hx  Fam Hx      Objective  Well appearing patient in no apparent distress; mood and affect are within normal limits.  A full examination was performed including scalp, head, eyes, ears, nose, lips, neck, chest, axillae, abdomen, back, buttocks, bilateral upper extremities, bilateral lower extremities, hands, feet, fingers, toes, fingernails, and toenails. All findings within normal limits unless otherwise noted below.  Objective  Left Flank, Left Lower Back, Left Lower Leg - Anterior, Left Thigh - Anterior, Left Upper Back, Mid Back, Mid Forehead, Right Forehead, Right Hand - Posterior, Right Lower Back (2), Right Lower Leg - Anterior, Right Upper Back: Erythematous patches with gritty scale.  Objective  back: Mm scar is clear. No pigment  Objective  Head to toe: Mm scar clear. No LAD  Objective  Right Lower Leg - Posterior: Pink pearly papule or plaque with arborizing vessels.  1.3 cm       Assessment & Plan  AK (actinic keratosis) (13) Right Hand - Posterior; Left Thigh - Anterior; Left Lower Leg - Anterior; Right Lower Leg - Anterior; Left Flank; Left Upper Back; Right Upper Back; Left Lower Back; Right Lower Back (2); Mid Back; Mid Forehead; Right Forehead  Destruction of lesion - Left  Flank, Left Lower Back, Left Lower Leg - Anterior, Left Thigh - Anterior, Left Upper Back, Mid Back, Mid Forehead, Right Forehead, Right Hand - Posterior, Right Lower Back (2), Right Lower Leg - Anterior, Right Upper Back Complexity: simple   Destruction method: cryotherapy   Informed consent: discussed and consent obtained   Timeout:  patient name, date of birth, surgical site, and procedure verified Lesion destroyed using liquid nitrogen: Yes   Cryotherapy cycles:  1 Outcome: patient tolerated procedure well with no complications   Post-procedure details: wound care instructions given    Personal history of melanoma in-situ back  Bi yearly skin examinations  Screening exam for skin cancer Head to toe  Observe .  Superficial basal cell carcinoma (BCC) Right Lower Leg - Posterior  Skin / nail biopsy Type of biopsy: tangential   Informed consent: discussed and consent obtained   Timeout: patient name, date of birth, surgical site, and procedure verified   Procedure prep:  Patient was prepped and draped in usual sterile fashion Prep type:  Chlorhexidine Anesthesia: the lesion was anesthetized in a standard fashion   Anesthetic:  1% lidocaine w/ epinephrine 1-100,000 local infiltration Instrument used: flexible razor blade   Hemostasis achieved with: aluminum chloride   Outcome: patient tolerated procedure well   Post-procedure details: wound care instructions given   Additional details:  PERFORMED ED&C AFTER BIOPSY- curet was used and scraped in a circular motion alternating with EC 3 times. Hemostasis was obtained with aluminum chloride. Patient tolerated the  lesion well.lesion. length1.2,cm width1.2,cm,  margin 0.22mm total 1.3 cm   Specimen 1 - Surgical pathology Differential Diagnosis: BCC Check Margins: No No atypical nevi noted at the time of the visit.

## 2019-09-13 NOTE — Patient Instructions (Signed)
Levulan/PDT Treatment Common Side Effects  - Burning/stinging, which may be severe and last up to 24-72 hours after your treatment  - Redness, swelling and/or peeling which may last up to 4 weeks  - Scaling/crusting which may last up to 4-6 weeks  - Sun sensitivity  Care Instructions  - Okay to wash with soap and water and shampoo as normal  - If needed, you can do a cold compress (ex. Ice packs) for comfort  - If okay with your Primary Doctor, you may use analgesics such as Tylenol every 4-6 hours, not to exceed recommended dose  - You may apply Cerave Healing Ointment, Vaseline or Aquaphor  - If you have a lot of swelling you may take a Benadryl to help with this  Sun Precautions  - Avoid all sun exposure for 2 days  - Wear a wide brim hat for the next week if outside  - Wear a sunblock with titanium dioxide greater than 5% daily   We will recheck you in 10-12 weeks. If any problems, please call the office and ask to speak with a nurse. Wound Care Instructions  1. Cleanse would gently with soap and water once a day then pat dry with clean gauze. Apply a thing coat of Petrolatum (petroleum jelly, "Vaseline") over the wound (unless you have an allergy to this). We recommend that you use a new, sterile tube of Vaseline. Do not pick or remove scabs. Do not remove the yellow or white "healing tissue" from the base of the wound.  2. Cover the wound with fresh, clean, nonstick gauze and secure with paper tape. You may use Band-Aids in place of gauze and tape if the would is small enough, but would recommend trimming much of the tape off as there is often too much. Sometimes Band-Aids can irritate the skin.  3. You should call the office for your biopsy report after 1 week if you have not already been contacted.  4. If you experience any problems, such as abnormal amounts of bleeding, swelling, significant bruising, significant pain, or evidence of infection, please call the office  immediately.  5. FOR ADULT SURGERY PATIENTS: If you need something for pain relief you may take 1 extra strength Tylenol (acetaminophen) AND 2 Ibuprofen (200mg  each) together every 4 hours as needed for pain. (do not take these if you are allergic to them or if you have a reason you should not take them.) Typically, you may only need pain medication for 1 to 3 days.

## 2019-09-15 ENCOUNTER — Telehealth: Payer: Self-pay | Admitting: *Deleted

## 2019-09-15 NOTE — Telephone Encounter (Signed)
Pathology results to patient.  Patient already has 6 month follow up appointment with [facility].

## 2019-09-15 NOTE — Telephone Encounter (Signed)
-----   Message from Warren Danes, [place] sent at 09/15/2019 10:21 AM EDT ----- Skin , right lower leg, posterior SUPERFICIAL BASAL CELL CARCINOMA Txpbx- 3-6 mo f/u

## 2019-09-17 ENCOUNTER — Ambulatory Visit (INDEPENDENT_AMBULATORY_CARE_PROVIDER_SITE_OTHER): Payer: 59 | Admitting: *Deleted

## 2019-09-17 DIAGNOSIS — I480 Paroxysmal atrial fibrillation: Secondary | ICD-10-CM | POA: Diagnosis not present

## 2019-09-17 LAB — CUP PACEART REMOTE DEVICE CHECK
Date Time Interrogation Session: 20210325201733
Implantable Pulse Generator Implant Date: 20180302

## 2019-09-17 NOTE — Progress Notes (Signed)
ILR Remote 

## 2019-09-21 ENCOUNTER — Other Ambulatory Visit: Payer: Self-pay | Admitting: Family Medicine

## 2019-09-21 DIAGNOSIS — Z1231 Encounter for screening mammogram for malignant neoplasm of breast: Secondary | ICD-10-CM

## 2019-10-15 ENCOUNTER — Other Ambulatory Visit: Payer: Self-pay

## 2019-10-15 ENCOUNTER — Encounter: Payer: Self-pay | Admitting: Cardiology

## 2019-10-15 ENCOUNTER — Ambulatory Visit (INDEPENDENT_AMBULATORY_CARE_PROVIDER_SITE_OTHER): Payer: 59 | Admitting: Cardiology

## 2019-10-15 VITALS — BP 126/76 | HR 74 | Ht 62.0 in | Wt 201.8 lb

## 2019-10-15 DIAGNOSIS — I2510 Atherosclerotic heart disease of native coronary artery without angina pectoris: Secondary | ICD-10-CM

## 2019-10-15 DIAGNOSIS — I480 Paroxysmal atrial fibrillation: Secondary | ICD-10-CM | POA: Diagnosis not present

## 2019-10-15 NOTE — Progress Notes (Signed)
Cardiology Office Note:    Date:  10/15/2019   ID:  Elaine Jackson, DOB 1956-04-07, MRN [id number]  PCP:  [name], MD  Cardiologist:  No primary care provider on file.  Electrophysiologist:  None   Referring MD: [name], MD     History of Present Illness:    Elaine Jackson is a 64 y.o. female here for follow-up of coronary artery disease.apical myocardial infarction on 12/20/14 due to distal LAD stenosis, no PCI with diabetes, hyperlipidemia, hypertension who had stroke February/18 with loop recorder implanted, post afib ablation 09/25/17, Dr. Rayann Heman.  Overall doing quite well.  She did have a brief episode of atrial fibrillation lasting 30 minutes felt quite poor with this has her migraine-like aura with it.  She ended up taking the diltiazem as needed that she carries around with her lay down and it went away.  The trigger was prednisone use.  Denies any chest pain.  Did not have any chest pain with her prior myocardial infarction.  No fevers chills nausea vomiting.  Past Medical History:  Diagnosis Date  . Allergic rhinitis, cause unspecified   . Allergy   . Basal cell carcinoma 10/04/1996   right chest-TX-DR.Turner  . BCC (basal cell carcinoma) 12/05/2000   upper mid back [place]  . BCC (basal cell carcinoma) 12/05/2000   Left upper back TX Dr. Ronnald Ramp  . BCC (basal cell carcinoma) 09/16/2001   left upper back TX Dr.Jones  . BCC (basal cell carcinoma) 08/15/2003   left upper mid back T Dr.Jones  . BCC (basal cell carcinoma) 08/15/2003   right upper mid back Dr. Ronnald Ramp  . BCC (basal cell carcinoma) 08/15/2003   left medial breast TX Dr.jones  . BCC (basal cell carcinoma) 10/12/2004   left anterior shin tx Dr.jones  . BCC (basal cell carcinoma) 09/14/2007   right of sternum-Tx-CX81fu  . BCC (basal cell carcinoma) 10/20/2012   left post sholder-tx CX35FU  . BCC (basal cell carcinoma) 10/20/2012   left sholder tip-TX- CX35FU  . BCC (basal cell carcinoma)  10/20/2012   left temple-TX CX35FU  . BCC (basal cell carcinoma) 10/20/2012   left outer calf-TX cx31fu  . BCC (basal cell carcinoma) 10/20/2012   left upper arm-tx cx27fu  . BCC (basal cell carcinoma) 10/20/2012   right sholder tip-tx cx66fu  . BCC (basal cell carcinoma) 11/10/2013   right upper back-cx64fu  . BCC (basal cell carcinoma) 06/07/2014   left inferior post sholder-TXpbx  . BCC (basal cell carcinoma) 06/13/2015   righ post sholder-TXpbx  . BCC (basal cell carcinoma) 06/13/2015   left upper ankle  . BCC (basal cell carcinoma) 03/18/2017   right outer back-txpbx  . BCC (basal cell carcinoma) 03/18/2017   left trapezius-txpbx  . BCC (basal cell carcinoma) 03/18/2017   left deltoid-txpbx  . BCC (basal cell carcinoma) 06/05/2017   right rhomboid-txpbx  . BCC (basal cell carcinoma) 04/28/2018   right tragus-txpbx  . BCC (basal cell carcinoma) 06/09/2018   mid upper back txpbx  . BCC (basal cell carcinoma) 03/09/2019   left deltoid inferior txpbx  . BCC (basal cell carcinoma) 03/09/2019   left chest medial txpbx  . CAD (coronary artery disease)    LHC 12/20/2014 showed small to moderate apical infarction due to distal LAD stenosis with reestablished TIMI2 flow. No PCI. Recommended plavix for 1 yr and ASA for life.   . Chicken pox   . Chronic bronchitis ([place])   . Family history of breast  cancer in first degree relative    sister age 51  . Heart murmur   . Hyperlipidemia   . Hypertension   . Hypothyroidism   . Malignant neoplasm skin of face 02/16/2007   Basal Cell Carcinoma  [place].  . Measles   . Melanoma ([place]) 12/17/2017   left forearm-MIS TX- Excision  . Migraine <1982   "went away when I got pregnant" (09/25/2017)  . Myocardial infarction ([place]) 01/2015  . Obesity, unspecified   . Other chronic nonalcoholic liver disease   . Pre-diabetes   . Skin cancer 10/12/2004   right anterior shin-CIS-TX Dr.Jones  . Skin cancer 03/10/2013   right lower shin-txpbx CIS   . Skin cancer 01/18/2015   left neck-[place] cx1 + Caut[place]  . Skin cancer 02/28/2015   left neck-excision  . Skin cancer 06/05/2017   right scapula-CIS  . Skin cancer 04/28/2018   left ear helix-well diff scc-txpbx  . Sleep related leg cramps   . Stroke (cerebrum) ([place])    due to large vessel disease; denies residual on 09/25/2017  . Thyroid nodule    10-2016   . Unspecified vitamin D deficiency   . Vision abnormalities     Past Surgical History:  Procedure Laterality Date  . ATRIAL FIBRILLATION ABLATION  09/25/2017  . ATRIAL FIBRILLATION ABLATION N/A 09/25/2017   Procedure: ATRIAL FIBRILLATION ABLATION;  Surgeon: Thompson Grayer, MD;  Location: [place];  Service: Cardiovascular;  Laterality: N/A;  . CARDIAC CATHETERIZATION N/A 12/20/2014   Procedure: Left Heart Cath and Coronary Angiography;  Surgeon: Sanda Klein, MD;  Location: [place];  Service: Cardiovascular;  Laterality: N/A;  . COLONOSCOPY  06/25/2007   [place]. Normal. Repeat in ten years.  . CYSTOSTOMY W/ BLADDER DILATION  1960s   "bladder stem stretched"  . LAPAROSCOPIC CHOLECYSTECTOMY    . LAPAROSCOPIC GASTRIC BANDING  07/03/10   Dr. Sherrin Daisy; [facility]  . LOOP RECORDER INSERTION N/A 08/23/2016   Procedure: Loop Recorder Insertion;  Surgeon: Deboraha Sprang, MD;  Location: [place] [facility];  Service: Cardiovascular;  Laterality: N/A;  . SQUAMOUS CELL CARCINOMA EXCISION Left    neck  . THYROIDECTOMY N/A 12/17/2016   Procedure: TOTAL THYROIDECTOMY;  Surgeon: Excell Seltzer, MD;  Location: [facility];  Service: General;  Laterality: N/A;  . TONSILLECTOMY      Current Medications: Current Meds  Medication Sig  . atorvastatin (LIPITOR) 80 MG tablet Take 80 mg by mouth daily.  Marland Kitchen azelastine (ASTELIN) 0.1 % nasal spray Place 2 sprays into both nostrils as needed for rhinitis. Use in each nostril as directed  . B COMPLEX-C-FOLIC ACID PO Take 1 tablet by mouth daily.  . cholecalciferol (VITAMIN D) 400 units  TABS tablet Take 2,000 Units by mouth daily.   . CONTOUR NEXT TEST test strip AS DIRECTED  . diltiazem (CARDIZEM CD) 120 MG 24 hr capsule TAKE 1 CAPSULE (120 MG TOTAL) BY MOUTH DAILY AS NEEDED (FOR BREAKTHROUGH AFIB).  Marland Kitchen diltiazem (CARDIZEM) 30 MG tablet TAKE 1 TABLET EVERY 4 HOURS AS NEEDED FOR RAPID AFIB HEART RATE >100  . ELIQUIS 5 MG TABS tablet TAKE 1 TABLET BY MOUTH TWICE A DAY  . fluticasone (FLONASE) 50 MCG/ACT nasal spray Place 2 sprays into both nostrils daily as needed for allergies.  . furosemide (LASIX) 20 MG tablet Take 20 mg by mouth as needed.  Marland Kitchen levothyroxine (SYNTHROID, LEVOTHROID) 125 MCG tablet Take 1 tablet (125 mcg total) by mouth daily before breakfast. BEFORE BREAKFAST WITH  A FULL GLASS OF WATER.  . montelukast (SINGULAIR) 10 MG tablet Take 10 mg by mouth daily.  . potassium chloride SA (K-DUR,KLOR-CON) 20 MEQ tablet Take 20 mEq by mouth as needed. Take with lasix.     Allergies:   Sulfa antibiotics and Zofran [ondansetron hcl]   Social History   Socioeconomic History  . Marital status: Married    Spouse name: Not on file  . Number of children: 1  . Years of education: Not on file  . Highest education level: Not on file  Occupational History  . Occupation: HAIR STYLIST    Employer: [employer]  Tobacco Use  . Smoking status: Former Smoker    Packs/day: 1.00    Years: 25.00    Pack years: 25.00    Types: Cigarettes    Quit date: 03/26/1995    Years since quitting: 24.5  . Smokeless tobacco: Never Used  Substance and Sexual Activity  . Alcohol use: Yes  . Drug use: No  . Sexual activity: Not Currently    Birth control/protection: Post-menopausal  Other Topics Concern  . Not on file  Social History Narrative   Marital status:  Married; x 27 years. Second marriage. 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      Tobacco:  None      Alcohol: rare/minimal.      Drugs:  None       Exercise:        Seatbelt:  100%.       Sunscreen: SPF 15.      Guns:  Maybe?   Social Determinants of Health   Financial Resource Strain:   . Difficulty of Paying Living Expenses:   Food Insecurity:   . Worried About Charity fundraiser in the Last Year:   . Arboriculturist in the Last Year:   Transportation Needs:   . Film/video editor (Medical):   Marland Kitchen Lack of Transportation (Non-Medical):   Physical Activity:   . Days of Exercise per Week:   . Minutes of Exercise per Session:   Stress:   . Feeling of Stress :   Social Connections:   . Frequency of Communication with Friends and Family:   . Frequency of Social Gatherings with Friends and Family:   . Attends Religious Services:   . Active Member of Clubs or Organizations:   . Attends Archivist Meetings:   Marland Kitchen Marital Status:      Family History: The patient's family history includes Arthritis in her brother; Breast cancer (age of onset: 57) in her sister; Cancer in her sister; Cervical cancer in her sister; Congestive Heart Failure in her father and mother; Dementia in her mother; Diabetes in her brother, father, mother, and sister; Heart disease in her father, maternal grandfather, mother, paternal grandfather, and paternal grandmother; Hepatitis C (age of onset: 37) in her sister; Hyperlipidemia in her brother, father, and mother; Hypertension in her brother, father, mother, sister, and sister; Kidney disease in her father and sister; Kidney failure in her father; Liver cancer (age of onset: 74) in her sister; Stroke in her father, maternal grandmother, paternal grandfather, and paternal grandmother. There is no history of Thyroid disease.  ROS:   Please see the history of present illness.  All other systems reviewed and are negative.  EKGs/Labs/Other Studies Reviewed:    The following studies were reviewed today: Prior lab work  office notes EP notes  EKG:  EKG is not ordered today.    Recent Labs: No results found for requested labs within last 8760 hours.  Recent Lipid Panel    Component Value Date/Time   CHOL 147 12/22/2017 0846   TRIG 80 12/22/2017 0846   HDL 60 12/22/2017 0846   CHOLHDL 2.5 12/22/2017 0846   CHOLHDL 2.4 08/21/2016 0147   VLDL 11 08/21/2016 0147   LDLCALC 71 12/22/2017 0846    Physical Exam:    VS:  BP 126/76   Pulse 74   Ht 5\' 2"  (1.575 m)   Wt 201 lb 12.8 oz (91.5 kg)   SpO2 99%   BMI 36.91 kg/m     Wt Readings from Last 3 Encounters:  10/15/19 201 lb 12.8 oz (91.5 kg)  08/18/19 200 lb (90.7 kg)  02/17/19 202 lb (91.6 kg)     GEN:  Well nourished, well developed in no acute distress HEENT: Normal NECK: No JVD; No carotid bruits LYMPHATICS: No lymphadenopathy CARDIAC: RRR, no murmurs, rubs, gallops RESPIRATORY:  Clear to auscultation without rales, wheezing or rhonchi  ABDOMEN: Soft, non-tender, non-distended MUSCULOSKELETAL:  No edema; No deformity  SKIN: Warm and dry NEUROLOGIC:  Alert and oriented x 3 PSYCHIATRIC:  Normal affect   ASSESSMENT:    1. Paroxysmal atrial fibrillation (HCC)   2. Coronary artery disease involving native coronary artery of native heart without angina pectoris    PLAN:    In order of problems listed above:  Paroxysmal atrial fibrillation -Atrial fibrillation ablation Dr. Rayann Heman 2019 prior note reviewed.  Migraines like symptoms. CHADSVASC 6. Steroid shot AFIB plantar fasciitis. In eyes and jaw. Took Dilt and 30 min went away.  -- Has loop.  Battery okay.  Stroke 2018 -Right-sided heaviness.  Echocardiogram normal.  Eliquis.  Coronary artery disease -Prior MI no PCI done.  No angina.  Secondary risk factor prevention.  Morbid obesity -BMI greater than 35 with 2 more comorbidities continue to encourage weight loss.  Hyperlipidemia -Continue with statin.  Her LDL 46 hemoglobin A1c 5.7 creatinine normal.  Labs were done in care  everywhere, [facility]   Medication Adjustments/Labs and Tests Ordered: Current medicines are reviewed at length with the patient today.  Concerns regarding medicines are outlined above.  No orders of the defined types were placed in this encounter.  No orders of the defined types were placed in this encounter.   Patient Instructions  Medication Instructions:  The current medical regimen is effective;  continue present plan and medications.  *If you need a refill on your cardiac medications before your next appointment, please call your pharmacy*  Follow-Up: At [facility], you and your health needs are our priority.  As part of our continuing mission to provide you with exceptional heart care, we have created designated Provider Care Teams.  These Care Teams include your primary Cardiologist (physician) and Advanced Practice Providers (APPs -  Physician Assistants and Nurse Practitioners) who all work together to provide you with the care you need, when you need it.  We recommend signing up for the patient portal called "MyChart".  Sign up information is provided on this After Visit Summary.  MyChart is used to connect with patients for Virtual Visits (Telemedicine).  Patients are able to view lab/test results, encounter notes, upcoming appointments, etc.  Non-urgent messages can  be sent to your provider as well.   To learn more about what you can do with MyChart, go to [email or web address].    Your next appointment:   12 month(s)  The format for your next appointment:   In Person  Provider:   Candee Furbish, MD   Thank you for choosing [facility]!!        Signed, Candee Furbish, MD  10/15/2019 5:00 PM    [place]

## 2019-10-15 NOTE — Patient Instructions (Signed)
Medication Instructions:  The current medical regimen is effective;  continue present plan and medications.  *If you need a refill on your cardiac medications before your next appointment, please call your pharmacy*  Follow-Up: At [facility], you and your health needs are our priority.  As part of our continuing mission to provide you with exceptional heart care, we have created designated Provider Care Teams.  These Care Teams include your primary Cardiologist (physician) and Advanced Practice Providers (APPs -  Physician Assistants and Nurse Practitioners) who all work together to provide you with the care you need, when you need it.  We recommend signing up for the patient portal called "MyChart".  Sign up information is provided on this After Visit Summary.  MyChart is used to connect with patients for Virtual Visits (Telemedicine).  Patients are able to view lab/test results, encounter notes, upcoming appointments, etc.  Non-urgent messages can be sent to your provider as well.   To learn more about what you can do with MyChart, go to [email or web address].    Your next appointment:   12 month(s)  The format for your next appointment:   In Person  Provider:   Mark Skains, MD   Thank you for choosing [facility]!!      

## 2019-10-19 LAB — CUP PACEART REMOTE DEVICE CHECK
Date Time Interrogation Session: 20210426235957
Implantable Pulse Generator Implant Date: 20180302

## 2019-10-20 ENCOUNTER — Telehealth: Payer: Self-pay

## 2019-10-20 ENCOUNTER — Ambulatory Visit (INDEPENDENT_AMBULATORY_CARE_PROVIDER_SITE_OTHER): Payer: 59 | Admitting: *Deleted

## 2019-10-20 DIAGNOSIS — I480 Paroxysmal atrial fibrillation: Secondary | ICD-10-CM | POA: Diagnosis not present

## 2019-10-20 NOTE — Telephone Encounter (Signed)
Received [facility] alert 10/18/2019 for presenting episode of AF w/ RVR 130's. Currently taking Eliquis and Cardizem, no missed doses.   Called patient to assess and send manual transmission. States she woke up from sleep during the night of 10/18/19 with palpitations. Took 1 cardizem 30 mg tablet, then shortly felt better. Patient denies any complaints since episode.           Manual Transmission 10/20/19  Presenting   Routed to Dr. Caryl Comes for any further recommendations.

## 2019-10-20 NOTE — Telephone Encounter (Signed)
L  good am again  Lets have her transmit and see if she has reverted to sinus  She is on anticoagulation and if she is still in afib, then we can send her to afib clinic for assistance with DCCV  Thanks SK

## 2019-10-21 NOTE — Progress Notes (Signed)
ILR Remote 

## 2019-10-22 NOTE — Telephone Encounter (Signed)
Good afternoon Dr. Caryl Comes,  Elaine Jackson sent a manual transmission on 10/20/19 and she had reverted back to SR. The manual transmission is below the episodes noted in red writing. That was her current EGM she sent while I was on the phone with her.   Thanks, Marliss Czar

## 2019-11-19 ENCOUNTER — Ambulatory Visit (INDEPENDENT_AMBULATORY_CARE_PROVIDER_SITE_OTHER): Payer: 59 | Admitting: *Deleted

## 2019-11-19 DIAGNOSIS — I639 Cerebral infarction, unspecified: Secondary | ICD-10-CM

## 2019-11-19 LAB — CUP PACEART REMOTE DEVICE CHECK
Date Time Interrogation Session: 20210528003440
Implantable Pulse Generator Implant Date: 20180302

## 2019-11-19 NOTE — Progress Notes (Signed)
Carelink Summary Report / Loop Recorder 

## 2019-12-14 ENCOUNTER — Ambulatory Visit
Admission: RE | Admit: 2019-12-14 | Discharge: 2019-12-14 | Disposition: A | Discharge status: Home / Self Care | Payer: 59 | Source: Ambulatory Visit | Attending: Family Medicine | Admitting: Family Medicine

## 2019-12-14 ENCOUNTER — Other Ambulatory Visit: Payer: Self-pay

## 2019-12-14 DIAGNOSIS — Z1231 Encounter for screening mammogram for malignant neoplasm of breast: Secondary | ICD-10-CM

## 2019-12-22 ENCOUNTER — Ambulatory Visit (INDEPENDENT_AMBULATORY_CARE_PROVIDER_SITE_OTHER): Payer: 59 | Admitting: *Deleted

## 2019-12-22 DIAGNOSIS — I639 Cerebral infarction, unspecified: Secondary | ICD-10-CM | POA: Diagnosis not present

## 2019-12-22 LAB — CUP PACEART REMOTE DEVICE CHECK
Date Time Interrogation Session: 20210630020211
Implantable Pulse Generator Implant Date: 20180302

## 2019-12-23 NOTE — Progress Notes (Signed)
Carelink Summary Report / Loop Recorder 

## 2020-01-24 ENCOUNTER — Ambulatory Visit (INDEPENDENT_AMBULATORY_CARE_PROVIDER_SITE_OTHER): Payer: 59 | Admitting: *Deleted

## 2020-01-24 DIAGNOSIS — I639 Cerebral infarction, unspecified: Secondary | ICD-10-CM | POA: Diagnosis not present

## 2020-01-24 LAB — CUP PACEART REMOTE DEVICE CHECK
Date Time Interrogation Session: 20210802020035
Implantable Pulse Generator Implant Date: 20180302

## 2020-01-26 NOTE — Progress Notes (Signed)
Carelink Summary Report / Loop Recorder 

## 2020-01-31 ENCOUNTER — Other Ambulatory Visit (HOSPITAL_COMMUNITY): Payer: Self-pay | Admitting: Nurse Practitioner

## 2020-02-13 ENCOUNTER — Other Ambulatory Visit: Payer: Self-pay | Admitting: Internal Medicine

## 2020-02-15 NOTE — Telephone Encounter (Signed)
Per Dr. Rayann Heman pt follows A-Fib clinic. Please address

## 2020-02-27 LAB — CUP PACEART REMOTE DEVICE CHECK
Date Time Interrogation Session: 20210904020628
Implantable Pulse Generator Implant Date: 20180302

## 2020-02-29 ENCOUNTER — Ambulatory Visit (INDEPENDENT_AMBULATORY_CARE_PROVIDER_SITE_OTHER): Payer: 59 | Admitting: Physician Assistant

## 2020-02-29 ENCOUNTER — Other Ambulatory Visit: Payer: Self-pay

## 2020-02-29 ENCOUNTER — Encounter: Payer: Self-pay | Admitting: Physician Assistant

## 2020-02-29 ENCOUNTER — Ambulatory Visit (INDEPENDENT_AMBULATORY_CARE_PROVIDER_SITE_OTHER): Payer: 59 | Admitting: *Deleted

## 2020-02-29 DIAGNOSIS — Z86006 Personal history of melanoma in-situ: Secondary | ICD-10-CM

## 2020-02-29 DIAGNOSIS — C44619 Basal cell carcinoma of skin of left upper limb, including shoulder: Secondary | ICD-10-CM | POA: Diagnosis not present

## 2020-02-29 DIAGNOSIS — Z1283 Encounter for screening for malignant neoplasm of skin: Secondary | ICD-10-CM | POA: Diagnosis not present

## 2020-02-29 DIAGNOSIS — D239 Other benign neoplasm of skin, unspecified: Secondary | ICD-10-CM

## 2020-02-29 DIAGNOSIS — I639 Cerebral infarction, unspecified: Secondary | ICD-10-CM | POA: Diagnosis not present

## 2020-02-29 DIAGNOSIS — D1800 Hemangioma unspecified site: Secondary | ICD-10-CM

## 2020-02-29 DIAGNOSIS — L578 Other skin changes due to chronic exposure to nonionizing radiation: Secondary | ICD-10-CM

## 2020-02-29 DIAGNOSIS — L821 Other seborrheic keratosis: Secondary | ICD-10-CM

## 2020-02-29 DIAGNOSIS — L814 Other melanin hyperpigmentation: Secondary | ICD-10-CM

## 2020-02-29 DIAGNOSIS — D229 Melanocytic nevi, unspecified: Secondary | ICD-10-CM

## 2020-02-29 DIAGNOSIS — Z85828 Personal history of other malignant neoplasm of skin: Secondary | ICD-10-CM

## 2020-02-29 DIAGNOSIS — C44519 Basal cell carcinoma of skin of other part of trunk: Secondary | ICD-10-CM | POA: Diagnosis not present

## 2020-02-29 NOTE — Progress Notes (Addendum)
Follow-Up Visit   Subjective  Elaine Jackson is a 64 y.o. female who presents for the following: Follow-up (skin check--noticed a spot on top of head and left shoulder).   The following portions of the chart were reviewed this encounter and updated as appropriate: Tobacco  Allergies  Meds  Problems  Med Hx  Surg Hx  Fam Hx      Objective  Well appearing patient in no apparent distress; mood and affect are within normal limits.  All skin waist up examined.  Objective  Left Forearm - Anterior: Scar clear  Objective  Mid Back: Scars clear  Objective  waist up: No atypical nevi   Objective  Chest - Medial [facility]): Hyperkeratotic scale with pink base      Objective  left supraclavicular: Pearly papule with telangectasia.      Objective  Left deltoid: Hyperkeratotic scale with pink base      Assessment & Plan  Personal history of melanoma in-situ Left Forearm - Anterior  observe  History of basal cell carcinoma (BCC) Mid Back  observe  Screening exam for skin cancer waist up  Skin examinations  BCC (basal cell carcinoma), chest (2) Chest - Medial ([place])  Skin / nail biopsy Type of biopsy: tangential   Informed consent: discussed and consent obtained   Timeout: patient name, date of birth, surgical site, and procedure verified   Anesthesia: the lesion was anesthetized in a standard fashion   Anesthetic:  1% lidocaine w/ epinephrine 1-100,000 local infiltration Instrument used: flexible razor blade   Hemostasis achieved with: aluminum chloride and electrodesiccation   Outcome: patient tolerated procedure well   Post-procedure details: wound care instructions given    Destruction of lesion Complexity: simple   Destruction method: electrodesiccation and curettage   Informed consent: discussed and consent obtained   Timeout:  patient name, date of birth, surgical site, and procedure verified Anesthesia: the lesion was anesthetized in  a standard fashion   Anesthetic:  1% lidocaine w/ epinephrine 1-100,000 local infiltration Curettage performed in three different directions: Yes   Electrodesiccation performed over the curetted area: Yes   Curettage cycles:  3 Margin per side (cm):  0.1 Final wound size (cm):  1.2 Hemostasis achieved with:  aluminum chloride Outcome: patient tolerated procedure well with no complications   Post-procedure details: wound care instructions given    Specimen 1 - Surgical pathology Differential Diagnosis: BCC Check Margins: No  left supraclavicular  Skin / nail biopsy Type of biopsy: tangential   Informed consent: discussed and consent obtained   Timeout: patient name, date of birth, surgical site, and procedure verified   Anesthesia: the lesion was anesthetized in a standard fashion   Anesthetic:  1% lidocaine w/ epinephrine 1-100,000 local infiltration Instrument used: flexible razor blade   Hemostasis achieved with: aluminum chloride and electrodesiccation   Outcome: patient tolerated procedure well   Post-procedure details: wound care instructions given    Destruction of lesion Complexity: simple   Destruction method: electrodesiccation and curettage   Informed consent: discussed and consent obtained   Timeout:  patient name, date of birth, surgical site, and procedure verified Anesthesia: the lesion was anesthetized in a standard fashion   Anesthetic:  1% lidocaine w/ epinephrine 1-100,000 local infiltration Curettage performed in three different directions: Yes   Electrodesiccation performed over the curetted area: Yes   Curettage cycles:  3 Margin per side (cm):  0.1 Final wound size (cm):  1 Hemostasis achieved with:  aluminum chloride Outcome: patient  tolerated procedure well with no complications   Post-procedure details: wound care instructions given    Specimen 2 - Surgical pathology Differential Diagnosis: BCC Check Margins: No  BCC (basal cell carcinoma),  arm, left Left deltoid  Skin / nail biopsy Type of biopsy: tangential   Informed consent: discussed and consent obtained   Timeout: patient name, date of birth, surgical site, and procedure verified   Anesthesia: the lesion was anesthetized in a standard fashion   Anesthetic:  1% lidocaine w/ epinephrine 1-100,000 local infiltration Instrument used: flexible razor blade   Hemostasis achieved with: aluminum chloride and electrodesiccation   Outcome: patient tolerated procedure well   Post-procedure details: wound care instructions given    Destruction of lesion Complexity: simple   Destruction method: electrodesiccation and curettage   Informed consent: discussed and consent obtained   Timeout:  patient name, date of birth, surgical site, and procedure verified Anesthesia: the lesion was anesthetized in a standard fashion   Anesthetic:  1% lidocaine w/ epinephrine 1-100,000 local infiltration Curettage performed in three different directions: Yes   Electrodesiccation performed over the curetted area: Yes   Curettage cycles:  3 Margin per side (cm):  0.1 Final wound size (cm):  0.8 Hemostasis achieved with:  aluminum chloride Outcome: patient tolerated procedure well with no complications   Post-procedure details: wound care instructions given    Specimen 3 - Surgical pathology Differential Diagnosis: BCC Check Margins: No Lentigines - Scattered tan macules - Discussed due to sun exposure - Benign, observe - Call for any changes  Seborrheic Keratoses - Stuck-on, waxy, tan-brown papules and plaques  - Discussed benign etiology and prognosis. - Observe - Call for any changes  Melanocytic Nevi - Tan-brown and/or pink-flesh-colored symmetric macules and papules - Benign appearing on exam today - Observation - Call clinic for new or changing moles - Recommend daily use of broad spectrum spf 30+ sunscreen to sun-exposed areas.   Hemangiomas - Red papules - Discussed  benign nature - Observe - Call for any changes  Actinic Damage - diffuse scaly erythematous macules with underlying dyspigmentation - Recommend daily broad spectrum sunscreen SPF 30+ to sun-exposed areas, reapply every 2 hours as needed.  - Call for new or changing lesions.  Dermatofibroma - Firm pink/brown papulenodule with dimple sign - Benign appearing - Call for any changes    I, Juma Oxley, PA-C, have reviewed all documentation's for this visit.  The documentation on 03/03/20 for the exam, diagnosis, procedures and orders are all accurate and complete.

## 2020-03-01 NOTE — Progress Notes (Signed)
Carelink Summary Report / Loop Recorder 

## 2020-03-16 ENCOUNTER — Other Ambulatory Visit: Payer: Self-pay | Admitting: Orthopedic Surgery

## 2020-03-16 ENCOUNTER — Telehealth: Payer: Self-pay

## 2020-03-16 DIAGNOSIS — S46019A Strain of muscle(s) and tendon(s) of the rotator cuff of unspecified shoulder, initial encounter: Secondary | ICD-10-CM

## 2020-03-16 NOTE — Telephone Encounter (Signed)
Patient fell and hurt her arm and now needs a mri and wants to know if she can with her LNQ11. Please call patient back

## 2020-03-16 NOTE — Telephone Encounter (Signed)
Called patient back, per Elaine Jackson pt is okay to get a MRI with her LNQ11. Patient is aware

## 2020-03-17 NOTE — Telephone Encounter (Signed)
MRI clearance letter faxed.  Confirmation received.

## 2020-03-22 ENCOUNTER — Telehealth: Payer: Self-pay

## 2020-03-22 NOTE — Telephone Encounter (Signed)
Elaine Jackson pt to inform her that her [place] patient assistance application was ready to be picked up and that Dr. Marlou Porch has filled out his part and the papers will be at the front desk for pt to pick up. Made a copy and put it with Jeani Hawking, LPN, papers.  Pt verbalized understanding.

## 2020-03-30 LAB — CUP PACEART REMOTE DEVICE CHECK
Date Time Interrogation Session: 20211007020830
Implantable Pulse Generator Implant Date: 20180302

## 2020-04-03 ENCOUNTER — Ambulatory Visit (INDEPENDENT_AMBULATORY_CARE_PROVIDER_SITE_OTHER): Payer: Medicare Other

## 2020-04-03 DIAGNOSIS — I639 Cerebral infarction, unspecified: Secondary | ICD-10-CM | POA: Diagnosis not present

## 2020-04-05 NOTE — Progress Notes (Signed)
Carelink Summary Report / Loop Recorder 

## 2020-04-08 ENCOUNTER — Other Ambulatory Visit: Payer: 59

## 2020-04-12 ENCOUNTER — Telehealth: Payer: Self-pay | Admitting: Emergency Medicine

## 2020-04-12 NOTE — Telephone Encounter (Signed)
LMOM to call DC, number and office hours provided. LINQ at RRT. Discuss options.

## 2020-04-17 NOTE — Telephone Encounter (Signed)
Patient is calling back returning call from nurse about RRT on her device

## 2020-04-17 NOTE — Telephone Encounter (Signed)
Attempted to return patient phone call, no answer.  LVM on identified VM.

## 2020-04-18 NOTE — Telephone Encounter (Signed)
The pt returned nurse call. I told her her device has reached RRT. She can choose to have it removed or choose to leave it in. I told her she can unplug the monitor because she no longer need it. I ordered her a return kit.

## 2020-05-02 NOTE — Telephone Encounter (Signed)
**Note De-Identified [name] Obfuscation** Letter received from [facility] stating that they have approved the pt for assistance with her Eliquis. Approval is valid until 98/11/9994 Application Case#: [id number]  The letter states that they has also notified the pt of this approval as well.

## 2020-05-22 ENCOUNTER — Ambulatory Visit (HOSPITAL_COMMUNITY): Payer: Medicare Other | Admitting: Nurse Practitioner

## 2020-05-22 NOTE — Progress Notes (Signed)
Primary Care Physician: [name], MD Referring Physician: Dr. Royetta Crochet is a 64 y.o. female with a h/o afib,CAD, HTN, previous CVA, s/p ablation, 09/2017.  She had  done well until she developed afib in the setting of steriod shot and taper for an injured foot. Unfortunately, this caused her BP to elevate and then onset of afib. She saw Dr. Rayann Heman  and placed on daily Cardizem.     F/u in afib clinic 05/23/20 She feels well. Just one episode of afib that resolved within 30 mins taking cardizem.  No issues with eliquis with a CHA2DS2VASc score of 6. Recent labs with PCP show normal BMET and CBC.   Today, she denies symptoms of palpitations, chest pain, shortness of breath, orthopnea, PND, lower extremity edema, dizziness, presyncope, syncope, or neurologic sequela. The patient is tolerating medications without difficulties and is otherwise without complaint today.   Past Medical History:  Diagnosis Date  . Allergic rhinitis, cause unspecified   . Allergy   . Basal cell carcinoma 10/04/1996   right chest-TX-DR.Turner  . BCC (basal cell carcinoma) 12/05/2000   upper mid back Miguel Barrera  . BCC (basal cell carcinoma) 12/05/2000   Left upper back TX Dr. Ronnald Ramp  . BCC (basal cell carcinoma) 09/16/2001   left upper back TX Dr.Jones  . BCC (basal cell carcinoma) 08/15/2003   left upper mid back T Dr.Jones  . BCC (basal cell carcinoma) 08/15/2003   right upper mid back Dr. Ronnald Ramp  . BCC (basal cell carcinoma) 08/15/2003   left medial breast TX Dr.jones  . BCC (basal cell carcinoma) 10/12/2004   left anterior shin tx Dr.jones  . BCC (basal cell carcinoma) 09/14/2007   right of sternum-Tx-CX49fu  . BCC (basal cell carcinoma) 10/20/2012   left post sholder-tx CX35FU  . BCC (basal cell carcinoma) 10/20/2012   left sholder tip-TX- CX35FU  . BCC (basal cell carcinoma) 10/20/2012   left temple-TX CX35FU  . BCC (basal cell carcinoma) 10/20/2012   left outer calf-TX cx90fu    . BCC (basal cell carcinoma) 10/20/2012   left upper arm-tx cx94fu  . BCC (basal cell carcinoma) 10/20/2012   right sholder tip-tx cx35fu  . BCC (basal cell carcinoma) 11/10/2013   right upper back-cx78fu  . BCC (basal cell carcinoma) 06/07/2014   left inferior post sholder-TXpbx  . BCC (basal cell carcinoma) 06/13/2015   righ post sholder-TXpbx  . BCC (basal cell carcinoma) 06/13/2015   left upper ankle  . BCC (basal cell carcinoma) 03/18/2017   right outer back-txpbx  . BCC (basal cell carcinoma) 03/18/2017   left trapezius-txpbx  . BCC (basal cell carcinoma) 03/18/2017   left deltoid-txpbx  . BCC (basal cell carcinoma) 06/05/2017   right rhomboid-txpbx  . BCC (basal cell carcinoma) 04/28/2018   right tragus-txpbx  . BCC (basal cell carcinoma) 06/09/2018   mid upper back txpbx  . BCC (basal cell carcinoma) 03/09/2019   left deltoid inferior txpbx  . BCC (basal cell carcinoma) 03/09/2019   left chest medial txpbx  . CAD (coronary artery disease)    LHC 12/20/2014 showed small to moderate apical infarction due to distal LAD stenosis with reestablished TIMI2 flow. No PCI. Recommended plavix for 1 yr and ASA for life.   . Chicken pox   . Chronic bronchitis ([place])   . Family history of breast cancer in first degree relative    sister age 49  . Heart murmur   . Hyperlipidemia   . Hypertension   .  Hypothyroidism   . Malignant neoplasm skin of face 02/16/2007   Basal Cell Carcinoma  [place].  . Measles   . Melanoma ([place]) 12/17/2017   left forearm-MIS TX- Excision  . Migraine <1982   "went away when I got pregnant" (09/25/2017)  . Myocardial infarction ([place]) 01/2015  . Obesity, unspecified   . Other chronic nonalcoholic liver disease   . Pre-diabetes   . Skin cancer 10/12/2004   right anterior shin-CIS-TX Dr.Jones  . Skin cancer 03/10/2013   right lower shin-txpbx CIS  . Skin cancer 01/18/2015   left neck-KA cx1 + Cautery  . Skin cancer 02/28/2015   left neck-excision   . Skin cancer 06/05/2017   right scapula-CIS  . Skin cancer 04/28/2018   left ear helix-well diff scc-txpbx  . Sleep related leg cramps   . Stroke (cerebrum) ([place])    due to large vessel disease; denies residual on 09/25/2017  . Thyroid nodule    10-2016   . Unspecified vitamin D deficiency   . Vision abnormalities    Past Surgical History:  Procedure Laterality Date  . ATRIAL FIBRILLATION ABLATION  09/25/2017  . ATRIAL FIBRILLATION ABLATION N/A 09/25/2017   Procedure: ATRIAL FIBRILLATION ABLATION;  Surgeon: Thompson Grayer, MD;  Location: [facility];  Service: Cardiovascular;  Laterality: N/A;  . CARDIAC CATHETERIZATION N/A 12/20/2014   Procedure: Left Heart Cath and Coronary Angiography;  Surgeon: Sanda Klein, MD;  Location: [facility];  Service: Cardiovascular;  Laterality: N/A;  . COLONOSCOPY  06/25/2007   [place]. Normal. Repeat in ten years.  . CYSTOSTOMY W/ BLADDER DILATION  1960s   "bladder stem stretched"  . LAPAROSCOPIC CHOLECYSTECTOMY    . LAPAROSCOPIC GASTRIC BANDING  07/03/10   Dr. Sherrin Daisy; [facility]  . LOOP RECORDER INSERTION N/A 08/23/2016   Procedure: Loop Recorder Insertion;  Surgeon: Deboraha Sprang, MD;  Location: [facility];  Service: Cardiovascular;  Laterality: N/A;  . SQUAMOUS CELL CARCINOMA EXCISION Left    neck  . THYROIDECTOMY N/A 12/17/2016   Procedure: TOTAL THYROIDECTOMY;  Surgeon: Excell Seltzer, MD;  Location: [facility];  Service: General;  Laterality: N/A;  . TONSILLECTOMY      Current Outpatient Medications  Medication Sig Dispense Refill  . atorvastatin (LIPITOR) 80 MG tablet Take 80 mg by mouth daily.    Marland Kitchen azelastine (ASTELIN) 0.1 % nasal spray Place 2 sprays into both nostrils as needed for rhinitis. Use in each nostril as directed    . B COMPLEX-C-FOLIC ACID PO Take 1 tablet by mouth daily.    . cholecalciferol (VITAMIN D) 400 units TABS tablet Take 2,000 Units by mouth daily.     . CONTOUR NEXT TEST test strip AS DIRECTED  100 each 4  . diltiazem (CARDIZEM CD) 120 MG 24 hr capsule TAKE 1 CAPSULE (120 MG TOTAL) BY MOUTH DAILY AS NEEDED (FOR BREAKTHROUGH AFIB). 90 capsule 1  . diltiazem (CARDIZEM) 30 MG tablet TAKE 1 TABLET EVERY 4 HOURS AS NEEDED FOR RAPID AFIB HEART RATE >100 45 tablet 2  . ELIQUIS 5 MG TABS tablet TAKE 1 TABLET BY MOUTH TWICE A DAY 180 tablet 2  . fluticasone (FLONASE) 50 MCG/ACT nasal spray Place 2 sprays into both nostrils daily as needed for allergies. 48 g 3  . furosemide (LASIX) 20 MG tablet Take 20 mg by mouth as needed.    Marland Kitchen levothyroxine (SYNTHROID, LEVOTHROID) 125 MCG tablet Take 1 tablet (125 mcg total) by mouth daily before breakfast. BEFORE BREAKFAST WITH A  FULL GLASS OF WATER. 90 tablet 3  . losartan (COZAAR) 25 MG tablet TAKE 1 TABLET BY MOUTH EVERY DAY 90 tablet 3  . montelukast (SINGULAIR) 10 MG tablet Take 10 mg by mouth daily.    . potassium chloride SA (K-DUR,KLOR-CON) 20 MEQ tablet Take 20 mEq by mouth as needed. Take with lasix.     No current facility-administered medications for this encounter.    Allergies  Allergen Reactions  . Sulfa Antibiotics Nausea And Vomiting  . Zofran [Ondansetron Hcl] Other (See Comments)    Oral numbness with IV dosing.     Social History   Socioeconomic History  . Marital status: Married    Spouse name: Not on file  . Number of children: 1  . Years of education: Not on file  . Highest education level: Not on file  Occupational History  . Occupation: HAIR STYLIST    Employer: [employer]  Tobacco Use  . Smoking status: Former Smoker    Packs/day: 1.00    Years: 25.00    Pack years: 25.00    Types: Cigarettes    Quit date: 03/26/1995    Years since quitting: 25.1  . Smokeless tobacco: Never Used  Vaping Use  . Vaping Use: Never used  Substance and Sexual Activity  . Alcohol use: Yes  . Drug use: No  . Sexual activity: Not Currently    Birth control/protection: Post-menopausal  Other Topics Concern  . Not on file    Social History Narrative   Marital status:  Married; x 27 years. Second marriage. 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      Tobacco:  None      Alcohol: rare/minimal.      Drugs:  None       Exercise:        Seatbelt:  100%.       Sunscreen: SPF 15.      Guns:  Maybe?   Social Determinants of Health   Financial Resource Strain:   . Difficulty of Paying Living Expenses: Not on file  Food Insecurity:   . Worried About Charity fundraiser in the Last Year: Not on file  . Ran Out of Food in the Last Year: Not on file  Transportation Needs:   . Lack of Transportation (Medical): Not on file  . Lack of Transportation (Non-Medical): Not on file  Physical Activity:   . Days of Exercise per Week: Not on file  . Minutes of Exercise per Session: Not on file  Stress:   . Feeling of Stress : Not on file  Social Connections:   . Frequency of Communication with Friends and Family: Not on file  . Frequency of Social Gatherings with Friends and Family: Not on file  . Attends Religious Services: Not on file  . Active Member of Clubs or Organizations: Not on file  . Attends Archivist Meetings: Not on file  . Marital Status: Not on file  Intimate Partner Violence:   . Fear of Current or Ex-Partner: Not on file  . Emotionally Abused: Not on file  . Physically Abused: Not on file  . Sexually Abused: Not on file    Family History  Problem Relation Age of Onset  . Diabetes Mother   . Heart  disease Mother        defibrillator; carotid artery stenosis.  . Hypertension Mother   . Hyperlipidemia Mother   . Dementia Mother   . Congestive Heart Failure Mother   . Heart disease Father   . Kidney failure Father        ESRD/peritoneal dialysis  . Diabetes Father   . Hypertension Father   . Hyperlipidemia Father   . Stroke  Father   . Kidney disease Father   . Congestive Heart Failure Father   . Diabetes Sister   . Hypertension Sister   . Kidney disease Sister        renal failure  . Diabetes Brother   . Hypertension Brother   . Hyperlipidemia Brother   . Arthritis Brother   . Cervical cancer Sister   . Hypertension Sister   . Liver cancer Sister 66  . Cancer Sister        Liver cancer  . Stroke Maternal Grandmother   . Heart disease Maternal Grandfather   . Stroke Paternal Grandmother   . Heart disease Paternal Grandmother   . Heart disease Paternal Grandfather   . Stroke Paternal Grandfather   . Breast cancer Sister 8  . Hepatitis C Sister 50  . Thyroid disease Neg Hx     ROS- All systems are reviewed and negative except as per the HPI above  Physical Exam: There were no vitals filed for this visit. Wt Readings from Last 3 Encounters:  10/15/19 91.5 kg  08/18/19 90.7 kg  02/17/19 91.6 kg    Labs: Lab Results  Component Value Date   NA 142 12/22/2017   K 4.7 12/22/2017   CL 103 12/22/2017   CO2 24 12/22/2017   GLUCOSE 130 (H) 12/22/2017   BUN 20 12/22/2017   CREATININE 1.00 12/22/2017   CALCIUM 9.4 12/22/2017   Lab Results  Component Value Date   INR 0.97 08/20/2016   Lab Results  Component Value Date   CHOL 147 12/22/2017   HDL 60 12/22/2017   LDLCALC 71 12/22/2017   TRIG 80 12/22/2017     GEN- The patient is well appearing, alert and oriented x 3 today.   Head- normocephalic, atraumatic Eyes-  Sclera clear, conjunctiva pink Ears- hearing intact Oropharynx- clear Neck- supple, no JVP Lymph- no cervical lymphadenopathy Lungs- Clear to ausculation bilaterally, normal work of breathing Heart- Regular rate and rhythm, no murmurs, rubs or gallops, PMI not laterally displaced GI- soft, NT, ND, + BS Extremities- no clubbing, cyanosis, or edema MS- no significant deformity or atrophy Skin- no rash or lesion Psych- euthymic mood, full affect Neuro- strength and  sensation are intact  EKG-Sinus rhythm 58 bpm, pr int 144 ms, qrs int 78 ms, qtc 398 ms Epic records reviewed   Assessment and Plan:  1. Paroxysmal afib  S/p ablation 09/2017 and  staying in SR    Previous daily cardizem 120 mg  is as needed as afib has been quiet Has 30 mg cardizem to use prn as well  Continue Eliquis 5 mg bid for chadsvasc score of at least 6 Recent cbc/bmet wnl   2. HTN Stabe  3. CAD No anginal c/o's Per Dr. Marlou Porch   F/u in afib clinic in  one year as or needed   Butch Penny C. Aydin Cavalieri, [facility] [address] [place], [place] [address] [phone]

## 2020-05-23 ENCOUNTER — Ambulatory Visit (HOSPITAL_COMMUNITY)
Admission: RE | Admit: 2020-05-23 | Discharge: 2020-05-23 | Disposition: A | Discharge status: Home / Self Care | Payer: Medicare Other | Source: Ambulatory Visit | Attending: Nurse Practitioner | Admitting: Nurse Practitioner

## 2020-05-23 ENCOUNTER — Encounter (HOSPITAL_COMMUNITY): Payer: Self-pay | Admitting: Nurse Practitioner

## 2020-05-23 ENCOUNTER — Other Ambulatory Visit: Payer: Self-pay

## 2020-05-23 VITALS — BP 138/80 | HR 58 | Ht 62.0 in | Wt 199.6 lb

## 2020-05-23 DIAGNOSIS — Z7901 Long term (current) use of anticoagulants: Secondary | ICD-10-CM | POA: Insufficient documentation

## 2020-05-23 DIAGNOSIS — D6869 Other thrombophilia: Secondary | ICD-10-CM | POA: Diagnosis not present

## 2020-05-23 DIAGNOSIS — I10 Essential (primary) hypertension: Secondary | ICD-10-CM | POA: Diagnosis not present

## 2020-05-23 DIAGNOSIS — I2510 Atherosclerotic heart disease of native coronary artery without angina pectoris: Secondary | ICD-10-CM | POA: Insufficient documentation

## 2020-05-23 DIAGNOSIS — I498 Other specified cardiac arrhythmias: Secondary | ICD-10-CM | POA: Insufficient documentation

## 2020-05-23 DIAGNOSIS — I480 Paroxysmal atrial fibrillation: Secondary | ICD-10-CM

## 2020-07-04 ENCOUNTER — Other Ambulatory Visit: Payer: Self-pay

## 2020-07-04 ENCOUNTER — Encounter: Payer: Self-pay | Admitting: Physician Assistant

## 2020-07-04 ENCOUNTER — Ambulatory Visit (INDEPENDENT_AMBULATORY_CARE_PROVIDER_SITE_OTHER): Payer: Medicare Other | Admitting: Physician Assistant

## 2020-07-04 DIAGNOSIS — Z85828 Personal history of other malignant neoplasm of skin: Secondary | ICD-10-CM | POA: Diagnosis not present

## 2020-07-04 DIAGNOSIS — Z86007 Personal history of in-situ neoplasm of skin: Secondary | ICD-10-CM

## 2020-07-04 DIAGNOSIS — Z1283 Encounter for screening for malignant neoplasm of skin: Secondary | ICD-10-CM

## 2020-07-04 DIAGNOSIS — L570 Actinic keratosis: Secondary | ICD-10-CM

## 2020-07-04 DIAGNOSIS — Z8589 Personal history of malignant neoplasm of other organs and systems: Secondary | ICD-10-CM

## 2020-07-04 DIAGNOSIS — D485 Neoplasm of uncertain behavior of skin: Secondary | ICD-10-CM

## 2020-07-04 DIAGNOSIS — Z85820 Personal history of malignant melanoma of skin: Secondary | ICD-10-CM

## 2020-07-04 NOTE — Patient Instructions (Signed)

## 2020-07-04 NOTE — Progress Notes (Signed)
   Follow-Up Visit   Subjective  Elaine Jackson is a 65 y.o. female who presents for the following: Follow-up (Patient here today for follow up. Per patient she does have a few places that just popped up on her right arm, left post shoulder, head and right leg only bleeding when scratching.).   The following portions of the chart were reviewed this encounter and updated as appropriate:  Tobacco  Allergies  Meds  Problems  Med Hx  Surg Hx  Fam Hx      Objective  Well appearing patient in no apparent distress; mood and affect are within normal limits.  A full examination was performed including scalp, head, eyes, ears, nose, lips, neck, chest, axillae, abdomen, back, buttocks, bilateral upper extremities, bilateral lower extremities, hands, feet, fingers, toes, fingernails, and toenails. All findings within normal limits unless otherwise noted below.  No LAD  Objective  Left Forearm - Anterior: Dyspigmented scar.   Objective  Head - to toe: No atypical nevi Head to toe exam today  Objective  Right Chest: Multiple BCC's- all scars clear  Objective  Left Neck: Dyspigmented scar.   Objective  Right Ant. Shin: Dyspigmented scar.   Objective  Left Chest, Left Forearm - Posterior (2), Left Tip of Nose, Right Forearm - Posterior (2), Right Upper Arm - Posterior (3): Erythematous patches with gritty scale.  Objective  Right Mid Parietal Scalp: Hyperkeratotic scale with pink base      Assessment & Plan  Personal history of malignant melanoma of skin Left Forearm - Anterior  Biannual skin exam  Skin exam for malignant neoplasm Head - to toe  Skin exams  History of basal cell carcinoma (BCC) Right Chest  observe  History of squamous cell carcinoma Left Neck  observe  History of squamous cell carcinoma in situ (SCCIS) of skin Right Ant. Shin  observe  AK (actinic keratosis) (9) Right Upper Arm - Posterior (3); Left Forearm - Posterior (2); Right Forearm  - Posterior (2); Left Chest; Left Tip of Nose  Destruction of lesion - Left Chest, Left Forearm - Posterior, Left Tip of Nose, Right Forearm - Posterior, Right Upper Arm - Posterior Complexity: simple   Destruction method: cryotherapy   Informed consent: discussed and consent obtained   Timeout:  patient name, date of birth, surgical site, and procedure verified Lesion destroyed using liquid nitrogen: Yes   Cryotherapy cycles:  3 Outcome: patient tolerated procedure well with no complications   Post-procedure details: wound care instructions given    Neoplasm of uncertain behavior of skin Right Mid Parietal Scalp  Skin / nail biopsy Type of biopsy: tangential   Informed consent: discussed and consent obtained   Timeout: patient name, date of birth, surgical site, and procedure verified   Procedure prep:  Patient was prepped and draped in usual sterile fashion (Non sterile) Prep type:  Chlorhexidine Anesthesia: the lesion was anesthetized in a standard fashion   Anesthetic:  1% lidocaine w/ epinephrine 1-100,000 local infiltration Instrument used: flexible razor blade   Hemostasis achieved with: ferric subsulfate   Outcome: patient tolerated procedure well   Post-procedure details: sterile dressing applied and wound care instructions given   Dressing type: petrolatum    Specimen 1 - Surgical pathology Differential Diagnosis: R/O BCC vs SCC  Check Margins: No   I, Avenly Roberge, PA-C, have reviewed all documentation's for this visit.  The documentation on 07/04/20 for the exam, diagnosis, procedures and orders are all accurate and complete.

## 2020-07-18 ENCOUNTER — Telehealth: Payer: Self-pay

## 2020-07-18 NOTE — Telephone Encounter (Signed)
**Note De-Identified Kwadwo Taras Obfuscation** The pt left her application without any documents at he office. I called her to ask how many people live in her household as that question was left blank and she stated 2 people.  We discussed the normal document requirements per [employer] (proof of income from year prior to applying which is 2021 and her oop expense report from her pharmacy for 2022 which is the year she is applying) but the pt states that she was just approved for assistance with them in October 2021 so "they have what they need". I explained that [employer] may reach out to her [place] phone or letter if they need the missing documents.  She verbalized understanding.  I have completed the provider page of the application, printed her med/allery list, (I could not attach a copy of her Part D ins card as we do not have a copy and she is aware) and e-mailed all to Dr Bonita Quin nurse so she can obtain his signature, date it, and to fax to [facility] at the number written on cover letter included.

## 2020-07-19 NOTE — Telephone Encounter (Signed)
Signed paperwork faxed successfully

## 2020-08-11 NOTE — Telephone Encounter (Signed)
**Note De-Identified Elaine Jackson Obfuscation** Letter received from [facility] stating that the pt did not date her application for Eliquis asst. I have corrected this by witting in date of 07/19/2019 to match Dr Bonita Quin signature date of 07/19/2020 and have emailed the entire application to Dr Bonita Quin nurse so she can obtain his signature, date it, and to fax to [facility] at fax number written on cover letter. I also wrote a note on the cover letter "missing patient date added.

## 2020-08-14 NOTE — Telephone Encounter (Signed)
Faxed successfully 08/11/20 at 15:31.

## 2020-08-17 MED ORDER — VALSARTAN 80 MG PO TABS: 80.0000 mg | ORAL_TABLET | Freq: Every day | ORAL | 3 refills | Status: DC

## 2020-09-06 NOTE — Telephone Encounter (Signed)
Received fax from [facility] stating that Eliquis has been approved 09/05/2020 - 06/23/2021.  Application Case #: [id number]

## 2020-11-01 ENCOUNTER — Other Ambulatory Visit: Payer: Self-pay

## 2020-11-01 ENCOUNTER — Ambulatory Visit (INDEPENDENT_AMBULATORY_CARE_PROVIDER_SITE_OTHER): Payer: Medicare Other | Admitting: Cardiology

## 2020-11-01 ENCOUNTER — Encounter: Payer: Self-pay | Admitting: Cardiology

## 2020-11-01 VITALS — BP 140/70 | HR 64 | Ht 62.0 in | Wt 200.0 lb

## 2020-11-01 DIAGNOSIS — Z79899 Other long term (current) drug therapy: Secondary | ICD-10-CM

## 2020-11-01 DIAGNOSIS — I4819 Other persistent atrial fibrillation: Secondary | ICD-10-CM

## 2020-11-01 DIAGNOSIS — D6869 Other thrombophilia: Secondary | ICD-10-CM

## 2020-11-01 DIAGNOSIS — I480 Paroxysmal atrial fibrillation: Secondary | ICD-10-CM

## 2020-11-01 LAB — CBC
Hematocrit: 37.2 % (ref 34.0–46.6)
Hemoglobin: 12.4 g/dL (ref 11.1–15.9)
MCH: 31.3 pg (ref 26.6–33.0)
MCHC: 33.3 g/dL (ref 31.5–35.7)
MCV: 94 fL (ref 79–97)
Platelets: 275 10*3/uL (ref 150–450)
RBC: 3.96 x10E6/uL (ref 3.77–5.28)
RDW: 12.3 % (ref 11.7–15.4)
WBC: 7.4 10*3/uL (ref 3.4–10.8)

## 2020-11-01 LAB — COMPREHENSIVE METABOLIC PANEL
ALT: 21 IU/L (ref 0–32)
AST: 16 IU/L (ref 0–40)
Albumin/Globulin Ratio: 2.3 — ABNORMAL HIGH (ref 1.2–2.2)
Albumin: 4.5 g/dL (ref 3.8–4.8)
Alkaline Phosphatase: 70 IU/L (ref 44–121)
BUN/Creatinine Ratio: 20 (ref 12–28)
BUN: 18 mg/dL (ref 8–27)
Bilirubin Total: 0.6 mg/dL (ref 0.0–1.2)
CO2: 26 mmol/L (ref 20–29)
Calcium: 10 mg/dL (ref 8.7–10.3)
Chloride: 101 mmol/L (ref 96–106)
Creatinine, Ser: 0.89 mg/dL (ref 0.57–1.00)
Globulin, Total: 2 g/dL (ref 1.5–4.5)
Glucose: 109 mg/dL — ABNORMAL HIGH (ref 65–99)
Potassium: 4.8 mmol/L (ref 3.5–5.2)
Sodium: 142 mmol/L (ref 134–144)
Total Protein: 6.5 g/dL (ref 6.0–8.5)
eGFR: 72 mL/min/{1.73_m2} (ref >59)

## 2020-11-01 LAB — LIPID PANEL
Chol/HDL Ratio: 2.7 ratio (ref 0.0–4.4)
Cholesterol, Total: 136 mg/dL (ref 100–199)
HDL: 50 mg/dL (ref >39)
LDL Chol Calc (NIH): 70 mg/dL (ref 0–99)
Triglycerides: 83 mg/dL (ref 0–149)
VLDL Cholesterol Cal: 16 mg/dL (ref 5–40)

## 2020-11-01 NOTE — Patient Instructions (Signed)
Medication Instructions:  Your physician recommends that you continue on your current medications as directed. Please refer to the Current Medication list given to you today.  *If you need a refill on your cardiac medications before your next appointment, please call your pharmacy*   Lab Work: CMET, CBC, LIPID TODAY  If you have labs (blood work) drawn today and your tests are completely normal, you will receive your results only by: Marland Kitchen MyChart Message (if you have MyChart) OR . A paper copy in the mail If you have any lab test that is abnormal or we need to change your treatment, we will call you to review the results.   Testing/Procedures: NONE   Follow-Up: At [facility], you and your health needs are our priority.  As part of our continuing mission to provide you with exceptional heart care, we have created designated Provider Care Teams.  These Care Teams include your primary Cardiologist (physician) and Advanced Practice Providers (APPs -  Physician Assistants and Nurse Practitioners) who all work together to provide you with the care you need, when you need it.  We recommend signing up for the patient portal called "MyChart".  Sign up information is provided on this After Visit Summary.  MyChart is used to connect with patients for Virtual Visits (Telemedicine).  Patients are able to view lab/test results, encounter notes, upcoming appointments, etc.  Non-urgent messages can be sent to your provider as well.   To learn more about what you can do with MyChart, go to [email or web address].    Your next appointment:   1 year(s)  The format for your next appointment:   In Person  Provider:   Candee Furbish, MD   Other Instructions

## 2020-11-01 NOTE — Progress Notes (Signed)
Cardiology Office Note:    Date:  11/01/2020   ID:  Elaine Jackson, DOB [birth year], MRN [id number]  PCP:  [name], MD   [facility] Providers Cardiologist:  None     Referring MD: [name], MD     History of Present Illness:    Elaine Jackson is a 65 y.o. female here for the follow-up of atrial fibrillation, coronary disease, prior stroke, status post ablation in April 2019.  She had atrial fibrillation after steroid injection for her injured foot.  Prior visit with Roderic Palau in November 2021 felt well.  Taking Cardizem without any problems.  No fevers chills nausea vomiting syncope bleeding.  Past Medical History:  Diagnosis Date  . Allergic rhinitis, cause unspecified   . Allergy   . Basal cell carcinoma 10/04/1996   right chest-TX-DR.Turner  . BCC (basal cell carcinoma) 12/05/2000   upper mid back [place]  . BCC (basal cell carcinoma) 12/05/2000   Left upper back TX Dr. Ronnald Ramp  . BCC (basal cell carcinoma) 09/16/2001   left upper back TX Dr.Jones  . BCC (basal cell carcinoma) 08/15/2003   left upper mid back T Dr.Jones  . BCC (basal cell carcinoma) 08/15/2003   right upper mid back Dr. Ronnald Ramp  . BCC (basal cell carcinoma) 08/15/2003   left medial breast TX Dr.jones  . BCC (basal cell carcinoma) 10/12/2004   left anterior shin tx Dr.jones  . BCC (basal cell carcinoma) 09/14/2007   right of sternum-Tx-CX26f  . BCC (basal cell carcinoma) 10/20/2012   left post sholder-tx CX35FU  . BCC (basal cell carcinoma) 10/20/2012   left sholder tip-TX- CX35FU  . BCC (basal cell carcinoma) 10/20/2012   left temple-TX CX35FU  . BCC (basal cell carcinoma) 10/20/2012   left outer calf-TX cx351f . BCC (basal cell carcinoma) 10/20/2012   left upper arm-tx [id number]. BCC (basal cell carcinoma) 10/20/2012   right sholder tip-tx [id number] BCC (basal cell carcinoma) 11/10/2013   right upper back-[id number] (basal cell carcinoma) 06/07/2014   left inferior  post sholder-TXpbx  . BCC (basal cell carcinoma) 06/13/2015   righ post sholder-TXpbx  . BCC (basal cell carcinoma) 06/13/2015   left upper ankle  . BCC (basal cell carcinoma) 03/18/2017   right outer back-txpbx  . BCC (basal cell carcinoma) 03/18/2017   left trapezius-txpbx  . BCC (basal cell carcinoma) 03/18/2017   left deltoid-txpbx  . BCC (basal cell carcinoma) 06/05/2017   right rhomboid-txpbx  . BCC (basal cell carcinoma) 04/28/2018   right tragus-txpbx  . BCC (basal cell carcinoma) 06/09/2018   mid upper back txpbx  . BCC (basal cell carcinoma) 03/09/2019   left deltoid inferior txpbx  . BCC (basal cell carcinoma) 03/09/2019   left chest medial txpbx  . CAD (coronary artery disease)    LHC 12/20/2014 showed small to moderate apical infarction due to distal LAD stenosis with reestablished TIMI2 flow. No PCI. Recommended plavix for 1 yr and ASA for life.   . Chicken pox   . Chronic bronchitis (HCC) [place] Family history of breast cancer in first degree relative    sister age 76  .82eart murmur   . Hyperlipidemia   . Hypertension   . Hypothyroidism   . Malignant neoplasm skin of face 02/16/2007   Basal Cell Carcinoma  [place].  . Measles   . Melanoma (HCC) [place]26/2019   left forearm-MIS TX- Excision  . Migraine <1982   "  went away when I got pregnant" (09/25/2017)  . Myocardial infarction ([place]) 01/2015  . Obesity, unspecified   . Other chronic nonalcoholic liver disease   . Pre-diabetes   . Skin cancer 10/12/2004   right anterior shin-CIS-TX Dr.Jones  . Skin cancer 03/10/2013   right lower shin-txpbx CIS  . Skin cancer 01/18/2015   left neck-[place] cx1 + Caut[place]  . Skin cancer 02/28/2015   left neck-excision  . Skin cancer 06/05/2017   right scapula-CIS  . Skin cancer 04/28/2018   left ear helix-well diff scc-txpbx  . Sleep related leg cramps   . Stroke (cerebrum) ([place])    due to large vessel disease; denies residual on 09/25/2017  . Thyroid nodule    10-2016   .  Unspecified vitamin D deficiency   . Vision abnormalities     Past Surgical History:  Procedure Laterality Date  . ATRIAL FIBRILLATION ABLATION  09/25/2017  . ATRIAL FIBRILLATION ABLATION N/A 09/25/2017   Procedure: ATRIAL FIBRILLATION ABLATION;  Surgeon: Thompson Grayer, MD;  Location: [place] [facility];  Service: Cardiovascular;  Laterality: N/A;  . CARDIAC CATHETERIZATION N/A 12/20/2014   Procedure: Left Heart Cath and Coronary Angiography;  Surgeon: Sanda Klein, MD;  Location: [place] [facility];  Service: Cardiovascular;  Laterality: N/A;  . COLONOSCOPY  06/25/2007   [place]. Normal. Repeat in ten years.  . CYSTOSTOMY W/ BLADDER DILATION  1960s   "bladder stem stretched"  . LAPAROSCOPIC CHOLECYSTECTOMY    . LAPAROSCOPIC GASTRIC BANDING  07/03/10   Dr. Sherrin Daisy; [place]  . LOOP RECORDER INSERTION N/A 08/23/2016   Procedure: Loop Recorder Insertion;  Surgeon: Deboraha Sprang, MD;  Location: [place] [facility];  Service: Cardiovascular;  Laterality: N/A;  . SQUAMOUS CELL CARCINOMA EXCISION Left    neck  . THYROIDECTOMY N/A 12/17/2016   Procedure: TOTAL THYROIDECTOMY;  Surgeon: Excell Seltzer, MD;  Location: [facility];  Service: General;  Laterality: N/A;  . TONSILLECTOMY      Current Medications: Current Meds  Medication Sig  . atorvastatin (LIPITOR) 80 MG tablet Take 80 mg by mouth daily.  Marland Kitchen azelastine (ASTELIN) 0.1 % nasal spray Place 2 sprays into both nostrils as needed for rhinitis. Use in each nostril as directed  . B COMPLEX-C-FOLIC ACID PO Take 1 tablet by mouth daily.  . cholecalciferol (VITAMIN D) 400 units TABS tablet Take 2,000 Units by mouth daily.   . CONTOUR NEXT TEST test strip AS DIRECTED  . diltiazem (CARDIZEM CD) 120 MG 24 hr capsule TAKE 1 CAPSULE (120 MG TOTAL) BY MOUTH DAILY AS NEEDED (FOR BREAKTHROUGH AFIB).  Marland Kitchen diltiazem (CARDIZEM) 30 MG tablet TAKE 1 TABLET EVERY 4 HOURS AS NEEDED FOR RAPID AFIB HEART RATE >100  . ELIQUIS 5 MG TABS tablet TAKE 1 TABLET BY  MOUTH TWICE A DAY  . fluticasone (FLONASE) 50 MCG/ACT nasal spray Place 2 sprays into both nostrils daily as needed for allergies.  . furosemide (LASIX) 20 MG tablet Take 20 mg by mouth as needed.  Marland Kitchen levothyroxine (SYNTHROID) 112 MCG tablet Take 112 mcg by mouth daily.  . montelukast (SINGULAIR) 10 MG tablet Take 10 mg by mouth daily.  . potassium chloride SA (K-DUR,KLOR-CON) 20 MEQ tablet Take 20 mEq by mouth as needed. Take with lasix.  Marland Kitchen valsartan (DIOVAN) 80 MG tablet Take 1 tablet (80 mg total) by mouth daily.     Allergies:   Sulfa antibiotics and Zofran [ondansetron hcl]   Social History   Socioeconomic History  . Marital status:  Married    Spouse name: Not on file  . Number of children: 1  . Years of education: Not on file  . Highest education level: Not on file  Occupational History  . Occupation: HAIR STYLIST    Employer: [employer]  Tobacco Use  . Smoking status: Former Smoker    Packs/day: 1.00    Years: 25.00    Pack years: 25.00    Types: Cigarettes    Quit date: 03/26/1995    Years since quitting: 25.6  . Smokeless tobacco: Never Used  Vaping Use  . Vaping Use: Never used  Substance and Sexual Activity  . Alcohol use: Not Currently  . Drug use: No  . Sexual activity: Not Currently    Birth control/protection: Post-menopausal  Other Topics Concern  . Not on file  Social History Narrative   Marital status:  Married; x 27 years. Second marriage. 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      Tobacco:  None      Alcohol: rare/minimal.      Drugs:  None       Exercise:        Seatbelt:  100%.       Sunscreen: SPF 15.      Guns:  Maybe?   Social Determinants of Health   Financial Resource Strain: Not on file  Food Insecurity: Not on file  Transportation Needs: Not on file  Physical Activity: Not  on file  Stress: Not on file  Social Connections: Not on file     Family History: The patient's family history includes Arthritis in her brother; Breast cancer (age of onset: 53) in her sister; Cancer in her sister; Cervical cancer in her sister; Congestive Heart Failure in her father and mother; Dementia in her mother; Diabetes in her brother, father, mother, and sister; Heart disease in her father, maternal grandfather, mother, paternal grandfather, and paternal grandmother; Hepatitis C (age of onset: 56) in her sister; Hyperlipidemia in her brother, father, and mother; Hypertension in her brother, father, mother, sister, and sister; Kidney disease in her father and sister; Kidney failure in her father; Liver cancer (age of onset: 56) in her sister; Stroke in her father, maternal grandmother, paternal grandfather, and paternal grandmother. There is no history of Thyroid disease.  ROS:   Please see the history of present illness.    Denies any syncope bleeding orthopnea PND all other systems reviewed and are negative.  EKGs/Labs/Other Studies Reviewed:    The following studies were reviewed today:  Echocardiogram 2018: - Left ventricle: The cavity size was normal. Wall thickness was  normal. Systolic function was normal. The estimated ejection  fraction was in the range of 60% to 65%. Wall motion was normal;  there were no regional wall motion abnormalities. Doppler  parameters are consistent with abnormal left ventricular  relaxation (grade 1 diastolic dysfunction).  - Aortic valve: There was trivial regurgitation.   Impressions:   - No cardiac source of emboli was indentified.   Cardiac catheterization 2016:  Dist LAD lesion, 90% stenosed.  Apical infarction with preserved overall LVEF   IMPRESSIONS:  Single vessel CAD. Small to moderate apical infarction due to a distal LAD stenosis with reestablished (TIMI2) flow   RECOMMENDATION:  Medical therapy with statin,  lifelong ASA,   dual antiplatelet therapy for 12 months.  Prefer ARB/ACEi for BP control.  Beta blockers would be helpful, but precluded by baseline bradycardia.  Aggressive control of risk factors, especially DM.  Recent Labs: No results found for requested labs within last 8760 hours.  Recent Lipid Panel    Component Value Date/Time   CHOL 147 12/22/2017 0846   TRIG 80 12/22/2017 0846   HDL 60 12/22/2017 0846   CHOLHDL 2.5 12/22/2017 0846   CHOLHDL 2.4 08/21/2016 0147   VLDL 11 08/21/2016 0147   LDLCALC 71 12/22/2017 0846     Risk Assessment/Calculations:      Physical Exam:    VS:  BP 140/70 (BP Location: Left Arm, Patient Position: Sitting, Cuff Size: Normal)   Pulse 64   Ht 5' 2" (1.575 m)   Wt 200 lb (90.7 kg)   SpO2 96%   BMI 36.58 kg/m     Wt Readings from Last 3 Encounters:  11/01/20 200 lb (90.7 kg)  05/23/20 199 lb 9.6 oz (90.5 kg)  10/15/19 201 lb 12.8 oz (91.5 kg)     GEN:  Well nourished, well developed in no acute distress HEENT: Normal NECK: No JVD; No carotid bruits LYMPHATICS: No lymphadenopathy CARDIAC: RRR, no murmurs, rubs, gallops RESPIRATORY:  Clear to auscultation without rales, wheezing or rhonchi  ABDOMEN: Soft, non-tender, non-distended MUSCULOSKELETAL:  No edema; No deformity  SKIN: Warm and dry NEUROLOGIC:  Alert and oriented x 3 PSYCHIATRIC:  Normal affect   ASSESSMENT:    1. Paroxysmal atrial fibrillation (HCC)   2. Secondary hypercoagulable state (HCC)   3. Persistent atrial fibrillation (HCC)   4. Medication management    PLAN:    In order of problems listed above:  Paroxysmal atrial fibrillation - Ablation 2019 Dr. Allred - Maintaining sinus rhythm - Has Cardizem 120 mg daily.  30 mg as needed -Her prior symptoms with atrial fibrillation were Aura that she felt, dry sensation in her throat.  Chronic anticoagulation - On Eliquis 5 mg twice a day, CHA2DS2-VASc score of at least 6.  Stroke in 2018 - No PCI  previously performed.  Eliquis.  Coronary artery disease - Prior MI, no anginal symptoms.  No PCI was performed previously.  Hyperlipidemia - Continue with statin therapy.  Recent LDL from outside labs was 71 hemoglobin A1c 5.9 ALT 18 creatinine 1.0  Checking CBC, complete metabolic profile, lipid panel today.  1 year follow-up     Medication Adjustments/Labs and Tests Ordered: Current medicines are reviewed at length with the patient today.  Concerns regarding medicines are outlined above.  Orders Placed This Encounter  Procedures  . CBC  . Lipid Profile  . Comp Met (CMET)   No orders of the defined types were placed in this encounter.   Patient Instructions  Medication Instructions:  Your physician recommends that you continue on your current medications as directed. Please refer to the Current Medication list given to you today.  *If you need a refill on your cardiac medications before your next appointment, please call your pharmacy*   Lab Work: CMET, CBC, LIPID TODAY  If you have labs (blood work) drawn today and your tests are completely normal, you will receive your results only by: . MyChart Message (if you have MyChart) OR . A paper copy in the mail If you have any lab test that is abnormal or we need to change your treatment, we will call you to review the results.   Testing/Procedures: NONE   Follow-Up: At   [facility], you and your health needs are our priority.  As part of our continuing mission to provide you with exceptional heart care, we have created designated Provider Care Teams.  These Care Teams include your primary Cardiologist (physician) and Advanced Practice Providers (APPs -  Physician Assistants and Nurse Practitioners) who all work together to provide you with the care you need, when you need it.  We recommend signing up for the patient portal called "MyChart".  Sign up information is provided on this After Visit Summary.  MyChart is used  to connect with patients for Virtual Visits (Telemedicine).  Patients are able to view lab/test results, encounter notes, upcoming appointments, etc.  Non-urgent messages can be sent to your provider as well.   To learn more about what you can do with MyChart, go to [email or web address].    Your next appointment:   1 year(s)  The format for your next appointment:   In Person  Provider:   Mark Skains, MD   Other Instructions      Signed, Mark Skains, MD  11/01/2020 9:24 AM    [facility] 

## 2021-01-02 ENCOUNTER — Other Ambulatory Visit: Payer: Self-pay | Admitting: Physician Assistant

## 2021-01-02 ENCOUNTER — Encounter: Payer: Self-pay | Admitting: Physician Assistant

## 2021-01-02 ENCOUNTER — Other Ambulatory Visit: Payer: Self-pay

## 2021-01-02 ENCOUNTER — Ambulatory Visit (INDEPENDENT_AMBULATORY_CARE_PROVIDER_SITE_OTHER): Payer: Medicare Other | Admitting: Physician Assistant

## 2021-01-02 DIAGNOSIS — L820 Inflamed seborrheic keratosis: Secondary | ICD-10-CM | POA: Diagnosis not present

## 2021-01-02 DIAGNOSIS — D0439 Carcinoma in situ of skin of other parts of face: Secondary | ICD-10-CM | POA: Diagnosis not present

## 2021-01-02 DIAGNOSIS — Z1283 Encounter for screening for malignant neoplasm of skin: Secondary | ICD-10-CM | POA: Diagnosis not present

## 2021-01-02 DIAGNOSIS — C4492 Squamous cell carcinoma of skin, unspecified: Secondary | ICD-10-CM

## 2021-01-02 DIAGNOSIS — D0472 Carcinoma in situ of skin of left lower limb, including hip: Secondary | ICD-10-CM

## 2021-01-02 DIAGNOSIS — Z85828 Personal history of other malignant neoplasm of skin: Secondary | ICD-10-CM

## 2021-01-02 DIAGNOSIS — L570 Actinic keratosis: Secondary | ICD-10-CM | POA: Diagnosis not present

## 2021-01-02 DIAGNOSIS — D485 Neoplasm of uncertain behavior of skin: Secondary | ICD-10-CM | POA: Diagnosis not present

## 2021-01-02 DIAGNOSIS — Z85820 Personal history of malignant melanoma of skin: Secondary | ICD-10-CM | POA: Diagnosis not present

## 2021-01-02 HISTORY — DX: Squamous cell carcinoma of skin, unspecified: C44.92

## 2021-01-02 NOTE — Progress Notes (Signed)
Follow-Up Visit   Subjective  Elaine Jackson is a 65 y.o. female who presents for the following: Annual Exam (New lesionLeft preauricular- x months- "crusty", Right lower leg-new itchy lesion, lesion on back -itchy. Personal history of melanoma and non mole skin cancers. ).   The following portions of the chart were reviewed this encounter and updated as appropriate:  Tobacco  Allergies  Meds  Problems  Med Hx  Surg Hx  Fam Hx       Objective  Well appearing patient in no apparent distress; mood and affect are within normal limits.  A full examination was performed including scalp, head, eyes, ears, nose, lips, neck, chest, axillae, abdomen, back, buttocks, bilateral upper extremities, bilateral lower extremities, hands, feet, fingers, toes, fingernails, and toenails. All findings within normal limits unless otherwise noted below.  Left Upper Back (2), right cheek, left preauricular, left side of chest (4) Erythematous patches with gritty scale.  Left Lower Leg - Anterior       Left Submandibular Area     Left Preauricular Area Right cheek  Assessment & Plan  AK (actinic keratosis) (6) Left Upper Back (2); right cheek, left preauricular, left side of chest (4)  Destruction of lesion - Left Upper Back, right cheek, left preauricular, left side of chest Complexity: simple   Destruction method: cryotherapy   Informed consent: discussed and consent obtained   Timeout:  patient name, date of birth, surgical site, and procedure verified Lesion destroyed using liquid nitrogen: Yes   Cryotherapy cycles:  1 Outcome: patient tolerated procedure well with no complications   Post-procedure details: wound care instructions given    Neoplasm of uncertain behavior of skin (3) Left Lower Leg - Anterior  Skin / nail biopsy Type of biopsy: tangential   Informed consent: discussed and consent obtained   Timeout: patient name, date of birth, surgical site, and procedure  verified   Procedure prep:  Patient was prepped and draped in usual sterile fashion (Non sterile) Prep type:  Chlorhexidine Anesthesia: the lesion was anesthetized in a standard fashion   Anesthetic:  1% lidocaine w/ epinephrine 1-100,000 local infiltration Instrument used: flexible razor blade   Outcome: patient tolerated procedure well   Post-procedure details: wound care instructions given    Specimen 1 - Surgical pathology Differential Diagnosis: bcc vs scc  Check Margins: No  Left Submandibular Area  Skin / nail biopsy Type of biopsy: tangential   Informed consent: discussed and consent obtained   Timeout: patient name, date of birth, surgical site, and procedure verified   Procedure prep:  Patient was prepped and draped in usual sterile fashion (Non sterile) Prep type:  Chlorhexidine Anesthesia: the lesion was anesthetized in a standard fashion   Anesthetic:  1% lidocaine w/ epinephrine 1-100,000 local infiltration Instrument used: flexible razor blade   Outcome: patient tolerated procedure well   Post-procedure details: wound care instructions given    Specimen 2 - Surgical pathology Differential Diagnosis: bcc vs scc  Check Margins: No  Right Zygomatic Area  Skin / nail biopsy Type of biopsy: tangential   Informed consent: discussed and consent obtained   Timeout: patient name, date of birth, surgical site, and procedure verified   Procedure prep:  Patient was prepped and draped in usual sterile fashion (Non sterile) Prep type:  Chlorhexidine Anesthesia: the lesion was anesthetized in a standard fashion   Anesthetic:  1% lidocaine w/ epinephrine 1-100,000 local infiltration Instrument used: flexible razor blade   Outcome: patient tolerated procedure  well   Post-procedure details: wound care instructions given    Specimen 3 - Surgical pathology Differential Diagnosis: bcc vs scc  Check Margins: No  Inflamed seborrheic keratosis Left Preauricular  Area  Destruction of lesion - Left Preauricular Area Complexity: simple   Destruction method: cryotherapy   Informed consent: discussed and consent obtained   Timeout:  patient name, date of birth, surgical site, and procedure verified Lesion destroyed using liquid nitrogen: Yes   Cryotherapy cycles:  3 Outcome: patient tolerated procedure well with no complications     I, Nicole Hafley, PA-C, have reviewed all documentation's for this visit.  The documentation on 01/02/21 for the exam, diagnosis, procedures and orders are all accurate and complete.

## 2021-01-02 NOTE — Patient Instructions (Signed)

## 2021-01-09 NOTE — Progress Notes (Signed)
30min

## 2021-01-10 ENCOUNTER — Telehealth: Payer: Self-pay | Admitting: *Deleted

## 2021-01-10 ENCOUNTER — Telehealth: Payer: Self-pay

## 2021-01-10 NOTE — Telephone Encounter (Signed)
Phone call from patient returning our call. Pathology results given to the patient.

## 2021-01-10 NOTE — Telephone Encounter (Signed)
Left message for patient to call back for pathology results.

## 2021-01-10 NOTE — Telephone Encounter (Signed)
-----   Message from Warren Danes, [place] sent at 01/09/2021  4:30 PM EDT ----- 30 min

## 2021-01-10 NOTE — Progress Notes (Signed)
lmtc

## 2021-01-15 ENCOUNTER — Other Ambulatory Visit: Payer: Self-pay | Admitting: Family Medicine

## 2021-01-15 DIAGNOSIS — Z1231 Encounter for screening mammogram for malignant neoplasm of breast: Secondary | ICD-10-CM

## 2021-02-09 ENCOUNTER — Other Ambulatory Visit: Payer: Self-pay

## 2021-02-09 ENCOUNTER — Ambulatory Visit
Admission: RE | Admit: 2021-02-09 | Discharge: 2021-02-09 | Disposition: A | Discharge status: Home / Self Care | Payer: Medicare Other | Source: Ambulatory Visit | Attending: Family Medicine | Admitting: Family Medicine

## 2021-02-09 DIAGNOSIS — Z1231 Encounter for screening mammogram for malignant neoplasm of breast: Secondary | ICD-10-CM

## 2021-02-14 ENCOUNTER — Other Ambulatory Visit: Payer: Self-pay | Admitting: Family Medicine

## 2021-02-14 DIAGNOSIS — R928 Other abnormal and inconclusive findings on diagnostic imaging of breast: Secondary | ICD-10-CM

## 2021-03-02 ENCOUNTER — Ambulatory Visit
Admission: RE | Admit: 2021-03-02 | Discharge: 2021-03-02 | Disposition: A | Discharge status: Home / Self Care | Payer: Medicare Other | Source: Ambulatory Visit | Attending: Family Medicine | Admitting: Family Medicine

## 2021-03-02 ENCOUNTER — Other Ambulatory Visit: Payer: Self-pay | Admitting: Family Medicine

## 2021-03-02 ENCOUNTER — Other Ambulatory Visit: Payer: Self-pay

## 2021-03-02 DIAGNOSIS — R928 Other abnormal and inconclusive findings on diagnostic imaging of breast: Secondary | ICD-10-CM

## 2021-03-02 DIAGNOSIS — N6489 Other specified disorders of breast: Secondary | ICD-10-CM

## 2021-03-07 ENCOUNTER — Encounter: Payer: Self-pay | Admitting: Physician Assistant

## 2021-03-07 ENCOUNTER — Ambulatory Visit (INDEPENDENT_AMBULATORY_CARE_PROVIDER_SITE_OTHER): Payer: Medicare Other | Admitting: Physician Assistant

## 2021-03-07 ENCOUNTER — Other Ambulatory Visit: Payer: Self-pay

## 2021-03-07 DIAGNOSIS — D044 Carcinoma in situ of skin of scalp and neck: Secondary | ICD-10-CM

## 2021-03-07 DIAGNOSIS — D0430 Carcinoma in situ of skin of unspecified part of face: Secondary | ICD-10-CM

## 2021-03-07 DIAGNOSIS — D0472 Carcinoma in situ of skin of left lower limb, including hip: Secondary | ICD-10-CM

## 2021-03-07 NOTE — Progress Notes (Signed)
   Follow-Up Visit   Subjective  Elaine Jackson is a 65 y.o. female who presents for the following: Procedure (Cis x 2).   The following portions of the chart were reviewed this encounter and updated as appropriate:  Tobacco  Allergies  Meds  Problems  Med Hx  Surg Hx  Fam Hx      Objective  Well appearing patient in no apparent distress; mood and affect are within normal limits.  A focused examination was performed including left leg, face and neck. Relevant physical exam findings are noted in the Assessment and Plan.  Left Lower Leg - Anterior Pink macule  Left submandibular area Pink macule   Assessment & Plan  Carcinoma in situ of skin of left lower extremity including hip Left Lower Leg - Anterior  Destruction of lesion Complexity: simple   Destruction method: electrodesiccation and curettage   Informed consent: discussed and consent obtained   Timeout:  patient name, date of birth, surgical site, and procedure verified Anesthesia: the lesion was anesthetized in a standard fashion   Anesthetic:  1% lidocaine w/ epinephrine 1-100,000 local infiltration Curettage performed in three different directions: Yes   Electrodesiccation performed over the curetted area: Yes   Curettage cycles:  3 Final wound size (cm):  2.3 Hemostasis achieved with:  ferric subsulfate Outcome: patient tolerated procedure well with no complications   Post-procedure details: sterile dressing applied   Additional details:  Wound innoculated with 5 fluorouracil solution.  Carcinoma in situ of skin of face, unspecified location Left submandibular area  Destruction of lesion Complexity: simple   Destruction method: electrodesiccation and curettage   Informed consent: discussed and consent obtained   Timeout:  patient name, date of birth, surgical site, and procedure verified Anesthesia: the lesion was anesthetized in a standard fashion   Anesthetic:  1% lidocaine w/ epinephrine 1-100,000  local infiltration Curettage performed in three different directions: Yes   Electrodesiccation performed over the curetted area: Yes   Curettage cycles:  3 Final wound size (cm):  1.2 Hemostasis achieved with:  ferric subsulfate Outcome: patient tolerated procedure well with no complications   Additional details:  Wound innoculated with 5 fluorouracil solution.    I, Shelisa Fern, PA-C, have reviewed all documentation's for this visit.  The documentation on 03/07/21 for the exam, diagnosis, procedures and orders are all accurate and complete.

## 2021-03-07 NOTE — Patient Instructions (Signed)

## 2021-03-15 ENCOUNTER — Other Ambulatory Visit: Payer: Self-pay | Admitting: Nurse Practitioner

## 2021-05-02 ENCOUNTER — Encounter (HOSPITAL_COMMUNITY): Payer: Self-pay | Admitting: Nurse Practitioner

## 2021-05-02 ENCOUNTER — Other Ambulatory Visit: Payer: Self-pay

## 2021-05-02 ENCOUNTER — Ambulatory Visit (HOSPITAL_COMMUNITY)
Admission: RE | Admit: 2021-05-02 | Discharge: 2021-05-02 | Disposition: A | Discharge status: Home / Self Care | Payer: Medicare Other | Source: Ambulatory Visit | Attending: Nurse Practitioner | Admitting: Nurse Practitioner

## 2021-05-02 VITALS — BP 160/82 | HR 54 | Ht 62.0 in | Wt 200.4 lb

## 2021-05-02 DIAGNOSIS — Z87891 Personal history of nicotine dependence: Secondary | ICD-10-CM | POA: Diagnosis not present

## 2021-05-02 DIAGNOSIS — I480 Paroxysmal atrial fibrillation: Secondary | ICD-10-CM | POA: Diagnosis not present

## 2021-05-02 DIAGNOSIS — Z7901 Long term (current) use of anticoagulants: Secondary | ICD-10-CM | POA: Diagnosis not present

## 2021-05-02 DIAGNOSIS — Z79899 Other long term (current) drug therapy: Secondary | ICD-10-CM | POA: Insufficient documentation

## 2021-05-02 DIAGNOSIS — I10 Essential (primary) hypertension: Secondary | ICD-10-CM | POA: Diagnosis not present

## 2021-05-02 DIAGNOSIS — Z8673 Personal history of transient ischemic attack (TIA), and cerebral infarction without residual deficits: Secondary | ICD-10-CM | POA: Diagnosis not present

## 2021-05-02 DIAGNOSIS — D6869 Other thrombophilia: Secondary | ICD-10-CM | POA: Diagnosis not present

## 2021-05-02 DIAGNOSIS — Z09 Encounter for follow-up examination after completed treatment for conditions other than malignant neoplasm: Secondary | ICD-10-CM | POA: Diagnosis not present

## 2021-05-02 DIAGNOSIS — I2510 Atherosclerotic heart disease of native coronary artery without angina pectoris: Secondary | ICD-10-CM | POA: Diagnosis not present

## 2021-05-02 NOTE — Progress Notes (Signed)
Primary Care Physician: [name], MD Referring Physician: Dr. Royetta Crochet is a 65 y.o. female with a h/o afib,CAD, HTN, previous CVA, s/p ablation, 09/2017.  She had  done well until she developed afib in the setting of steriod shot and taper for an injured foot. Unfortunately, this caused her BP to elevate and then onset of afib. She saw Dr. Rayann Heman  and placed on daily Cardizem.     F/u in afib clinic 05/23/20 She feels well. Just one episode of afib that resolved within 30 mins taking cardizem.  No issues with eliquis with a CHA2DS2VASc score of 6. Recent labs with PCP show normal BMET and CBC.   F/u in afib clinic, 05/02/21. Reports that afib remains quiet. She is in SR today.  Describes what sounds like intermittent PAC's.  Continues on eliquis without bleeding issues.   Today, she denies symptoms of palpitations, chest pain, shortness of breath, orthopnea, PND, lower extremity edema, dizziness, presyncope, syncope, or neurologic sequela. The patient is tolerating medications without difficulties and is otherwise without complaint today.   Past Medical History:  Diagnosis Date   Allergic rhinitis, cause unspecified    Allergy    Basal cell carcinoma 10/04/1996   right chest-TX-DR.Turner   BCC (basal cell carcinoma) 12/05/2000   upper mid back TX DR.Jones   BCC (basal cell carcinoma) 12/05/2000   Left upper back TX Dr. Ronnald Ramp   [facility] (basal cell carcinoma) 09/16/2001   left upper back TX Dr.Jones   BCC (basal cell carcinoma) 08/15/2003   left upper mid back T Dr.Jones   BCC (basal cell carcinoma) 08/15/2003   right upper mid back Dr. Ronnald Ramp   [facility] (basal cell carcinoma) 08/15/2003   left medial breast TX Dr.jones   BCC (basal cell carcinoma) 10/12/2004   left anterior shin tx Dr.jones   BCC (basal cell carcinoma) 09/14/2007   right of sternum-Tx-CX13fu   BCC (basal cell carcinoma) 10/20/2012   left post sholder-tx CX35FU   BCC (basal cell carcinoma) 10/20/2012    left sholder tip-TX- CX35FU   BCC (basal cell carcinoma) 10/20/2012   left temple-TX CX35FU   BCC (basal cell carcinoma) 10/20/2012   left outer calf-TX cx22fu   BCC (basal cell carcinoma) 10/20/2012   left upper arm-tx cx66fu   BCC (basal cell carcinoma) 10/20/2012   right sholder tip-tx cx42fu   BCC (basal cell carcinoma) 11/10/2013   right upper back-cx30fu   BCC (basal cell carcinoma) 06/07/2014   left inferior post sholder-TXpbx   BCC (basal cell carcinoma) 06/13/2015   righ post sholder-TXpbx   BCC (basal cell carcinoma) 06/13/2015   left upper ankle   BCC (basal cell carcinoma) 03/18/2017   right outer back-txpbx   BCC (basal cell carcinoma) 03/18/2017   left trapezius-txpbx   BCC (basal cell carcinoma) 03/18/2017   left deltoid-txpbx   BCC (basal cell carcinoma) 06/05/2017   right rhomboid-txpbx   BCC (basal cell carcinoma) 04/28/2018   right tragus-txpbx   BCC (basal cell carcinoma) 06/09/2018   mid upper back txpbx   BCC (basal cell carcinoma) 03/09/2019   left deltoid inferior txpbx   BCC (basal cell carcinoma) 03/09/2019   left chest medial txpbx   CAD (coronary artery disease)    LHC 12/20/2014 showed small to moderate apical infarction due to distal LAD stenosis with reestablished TIMI2 flow. No PCI. Recommended plavix for 1 yr and ASA for life.      Chicken pox    Chronic  bronchitis ([place])    Family history of breast cancer in first degree relative    sister age 55   Heart murmur    Hyperlipidemia    Hypertension    Hypothyroidism    Malignant neoplasm skin of face 02/16/2007   Basal Cell Carcinoma  [place].   Measles    Melanoma ([place]) 12/17/2017   left forearm-MIS TX- Excision   Migraine <1982   "went away when I got pregnant" (09/25/2017)   Myocardial infarction ([place]) 01/2015   Obesity, unspecified    Other chronic nonalcoholic liver disease    Pre-diabetes    SCCA (squamous cell carcinoma) of skin 01/02/2021   Left Lower Leg - Anterior (in situ) tx  cx3 66fu   SCCA (squamous cell carcinoma) of skin 01/02/2021   Left Submandibular Area (in situ) tx cx 3 77fu   Skin cancer 10/12/2004   right anterior shin-CIS-TX Dr.Jones   Skin cancer 03/10/2013   right lower shin-txpbx CIS   Skin cancer 01/18/2015   left neck-KA cx1 + Cautery   Skin cancer 02/28/2015   left neck-excision   Skin cancer 06/05/2017   right scapula-CIS   Skin cancer 04/28/2018   left ear helix-well diff scc-txpbx   Sleep related leg cramps    Stroke (cerebrum) ([place])    due to large vessel disease; denies residual on 09/25/2017   Thyroid nodule    10-2016    Unspecified vitamin D deficiency    Vision abnormalities    Past Surgical History:  Procedure Laterality Date   ATRIAL FIBRILLATION ABLATION  09/25/2017   ATRIAL FIBRILLATION ABLATION N/A 09/25/2017   Procedure: ATRIAL FIBRILLATION ABLATION;  Surgeon: Thompson Grayer, MD;  Location: [place] [facility];  Service: Cardiovascular;  Laterality: N/A;   CARDIAC CATHETERIZATION N/A 12/20/2014   Procedure: Left Heart Cath and Coronary Angiography;  Surgeon: Sanda Klein, MD;  Location: [place] [facility];  Service: Cardiovascular;  Laterality: N/A;   COLONOSCOPY  06/25/2007   [place]. Normal. Repeat in ten years.   CYSTOSTOMY W/ BLADDER DILATION  1960s   "bladder stem stretched"   LAPAROSCOPIC CHOLECYSTECTOMY     LAPAROSCOPIC GASTRIC BANDING  07/03/10   Dr. Sherrin Daisy; [place]   LOOP RECORDER INSERTION N/A 08/23/2016   Procedure: Loop Recorder Insertion;  Surgeon: Deboraha Sprang, MD;  Location: [place] [facility];  Service: Cardiovascular;  Laterality: N/A;   SQUAMOUS CELL CARCINOMA EXCISION Left    neck   THYROIDECTOMY N/A 12/17/2016   Procedure: TOTAL THYROIDECTOMY;  Surgeon: Excell Seltzer, MD;  Location: [facility];  Service: General;  Laterality: N/A;   TONSILLECTOMY      Current Outpatient Medications  Medication Sig Dispense Refill   atorvastatin (LIPITOR) 80 MG tablet Take 80 mg by mouth daily.     azelastine  (ASTELIN) 0.1 % nasal spray Place 2 sprays into both nostrils as needed for rhinitis. Use in each nostril as directed     B COMPLEX-C-FOLIC ACID PO Take 1 tablet by mouth daily.     cholecalciferol (VITAMIN D) 400 units TABS tablet Take 2,000 Units by mouth daily.      CONTOUR NEXT TEST test strip AS DIRECTED 100 each 4   diltiazem (CARDIZEM CD) 120 MG 24 hr capsule TAKE 1 CAPSULE (120 MG TOTAL) BY MOUTH DAILY AS NEEDED (FOR BREAKTHROUGH AFIB). 90 capsule 1   diltiazem (CARDIZEM) 30 MG tablet TAKE 1 TABLET EVERY 4 HOURS AS NEEDED FOR RAPID AFIB HEART RATE >100 45 tablet 2   ELIQUIS  5 MG TABS tablet TAKE 1 TABLET BY MOUTH TWICE A DAY 180 tablet 2   fluticasone (FLONASE) 50 MCG/ACT nasal spray Place 2 sprays into both nostrils daily as needed for allergies. 48 g 3   furosemide (LASIX) 20 MG tablet Take 20 mg by mouth as needed.     levothyroxine (SYNTHROID) 112 MCG tablet Take 112 mcg by mouth daily.     montelukast (SINGULAIR) 10 MG tablet Take 10 mg by mouth daily.     potassium chloride SA (K-DUR,KLOR-CON) 20 MEQ tablet Take 20 mEq by mouth as needed. Take with lasix.     valsartan (DIOVAN) 80 MG tablet Take 1 tablet (80 mg total) by mouth daily. 90 tablet 3   No current facility-administered medications for this encounter.    Allergies  Allergen Reactions   Sulfa Antibiotics Nausea And Vomiting   Zofran [Ondansetron Hcl] Other (See Comments)    Oral numbness with IV dosing.     Social History   Socioeconomic History   Marital status: Married    Spouse name: Not on file   Number of children: 1   Years of education: Not on file   Highest education level: Not on file  Occupational History   Occupation: HAIR STYLIST    Employer: [employer]  Tobacco Use   Smoking status: Former    Packs/day: 1.00    Years: 25.00    Pack years: 25.00    Types: Cigarettes    Quit date: 03/26/1995    Years since quitting: 26.1   Smokeless tobacco: Never  Vaping Use   Vaping Use: Never used   Substance and Sexual Activity   Alcohol use: Not Currently   Drug use: No   Sexual activity: Not Currently    Birth control/protection: Post-menopausal  Other Topics Concern   Not on file  Social History Narrative   Marital status:  Married; x 27 years. Second marriage. 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      Tobacco:  None      Alcohol: rare/minimal.      Drugs:  None       Exercise:        Seatbelt:  100%.       Sunscreen: SPF 15.      Guns:  Maybe?   Social Determinants of Health   Financial Resource Strain: Not on file  Food Insecurity: Not on file  Transportation Needs: Not on file  Physical Activity: Not on file  Stress: Not on file  Social Connections: Not on file  Intimate Partner Violence: Not on file    Family History  Problem Relation Age of Onset   Diabetes Mother    Heart disease Mother        defibrillator; carotid artery stenosis.   Hypertension Mother    Hyperlipidemia Mother    Dementia Mother    Congestive Heart Failure Mother    Heart disease Father    Kidney failure Father        ESRD/peritoneal dialysis   Diabetes Father    Hypertension Father    Hyperlipidemia Father    Stroke Father    Kidney disease Father    Congestive Heart Failure Father    Diabetes Sister    Hypertension Sister    Kidney disease  Sister        renal failure   Diabetes Brother    Hypertension Brother    Hyperlipidemia Brother    Arthritis Brother    Cervical cancer Sister    Hypertension Sister    Liver cancer Sister 70   Cancer Sister        Liver cancer   Stroke Maternal Grandmother    Heart disease Maternal Grandfather    Stroke Paternal Grandmother    Heart disease Paternal Grandmother    Heart disease Paternal Grandfather    Stroke Paternal Grandfather    Breast cancer Sister 18   Hepatitis  C Sister 75   Thyroid disease Neg Hx     ROS- All systems are reviewed and negative except as per the HPI above  Physical Exam: Vitals:   05/02/21 0824  Weight: 90.9 kg  Height: 5\' 2"  (1.575 m)   Wt Readings from Last 3 Encounters:  05/02/21 90.9 kg  11/01/20 90.7 kg  05/23/20 90.5 kg    Labs: Lab Results  Component Value Date   NA 142 11/01/2020   K 4.8 11/01/2020   CL 101 11/01/2020   CO2 26 11/01/2020   GLUCOSE 109 (H) 11/01/2020   BUN 18 11/01/2020   CREATININE 0.89 11/01/2020   CALCIUM 10.0 11/01/2020   Lab Results  Component Value Date   INR 0.97 08/20/2016   Lab Results  Component Value Date   CHOL 136 11/01/2020   HDL 50 11/01/2020   LDLCALC 70 11/01/2020   TRIG 83 11/01/2020     GEN- The patient is well appearing, alert and oriented x 3 today.   Head- normocephalic, atraumatic Eyes-  Sclera clear, conjunctiva pink Ears- hearing intact Oropharynx- clear Neck- supple, no JVP Lymph- no cervical lymphadenopathy Lungs- Clear to ausculation bilaterally, normal work of breathing Heart- Regular rate and rhythm, no murmurs, rubs or gallops, PMI not laterally displaced GI- soft, NT, ND, + BS Extremities- no clubbing, cyanosis, or edema MS- no significant deformity or atrophy Skin- no rash or lesion Psych- euthymic mood, full affect Neuro- strength and sensation are intact  EKG-Sinus brady at 54 bpm, pr int 146 ms, qrs int 80 ms, qt 394 ms  Epic records reviewed   Assessment and Plan:  1. Paroxysmal afib  S/p ablation 09/2017 and staying in SR    Has daily cardizem 120 mg  as needed as afib has been quiet Describes what sounds like intermittent PAC's, monitor offered but pt declined  Has 30 mg cardizem to use prn as well  Continue Eliquis 5 mg bid for chadsvasc score of at least 6  2. HTN Elevated on presentation but on recheck 140/90 Encouraged to check at home   3. CAD No anginal c/o's Per Dr. Marlou Porch   F/u in afib clinic as needed    Geroge Baseman. Jaceon Heiberger, [facility] [address] [place], [place] [address] [phone]

## 2021-05-04 ENCOUNTER — Other Ambulatory Visit (HOSPITAL_COMMUNITY): Payer: Self-pay | Admitting: *Deleted

## 2021-05-04 ENCOUNTER — Other Ambulatory Visit: Payer: Self-pay | Admitting: Family Medicine

## 2021-05-04 ENCOUNTER — Ambulatory Visit (HOSPITAL_COMMUNITY)
Admission: RE | Admit: 2021-05-04 | Discharge: 2021-05-04 | Disposition: A | Discharge status: Home / Self Care | Payer: Medicare Other | Source: Ambulatory Visit | Attending: Nurse Practitioner | Admitting: Nurse Practitioner

## 2021-05-04 DIAGNOSIS — R002 Palpitations: Secondary | ICD-10-CM

## 2021-05-04 DIAGNOSIS — Z780 Asymptomatic menopausal state: Secondary | ICD-10-CM

## 2021-05-23 ENCOUNTER — Encounter (HOSPITAL_COMMUNITY): Payer: Self-pay | Admitting: *Deleted

## 2021-05-31 ENCOUNTER — Telehealth: Payer: Self-pay | Admitting: Cardiology

## 2021-05-31 NOTE — Telephone Encounter (Signed)
Patient c/o Palpitations:  High priority if patient c/o lightheadedness, shortness of breath, or chest pain  How long have you had palpitations/irregular HR/ Afib? Are you having the symptoms now? 3 weeks.. yes  Are you currently experiencing lightheadedness, SOB or CP? no  Do you have a history of afib (atrial fibrillation) or irregular heart rhythm? yes  Have you checked your BP or HR? (document readings if available): no  Are you experiencing any other symptoms? Nausea

## 2021-05-31 NOTE — Telephone Encounter (Signed)
Spoke with patient who is reporting having episodes where she feels "whoosy and then has a fluttering in the center of her chest that lasts about 1 minute.'  This has been occurring for the past 3 to 4 weeks, sometimes 6 or 7 times in a day.  She was seen recently in the [facility], had an EKG and wore a monitor for 3 days.  She had no s/s while wearing the monitor. HR is normally in the 50-60 range. Reports BP has been "good."  She denies any other s/s but the fluttering is very random and bothersome when it occurs.  Advised I will have Dr Marlou Porch to review her information and she will be called back with further instructions or orders.  Pt was appreciative of the call.

## 2021-06-04 NOTE — Telephone Encounter (Signed)
Per pt - she does not take Cardizem on a daily basis.  She only takes it as needed for At Fib break through.  Pt states Butch Penny told her she is having "premature beats" and not At Fib.

## 2021-06-05 NOTE — Telephone Encounter (Signed)
Elaine Pain, MD  You 4 hours ago (7:15 AM)   Let's see if she will take cardizem CD daily to help with symptoms.    Pt is aware and states she will start Cardizem CD 120 mg daily - tomorrow.  She will c/b if any further questions or concerns.

## 2021-06-07 ENCOUNTER — Other Ambulatory Visit: Payer: Self-pay | Admitting: Cardiology

## 2021-06-08 ENCOUNTER — Other Ambulatory Visit (HOSPITAL_COMMUNITY): Payer: Self-pay | Admitting: Nurse Practitioner

## 2021-06-11 ENCOUNTER — Other Ambulatory Visit (HOSPITAL_COMMUNITY): Payer: Self-pay

## 2021-06-11 MED ORDER — DILTIAZEM HCL 30 MG PO TABS: ORAL_TABLET | ORAL | 2 refills | Status: DC

## 2021-06-14 ENCOUNTER — Ambulatory Visit
Admission: RE | Admit: 2021-06-14 | Discharge: 2021-06-14 | Disposition: A | Discharge status: Home / Self Care | Payer: Medicare Other | Source: Ambulatory Visit | Attending: Family Medicine | Admitting: Family Medicine

## 2021-06-14 DIAGNOSIS — Z780 Asymptomatic menopausal state: Secondary | ICD-10-CM

## 2021-07-10 ENCOUNTER — Other Ambulatory Visit: Payer: Self-pay

## 2021-07-10 ENCOUNTER — Encounter: Payer: Self-pay | Admitting: Physician Assistant

## 2021-07-10 ENCOUNTER — Ambulatory Visit: Payer: Medicare Other | Admitting: Physician Assistant

## 2021-07-10 DIAGNOSIS — B070 Plantar wart: Secondary | ICD-10-CM

## 2021-07-10 DIAGNOSIS — C44719 Basal cell carcinoma of skin of left lower limb, including hip: Secondary | ICD-10-CM

## 2021-07-10 DIAGNOSIS — L570 Actinic keratosis: Secondary | ICD-10-CM | POA: Diagnosis not present

## 2021-07-10 DIAGNOSIS — C44619 Basal cell carcinoma of skin of left upper limb, including shoulder: Secondary | ICD-10-CM | POA: Diagnosis not present

## 2021-07-10 DIAGNOSIS — Z85820 Personal history of malignant melanoma of skin: Secondary | ICD-10-CM | POA: Diagnosis not present

## 2021-07-10 DIAGNOSIS — D485 Neoplasm of uncertain behavior of skin: Secondary | ICD-10-CM

## 2021-07-10 DIAGNOSIS — C44712 Basal cell carcinoma of skin of right lower limb, including hip: Secondary | ICD-10-CM | POA: Diagnosis not present

## 2021-07-10 DIAGNOSIS — Z1283 Encounter for screening for malignant neoplasm of skin: Secondary | ICD-10-CM | POA: Diagnosis not present

## 2021-07-10 DIAGNOSIS — D0471 Carcinoma in situ of skin of right lower limb, including hip: Secondary | ICD-10-CM | POA: Diagnosis not present

## 2021-07-10 NOTE — Patient Instructions (Signed)

## 2021-07-10 NOTE — Progress Notes (Addendum)
Follow-Up Visit   Subjective  Elaine Jackson is a 66 y.o. female who presents for the following: Annual Exam (Pt here for annual. Pt has spots of concern on the back and b/l legs. Pt has personal hx of melanoma). Plantar warts are growing and becoming painful.   The following portions of the chart were reviewed this encounter and updated as appropriate:  Tobacco   Allergies   Meds   Problems   Med Hx   Surg Hx   Fam Hx       Objective  Well appearing patient in no apparent distress; mood and affect are within normal limits.  A full examination was performed including scalp, head, eyes, ears, nose, lips, neck, chest, axillae, abdomen, back, buttocks, bilateral upper extremities, bilateral lower extremities, hands, feet, fingers, toes, fingernails, and toenails. All findings within normal limits unless otherwise noted below.  Upper Back (2) Erythematous patches with gritty scale.  Right Thigh - Anterior Bichromic dark nested macule.        Right Inner Lower Leg Hyperkeratotic scale with pink base        Left 5th Metatarsal Plantar Area, Left Medial Plantar Surface Verrucous papules -- Discussed viral etiology and contagion.   Left Outer Thigh Hyperkeratotic scale with pink base        Left Deltoid Hyperkeratotic scale with pink base         Assessment & Plan  Actinic keratosis (2) Upper Back  Destruction of lesion - Upper Back Complexity: simple   Destruction method: cryotherapy   Informed consent: discussed and consent obtained   Timeout:  patient name, date of birth, surgical site, and procedure verified Lesion destroyed using liquid nitrogen: Yes   Cryotherapy cycles:  3 Outcome: patient tolerated procedure well with no complications   Post-procedure details: wound care instructions given    Neoplasm of uncertain behavior of skin (2) Right Thigh - Anterior  Skin / nail biopsy Type of biopsy: tangential   Informed consent: discussed and  consent obtained   Timeout: patient name, date of birth, surgical site, and procedure verified   Anesthesia: the lesion was anesthetized in a standard fashion   Anesthetic:  1% lidocaine w/ epinephrine 1-100,000 local infiltration Instrument used: flexible razor blade   Hemostasis achieved with: aluminum chloride and electrodesiccation   Outcome: patient tolerated procedure well   Post-procedure details: sterile dressing applied and wound care instructions given   Dressing type: bandage and petrolatum    Specimen 1 - Surgical pathology Differential Diagnosis: R/O BCC vs SCC - cautery  Check Margins: Yes  Right Inner Lower Leg  Skin / nail biopsy Type of biopsy: tangential   Informed consent: discussed and consent obtained   Timeout: patient name, date of birth, surgical site, and procedure verified   Anesthesia: the lesion was anesthetized in a standard fashion   Anesthetic:  1% lidocaine w/ epinephrine 1-100,000 local infiltration Instrument used: flexible razor blade   Hemostasis achieved with: aluminum chloride and electrodesiccation   Outcome: patient tolerated procedure well   Post-procedure details: sterile dressing applied and wound care instructions given   Dressing type: bandage and petrolatum    Specimen 2 - Surgical pathology Differential Diagnosis: R/O BCC VS SCC - cautery  Check Margins: YES  Plantar wart (2) Left 5th Metatarsal Plantar Area; Left Medial Plantar Surface  DCA and home instructions.  BCC (basal cell carcinoma), leg, left Left Outer Thigh  Epidermal / dermal shaving  Lesion diameter (cm):  1.3 Informed consent:  discussed and consent obtained   Timeout: patient name, date of birth, surgical site, and procedure verified   Procedure prep:  Patient was prepped and draped in usual sterile fashion Prep type:  Chlorhexidine Anesthesia: the lesion was anesthetized in a standard fashion   Anesthetic:  1% lidocaine w/ epinephrine 1-100,000 local  infiltration Instrument used: DermaBlade   Hemostasis achieved with: aluminum chloride   Outcome: patient tolerated procedure well   Post-procedure details: sterile dressing applied and wound care instructions given   Dressing type: petrolatum gauze, petrolatum and bandage    Specimen 3 - Surgical pathology Differential Diagnosis: R/O BCC VS SCC - cautery  Check Margins: YES  BCC (basal cell carcinoma), arm, left Left Deltoid  Epidermal / dermal shaving  Lesion diameter (cm):  1.2 Informed consent: discussed and consent obtained   Timeout: patient name, date of birth, surgical site, and procedure verified   Procedure prep:  Patient was prepped and draped in usual sterile fashion Prep type:  Chlorhexidine Anesthesia: the lesion was anesthetized in a standard fashion   Anesthetic:  1% lidocaine w/ epinephrine 1-100,000 local infiltration Instrument used: DermaBlade   Hemostasis achieved with: aluminum chloride   Outcome: patient tolerated procedure well   Post-procedure details: sterile dressing applied and wound care instructions given   Dressing type: petrolatum gauze, petrolatum and bandage    Specimen 4 - Surgical pathology Differential Diagnosis: R/O BCC VS SCC - cautery [id number]  Check Margins: YES    I, Lajeana Strough, PA-C, have reviewed all documentation's for this visit.  The documentation on 07/16/21 for the exam, diagnosis, procedures and orders are all accurate and complete.

## 2021-07-16 NOTE — Addendum Note (Signed)
Addended by: Warren Danes on: 07/16/2021 02:43 PM   Modules accepted: Orders

## 2021-07-17 ENCOUNTER — Telehealth: Payer: Self-pay | Admitting: *Deleted

## 2021-07-17 NOTE — Telephone Encounter (Signed)
-----   Message from Warren Danes, [place] sent at 07/16/2021  2:43 PM EST ----- 30

## 2021-07-17 NOTE — Telephone Encounter (Signed)
Pathology to patient-surgery appointment scheduled.  

## 2021-07-23 ENCOUNTER — Other Ambulatory Visit (HOSPITAL_COMMUNITY): Payer: Self-pay | Admitting: Nurse Practitioner

## 2021-07-25 ENCOUNTER — Other Ambulatory Visit (HOSPITAL_COMMUNITY): Payer: Self-pay | Admitting: Nurse Practitioner

## 2021-08-06 ENCOUNTER — Other Ambulatory Visit (HOSPITAL_COMMUNITY): Payer: Self-pay | Admitting: *Deleted

## 2021-08-06 MED ORDER — DILTIAZEM HCL ER COATED BEADS 120 MG PO CP24: 120.0000 mg | ORAL_CAPSULE | Freq: Every day | ORAL | 1 refills | Status: DC | PRN

## 2021-08-08 ENCOUNTER — Other Ambulatory Visit: Payer: Self-pay

## 2021-08-08 ENCOUNTER — Encounter (HOSPITAL_COMMUNITY): Payer: Self-pay | Admitting: Nurse Practitioner

## 2021-08-08 ENCOUNTER — Ambulatory Visit (HOSPITAL_COMMUNITY)
Admission: RE | Admit: 2021-08-08 | Discharge: 2021-08-08 | Disposition: A | Discharge status: Home / Self Care | Payer: Medicare Other | Source: Ambulatory Visit | Attending: Nurse Practitioner | Admitting: Nurse Practitioner

## 2021-08-08 ENCOUNTER — Other Ambulatory Visit: Payer: Self-pay | Admitting: Cardiology

## 2021-08-08 VITALS — BP 130/60 | HR 63 | Ht 62.0 in | Wt 196.6 lb

## 2021-08-08 DIAGNOSIS — R42 Dizziness and giddiness: Secondary | ICD-10-CM | POA: Diagnosis not present

## 2021-08-08 DIAGNOSIS — Z7901 Long term (current) use of anticoagulants: Secondary | ICD-10-CM | POA: Diagnosis not present

## 2021-08-08 DIAGNOSIS — Z8673 Personal history of transient ischemic attack (TIA), and cerebral infarction without residual deficits: Secondary | ICD-10-CM | POA: Diagnosis not present

## 2021-08-08 DIAGNOSIS — I2510 Atherosclerotic heart disease of native coronary artery without angina pectoris: Secondary | ICD-10-CM | POA: Insufficient documentation

## 2021-08-08 DIAGNOSIS — I498 Other specified cardiac arrhythmias: Secondary | ICD-10-CM | POA: Diagnosis not present

## 2021-08-08 DIAGNOSIS — I480 Paroxysmal atrial fibrillation: Secondary | ICD-10-CM | POA: Insufficient documentation

## 2021-08-08 DIAGNOSIS — Z79899 Other long term (current) drug therapy: Secondary | ICD-10-CM | POA: Diagnosis not present

## 2021-08-08 DIAGNOSIS — I10 Essential (primary) hypertension: Secondary | ICD-10-CM | POA: Diagnosis not present

## 2021-08-08 MED ORDER — DILTIAZEM HCL ER COATED BEADS 120 MG PO CP24: 120.0000 mg | ORAL_CAPSULE | Freq: Every morning | ORAL | Status: DC

## 2021-08-08 NOTE — Progress Notes (Signed)
Primary Care Physician: Elaine Honour, MD Referring Physician: Dr. Royetta [place] is a 65 y.o. female with a h/o afib,CAD, HTN, previous CVA, s/p ablation, 09/2017.  She had  done well until she developed afib in the setting of steriod shot and taper for an injured foot. Unfortunately, this caused her BP to elevate and then onset of afib. She saw Dr. Rayann [place]  and placed on daily Cardizem.     F/u in afib clinic 05/23/20 She feels well. Just one episode of afib that resolved within 30 mins taking cardizem.  No issues with eliquis with a CHA2DS2VASc score of 6. Recent labs with PCP show normal BMET and CBC.   F/u in afib clinic, 05/02/21. Reports that afib remains quiet. She is in SR today.  Describes what sounds like intermittent PAC's.  Continues on eliquis without bleeding issues.   F/u in afib clinic for episodes that has happened over the last few weeks but more so over the last couple days.  She describes this as a feeling of lightheadedness/dizziness to the point she feels that she may fall. It usually lasts a few seconds. She had 6/7 episodes like this in one day. It makes her feel terrible. She is not aware of irregular heart beat at he time. She had a LInq at one time, expired in 2021. She wore a monitor last fall and showed pac's but no afib.  EKG shows SR today. We discussed  a  2 week Zio patch live and she is in agreement. No change in recent health.   Today, she denies symptoms of palpitations, chest pain, shortness of breath, orthopnea, PND, lower extremity edema, dizziness, presyncope, syncope, or neurologic sequela. The patient is tolerating medications without difficulties and is otherwise without complaint today.   Past Medical History:  Diagnosis Date   Allergic rhinitis, cause unspecified    Allergy    Basal cell carcinoma 10/04/1996   right chest-TX-DR.Turner   BCC (basal cell carcinoma) 12/05/2000   upper mid back TX DR.Jones   BCC (basal cell carcinoma)  12/05/2000   Left upper back TX Dr. Ronnald [place]   [facility] (basal cell carcinoma) 09/16/2001   left upper back TX Dr.Jones   BCC (basal cell carcinoma) 08/15/2003   left upper mid back T Dr.Jones   BCC (basal cell carcinoma) 08/15/2003   right upper mid back Dr. Ronnald [place]   [facility] (basal cell carcinoma) 08/15/2003   left medial breast TX Dr.jones   BCC (basal cell carcinoma) 10/12/2004   left anterior shin tx Dr.jones   BCC (basal cell carcinoma) 09/14/2007   right of sternum-Tx-CX66fu   BCC (basal cell carcinoma) 10/20/2012   left post sholder-tx CX35FU   BCC (basal cell carcinoma) 10/20/2012   left sholder tip-TX- CX35FU   BCC (basal cell carcinoma) 10/20/2012   left temple-TX CX35FU   BCC (basal cell carcinoma) 10/20/2012   left outer calf-TX cx29fu   BCC (basal cell carcinoma) 10/20/2012   left upper arm-tx cx56fu   BCC (basal cell carcinoma) 10/20/2012   right sholder tip-tx cx59fu   BCC (basal cell carcinoma) 11/10/2013   right upper back-cx20fu   BCC (basal cell carcinoma) 06/07/2014   left inferior post sholder-TXpbx   BCC (basal cell carcinoma) 06/13/2015   righ post sholder-TXpbx   BCC (basal cell carcinoma) 06/13/2015   left upper ankle   BCC (basal cell carcinoma) 03/18/2017   right outer back-txpbx   BCC (basal cell carcinoma) 03/18/2017   left trapezius-txpbx  BCC (basal cell carcinoma) 03/18/2017   left deltoid-txpbx   BCC (basal cell carcinoma) 06/05/2017   right rhomboid-txpbx   BCC (basal cell carcinoma) 04/28/2018   right tragus-txpbx   BCC (basal cell carcinoma) 06/09/2018   mid upper back txpbx   BCC (basal cell carcinoma) 03/09/2019   left deltoid inferior txpbx   BCC (basal cell carcinoma) 03/09/2019   left chest medial txpbx   CAD (coronary artery disease)    LHC 12/20/2014 showed small to moderate apical infarction due to distal LAD stenosis with reestablished TIMI2 flow. No PCI. Recommended plavix for 1 yr and ASA for life.      Chicken pox    Chronic  bronchitis (HCC)    Family history of breast cancer in first degree relative    sister age 54   Heart murmur    Hyperlipidemia    Hypertension    Hypothyroidism    Malignant neoplasm skin of face 02/16/2007   Basal Cell Carcinoma  [facility].   Measles    Melanoma ([place]) 12/17/2017   left forearm-MIS TX- Excision   Migraine <1982   "went away when I got pregnant" (09/25/2017)   Myocardial infarction ([place]) 01/2015   Obesity, unspecified    Other chronic nonalcoholic liver disease    Pre-diabetes    SCCA (squamous cell carcinoma) of skin 01/02/2021   Left Lower Leg - Anterior (in situ) tx cx3 73fu   SCCA (squamous cell carcinoma) of skin 01/02/2021   Left Submandibular Area (in situ) tx cx 3 26fu   Skin cancer 10/12/2004   right anterior shin-CIS-TX Dr.Jones   Skin cancer 03/10/2013   right lower shin-txpbx CIS   Skin cancer 01/18/2015   left neck-KA cx1 + Cautery   Skin cancer 02/28/2015   left neck-excision   Skin cancer 06/05/2017   right scapula-CIS   Skin cancer 04/28/2018   left ear helix-well diff scc-txpbx   Sleep related leg cramps    Stroke (cerebrum) ([place])    due to large vessel disease; denies residual on 09/25/2017   Thyroid nodule    10-2016    Unspecified vitamin D deficiency    Vision abnormalities    Past Surgical History:  Procedure Laterality Date   ATRIAL FIBRILLATION ABLATION  09/25/2017   ATRIAL FIBRILLATION ABLATION N/A 09/25/2017   Procedure: ATRIAL FIBRILLATION ABLATION;  Surgeon: Elaine Grayer, MD;  Location: [facility];  Service: Cardiovascular;  Laterality: N/A;   CARDIAC CATHETERIZATION N/A 12/20/2014   Procedure: Left Heart Cath and Coronary Angiography;  Surgeon: Elaine Klein, MD;  Location: [facility];  Service: Cardiovascular;  Laterality: N/A;   COLONOSCOPY  06/25/2007   [place]. Normal. Repeat in ten years.   CYSTOSTOMY W/ BLADDER DILATION  1960s   "bladder stem stretched"   LAPAROSCOPIC CHOLECYSTECTOMY     LAPAROSCOPIC GASTRIC  BANDING  07/03/10   Dr. Sherrin [facility]; [place]   LOOP RECORDER INSERTION N/A 08/23/2016   Procedure: Loop Recorder Insertion;  Surgeon: Elaine Sprang, MD;  Location: [facility];  Service: Cardiovascular;  Laterality: N/A;   SQUAMOUS CELL CARCINOMA EXCISION Left    neck   THYROIDECTOMY N/A 12/17/2016   Procedure: TOTAL THYROIDECTOMY;  Surgeon: Excell Seltzer, MD;  Location: [facility];  Service: General;  Laterality: N/A;   TONSILLECTOMY      Current Outpatient Medications  Medication Sig Dispense Refill   atorvastatin (LIPITOR) 80 MG tablet Take 80 mg by mouth daily.     azelastine (ASTELIN) 0.1 %  nasal spray Place 2 sprays into both nostrils as needed for rhinitis. Use in each nostril as directed     B COMPLEX-C-FOLIC ACID PO Take 1 tablet by mouth daily.     cholecalciferol (VITAMIN D) 400 units TABS tablet Take 2,000 Units by mouth daily.      CONTOUR NEXT TEST test strip AS DIRECTED 100 each 4   diltiazem (CARDIZEM CD) 120 MG 24 hr capsule Take 1 capsule (120 mg total) by mouth daily as needed (for breakthrough afib). 90 capsule 1   diltiazem (CARDIZEM) 30 MG tablet TAKE 1 TABLET EVERY 4 HOURS AS NEEDED FOR HR >100 AND TOP BP>100 45 tablet 1   ELIQUIS 5 MG TABS tablet TAKE 1 TABLET BY MOUTH TWICE A DAY 180 tablet 2   fluticasone (FLONASE) 50 MCG/ACT nasal spray Place 2 sprays into both nostrils daily as needed for allergies. 48 g 3   furosemide (LASIX) 20 MG tablet Take 20 mg by mouth as needed.     levothyroxine (SYNTHROID) 112 MCG tablet Take 112 mcg by mouth daily.     montelukast (SINGULAIR) 10 MG tablet Take 10 mg by mouth daily.     potassium chloride SA (K-DUR,KLOR-CON) 20 MEQ tablet Take 20 mEq by mouth as needed. Take with lasix.     valsartan (DIOVAN) 80 MG tablet TAKE 1 TABLET BY MOUTH EVERY DAY 90 tablet 0   No current facility-administered medications for this encounter.    Allergies  Allergen Reactions   Sulfa Antibiotics Nausea And Vomiting and Other (See  Comments)   Zofran [Ondansetron Hcl] Other (See Comments)    Oral numbness with IV dosing.     Social History   Socioeconomic History   Marital status: Married    Spouse name: Not on file   Number of children: 1   Years of education: Not on file   Highest education level: Not on file  Occupational History   Occupation: HAIR STYLIST    Employer: [employer]  Tobacco Use   Smoking status: Former    Packs/day: 1.00    Years: 25.00    Pack years: 25.00    Types: Cigarettes    Quit date: 03/26/1995    Years since quitting: 26.3   Smokeless tobacco: Never  Vaping Use   Vaping Use: Never used  Substance and Sexual Activity   Alcohol use: Not Currently   Drug use: No   Sexual activity: Not Currently    Birth control/protection: Post-menopausal  Other Topics Concern   Not on file  Social History Narrative   Marital status:  Married; x 27 years. Second marriage. 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      Tobacco:  None      Alcohol: rare/minimal.      Drugs:  None       Exercise:        Seatbelt:  100%.       Sunscreen: SPF 15.      Guns:  Maybe?   Social Determinants of Health   Financial Resource Strain: Not on file  Food Insecurity: Not on file  Transportation Needs: Not on file  Physical Activity: Not on file  Stress: Not on file  Social Connections: Not on file  Intimate Partner Violence: Not on file  Family History  Problem Relation Age of Onset   Diabetes Mother    Heart disease Mother        defibrillator; carotid artery stenosis.   Hypertension Mother    Hyperlipidemia Mother    Dementia Mother    Congestive Heart Failure Mother    Heart disease Father    Kidney failure Father        ESRD/peritoneal dialysis   Diabetes Father    Hypertension Father    Hyperlipidemia Father    Stroke  Father    Kidney disease Father    Congestive Heart Failure Father    Diabetes Sister    Hypertension Sister    Kidney disease Sister        renal failure   Diabetes Brother    Hypertension Brother    Hyperlipidemia Brother    Arthritis Brother    Cervical cancer Sister    Hypertension Sister    Liver cancer Sister 41   Cancer Sister        Liver cancer   Stroke Maternal Grandmother    Heart disease Maternal Grandfather    Stroke Paternal Grandmother    Heart disease Paternal Grandmother    Heart disease Paternal Grandfather    Stroke Paternal Grandfather    Breast cancer Sister 51   Hepatitis C Sister 63   Thyroid disease Neg Hx     ROS- All systems are reviewed and negative except as per the HPI above  Physical Exam: There were no vitals filed for this visit.  Wt Readings from Last 3 Encounters:  05/02/21 90.9 kg  11/01/20 90.7 kg  05/23/20 90.5 kg    Labs: Lab Results  Component Value Date   NA 142 11/01/2020   K 4.8 11/01/2020   CL 101 11/01/2020   CO2 26 11/01/2020   GLUCOSE 109 (H) 11/01/2020   BUN 18 11/01/2020   CREATININE 0.89 11/01/2020   CALCIUM 10.0 11/01/2020   Lab Results  Component Value Date   INR 0.97 08/20/2016   Lab Results  Component Value Date   CHOL 136 11/01/2020   HDL 50 11/01/2020   LDLCALC 70 11/01/2020   TRIG 83 11/01/2020     GEN- The patient is well appearing, alert and oriented x 3 today.   Head- normocephalic, atraumatic Eyes-  Sclera clear, conjunctiva pink Ears- hearing intact Oropharynx- clear Neck- supple, no JVP Lymph- no cervical lymphadenopathy Lungs- Clear to ausculation bilaterally, normal work of breathing Heart- Regular rate and rhythm, no murmurs, rubs or gallops, PMI not laterally displaced GI- soft, NT, ND, + BS Extremities- no clubbing, cyanosis, or edema MS- no significant deformity or atrophy Skin- no rash or lesion Psych- euthymic mood, full affect Neuro- strength and sensation are  intact  EKG-Sinus brady at 63 bpm, pr int 152  ms, qrs int 78  ms, qt 392 ms  Epic records reviewed   Assessment and Plan:  1. Paroxysmal afib  S/p ablation 09/2017 and staying in [place]    Having recent lightheaded/off balance spells that makes her concerned for falling  spells Last a few seconds at a time  Placed a one week zio patch last fall with PAC's or short burst of tachycardia, no afib  Will place a 2 week LIVE Zio patch to try to catch  these spells in a prompt manner  Continue Eliquis 5 mg bid for chadsvasc score of at least 6  2. HTN Stable   3. CAD No anginal c/o's Per Dr.  Skains   F/u in afib clinic as  monitor results result   Butch Penny C. Monick Rena, [facility] [address] [place], [place] [address] [phone]

## 2021-08-15 ENCOUNTER — Encounter: Payer: Medicare Other | Admitting: Physician Assistant

## 2021-08-27 ENCOUNTER — Encounter: Payer: Self-pay | Admitting: Physician Assistant

## 2021-08-27 ENCOUNTER — Other Ambulatory Visit: Payer: Self-pay

## 2021-08-27 ENCOUNTER — Ambulatory Visit (INDEPENDENT_AMBULATORY_CARE_PROVIDER_SITE_OTHER): Payer: Medicare Other | Admitting: Physician Assistant

## 2021-08-27 DIAGNOSIS — C44719 Basal cell carcinoma of skin of left lower limb, including hip: Secondary | ICD-10-CM | POA: Diagnosis not present

## 2021-08-27 DIAGNOSIS — C44619 Basal cell carcinoma of skin of left upper limb, including shoulder: Secondary | ICD-10-CM | POA: Diagnosis not present

## 2021-08-27 DIAGNOSIS — D0471 Carcinoma in situ of skin of right lower limb, including hip: Secondary | ICD-10-CM | POA: Diagnosis not present

## 2021-08-27 DIAGNOSIS — C44712 Basal cell carcinoma of skin of right lower limb, including hip: Secondary | ICD-10-CM

## 2021-08-27 NOTE — Patient Instructions (Signed)

## 2021-08-27 NOTE — Progress Notes (Signed)
? ?Follow-Up Visit ?  ?Subjective  ?[facility] is a 66 y.o. female who presents for the following: Follow-up (Scc x 1 and bcc  x 3 ). ? ? ?The following portions of the chart were reviewed this encounter and updated as appropriate:  Tobacco  Allergies  Meds  Problems  Med Hx  Surg Hx  Fam Hx   ?  ? ?Objective  ?Well appearing patient in no apparent distress; mood and affect are within normal limits. ? ?A full examination was performed including scalp, head, eyes, ears, nose, lips, neck, chest, axillae, abdomen, back, buttocks, bilateral upper extremities, bilateral lower extremities, hands, feet, fingers, toes, fingernails, and toenails. All findings within normal limits unless otherwise noted below. ? ?Left Upper Arm - Anterior ?Pink macule. Lesion identified by Azalee Course and nurse in room.   ? ?Right Thigh - Anterior ?Pink crust with central scab. Lesion identified by Azalee Course and nurse in room.   ? ?Right Lower Leg - Anterior ?Raised plaque on a pink base. Lesion identified by Azalee Course and nurse in room.   ? ?Left Outer Thigh ?Pink crusted papule. Lesion identified by Robyne Askew, PA and nurse in room.   ? ? ?Assessment & Plan  ?Basal cell carcinoma (BCC) of skin of left upper extremity including shoulder ?Left Upper Arm - Anterior ? ?Destruction of lesion ?Complexity: simple   ?Destruction method: electrodesiccation and curettage   ?Informed consent: discussed and consent obtained   ?Timeout:  patient name, date of birth, surgical site, and procedure verified ?Anesthesia: the lesion was anesthetized in a standard fashion   ?Anesthetic:  1% lidocaine w/ epinephrine 1-100,000 local infiltration ?Curettage performed in three different directions: Yes   ?Electrodesiccation performed over the curetted area: Yes   ?Curettage cycles:  3 ?Margin per side (cm):  0.1 ?Final wound size (cm):  2.3 ?Hemostasis achieved with:  ferric subsulfate ?Outcome: patient tolerated procedure  well with no complications   ?Post-procedure details: sterile dressing applied and wound care instructions given   ?Dressing type: bandage and petrolatum   ?Additional details:  Wound innoculated with 5 fluorouracil solution. ? ?Basal cell carcinoma (BCC) of skin of right lower extremity including hip ?Right Thigh - Anterior ? ?Destruction of lesion ?Complexity: simple   ?Destruction method: electrodesiccation and curettage   ?Informed consent: discussed and consent obtained   ?Timeout:  patient name, date of birth, surgical site, and procedure verified ?Anesthesia: the lesion was anesthetized in a standard fashion   ?Anesthetic:  1% lidocaine w/ epinephrine 1-100,000 local infiltration ?Curettage performed in three different directions: Yes   ?Electrodesiccation performed over the curetted area: Yes   ?Curettage cycles:  3 ?Margin per side (cm):  0.1 ?Final wound size (cm):  2 ?Hemostasis achieved with:  ferric subsulfate ?Outcome: patient tolerated procedure well with no complications   ?Post-procedure details: sterile dressing applied and wound care instructions given   ?Dressing type: bandage and petrolatum   ?Additional details:  Wound innoculated with 5 fluorouracil solution. ? ?Squamous cell carcinoma in situ (SCCIS) of skin of right lower leg ?Right Lower Leg - Anterior ? ?Destruction of lesion ?Complexity: simple   ?Destruction method: electrodesiccation and curettage   ?Informed consent: discussed and consent obtained   ?Timeout:  patient name, date of birth, surgical site, and procedure verified ?Anesthesia: the lesion was anesthetized in a standard fashion   ?Anesthetic:  1% lidocaine w/ epinephrine 1-100,000 local infiltration ?Curettage performed in three different directions: Yes   ?Electrodesiccation performed over the  curetted area: Yes   ?Curettage cycles:  3 ?Margin per side (cm):  0.1 ?Final wound size (cm):  1.5 ?Hemostasis achieved with:  ferric subsulfate ?Outcome: patient tolerated procedure  well with no complications   ?Post-procedure details: sterile dressing applied and wound care instructions given   ?Dressing type: bandage and petrolatum   ?Additional details:  Wound innoculated with 5 fluorouracil solution. ? ?Basal cell carcinoma (BCC) of skin of left lower extremity including hip ?Left Outer Thigh ? ?Destruction of lesion ?Complexity: simple   ?Destruction method: electrodesiccation and curettage   ?Informed consent: discussed and consent obtained   ?Timeout:  patient name, date of birth, surgical site, and procedure verified ?Anesthesia: the lesion was anesthetized in a standard fashion   ?Anesthetic:  1% lidocaine w/ epinephrine 1-100,000 local infiltration ?Curettage performed in three different directions: Yes   ?Electrodesiccation performed over the curetted area: Yes   ?Curettage cycles:  3 ?Margin per side (cm):  0.1 ?Final wound size (cm):  1.3 ?Hemostasis achieved with:  ferric subsulfate and electrodesiccation ?Outcome: patient tolerated procedure well with no complications   ?Post-procedure details: sterile dressing applied and wound care instructions given   ?Dressing type: bandage and petrolatum   ?Additional details:  Wound innoculated with 5 fluorouracil solution. ? ? ? ?I, Declan Adamson, PA-C, have reviewed all documentation's for this visit.  The documentation on 08/27/21 for the exam, diagnosis, procedures and orders are all accurate and complete. ?

## 2021-08-28 NOTE — Addendum Note (Signed)
Encounter addended by: Juluis Mire, RN on: 08/28/2021 2:00 PM  Actions taken: Imaging Exam ended

## 2021-08-30 ENCOUNTER — Telehealth: Payer: Self-pay | Admitting: Cardiology

## 2021-08-30 NOTE — Telephone Encounter (Signed)
Results discussed. Pt verbalized understanding. ?

## 2021-08-30 NOTE — Telephone Encounter (Signed)
Patient would like someone to call her to go over her monitor results  ?

## 2021-09-03 ENCOUNTER — Telehealth: Payer: Self-pay | Admitting: *Deleted

## 2021-09-03 NOTE — Telephone Encounter (Signed)
Received pt assistance forms for pt for Eliquis. (Dropped at front desk)  Paperwork completed, Dr Marlou Porch has signed and information faxed to [facility].  This pw includes application, copy of insurance cards, OOP cost for year to date at pharmacy and [id number] 2021 tax return for seniors. ?

## 2021-09-13 NOTE — Telephone Encounter (Signed)
**Note De-Identified [facility] Obfuscation** Letter received from [facility] stating that the pt did not write how many people live in her household on her application for Eliquis asst. ? ?I called the pt and she stated that she received the same letter and called [employer] and provided that information to them late last week and that she received her Eliquis shipment in the mail yesterday. ?She states that all has been taken care of and is aware to call us back if she requires asst from [place] going forward. ? ?She thanked me for calling her. ?

## 2021-09-17 ENCOUNTER — Other Ambulatory Visit: Payer: Self-pay

## 2021-09-17 ENCOUNTER — Ambulatory Visit
Admission: RE | Admit: 2021-09-17 | Discharge: 2021-09-17 | Disposition: A | Discharge status: Home / Self Care | Payer: Medicare Other | Source: Ambulatory Visit | Attending: Family Medicine | Admitting: Family Medicine

## 2021-09-17 DIAGNOSIS — N6489 Other specified disorders of breast: Secondary | ICD-10-CM

## 2021-09-19 ENCOUNTER — Ambulatory Visit (INDEPENDENT_AMBULATORY_CARE_PROVIDER_SITE_OTHER): Payer: Medicare Other | Admitting: Physician Assistant

## 2021-09-19 ENCOUNTER — Encounter: Payer: Self-pay | Admitting: Physician Assistant

## 2021-09-19 DIAGNOSIS — C44619 Basal cell carcinoma of skin of left upper limb, including shoulder: Secondary | ICD-10-CM | POA: Diagnosis not present

## 2021-09-19 NOTE — Patient Instructions (Signed)

## 2021-09-24 ENCOUNTER — Telehealth: Payer: Self-pay

## 2021-09-24 NOTE — Telephone Encounter (Signed)
-----   Message from Warren Danes, [place] sent at 09/24/2021  3:28 PM EDT ----- ?Free margins. ?

## 2021-09-24 NOTE — Telephone Encounter (Signed)
Path to patient she has a 3 months  ?

## 2021-10-02 ENCOUNTER — Other Ambulatory Visit: Payer: Self-pay | Admitting: Family Medicine

## 2021-10-02 DIAGNOSIS — Z09 Encounter for follow-up examination after completed treatment for conditions other than malignant neoplasm: Secondary | ICD-10-CM

## 2021-10-02 DIAGNOSIS — N6489 Other specified disorders of breast: Secondary | ICD-10-CM

## 2021-10-03 ENCOUNTER — Ambulatory Visit (INDEPENDENT_AMBULATORY_CARE_PROVIDER_SITE_OTHER): Payer: Medicare Other

## 2021-10-03 DIAGNOSIS — Z4802 Encounter for removal of sutures: Secondary | ICD-10-CM

## 2021-10-03 NOTE — Progress Notes (Signed)
NTS Suture removal, No s/s of infection, path to pt. 

## 2021-10-05 ENCOUNTER — Encounter (HOSPITAL_BASED_OUTPATIENT_CLINIC_OR_DEPARTMENT_OTHER): Payer: Self-pay | Admitting: Internal Medicine

## 2021-10-05 ENCOUNTER — Ambulatory Visit (INDEPENDENT_AMBULATORY_CARE_PROVIDER_SITE_OTHER): Payer: Medicare Other | Admitting: Internal Medicine

## 2021-10-05 VITALS — BP 90/60 | HR 131 | Ht 62.5 in | Wt 197.2 lb

## 2021-10-05 DIAGNOSIS — D6869 Other thrombophilia: Secondary | ICD-10-CM | POA: Diagnosis not present

## 2021-10-05 DIAGNOSIS — I480 Paroxysmal atrial fibrillation: Secondary | ICD-10-CM

## 2021-10-05 DIAGNOSIS — R002 Palpitations: Secondary | ICD-10-CM

## 2021-10-05 DIAGNOSIS — R42 Dizziness and giddiness: Secondary | ICD-10-CM | POA: Diagnosis not present

## 2021-10-05 MED ORDER — DILTIAZEM HCL ER COATED BEADS 240 MG PO CP24: 240.0000 mg | ORAL_CAPSULE | Freq: Every day | ORAL | 3 refills | Status: DC

## 2021-10-05 NOTE — Progress Notes (Addendum)
PCP: Ethelda Chick, MD   Primary EP: Dr Mathis Dad is a 66 y.o. female who presents today for routine electrophysiology followup.  Since last being seen in our clinic, the patient reports doing reasonably well.  She has had palpitations recently with associated dizziness.  These are very brief and consistent with her ventricular ectopy seen on recent Zio monitor.   She is in afib today and aware.  She feels that her AF is mostly controlled.  She is clear that her afib started today after taking prednisone which she is taking for URI. Today, she denies symptoms of chest pain, shortness of breath,  lower extremity edema, presyncope, or syncope.  The patient is otherwise without complaint today.   Past Medical History:  Diagnosis Date   Allergic rhinitis, cause unspecified    Allergy    Basal cell carcinoma 10/04/1996   right chest-TX-DR.Turner   BCC (basal cell carcinoma) 12/05/2000   upper mid back TX DR.Jones   BCC (basal cell carcinoma) 12/05/2000   Left upper back TX Dr. Yetta Barre   [facility] (basal cell carcinoma) 09/16/2001   left upper back TX Dr.Jones   BCC (basal cell carcinoma) 08/15/2003   left upper mid back T Dr.Jones   BCC (basal cell carcinoma) 08/15/2003   right upper mid back Dr. Yetta Barre   [facility] (basal cell carcinoma) 08/15/2003   left medial breast TX Dr.jones   BCC (basal cell carcinoma) 10/12/2004   left anterior shin tx Dr.jones   BCC (basal cell carcinoma) 09/14/2007   right of sternum-Tx-CX75fu   BCC (basal cell carcinoma) 10/20/2012   left post sholder-tx CX35FU   BCC (basal cell carcinoma) 10/20/2012   left sholder tip-TX- CX35FU   BCC (basal cell carcinoma) 10/20/2012   left temple-TX CX35FU   BCC (basal cell carcinoma) 10/20/2012   left outer calf-TX cx33fu   BCC (basal cell carcinoma) 10/20/2012   left upper arm-tx cx52fu   BCC (basal cell carcinoma) 10/20/2012   right sholder tip-tx cx10fu   BCC (basal cell carcinoma) 11/10/2013   right upper  back-cx85fu   BCC (basal cell carcinoma) 06/07/2014   left inferior post sholder-TXpbx   BCC (basal cell carcinoma) 06/13/2015   righ post sholder-TXpbx   BCC (basal cell carcinoma) 06/13/2015   left upper ankle   BCC (basal cell carcinoma) 03/18/2017   right outer back-txpbx   BCC (basal cell carcinoma) 03/18/2017   left trapezius-txpbx   BCC (basal cell carcinoma) 03/18/2017   left deltoid-txpbx   BCC (basal cell carcinoma) 06/05/2017   right rhomboid-txpbx   BCC (basal cell carcinoma) 04/28/2018   right tragus-txpbx   BCC (basal cell carcinoma) 06/09/2018   mid upper back txpbx   BCC (basal cell carcinoma) 03/09/2019   left deltoid inferior txpbx (exc 09/19/21)   BCC (basal cell carcinoma) 03/09/2019   left chest medial txpbx   CAD (coronary artery disease)    LHC 12/20/2014 showed small to moderate apical infarction due to distal LAD stenosis with reestablished TIMI2 flow. No PCI. Recommended plavix for 1 yr and ASA for life.      Chicken pox    Chronic bronchitis (HCC)    Family history of breast cancer in first degree relative    sister age 67   Heart murmur    Hyperlipidemia    Hypertension    Hypothyroidism    Malignant neoplasm skin of face 02/16/2007   Basal Cell Carcinoma  Tafeen.   Measles  Melanoma (HCC) 12/17/2017   left forearm-MIS TX- Excision   Migraine <1982   "went away when I got pregnant" (09/25/2017)   Myocardial infarction (HCC) 01/2015   Obesity, unspecified    Other chronic nonalcoholic liver disease    Pre-diabetes    SCCA (squamous cell carcinoma) of skin 01/02/2021   Left Lower Leg - Anterior (in situ) tx cx3 15fu   SCCA (squamous cell carcinoma) of skin 01/02/2021   Left Submandibular Area (in situ) tx cx 3 39fu   Skin cancer 10/12/2004   right anterior shin-CIS-TX Dr.Jones   Skin cancer 03/10/2013   right lower shin-txpbx CIS   Skin cancer 01/18/2015   left neck-KA cx1 + Cautery   Skin cancer 02/28/2015   left neck-excision   Skin  cancer 06/05/2017   right scapula-CIS   Skin cancer 04/28/2018   left ear helix-well diff scc-txpbx   Sleep related leg cramps    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; denies residual on 09/25/2017   Thyroid nodule    10-2016    Unspecified vitamin D deficiency    Vision abnormalities    Past Surgical History:  Procedure Laterality Date   ATRIAL FIBRILLATION ABLATION  09/25/2017   ATRIAL FIBRILLATION ABLATION N/A 09/25/2017   Procedure: ATRIAL FIBRILLATION ABLATION;  Surgeon: Hillis Range, MD;  [facility];  Service: Cardiovascular;  Laterality: N/A;   CARDIAC CATHETERIZATION N/A 12/20/2014   Procedure: Left Heart Cath and Coronary Angiography;  Surgeon: Thurmon Fair, MD;  [facility]: [facility];  Service: Cardiovascular;  Laterality: N/A;   COLONOSCOPY  06/25/2007   Hung. Normal. Repeat in ten years.   CYSTOSTOMY W/ BLADDER DILATION  1960s   "bladder stem stretched"   LAPAROSCOPIC CHOLECYSTECTOMY     LAPAROSCOPIC GASTRIC BANDING  07/03/10   Dr. Adriana Mccallum; [place]   LOOP RECORDER INSERTION N/A 08/23/2016   Procedure: Loop Recorder Insertion;  Surgeon: Duke Salvia, MD;  Location: [place] [facility] CV LAB;  Service: Cardiovascular;  Laterality: N/A;   SQUAMOUS CELL CARCINOMA EXCISION Left    neck   THYROIDECTOMY N/A 12/17/2016   Procedure: TOTAL THYROIDECTOMY;  Surgeon: Glenna Fellows, MD;  Location: [facility];  Service: General;  Laterality: N/A;   TONSILLECTOMY      ROS- all systems are reviewed and negatives except as per HPI above  Current Outpatient Medications  Medication Sig Dispense Refill   amoxicillin-clavulanate (AUGMENTIN) 875-125 MG tablet Take 1 tablet by mouth 2 (two) times daily.     atorvastatin (LIPITOR) 80 MG tablet Take 80 mg by mouth daily.     azelastine (ASTELIN) 0.1 % nasal spray Place 2 sprays into both nostrils as needed for rhinitis. Use in each nostril as directed     B COMPLEX-C-FOLIC ACID PO Take 1 tablet by  mouth daily.     cholecalciferol (VITAMIN D) 400 units TABS tablet Take 2,000 Units by mouth daily.      CONTOUR NEXT TEST test strip AS DIRECTED 100 each 4   diltiazem (CARDIZEM CD) 120 MG 24 hr capsule Take 1 capsule (120 mg total) by mouth in the morning.     diltiazem (CARDIZEM) 30 MG tablet TAKE 1 TABLET EVERY 4 HOURS AS NEEDED FOR HR >100 AND TOP BP>100 45 tablet 1   ELIQUIS 5 MG TABS tablet TAKE 1 TABLET BY MOUTH TWICE A DAY 180 tablet 2   fluconazole (DIFLUCAN) 150 MG tablet Take 150 mg by mouth once.  fluticasone (FLONASE) 50 MCG/ACT nasal spray Place 2 sprays into both nostrils daily as needed for allergies. 48 g 3   furosemide (LASIX) 20 MG tablet Take 20 mg by mouth as needed.     levothyroxine (SYNTHROID) 112 MCG tablet Take 112 mcg by mouth daily.     methylPREDNISolone (MEDROL DOSEPAK) 4 MG TBPK tablet Take by mouth.     montelukast (SINGULAIR) 10 MG tablet Take 10 mg by mouth daily.     potassium chloride SA (K-DUR,KLOR-CON) 20 MEQ tablet Take 20 mEq by mouth as needed. Take with lasix.     valsartan (DIOVAN) 80 MG tablet TAKE 1 TABLET BY MOUTH EVERY DAY 90 tablet 0   No current facility-administered medications for this visit.    Physical Exam: Vitals:   10/05/21 1357  BP: 90/60  Pulse: (!) 131  SpO2: 97%  Weight: 197 lb 3.2 oz (89.4 kg)  Height: 5' 2.5" (1.588 m)    GEN- The patient is well appearing, alert and oriented x 3 today.   Head- normocephalic, atraumatic Eyes-  Sclera clear, conjunctiva pink Ears- hearing intact Oropharynx- clear Lungs- Clear to ausculation bilaterally, normal work of breathing Heart- tachycardic irregular rhythm GI- soft, NT, ND, + BS Extremities- no clubbing, cyanosis, or edema  Wt Readings from Last 3 Encounters:  10/05/21 197 lb 3.2 oz (89.4 kg)  08/08/21 196 lb 9.6 oz (89.2 kg)  05/02/21 200 lb 6.4 oz (90.9 kg)    EKG tracing ordered today is personally reviewed and shows afib, V rates 130s  Assessment and  Plan:  Paroxysmal atriai fibrillation Well controlled Currently in AF, likely due to steroids Hopefully this will improve Increase diltiazem CD to 240mg  daily today Chads2vasc score is 6.  Continue eliquis  2 CAD No ischemic symptoms No changes  3. HTN Stable No change required today  4. Obesity Body mass index is 35.49 kg/m. Lifestyle modification including regular exercise and weight reduction are advised  5. PVCs Increase diltiazem Lifestyle modification Echo is ordered  Risks, benefits and potential toxicities for medications prescribed and/or refilled reviewed with patient today.   Follow-up in AF clinic in 4 weeks. May benefit from initiation of AAD therapy if arrhythmias persist off of prednisone  Hillis Range MD, [facility] 10/05/2021 2:05 PM

## 2021-10-05 NOTE — Patient Instructions (Addendum)
Medication Instructions:  ?Your physician has recommended you make the following change in your medication:  ? ? INCREASE your diltiazem CD--Take 240 mg by mouth daily ? ?Labwork: ?None ordered. ? ?Testing/Procedures: ?Your physician has requested that you have an echocardiogram. Echocardiography is a painless test that uses sound waves to create images of your heart. It provides your doctor with information about the size and shape of your heart and how well your heart?s chambers and valves are working. This procedure takes approximately one hour. There are no restrictions for this procedure. ? ?Please schedule for ECHO ? ?Follow-Up: ?Your physician wants you to follow-up in: 4 weeks with the AFib clinic. ? ?Any Other Special Instructions Will Be Listed Below (If Applicable). ? ?If you need a refill on your cardiac medications before your next appointment, please call your pharmacy.  ? ?Important Information About Sugar ? ? ? ? ? ? ? ?

## 2021-10-08 ENCOUNTER — Encounter: Payer: Self-pay | Admitting: Physician Assistant

## 2021-10-08 NOTE — Progress Notes (Signed)
? ?  Follow-Up Visit ?  ?Subjective  ?Elaine Jackson is a 66 y.o. female who presents for the following: Procedure (Here for excision- left deltoid inferior). ? ? ?The following portions of the chart were reviewed this encounter and updated as appropriate:  Tobacco  Allergies  Meds  Problems  Med Hx  Surg Hx  Fam Hx   ?  ? ?Objective  ?Well appearing patient in no apparent distress; mood and affect are within normal limits. ? ?A focused examination was performed including left deltoid. Relevant physical exam findings are noted in the Assessment and Plan. ? ?Left Deltoid inferior ?Residual pink scale ? ? ?Assessment & Plan  ?Basal cell carcinoma of skin of left upper limb, including shoulder ?Left Deltoid inferior ? ?Skin excision ? ?Total excision diameter (cm):  3.4 ?Informed consent: discussed and consent obtained   ?Timeout: patient name, date of birth, surgical site, and procedure verified   ?Anesthesia: the lesion was anesthetized in a standard fashion   ?Anesthetic:  1% lidocaine w/ epinephrine 1-100,000 local infiltration ?Instrument used: #15 blade   ?Hemostasis achieved with: pressure and electrodesiccation   ?Outcome: patient tolerated procedure well with no complications   ?Post-procedure details: sterile dressing applied and wound care instructions given   ?Dressing type: bandage, petrolatum and pressure dressing   ? ?Skin repair ?Complexity:  Simple ?Final length (cm):  3.4 ?Informed consent: discussed and consent obtained   ?Timeout: patient name, date of birth, surgical site, and procedure verified   ?Procedure prep:  Patient was prepped and draped in usual sterile fashion (non sterile) ?Prep type:  Chlorhexidine ?Anesthesia: the lesion was anesthetized in a standard fashion   ?Undermining: edges undermined   ?Fine/surface layer approximation (top stitches):  ?Suture size:  4-0 ?Suture type: Vicryl Rapide (coated polyglactin 910)   ?Suture type comment:  Nylon ?Stitches: simple running   ?Suture  removal (days):  14 ?Hemostasis achieved with: suture, pressure and electrodesiccation ?Outcome: patient tolerated procedure well with no complications   ?Post-procedure details: wound care instructions given   ?Post-procedure details comment:  Non sterile pressure  ?Additional details:  3-0 vicryl x 3 ?4-0 Ethilon x 1 ? ?Specimen 1 - Surgical pathology ?Differential Diagnosis: bcc (exc) ?[id number] ?Intferior margin stained ? ?Check Margins: yes ? ? ? ?I, Taryne Kiger, PA-C, have reviewed all documentation's for this visit.  The documentation on 10/08/21 for the exam, diagnosis, procedures and orders are all accurate and complete. ?

## 2021-10-12 ENCOUNTER — Ambulatory Visit (INDEPENDENT_AMBULATORY_CARE_PROVIDER_SITE_OTHER): Payer: Medicare Other

## 2021-10-12 DIAGNOSIS — I480 Paroxysmal atrial fibrillation: Secondary | ICD-10-CM | POA: Diagnosis not present

## 2021-10-12 LAB — ECHOCARDIOGRAM COMPLETE
AR max vel: 2.3 cm2
AV Area VTI: 2.31 cm2
AV Area mean vel: 2.35 cm2
AV Mean grad: 4 mmHg
AV Peak grad: 8.8 mmHg
AV Vena cont: 0.38 cm
Ao pk vel: 1.48 m/s
Area-P 1/2: 3.74 cm2
Calc EF: 69.8 %
P 1/2 time: 643 msec
S' Lateral: 2.83 cm
Single Plane A2C EF: 70.8 %
Single Plane A4C EF: 70.8 %

## 2021-11-01 ENCOUNTER — Ambulatory Visit: Payer: Medicare Other | Admitting: Physician Assistant

## 2021-11-06 ENCOUNTER — Ambulatory Visit (HOSPITAL_COMMUNITY)
Admission: RE | Admit: 2021-11-06 | Discharge: 2021-11-06 | Disposition: A | Discharge status: Home / Self Care | Payer: Medicare Other | Source: Ambulatory Visit | Attending: Nurse Practitioner | Admitting: Nurse Practitioner

## 2021-11-06 ENCOUNTER — Other Ambulatory Visit: Payer: Self-pay | Admitting: Cardiology

## 2021-11-06 ENCOUNTER — Encounter (HOSPITAL_COMMUNITY): Payer: Self-pay | Admitting: Nurse Practitioner

## 2021-11-06 VITALS — BP 156/74 | HR 51 | Ht 62.5 in | Wt 198.6 lb

## 2021-11-06 DIAGNOSIS — D6869 Other thrombophilia: Secondary | ICD-10-CM

## 2021-11-06 DIAGNOSIS — I10 Essential (primary) hypertension: Secondary | ICD-10-CM | POA: Insufficient documentation

## 2021-11-06 DIAGNOSIS — I2510 Atherosclerotic heart disease of native coronary artery without angina pectoris: Secondary | ICD-10-CM | POA: Diagnosis not present

## 2021-11-06 DIAGNOSIS — Z7901 Long term (current) use of anticoagulants: Secondary | ICD-10-CM | POA: Insufficient documentation

## 2021-11-06 DIAGNOSIS — I480 Paroxysmal atrial fibrillation: Secondary | ICD-10-CM | POA: Insufficient documentation

## 2021-11-06 DIAGNOSIS — Z8673 Personal history of transient ischemic attack (TIA), and cerebral infarction without residual deficits: Secondary | ICD-10-CM | POA: Diagnosis not present

## 2021-11-06 NOTE — Progress Notes (Addendum)
? ?Primary Care Physician: [name], MD ?Referring Physician: Dr. Rayann Heman ? ? ?Elaine Jackson is a 66 y.o. female with a h/o afib,CAD, HTN, previous CVA, s/p ablation, 09/2017.  She had  done well until she developed afib in the setting of steriod shot and taper for an injured foot. Unfortunately, this caused her BP to elevate and then onset of afib. She saw Dr. Rayann Heman  and placed on daily Cardizem.    ? ?F/u in afib clinic 05/23/20 She feels well. Just one episode of afib that resolved within 30 mins taking cardizem.  No issues with eliquis with a CHA2DS2VASc score of 6. Recent labs with PCP show normal BMET and CBC.  ? ?F/u in afib clinic, 05/02/21. Reports that afib remains quiet. She is in SR today.  ?Describes what sounds like intermittent PAC's.  Continues on eliquis without bleeding issues.  ? ?F/u in afib clinic for episodes that has happened over the last few weeks but more so over the last couple days.  She describes this as a feeling of lightheadedness/dizziness to the point she feels that she may fall. It usually lasts a few seconds. She had 6/7 episodes like this in one day. It makes her feel terrible. She is not aware of irregular heart beat at he time. She had a LInq at one time, expired in 2021. She wore a monitor last fall and showed pac's but no afib.  EKG shows SR today. We discussed  a  2 week Zio patch live and she is in agreement. No change in recent health.  ? ?F/u  in afib clinic, 11/06/21. She was in afib 2/2 prednisone use when she saw Dr. Rayann Heman last month. He increased Cardizem and ask for her to f/u here. Today, ekg shows SR. Recent monitor showed atrial and ventricular ectopy over all  still low burden. No afib.  This was reviewed by Dr. Rayann Heman as well. ECHO done and unchanged. She is describing a sensation over lower esophagus  when walking to get to her mailbox.She is not short of breath nor does she describe anterior chest tightness/discomfort. Just a sensation she has  noted over  the last couple of weeks. Sounds atypical for heart pain. She has had a lap band in the past. Use to walk 2 miles a day but has not exercised in some time. The earlier ectopy that pt was experiencing has resolved with increase of CCB.  ? ?Today, she denies symptoms of palpitations, chest pain, shortness of breath, orthopnea, PND, lower extremity edema, dizziness, presyncope, syncope, or neurologic sequela. The patient is tolerating medications without difficulties and is otherwise without complaint today.  ? ?Past Medical History:  ?Diagnosis Date  ? Allergic rhinitis, cause unspecified   ? Allergy   ? Basal cell carcinoma 10/04/1996  ? right chest-TX-DR.Turner  ? BCC (basal cell carcinoma) 12/05/2000  ? upper mid back [place]  ? BCC (basal cell carcinoma) 12/05/2000  ? Left upper back [place] Dr. Ronnald Ramp  ? BCC (basal cell carcinoma) 09/16/2001  ? left upper back [place]  ? BCC (basal cell carcinoma) 08/15/2003  ? left upper mid back T Dr.Jones  ? BCC (basal cell carcinoma) 08/15/2003  ? right upper mid back Dr. Ronnald Ramp  ? BCC (basal cell carcinoma) 08/15/2003  ? left medial breast TX Dr.jones  ? BCC (basal cell carcinoma) 10/12/2004  ? left anterior shin tx Dr.jones  ? BCC (basal cell carcinoma) 09/14/2007  ? right of sternum-Tx-CX27f  ? BCC (  basal cell carcinoma) 10/20/2012  ? left post sholder-tx CX35FU  ? BCC (basal cell carcinoma) 10/20/2012  ? left sholder tip-TX- CX35FU  ? BCC (basal cell carcinoma) 10/20/2012  ? left temple-TX CX35FU  ? BCC (basal cell carcinoma) 10/20/2012  ? left outer calf-TX cx69f  ? BCC (basal cell carcinoma) 10/20/2012  ? left upper arm-tx cx356f ? BCC (basal cell carcinoma) 10/20/2012  ? right sholder tip-tx cx3565f? BCC (basal cell carcinoma) 11/10/2013  ? right upper back-cx35f22f BCC (basal cell carcinoma) 06/07/2014  ? left inferior post sholder-TXpbx  ? BCC (basal cell carcinoma) 06/13/2015  ? righ post sholder-TXpbx  ? BCC (basal cell carcinoma) 06/13/2015  ? left upper  ankle  ? BCC (basal cell carcinoma) 03/18/2017  ? right outer back-txpbx  ? BCC (basal cell carcinoma) 03/18/2017  ? left trapezius-txpbx  ? BCC (basal cell carcinoma) 03/18/2017  ? left deltoid-txpbx  ? BCC (basal cell carcinoma) 06/05/2017  ? right rhomboid-txpbx  ? BCC (basal cell carcinoma) 04/28/2018  ? right tragus-txpbx  ? BCC (basal cell carcinoma) 06/09/2018  ? mid upper back txpbx  ? BCC (basal cell carcinoma) 03/09/2019  ? left deltoid inferior txpbx (exc 09/19/21)  ? BCC (basal cell carcinoma) 03/09/2019  ? left chest medial txpbx  ? CAD (coronary artery disease)   ? LHC 12/20/2014 showed small to moderate apical infarction due to distal LAD stenosis with reestablished TIMI2 flow. No PCI. Recommended plavix for 1 yr and ASA for life.     ? Chicken pox   ? Chronic bronchitis (HCC)[place]? Family history of breast cancer in first degree relative   ? sister age 76  85Heart murmur   ? Hyperlipidemia   ? Hypertension   ? Hypothyroidism   ? Malignant neoplasm skin of face 02/16/2007  ? Basal Cell Carcinoma  [place].  ? Measles   ? Melanoma (HCC)[place]/26/2019  ? left forearm-MIS TX- Excision  ? Migraine <1982  ? "went away when I got pregnant" (09/25/2017)  ? [facility]  ? Obesity, unspecified   ? Other chronic nonalcoholic liver disease   ? Pre-diabetes   ? SCCA (squamous cell carcinoma) of skin 01/02/2021  ? Left Lower Leg - Anterior (in situ) tx cx3 5fu 23fSCCA (squamous cell carcinoma) of skin 01/02/2021  ? Left Submandibular Area (in situ) tx cx 3 5fu  76fkin cancer 10/12/2004  ? right anterior shin-CIS-TX Dr.Jones  ? Skin cancer 03/10/2013  ? right lower shin-txpbx CIS  ? Skin cancer 01/18/2015  ? left neck-KA cx1 + Cautery  ? Skin cancer 02/28/2015  ? left neck-excision  ? Skin cancer 06/05/2017  ? right scapula-CIS  ? Skin cancer 04/28/2018  ? left ear helix-well diff scc-txpbx  ? Sleep related leg cramps   ? Stroke (cerebrum) (HCC)  [place] to large vessel disease; denies residual on  09/25/2017  ? Thyroid nodule   ? 10-2016   ? Unspecified vitamin D deficiency   ? Vision abnormalities   ? ?Past Surgical History:  ?Procedure Laterality Date  ? ATRIAL FIBRILLATION ABLATION  09/25/2017  ? ATRIAL FIBRILLATION ABLATION N/A 09/25/2017  ? Procedure: ATRIAL FIBRILLATION ABLATION;  Surgeon: AllredThompson Grayer [facility];  Service: Cardiovascular;  Laterality: N/A;  ? CARDIAC CATHETERIZATION N/A 12/20/2014  ? Procedure: Left Heart Cath and Coronary Angiography;  Surgeon: Mihai Sanda Klein [facility];  Service: Cardiovascular;  Laterality:  N/A;  ? COLONOSCOPY  06/25/2007  ? Hung. Normal. Repeat in ten years.  ? CYSTOSTOMY W/ BLADDER DILATION  1960s  ? "bladder stem stretched"  ? LAPAROSCOPIC CHOLECYSTECTOMY    ? LAPAROSCOPIC GASTRIC BANDING  07/03/10  ? Dr. Sherrin Daisy; [place]  ? LOOP RECORDER INSERTION N/A 08/23/2016  ? Procedure: Loop Recorder Insertion;  Surgeon: Deboraha Sprang, MD;  Location: [place] [facility];  Service: Cardiovascular;  Laterality: N/A;  ? SQUAMOUS CELL CARCINOMA EXCISION Left   ? neck  ? THYROIDECTOMY N/A 12/17/2016  ? Procedure: TOTAL THYROIDECTOMY;  Surgeon: Excell Seltzer, MD;  Location: [facility];  Service: General;  Laterality: N/A;  ? TONSILLECTOMY    ? ? ?Current Outpatient Medications  ?Medication Sig Dispense Refill  ? amoxicillin-clavulanate (AUGMENTIN) 875-125 MG tablet Take 1 tablet by mouth 2 (two) times daily.    ? atorvastatin (LIPITOR) 80 MG tablet Take 80 mg by mouth daily.    ? azelastine (ASTELIN) 0.1 % nasal spray Place 2 sprays into both nostrils as needed for rhinitis. Use in each nostril as directed    ? B COMPLEX-C-FOLIC ACID PO Take 1 tablet by mouth daily.    ? cholecalciferol (VITAMIN D) 400 units TABS tablet Take 2,000 Units by mouth daily.     ? CONTOUR NEXT TEST test strip AS DIRECTED 100 each 4  ? diltiazem (CARDIZEM CD) 240 MG 24 hr capsule Take 1 capsule (240 mg total) by mouth daily. 90 capsule 3  ? diltiazem  (CARDIZEM) 30 MG tablet TAKE 1 TABLET EVERY 4 HOURS AS NEEDED FOR HR >100 AND TOP BP>100 45 tablet 1  ? ELIQUIS 5 MG TABS tablet TAKE 1 TABLET BY MOUTH TWICE A DAY 180 tablet 2  ? fluconazole (DIFLUCAN) 150 MG tabl

## 2021-12-12 ENCOUNTER — Other Ambulatory Visit: Payer: Self-pay | Admitting: Gastroenterology

## 2021-12-12 DIAGNOSIS — R1013 Epigastric pain: Secondary | ICD-10-CM

## 2021-12-18 ENCOUNTER — Ambulatory Visit
Admission: RE | Admit: 2021-12-18 | Discharge: 2021-12-18 | Disposition: A | Discharge status: Home / Self Care | Payer: Medicare Other | Source: Ambulatory Visit | Attending: Gastroenterology | Admitting: Gastroenterology

## 2021-12-18 DIAGNOSIS — Z09 Encounter for follow-up examination after completed treatment for conditions other than malignant neoplasm: Secondary | ICD-10-CM

## 2021-12-18 DIAGNOSIS — R1013 Epigastric pain: Secondary | ICD-10-CM

## 2021-12-18 DIAGNOSIS — N6489 Other specified disorders of breast: Secondary | ICD-10-CM

## 2021-12-18 MED ORDER — IOPAMIDOL (ISOVUE-300) INJECTION 61%
100.0000 mL | Freq: Once | INTRAVENOUS | Status: AC | PRN
  Administered 2021-12-18: 100 mL via INTRAVENOUS

## 2022-01-02 ENCOUNTER — Encounter: Payer: Self-pay | Admitting: Physician Assistant

## 2022-01-02 ENCOUNTER — Ambulatory Visit (INDEPENDENT_AMBULATORY_CARE_PROVIDER_SITE_OTHER): Payer: Medicare Other | Admitting: Physician Assistant

## 2022-01-02 DIAGNOSIS — Z85828 Personal history of other malignant neoplasm of skin: Secondary | ICD-10-CM

## 2022-01-02 DIAGNOSIS — D485 Neoplasm of uncertain behavior of skin: Secondary | ICD-10-CM

## 2022-01-02 DIAGNOSIS — L570 Actinic keratosis: Secondary | ICD-10-CM | POA: Diagnosis not present

## 2022-01-02 DIAGNOSIS — Z1283 Encounter for screening for malignant neoplasm of skin: Secondary | ICD-10-CM | POA: Diagnosis not present

## 2022-01-02 DIAGNOSIS — C44719 Basal cell carcinoma of skin of left lower limb, including hip: Secondary | ICD-10-CM | POA: Diagnosis not present

## 2022-01-02 DIAGNOSIS — C44519 Basal cell carcinoma of skin of other part of trunk: Secondary | ICD-10-CM

## 2022-01-02 DIAGNOSIS — Z85820 Personal history of malignant melanoma of skin: Secondary | ICD-10-CM | POA: Diagnosis not present

## 2022-01-02 MED ORDER — TOLAK 4 % EX CREA: 1.0000 | TOPICAL_CREAM | Freq: Every evening | CUTANEOUS | 0 refills | Status: AC

## 2022-01-02 NOTE — Patient Instructions (Addendum)
[place] tomorrow if you do not hear from them by the end of the day today @ [phone]  Biopsy, Surgery (Curettage) & Surgery (Excision) Aftercare Instructions  1. Okay to remove bandage in 24 hours  2. Wash area with soap and water  3. Apply Vaseline to area twice daily until healed (Not Neosporin)  4. Okay to cover with a Band-Aid to decrease the chance of infection or prevent irritation from clothing; also it's okay to uncover lesion at home.  5. Suture instructions: return to our office in 7-10 or 10-14 days for a nurse visit for suture removal. Variable healing with sutures, if pain or itching occurs call our office. It's okay to shower or bathe 24 hours after sutures are given.  6. The following risks may occur after a biopsy, curettage or excision: bleeding, scarring, discoloration, recurrence, infection (redness, yellow drainage, pain or swelling).  7. For questions, concerns and results call our office at [place] before 4pm & Friday before 3pm. Biopsy results will be available in 1 week.

## 2022-01-14 ENCOUNTER — Other Ambulatory Visit: Payer: Medicare Other

## 2022-01-14 NOTE — Progress Notes (Signed)
Follow-Up Visit   Subjective  Elaine Jackson is a 66 y.o. female who presents for the following: Annual Exam (Patient here today for 3 month skin check, per patient she has several lesions that she would like checked. Per patient there is bleeding when she picks the lesions. Personal history of melanoma and non mole skin cancer. ).   The following portions of the chart were reviewed this encounter and updated as appropriate:  Tobacco  Allergies  Meds  Problems  Med Hx  Surg Hx  Fam Hx      Objective  Well appearing patient in no apparent distress; mood and affect are within normal limits.  A full examination was performed including scalp, head, eyes, ears, nose, lips, neck, chest, axillae, abdomen, back, buttocks, bilateral upper extremities, bilateral lower extremities, hands, feet, fingers, toes, fingernails, and toenails. All findings within normal limits unless otherwise noted below.  Left Inguinal Area Hyperkeratotic scale with pink base        Head - Anterior (Face) (6) Erythematous patches with gritty scale.  Left Popliteal Fossa Scaly erythematous macule        Right Mid Back Scaly erythematous macule         Assessment & Plan  Neoplasm of uncertain behavior of skin Left Inguinal Area  Skin / nail biopsy Type of biopsy: tangential   Informed consent: discussed and consent obtained   Timeout: patient name, date of birth, surgical site, and procedure verified   Anesthesia: the lesion was anesthetized in a standard fashion   Anesthetic:  1% lidocaine w/ epinephrine 1-100,000 local infiltration Instrument used: flexible razor blade   Hemostasis achieved with: aluminum chloride   Outcome: patient tolerated procedure well   Post-procedure details: wound care instructions given    Specimen 3 - Surgical pathology Differential Diagnosis: R/O BCC vs SCC  Check Margins: yes  AK (actinic keratosis) (6) Head - Anterior (Face)  Destruction of lesion  - Head - Anterior (Face)  Related Medications Fluorouracil (TOLAK) 4 % CREA Apply 1 Application topically at bedtime.  BCC (basal cell carcinoma), leg, left Left Popliteal Fossa  Skin / nail biopsy Type of biopsy: tangential   Informed consent: discussed and consent obtained   Timeout: patient name, date of birth, surgical site, and procedure verified   Anesthesia: the lesion was anesthetized in a standard fashion   Anesthetic:  1% lidocaine w/ epinephrine 1-100,000 local infiltration Instrument used: flexible razor blade   Hemostasis achieved with: aluminum chloride and electrodesiccation   Outcome: patient tolerated procedure well   Post-procedure details: wound care instructions given    Destruction of lesion Complexity: simple   Destruction method: electrodesiccation and curettage   Informed consent: discussed and consent obtained   Timeout:  patient name, date of birth, surgical site, and procedure verified Anesthesia: the lesion was anesthetized in a standard fashion   Anesthetic:  1% lidocaine w/ epinephrine 1-100,000 local infiltration Curettage performed in three different directions: Yes   Electrodesiccation performed over the curetted area: Yes   Curettage cycles:  3 Margin per side (cm):  0.1 Final wound size (cm):  1 Hemostasis achieved with:  aluminum chloride and electrodesiccation Outcome: patient tolerated procedure well with no complications   Post-procedure details: sterile dressing applied and wound care instructions given   Dressing type: bandage and petrolatum    Specimen 2 - Surgical pathology Differential Diagnosis: R/O BCC vs SCC - treated after biopsy  Check Margins: yes  BCC (basal cell carcinoma), back Right Mid  Back  Skin / nail biopsy Type of biopsy: tangential   Informed consent: discussed and consent obtained   Timeout: patient name, date of birth, surgical site, and procedure verified   Anesthesia: the lesion was anesthetized in a  standard fashion   Anesthetic:  1% lidocaine w/ epinephrine 1-100,000 local infiltration Instrument used: flexible razor blade   Hemostasis achieved with: aluminum chloride and electrodesiccation   Outcome: patient tolerated procedure well   Post-procedure details: wound care instructions given    Destruction of lesion Complexity: simple   Destruction method: electrodesiccation and curettage   Informed consent: discussed and consent obtained   Timeout:  patient name, date of birth, surgical site, and procedure verified Anesthesia: the lesion was anesthetized in a standard fashion   Anesthetic:  1% lidocaine w/ epinephrine 1-100,000 local infiltration Curettage performed in three different directions: Yes   Electrodesiccation performed over the curetted area: Yes   Curettage cycles:  3 Margin per side (cm):  0.1 Final wound size (cm):  1.5 Hemostasis achieved with:  aluminum chloride Outcome: patient tolerated procedure well with no complications   Post-procedure details: wound care instructions given    Specimen 1 - Surgical pathology Differential Diagnosis: R/O BCC vs SCC - treated after biopsy  Check Margins: yes    I, Loveta Dellis, PA-C, have reviewed all documentation's for this visit.  The documentation on 01/14/22 for the exam, diagnosis, procedures and orders are all accurate and complete.

## 2022-02-05 ENCOUNTER — Ambulatory Visit: Payer: Medicare Other | Admitting: Physician Assistant

## 2022-03-26 ENCOUNTER — Encounter: Payer: Self-pay | Admitting: Cardiology

## 2022-03-26 ENCOUNTER — Ambulatory Visit: Payer: Medicare Other | Attending: Cardiology | Admitting: Cardiology

## 2022-03-26 VITALS — BP 110/70 | HR 62 | Ht 62.5 in | Wt 198.0 lb

## 2022-03-26 DIAGNOSIS — I2510 Atherosclerotic heart disease of native coronary artery without angina pectoris: Secondary | ICD-10-CM | POA: Insufficient documentation

## 2022-03-26 DIAGNOSIS — R002 Palpitations: Secondary | ICD-10-CM | POA: Insufficient documentation

## 2022-03-26 DIAGNOSIS — I480 Paroxysmal atrial fibrillation: Secondary | ICD-10-CM | POA: Insufficient documentation

## 2022-03-26 NOTE — Progress Notes (Signed)
Cardiology Office Note:    Date:  03/26/2022   ID:  Elaine Jackson, DOB [birth year], MRN [id number]  PCP:  [name], MD   [facility] Providers Cardiologist:  None     Referring MD: [name], MD     History of Present Illness:    Elaine Jackson is a 66 y.o. female here for the follow-up of paroxysmal atrial fibrillation post ablation April 2019, coronary disease small distal LAD, prior stroke.  Pt was doing well at her last visit with me on 11/01/20. She was last seen by EP Dr. Rayann Heman on 10/05/21 and was doing well overall but had noted some episodes of palpitations with associated dizziness. Her Cardizem CD was increased to 256m daily at that time.  She had atrial fibrillation after steroid injection for her injured foot. She states that she has had 1 steroid injection after that but is unaware of any recurrence of A-fib with this.  Today, patient states that she is doing very well overall.  She reminds me that she has an abnormal dry sensation in her throat and auras when she is in A-fib. She has not had these symptoms since starting Cardizem. She denies any bleeding problems with Eliquis. She does have questions as to whether or not she will need to be on Eliquis lifelong. We discussed the Watchman device and common indications for this procedure.  She denies any shortness of breath. No lightheadedness, headaches, orthopnea, or PND. Also has no lower extremity edema or exertional symptoms.   Past Medical History:  Diagnosis Date   Allergic rhinitis, cause unspecified    Allergy    Basal cell carcinoma 10/04/1996   right chest-TX-DR.Turner   BCC (basal cell carcinoma) 12/05/2000   upper mid back [place]  BCC (basal cell carcinoma) 12/05/2000   Left upper back TX Dr. [facility]nald [facility]amp  [facility]   left upper back TX Dr.Jones   BCC (basal cell carcinoma) 08/15/2003   left upper mid back T Dr.Jones   BCC (basal cell carcinoma) 08/15/2003    right upper mid back [facility]. [facility]nald [facility]amp  [facility]   left medial breast TX Dr.jones   BCC (basal cell carcinoma) 10/12/2004   left anterior shin tx Dr.jones   BCC (basal cell carcinoma) 09/14/2007   right of sternum-Tx-CX333f  BCC (basal cell carcinoma) 10/20/2012   left post sholder-tx CX35FU   BCC (basal cell carcinoma) 10/20/2012   left sholder tip-TX- CX35FU   BCC (basal cell carcinoma) 10/20/2012   left temple-TX CX35FU   BCC (basal cell carcinoma) 10/20/2012   left outer calf-TX [id number] BCC (basal cell carcinoma) 10/20/2012   left upper arm-tx [id number] (basal cell carcinoma) 10/20/2012   right sholder tip-tx [id number] (basal cell carcinoma) 11/10/2013   right upper back-cx35fu 42fC (basal cell carcinoma) 06/07/2014   left inferior post sholder-TXpbx   BCC (basal cell carcinoma) 06/13/2015   righ post sholder-TXpbx   BCC (basal cell carcinoma) 06/13/2015   left upper ankle   BCC (basal cell carcinoma) 03/18/2017   right outer back-txpbx   BCC (basal cell carcinoma) 03/18/2017   left trapezius-txpbx   BCC (basal cell carcinoma) 03/18/2017   left deltoid-txpbx   BCC (basal cell carcinoma) 06/05/2017   right rhomboid-txpbx   BCC (basal cell carcinoma) 04/28/2018   right tragus-txpbx   BCC (basal cell carcinoma) 06/09/2018   mid upper back  txpbx   BCC (basal cell carcinoma) 03/09/2019   left deltoid inferior txpbx (exc 09/19/21)   BCC (basal cell carcinoma) 03/09/2019   left chest medial txpbx   CAD (coronary artery disease)    LHC 12/20/2014 showed small to moderate apical infarction due to distal LAD stenosis with reestablished TIMI2 flow. No PCI. Recommended plavix for 1 yr and ASA for life.      Chicken pox    Chronic bronchitis (HCC)    Family history of breast cancer in first degree relative    sister age 47   Heart murmur    Hyperlipidemia    Hypertension    Hypothyroidism    Malignant neoplasm skin of face 02/16/2007   Basal  Cell Carcinoma  [place].   Measles    Melanoma ([place]) 12/17/2017   left forearm-MIS TX- Excision   Migraine <1982   "went away when I got pregnant" (09/25/2017)   Myocardial infarction ([place]) 01/2015   Obesity, unspecified    Other chronic nonalcoholic liver disease    Pre-diabetes    SCCA (squamous cell carcinoma) of skin 01/02/2021   Left Lower Leg - Anterior (in situ) tx cx3 52f   SCCA (squamous cell carcinoma) of skin 01/02/2021   Left Submandibular Area (in situ) tx cx 3 574f  Skin cancer 10/12/2004   right anterior shin-CIS-TX Dr.Jones   Skin cancer 03/10/2013   right lower shin-txpbx CIS   Skin cancer 01/18/2015   left neck-KA cx1 + Cautery   Skin cancer 02/28/2015   left neck-excision   Skin cancer 06/05/2017   right scapula-CIS   Skin cancer 04/28/2018   left ear helix-well diff scc-txpbx   Sleep related leg cramps    Stroke (cerebrum) ([facility]   due to large vessel disease; denies residual on 09/25/2017   Thyroid nodule    10-2016    Unspecified vitamin D deficiency    Vision abnormalities     Past Surgical History:  Procedure Laterality Date   ATRIAL FIBRILLATION ABLATION  09/25/2017   ATRIAL FIBRILLATION ABLATION N/A 09/25/2017   Procedure: ATRIAL FIBRILLATION ABLATION;  Surgeon: AlThompson GrayerMD;  [facility];  Service: Cardiovascular;  Laterality: N/A;   CARDIAC CATHETERIZATION N/A 12/20/2014   Procedure: Left Heart Cath and Coronary Angiography;  Surgeon: MiSanda KleinMD;  [facility];  Service: Cardiovascular;  Laterality: N/A;   COLONOSCOPY  06/25/2007   [place]. Normal. Repeat in ten years.   CYSTOSTOMY W/ BLADDER DILATION  1960s   "bladder stem stretched"   LAPAROSCOPIC CHOLECYSTECTOMY     LAPAROSCOPIC GASTRIC BANDING  07/03/10   Dr. HoSherrin DaisyWesley Long   LOOP RECORDER INSERTION N/A 08/23/2016   Procedure: Loop Recorder Insertion;  Surgeon: StDeboraha SprangMD;  [facility];  Service: Cardiovascular;   Laterality: N/A;   SQUAMOUS CELL CARCINOMA EXCISION Left    neck   THYROIDECTOMY N/A 12/17/2016   Procedure: TOTAL THYROIDECTOMY;  Surgeon: HoExcell SeltzerMD;  Location: [facility];  Service: General;  Laterality: N/A;   TONSILLECTOMY      Current Medications: Current Meds  Medication Sig   atorvastatin (LIPITOR) 80 MG tablet Take 80 mg by mouth daily.   azelastine (ASTELIN) 0.1 % nasal spray Place 2 sprays into both nostrils as needed for rhinitis. Use in each nostril as directed   B COMPLEX-C-FOLIC ACID PO Take 1 tablet by mouth daily.   cholecalciferol (VITAMIN D) 400 units TABS tablet Take 2,000 Units by  mouth daily.    CONTOUR NEXT TEST test strip AS DIRECTED   diltiazem (CARDIZEM CD) 240 MG 24 hr capsule Take 1 capsule (240 mg total) by mouth daily.   ELIQUIS 5 MG TABS tablet TAKE 1 TABLET BY MOUTH TWICE A DAY   Fluorouracil (TOLAK) 4 % CREA Apply 1 Application topically at bedtime.   fluticasone (FLONASE) 50 MCG/ACT nasal spray Place 2 sprays into both nostrils daily as needed for allergies.   furosemide (LASIX) 20 MG tablet Take 20 mg by mouth as needed.   levothyroxine (SYNTHROID) 112 MCG tablet Take 112 mcg by mouth daily.   montelukast (SINGULAIR) 10 MG tablet Take 10 mg by mouth daily.   Multiple Vitamins-Minerals (MULTIVITAMIN ADULT EXTRA C PO) Multivitamin   omeprazole (PRILOSEC) 40 MG capsule Take 40 mg by mouth daily.   potassium chloride SA (K-DUR,KLOR-CON) 20 MEQ tablet Take 20 mEq by mouth as needed. Take with lasix.   valsartan (DIOVAN) 80 MG tablet TAKE 1 TABLET BY MOUTH EVERY DAY     Allergies:   Sulfa antibiotics and Zofran [ondansetron hcl]   Social History   Socioeconomic History   Marital status: Married    Spouse name: Not on file   Number of children: 1   Years of education: Not on file   Highest education level: Not on file  Occupational History   Occupation: HAIR STYLIST    Employer: [employer]  Tobacco Use   Smoking status: Former     Packs/day: 1.00    Years: 25.00    Total pack years: 25.00    Types: Cigarettes    Quit date: 03/26/1995    Years since quitting: 27.0   Smokeless tobacco: Never  Vaping Use   Vaping Use: Never used  Substance and Sexual Activity   Alcohol use: Not Currently   Drug use: No   Sexual activity: Not Currently    Birth control/protection: Post-menopausal  Other Topics Concern   Not on file  Social History Narrative   Marital status:  Married; x 27 years. Second marriage. 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      Tobacco:  None      Alcohol: rare/minimal.      Drugs:  None       Exercise:        Seatbelt:  100%.       Sunscreen: SPF 15.      Guns:  Maybe?   Social Determinants of Health   Financial Resource Strain: Not on file  Food Insecurity: Not on file  Transportation Needs: Not on file  Physical Activity: Not on file  Stress: Not on file  Social Connections: Not on file     Family History: The patient's family history includes Arthritis in her brother; Breast cancer (age of onset: 70) in her sister; Cancer in her sister; Cervical cancer in her sister; Congestive Heart Failure in her father and mother; Dementia in her mother; Diabetes in her brother, father, mother, and sister; Heart disease in her father, maternal grandfather, mother, paternal grandfather, and paternal grandmother; Hepatitis C (age of onset: 20) in her sister; Hyperlipidemia in her brother, father, and mother; Hypertension in her brother, father, mother, sister, and sister; Kidney disease in her father and sister; Kidney failure in her father; Liver  cancer (age of onset: 93) in her sister; Stroke in her father, maternal grandmother, paternal grandfather, and paternal grandmother. There is no history of Thyroid disease.  ROS:   Please see the history of  present illness.  All other systems are reviewed and negative.   EKGs/Labs/Other Studies Reviewed:    The following studies were reviewed today:   Cardiac catheterization 2016: Dist LAD lesion, 90% stenosed. Apical infarction with preserved overall LVEF IMPRESSIONS:  Single vessel CAD. Small to moderate apical infarction due to a distal LAD stenosis with reestablished (TIMI2) flow  RECOMMENDATION:  Medical therapy with statin, lifelong ASA, dual antiplatelet therapy for 12 months.  Prefer ARB/ACEi for BP control.  Beta blockers would be helpful, but precluded by baseline bradycardia.  Aggressive control of risk factors, especially DM.   Echocardiogram 10/12/21  1. Left ventricular ejection fraction, by estimation, is 65 to 70%. Left  ventricular ejection fraction by 3D volume is 73 %. The left ventricle has  normal function. The left ventricle has no regional wall motion  abnormalities. Left ventricular diastolic   parameters are consistent with Grade II diastolic dysfunction  (pseudonormalization).   2. Right ventricular systolic function is normal. The right ventricular  size is mildly enlarged. There is mildly elevated pulmonary artery  systolic pressure.   3. Left atrial size was severely dilated.   4. The mitral valve is grossly normal. No evidence of mitral valve  regurgitation. The mean mitral valve gradient is 1.9 mmHg with average  heart rate of 67 bpm.   5. The aortic valve is tricuspid. Aortic valve regurgitation is mild. No  aortic stenosis is present.   6. The inferior vena cava is dilated in size with >50% respiratory  variability, suggesting right atrial pressure of 8 mmHg.    Recent Labs: No results found for requested labs within last 365 days.  Recent Lipid Panel    Component Value Date/Time   CHOL 136 11/01/2020 0926   TRIG 83 11/01/2020 0926   HDL 50 11/01/2020 0926   CHOLHDL 2.7 11/01/2020 0926   CHOLHDL 2.4 08/21/2016 0147   VLDL 11 08/21/2016  0147   LDLCALC 70 11/01/2020 0926   Lab Results  Component Value Date   WBC 7.4 11/01/2020   HGB 12.4 11/01/2020   HCT 37.2 11/01/2020   MCV 94 11/01/2020   PLT 275 85/88/5027   Last metabolic panel Lab Results  Component Value Date   GLUCOSE 109 (H) 11/01/2020   NA 142 11/01/2020   K 4.8 11/01/2020   CL 101 11/01/2020   CO2 26 11/01/2020   BUN 18 11/01/2020   CREATININE 0.89 11/01/2020   EGFR 72 11/01/2020   CALCIUM 10.0 11/01/2020   PROT 6.5 11/01/2020   ALBUMIN 4.5 11/01/2020   LABGLOB 2.0 11/01/2020   AGRATIO 2.3 (H) 11/01/2020   BILITOT 0.6 11/01/2020   ALKPHOS 70 11/01/2020   AST 16 11/01/2020   ALT 21 11/01/2020   ANIONGAP 7 12/18/2016   Lab Results  Component Value Date   HGBA1C 5.9 (H) 12/22/2017     Risk Assessment/Calculations:      Physical Exam:    VS:  BP 110/70 (BP Location: Left Arm, Patient Position: Sitting, Cuff Size: Normal)   Pulse 62   Ht 5' 2.5" (1.588 m)   Wt 198 lb (89.8 kg)   SpO2 97%   BMI 35.64 kg/m     Wt Readings from Last 3 Encounters:  03/26/22 198 lb (89.8 kg)  11/06/21 198 lb 9.6  oz (90.1 kg)  10/05/21 197 lb 3.2 oz (89.4 kg)     GEN:  Well nourished, well developed in no acute distress HEENT: Normal NECK: No JVD; No carotid bruits LYMPHATICS: No lymphadenopathy CARDIAC: RRR, 1/6 systolic murmur in right upper sternal border, No rubs or gallops RESPIRATORY:  Clear to auscultation without rales, wheezing or rhonchi  ABDOMEN: Soft, non-tender, non-distended MUSCULOSKELETAL:  No edema; No deformity  SKIN: Warm and dry NEUROLOGIC:  Alert and oriented x 3 PSYCHIATRIC:  Normal affect   ASSESSMENT:    1. Paroxysmal atrial fibrillation (HCC)   2. Palpitations   3. Coronary artery disease involving native coronary artery of native heart without angina pectoris     PLAN:    In order of problems listed above:  Paroxysmal atrial fibrillation - Ablation 2019 Dr. Rayann Heman - Maintaining sinus rhythm - Has Cardizem  259m daily.  30 mg as needed -Her prior symptoms with atrial fibrillation were Aura that she felt, dry sensation in her throat. -Prior episode post ablation after steroid injection.  Very rare. -She did ask if she would need to be on Eliquis lifelong.  We did have discussion about potential Watchman device.  She does fill out patient assistance form yearly to obtain medication.  Chronic anticoagulation - On Eliquis 5 mg twice a day, CHA2DS2-VASc score of at least 6.  No bleeding.  Hemoglobin 12.4 creatinine 0.9  Stroke in 2018 - No residual symptoms.  Eliquis.  Coronary artery disease - Prior MI, no anginal symptoms.  No PCI was performed previously.  Distal LAD medically managed  Hyperlipidemia - Continue with statin therapy.  Recent LDL from outside labs was 70 hemoglobin A1c 5.8 ALT 18 creatinine 0.9  Return in about 1 year (around 03/27/2023).    Medication Adjustments/Labs and Tests Ordered: Current medicines are reviewed at length with the patient today.  Concerns regarding medicines are outlined above.  No orders of the defined types were placed in this encounter.  No orders of the defined types were placed in this encounter.   Patient Instructions  Medication Instructions:  The current medical regimen is effective;  continue present plan and medications.  *If you need a refill on your cardiac medications before your next appointment, please call your pharmacy*  Follow-Up: [facility] and your health needs are our priority.  As part of our continuing mission to provide you with exceptional heart care, we have created designated Provider Care Teams.  These Care Teams include your primary Cardiologist (physician) and Advanced Practice Providers (APPs -  Physician Assistants and Nurse Practitioners) who all work together to provide you with the care you need, when you need it.  We recommend signing up for the patient portal called "MyChart".  Sign up  information is provided on this After Visit Summary.  MyChart is used to connect with patients for Virtual Visits (Telemedicine).  Patients are able to view lab/test results, encounter notes, upcoming appointments, etc.  Non-urgent messages can be sent to your provider as well.   To learn more about what you can do with MyChart, go to [email or web address]    Your next appointment:   1 year(s)  The format for your next appointment:   In Person  Provider:   Dr MCandee Furbish     Important Information About Sugar         This document serves as a record of services personally performed by MCandee Furbish MD. It was created on her behalf by  Eugene Gavia, a trained medical scribe. The creation of this record is based on the scribe's personal observations and the provider's statements to them. This document has been checked and approved by the attending provider.  ICandee Furbish, MD, have reviewed all documentation for this visit. The documentation on 03/26/22 for the exam, diagnosis, procedures, and orders are all accurate and complete.   Signed, Candee Furbish, MD  03/26/2022 10:02 AM    [place]

## 2022-03-26 NOTE — Patient Instructions (Signed)
Medication Instructions:  The current medical regimen is effective;  continue present plan and medications.  *If you need a refill on your cardiac medications before your next appointment, please call your pharmacy*  Follow-Up: At [facility], you and your health needs are our priority.  As part of our continuing mission to provide you with exceptional heart care, we have created designated Provider Care Teams.  These Care Teams include your primary Cardiologist (physician) and Advanced Practice Providers (APPs -  Physician Assistants and Nurse Practitioners) who all work together to provide you with the care you need, when you need it.  We recommend signing up for the patient portal called "MyChart".  Sign up information is provided on this After Visit Summary.  MyChart is used to connect with patients for Virtual Visits (Telemedicine).  Patients are able to view lab/test results, encounter notes, upcoming appointments, etc.  Non-urgent messages can be sent to your provider as well.   To learn more about what you can do with MyChart, go to [email or web address].    Your next appointment:   1 year(s)  The format for your next appointment:   In Person  Provider:   Dr Mark Skains  Important Information About Sugar       

## 2022-04-03 ENCOUNTER — Ambulatory Visit: Payer: Medicare Other | Admitting: Physician Assistant

## 2022-04-05 ENCOUNTER — Ambulatory Visit
Admission: RE | Admit: 2022-04-05 | Discharge: 2022-04-05 | Disposition: A | Discharge status: Home / Self Care | Payer: Medicare Other | Source: Ambulatory Visit | Attending: Family Medicine | Admitting: Family Medicine

## 2022-04-05 DIAGNOSIS — Z09 Encounter for follow-up examination after completed treatment for conditions other than malignant neoplasm: Secondary | ICD-10-CM

## 2022-04-05 DIAGNOSIS — N6489 Other specified disorders of breast: Secondary | ICD-10-CM

## 2022-08-01 ENCOUNTER — Encounter (HOSPITAL_COMMUNITY): Payer: Self-pay | Admitting: *Deleted

## 2022-08-12 ENCOUNTER — Telehealth: Payer: Self-pay | Admitting: Cardiology

## 2022-08-12 NOTE — Telephone Encounter (Signed)
**Note De-Identified Elaine Jackson Obfuscation** The pt is requesting that we fax a completed and signed providers page of a BMSPAF application for Eliquis to [employer] for her. She states that she is in the process of obtaining her proof of income and that once she gets it she is sending her part of her BMSPAF application to them herself.  I have completed the providers page and have e-mailed it to Dr Marlou Porch nurse so she can obtain our DOD, Dr Elmarie Shiley signature in Dr Marlou Porch absence, to date it, and to the fax it to [facility] at the fax number written on the cover letter included.

## 2022-08-12 NOTE — Telephone Encounter (Signed)
Pt c/o medication issue:  1. Name of Medication:   ELIQUIS 5 MG TABS tablet    2. How are you currently taking this medication (dosage and times per day)? As written   3. Are you having a reaction (difficulty breathing--STAT)?   4. What is your medication issue? Pt called in stating Elaine Jackson fax over pt assistance renewal and she is calling in for an update on if it was received and completed yet.

## 2022-08-12 NOTE — Telephone Encounter (Signed)
Paperwork printed, signed by Dr Acie Fredrickson and faxed to # on cover sheet as requested.

## 2022-08-28 ENCOUNTER — Other Ambulatory Visit: Payer: Self-pay | Admitting: *Deleted

## 2022-08-28 ENCOUNTER — Encounter: Payer: Self-pay | Admitting: Cardiology

## 2022-08-28 DIAGNOSIS — I480 Paroxysmal atrial fibrillation: Secondary | ICD-10-CM

## 2022-08-28 MED ORDER — APIXABAN 5 MG PO TABS: 5.0000 mg | ORAL_TABLET | Freq: Two times a day (BID) | ORAL | 1 refills | Status: DC

## 2022-08-28 NOTE — Telephone Encounter (Signed)
Eliquis '5mg'$  refill request received. Patient is 67 years old, weight-89.8kg, Crea-1.0 on 04/08/22 from [place] at [place], [place], and last seen by Dr. Marlou Porch on 03/26/22. Dose is appropriate based on dosing criteria. Will send in refill to requested pharmacy.

## 2022-09-03 ENCOUNTER — Telehealth: Payer: Self-pay

## 2022-09-03 NOTE — Telephone Encounter (Signed)
**Note De-Identified Joua Bake Obfuscation** Please see the [facility] message in the pts chart from 3/11 for more details.  The pt states that she called the phone number provided by [employer] to apply for the "[place]" [facility] program and was advised that it could take 2 weeks for her to receive their application in the mail.  I advised the pt to call [employer] [place] at [phone]. [phone] and to specify that she needs [facility] assistance. She verbalized understanding and stated that she has enough Eliquis on hand to last until the end of this month.  She is aware to call us back if she needs further assistance with this and/or she gets down to just a couple days worth of Eliquis left on hand, we will provide her samples if we have them at that time.  She thanked me for calling her to discuss.

## 2022-09-03 NOTE — Telephone Encounter (Signed)
**Note De-Identified Elaine Jackson Obfuscation** Per Darvelle at [facility] they have denied the pt Eliquis assistance because she needs to apply for "[place]" through the [facility] program and that if they deny her assistance then she needs to mail [employer] a denial letter from [facility] and they will then reconsider her Eliquis application.  I have made the pt aware of this and I provided her with phone numbers to reach out to the [facility] program [name] her [facility] message from 08/28/2022.

## 2022-09-13 MED ORDER — APIXABAN 5 MG PO TABS: 5.0000 mg | ORAL_TABLET | Freq: Two times a day (BID) | ORAL | 0 refills | Status: DC

## 2022-09-13 NOTE — Addendum Note (Signed)
Addended by: Vergia Alcon A on: 09/13/2022 10:06 AM   Modules accepted: Orders

## 2022-09-26 ENCOUNTER — Other Ambulatory Visit: Payer: Self-pay

## 2022-09-26 ENCOUNTER — Encounter: Payer: Self-pay | Admitting: Neurology

## 2022-09-26 DIAGNOSIS — R202 Paresthesia of skin: Secondary | ICD-10-CM

## 2022-10-18 ENCOUNTER — Telehealth: Payer: Self-pay

## 2022-10-18 NOTE — Telephone Encounter (Signed)
This is a A-Fib clinic pt. Please address 

## 2022-10-20 ENCOUNTER — Encounter: Payer: Self-pay | Admitting: Cardiology

## 2022-10-21 MED ORDER — DILTIAZEM HCL ER COATED BEADS 240 MG PO CP24: 240.0000 mg | ORAL_CAPSULE | Freq: Every day | ORAL | 3 refills | Status: DC

## 2022-10-22 ENCOUNTER — Other Ambulatory Visit: Payer: Self-pay

## 2022-10-22 MED ORDER — VALSARTAN 80 MG PO TABS: 80.0000 mg | ORAL_TABLET | Freq: Every day | ORAL | 1 refills | Status: DC

## 2022-10-29 ENCOUNTER — Ambulatory Visit (INDEPENDENT_AMBULATORY_CARE_PROVIDER_SITE_OTHER): Payer: Medicare Other | Admitting: Neurology

## 2022-10-29 DIAGNOSIS — G5603 Carpal tunnel syndrome, bilateral upper limbs: Secondary | ICD-10-CM

## 2022-10-29 DIAGNOSIS — R202 Paresthesia of skin: Secondary | ICD-10-CM | POA: Diagnosis not present

## 2022-10-29 NOTE — Procedures (Signed)
[facility]  [address] [place], [address]  [place], [place] [address] Tel: [phone] Fax: [phone] Test Date:  10/29/2022  Patient: Elaine Jackson DOB: 11-Jul-1955 Physician: Jacquelyne Balint, MD  Sex: Female Height: 5' 2.5" Ref Phys: Ramond Marrow, MD  ID#: [id number]   Technician:    History: This is a 67 year old female with bilateral hand pain.  NCV & EMG Findings: Extensive electrodiagnostic evaluation of the right and left upper limbs shows: Right median sensory response shows prolonged distal peak latency (4.8 ms) and reduced amplitude (7 V).  Left median sensory response shows prolonged distal peak latency (4.2 ms). Bilateral ulnar and radial sensory responses are within normal limits. Right median (APB) motor response shows prolonged distal onset latency (4.9 ms) and reduced amplitude (4.6 mV). Left median (APB) and bilateral ulnar (ADM) motor responses are within normal limits. Chronic motor axon loss changes without accompanying active denervation changes are seen in the right abductor pollicis brevis muscle. All other tested muscles are within normal limits with normal motor unit configuration and recruitment patterns.  Impression: This is an abnormal study. The findings are most consistent with the following: Evidence of bilateral median mononeuropathy at or distal to the wrist, consistent with carpal tunnel syndrome. The findings are moderate in degree electrically on the right and mild in degree electrically on the left. No electrodiagnostic evidence of a right or left cervical (C5-T1) motor radiculopathy. Screening studies for right or left ulnar or radial mononeuropathies are normal.    ___________________________ Jacquelyne Balint, MD    Nerve Conduction Studies Motor Nerve Results    Latency Amplitude F-Lat Segment Distance CV Comment  Site (ms) Norm (mV) Norm (ms)  (cm) (m/s) Norm   Left Median (APB) Motor  Wrist 3.5  < 4.0 6.2  > 5.0        Elbow 7.8 - 5.9 -   Elbow-Wrist 23.5 55  > 50   Right Median (APB) Motor  Wrist *4.9  < 4.0 *4.6  > 5.0        Elbow 8.9 - 4.4 -  Elbow-Wrist 22 55  > 50   Left Ulnar (ADM) Motor  Wrist 1.78  < 3.1 8.0  > 7.0        Bel elbow 4.9 - 7.7 -  Bel elbow-Wrist 19 61  > 50   Ab elbow 6.6 - 7.4 -  Ab elbow-Bel elbow 10 59 -   Right Ulnar (ADM) Motor  Wrist 1.75  < 3.1 10.0  > 7.0        Bel elbow 4.9 - 9.5 -  Bel elbow-Wrist 20 63  > 50   Ab elbow 6.5 - 9.2 -  Ab elbow-Bel elbow 10 63 -    Sensory Sites    Neg Peak Lat Amplitude (O-P) Segment Distance Velocity Comment  Site (ms) Norm (V) Norm  (cm) (ms)   Left Median Sensory  Wrist-Dig II *4.2  < 3.8 18  > 10 Wrist-Dig II 13    Right Median Sensory  Wrist-Dig II *4.8  < 3.8 *7  > 10 Wrist-Dig II 13    Left Radial Sensory  Forearm-Wrist 2.0  < 2.8 24  > 10 Forearm-Wrist 10    Right Radial Sensory  Forearm-Wrist 1.85  < 2.8 29  > 10 Forearm-Wrist 10    Left Ulnar Sensory  Wrist-Dig V 2.5  < 3.2 33  > 5 Wrist-Dig V 11    Right Ulnar Sensory  Wrist-Dig V 2.4  <  3.2 28  > 5 Wrist-Dig V 11     Electromyography   Side Muscle Ins.Act Fibs Fasc Recrt Amp Dur Poly Activation Comment  Left FDI Nml Nml Nml Nml Nml Nml Nml Nml N/A  Left EIP Nml Nml Nml Nml Nml Nml Nml Nml N/A  Left APB Nml Nml Nml Nml Nml Nml Nml Nml N/A  Left Pronator teres Nml Nml Nml Nml Nml Nml Nml Nml N/A  Left Biceps Nml Nml Nml Nml Nml Nml Nml Nml N/A  Left Triceps Nml Nml Nml Nml Nml Nml Nml Nml N/A  Left Deltoid Nml Nml Nml Nml Nml Nml Nml Nml N/A  Right FDI Nml Nml Nml Nml Nml Nml Nml Nml N/A  Right EIP Nml Nml Nml Nml Nml Nml Nml Nml N/A  Right APB Nml Nml Nml *1- *1+ *1+ *1+ Nml N/A  Right Pronator teres Nml Nml Nml Nml Nml Nml Nml Nml N/A  Right Biceps Nml Nml Nml Nml Nml Nml Nml Nml N/A  Right Triceps Nml Nml Nml Nml Nml Nml Nml Nml N/A  Right Deltoid Nml Nml Nml Nml Nml Nml Nml Nml N/A      Waveforms:  Motor           Sensory

## 2022-10-30 NOTE — Telephone Encounter (Signed)
**Note De-identified Elaine Jackson Obfuscation** Letter received from [employer] stating that they have approved the pt for Eliquis assistance until 06/24/2023. [id number]  The letter states that they have notified the pt of this approval as well. 

## 2022-10-30 NOTE — Telephone Encounter (Signed)
**Note De-Identified Elaine Jackson Obfuscation** Letter received from [facility] stating that they have approved the pt for Eliquis assistance until 06/24/2023. [id number]  The letter states that they have notified the pt of this approval as well.

## 2023-01-20 ENCOUNTER — Telehealth: Payer: Self-pay | Admitting: *Deleted

## 2023-01-20 NOTE — Telephone Encounter (Signed)
Patient with diagnosis of afib on Eliquis for anticoagulation.    Procedure: colonoscopy Date of procedure: 02/11/23   CHA2DS2-VASc Score = 7   This indicates a 11.2% annual risk of stroke. The patient's score is based upon: CHF History: 0 HTN History: 1 Diabetes History: 1 Stroke History: 2 Vascular Disease History: 1 Age Score: 1 Gender Score: 1      CrCl 52 ml/min  Per office protocol, patient can hold Eliquis for 1-2 days prior to procedure.    **This guidance is not considered finalized until pre-operative APP has relayed final recommendations.**

## 2023-01-20 NOTE — Telephone Encounter (Signed)
   Pre-operative Risk Assessment    Patient Name: Elaine Jackson  DOB: 04/05/1956 MRN: [id number]      Request for Surgical Clearance    Procedure:   COLONOSCOPY  Date of Surgery:  Clearance 02/11/23                                 Surgeon:  DR. HUNG Surgeon's Group or Practice Name:  [facility] Phone number:  [phone] Fax number:  [phone]   Type of Clearance Requested:   - Medical  - Pharmacy:  Hold Apixaban (Eliquis)     Type of Anesthesia:   PROPOFOL   Additional requests/questions:    Elpidio Anis   01/20/2023, 10:51 AM

## 2023-01-20 NOTE — Telephone Encounter (Signed)
Pharmacy please advise on holding Eliquis prior to colonoscopy scheduled for 02/11/2023. Thank you.

## 2023-01-20 NOTE — Telephone Encounter (Signed)
   Name: Elaine Jackson  DOB: July 12, 1955  MRN: [id number]  Primary Cardiologist: None   Preoperative team, please contact this patient and set up a phone call appointment for further preoperative risk assessment. Please obtain consent and complete medication review. Thank you for your help.  I confirm that guidance regarding antiplatelet and oral anticoagulation therapy has been completed and, if necessary, noted below.  Per office protocol, patient can hold Eliquis for 1-2 days prior to procedure.     Napoleon Form, Leodis Rains, NP 01/20/2023, 3:25 PM [facility]

## 2023-01-21 NOTE — Telephone Encounter (Signed)
Left message to call back to set up tele pre op appt.  

## 2023-01-22 ENCOUNTER — Telehealth: Payer: Self-pay | Admitting: *Deleted

## 2023-01-22 NOTE — Telephone Encounter (Signed)
Pt has been scheduled for tele pre op appt 02/03/23 @ 2:20. Med rec and consent are done.

## 2023-01-22 NOTE — Telephone Encounter (Signed)
Pt has been scheduled for tele pre op appt 02/03/23 @ 2:20. Med rec and consent are done.     Patient Consent for Virtual Visit        Elaine Jackson has provided verbal consent on 01/22/2023 for a virtual visit (video or telephone).   CONSENT FOR VIRTUAL VISIT FOR:  Elaine Jackson  By participating in this virtual visit I agree to the following:  I hereby voluntarily request, consent and authorize [facility] and its employed or contracted physicians, physician assistants, nurse practitioners or other licensed health care professionals (the Practitioner), to provide me with telemedicine health care services (the "Services") as deemed necessary by the treating Practitioner. I acknowledge and consent to receive the Services by the Practitioner via telemedicine. I understand that the telemedicine visit will involve communicating with the Practitioner through live audiovisual communication technology and the disclosure of certain medical information by electronic transmission. I acknowledge that I have been given the opportunity to request an in-person assessment or other available alternative prior to the telemedicine visit and am voluntarily participating in the telemedicine visit.  I understand that I have the right to withhold or withdraw my consent to the use of telemedicine in the course of my care at any time, without affecting my right to future care or treatment, and that the Practitioner or I may terminate the telemedicine visit at any time. I understand that I have the right to inspect all information obtained and/or recorded in the course of the telemedicine visit and may receive copies of available information for a reasonable fee.  I understand that some of the potential risks of receiving the Services via telemedicine include:  Delay or interruption in medical evaluation due to technological equipment failure or disruption; Information transmitted may not be sufficient (e.g. poor  resolution of images) to allow for appropriate medical decision making by the Practitioner; and/or  In rare instances, security protocols could fail, causing a breach of personal health information.  Furthermore, I acknowledge that it is my responsibility to provide information about my medical history, conditions and care that is complete and accurate to the best of my ability. I acknowledge that Practitioner's advice, recommendations, and/or decision may be based on factors not within their control, such as incomplete or inaccurate data provided by me or distortions of diagnostic images or specimens that may result from electronic transmissions. I understand that the practice of medicine is not an exact science and that Practitioner makes no warranties or guarantees regarding treatment outcomes. I acknowledge that a copy of this consent can be made available to me via my patient portal [facility] MyChart), or I can request a printed copy by calling the office of [facility].    I understand that my insurance will be billed for this visit.   I have read or had this consent read to me. I understand the contents of this consent, which adequately explains the benefits and risks of the Services being provided via telemedicine.  I have been provided ample opportunity to ask questions regarding this consent and the Services and have had my questions answered to my satisfaction. I give my informed consent for the services to be provided through the use of telemedicine in my medical care

## 2023-02-03 ENCOUNTER — Ambulatory Visit: Payer: Medicare Other

## 2023-02-03 NOTE — Progress Notes (Deleted)
{Choose 1 Note Type (Telehealth Visit or Telephone Visit):[phone]}  Evaluation Performed:  Preoperative cardiovascular risk assessment _____________   Date:  02/03/2023   Patient ID:  Elaine Jackson, DOB 02/18/1956, MRN [id number] Patient Location:  Home Provider location:   Office  Primary Care Provider:  Ethelda Chick, MD Primary Cardiologist:  None  Chief Complaint / Patient Profile   67 y.o. y/o female with a h/o *** who is pending *** and presents today for telephonic preoperative cardiovascular risk assessment.  History of Present Illness    Elaine Jackson is a 67 y.o. female who presents via audio/video conferencing for a telehealth visit today.  Pt was last seen in cardiology clinic on *** by ***.  At that time Elaine Jackson was doing well ***.  The patient is now pending procedure as outlined above. Since her last visit, she ***  Past Medical History    Past Medical History:  Diagnosis Date   Allergic rhinitis, cause unspecified    Allergy    Basal cell carcinoma 10/04/1996   right chest-TX-DR.Turner   BCC (basal cell carcinoma) 12/05/2000   upper mid back TX DR.Jones   BCC (basal cell carcinoma) 12/05/2000   Left upper back TX Dr. Yetta Barre   [facility] (basal cell carcinoma) 09/16/2001   left upper back TX Dr.Jones   BCC (basal cell carcinoma) 08/15/2003   left upper mid back T Dr.Jones   BCC (basal cell carcinoma) 08/15/2003   right upper mid back Dr. Yetta Barre   [facility] (basal cell carcinoma) 08/15/2003   left medial breast TX Dr.jones   BCC (basal cell carcinoma) 10/12/2004   left anterior shin tx Dr.jones   BCC (basal cell carcinoma) 09/14/2007   right of sternum-Tx-CX21fu   BCC (basal cell carcinoma) 10/20/2012   left post sholder-tx CX35FU   BCC (basal cell carcinoma) 10/20/2012   left sholder tip-TX- CX35FU   BCC (basal cell carcinoma) 10/20/2012   left temple-TX CX35FU   BCC (basal cell carcinoma) 10/20/2012   left outer calf-TX cx38fu   BCC (basal cell carcinoma)  10/20/2012   left upper arm-tx cx12fu   BCC (basal cell carcinoma) 10/20/2012   right sholder tip-tx cx28fu   BCC (basal cell carcinoma) 11/10/2013   right upper back-cx64fu   BCC (basal cell carcinoma) 06/07/2014   left inferior post sholder-TXpbx   BCC (basal cell carcinoma) 06/13/2015   righ post sholder-TXpbx   BCC (basal cell carcinoma) 06/13/2015   left upper ankle   BCC (basal cell carcinoma) 03/18/2017   right outer back-txpbx   BCC (basal cell carcinoma) 03/18/2017   left trapezius-txpbx   BCC (basal cell carcinoma) 03/18/2017   left deltoid-txpbx   BCC (basal cell carcinoma) 06/05/2017   right rhomboid-txpbx   BCC (basal cell carcinoma) 04/28/2018   right tragus-txpbx   BCC (basal cell carcinoma) 06/09/2018   mid upper back txpbx   BCC (basal cell carcinoma) 03/09/2019   left deltoid inferior txpbx (exc 09/19/21)   BCC (basal cell carcinoma) 03/09/2019   left chest medial txpbx   CAD (coronary artery disease)    LHC 12/20/2014 showed small to moderate apical infarction due to distal LAD stenosis with reestablished TIMI2 flow. No PCI. Recommended plavix for 1 yr and ASA for life.      Chicken pox    Chronic bronchitis (HCC)    Family history of breast cancer in first degree relative    sister age 72   Heart murmur    Hyperlipidemia  Hypertension    Hypothyroidism    Malignant neoplasm skin of face 02/16/2007   Basal Cell Carcinoma  [place].   Measles    Melanoma (HCC) 12/17/2017   left forearm-MIS TX- Excision   Migraine <1982   "went away when I got pregnant" (09/25/2017)   Myocardial infarction (HCC) 01/2015   Obesity, unspecified    Other chronic nonalcoholic liver disease    Pre-diabetes    SCCA (squamous cell carcinoma) of skin 01/02/2021   Left Lower Leg - Anterior (in situ) tx cx3 65fu   SCCA (squamous cell carcinoma) of skin 01/02/2021   Left Submandibular Area (in situ) tx cx 3 39fu   Skin cancer 10/12/2004   right anterior shin-CIS-TX Dr.Jones    Skin cancer 03/10/2013   right lower shin-txpbx CIS   Skin cancer 01/18/2015   left neck-KA cx1 + Cautery   Skin cancer 02/28/2015   left neck-excision   Skin cancer 06/05/2017   right scapula-CIS   Skin cancer 04/28/2018   left ear helix-well diff scc-txpbx   Sleep related leg cramps    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; denies residual on 09/25/2017   Thyroid nodule    10-2016    Unspecified vitamin D deficiency    Vision abnormalities    Past Surgical History:  Procedure Laterality Date   ATRIAL FIBRILLATION ABLATION  09/25/2017   ATRIAL FIBRILLATION ABLATION N/A 09/25/2017   Procedure: ATRIAL FIBRILLATION ABLATION;  Surgeon: Hillis Range, MD;  Location: [facility] INVASIVE CV LAB;  Service: Cardiovascular;  Laterality: N/A;   CARDIAC CATHETERIZATION N/A 12/20/2014   Procedure: Left Heart Cath and Coronary Angiography;  Surgeon: Thurmon Fair, MD;  Location: [facility] INVASIVE CV LAB;  Service: Cardiovascular;  Laterality: N/A;   COLONOSCOPY  06/25/2007   [place]. Normal. Repeat in ten years.   CYSTOSTOMY W/ BLADDER DILATION  1960s   "bladder stem stretched"   LAPAROSCOPIC CHOLECYSTECTOMY     LAPAROSCOPIC GASTRIC BANDING  07/03/10   Dr. Adriana Mccallum; [place]   LOOP RECORDER INSERTION N/A 08/23/2016   Procedure: Loop Recorder Insertion;  Surgeon: Duke Salvia, MD;  Location: [facility] INVASIVE CV LAB;  Service: Cardiovascular;  Laterality: N/A;   SQUAMOUS CELL CARCINOMA EXCISION Left    neck   THYROIDECTOMY N/A 12/17/2016   Procedure: TOTAL THYROIDECTOMY;  Surgeon: Glenna Fellows, MD;  Location: [facility] ORS;  Service: General;  Laterality: N/A;   TONSILLECTOMY      Allergies  Allergies  Allergen Reactions   Sulfa Antibiotics Nausea And Vomiting and Other (See Comments)   Zofran [Ondansetron Hcl] Other (See Comments)    Oral numbness with IV dosing.     Home Medications    Prior to Admission medications   Medication Sig Start Date End Date Taking? Authorizing Provider   apixaban (ELIQUIS) 5 MG TABS tablet Take 1 tablet (5 mg total) by mouth 2 (two) times daily. 08/28/22   Jake Bathe, MD  apixaban (ELIQUIS) 5 MG TABS tablet Take 1 tablet (5 mg total) by mouth 2 (two) times daily. 09/13/22   Jake Bathe, MD  atorvastatin (LIPITOR) 80 MG tablet Take 80 mg by mouth daily.    [provider]  azelastine (ASTELIN) 0.1 % nasal spray Place 2 sprays into both nostrils as needed for rhinitis. Use in each nostril as directed    [provider]  B COMPLEX-C-FOLIC ACID PO Take 1 tablet by mouth daily.    [provider]  cholecalciferol (VITAMIN D) 400  units TABS tablet Take 2,000 Units by mouth daily.     [provider]  CONTOUR NEXT TEST test strip AS DIRECTED 01/09/18   Ethelda Chick, MD  diltiazem (CARDIZEM CD) 240 MG 24 hr capsule Take 1 capsule (240 mg total) by mouth daily. 10/21/22 10/16/23  Jake Bathe, MD  Fluorouracil (TOLAK) 4 % CREA Apply 1 Application topically at bedtime. 01/02/22   Sheffield, Judye Bos, PA-C  fluticasone (FLONASE) 50 MCG/ACT nasal spray Place 2 sprays into both nostrils daily as needed for allergies. 06/09/17   Ethelda Chick, MD  furosemide (LASIX) 20 MG tablet Take 20 mg by mouth as needed.    [provider]  levothyroxine (SYNTHROID) 112 MCG tablet Take 112 mcg by mouth daily. 06/19/20   [provider]  montelukast (SINGULAIR) 10 MG tablet Take 10 mg by mouth daily.    [provider]  Multiple Vitamins-Minerals (MULTIVITAMIN ADULT EXTRA C PO) Multivitamin    [provider]  omeprazole (PRILOSEC) 40 MG capsule Take 40 mg by mouth daily. 11/14/21   [provider]  potassium chloride SA (K-DUR,KLOR-CON) 20 MEQ tablet Take 20 mEq by mouth as needed. Take with lasix.    [provider]  valsartan (DIOVAN) 80 MG tablet Take 1 tablet (80 mg total) by mouth daily. 10/22/22   Jake Bathe, MD    Physical Exam    Vital Signs:  Elaine Jackson does not  have vital signs available for review today.***  Given telephonic nature of communication, physical exam is limited. AAOx3. NAD. Normal affect.  Speech and respirations are unlabored.  Accessory Clinical Findings    None  Assessment & Plan    1.  Preoperative Cardiovascular Risk Assessment:  The patient was advised that if she develops new symptoms prior to surgery to contact our office to arrange for a follow-up visit, and she verbalized understanding.   A copy of this note will be routed to requesting surgeon.  Time:   Today, I have spent *** minutes with the patient with telehealth technology discussing medical history, symptoms, and management plan.     Sharlene Dory, PA-C  02/03/2023, 2:22 PM

## 2023-02-03 NOTE — Progress Notes (Signed)
PT HAS RESCHEDULED TELE PRE OP APPT TO 02/07/23 @ 10:40. OK PER TESSA CONTE, PAC TO USE OVERBOOK PROVIDER SLOT.

## 2023-02-04 NOTE — Telephone Encounter (Signed)
PT HAS RESCHEDULED TELE PRE OP APPT TO 02/07/23 @ 10:40. OK PER TESSA CONTE, PAC TO USE OVERBOOK PROVIDER SLOT.

## 2023-02-07 ENCOUNTER — Ambulatory Visit: Payer: Medicare Other | Attending: Cardiology

## 2023-02-07 DIAGNOSIS — Z01810 Encounter for preprocedural cardiovascular examination: Secondary | ICD-10-CM

## 2023-02-07 NOTE — Progress Notes (Signed)
Virtual Visit via Telephone Note   Because of Elaine Jackson's co-morbid illnesses, she is at least at moderate risk for complications without adequate follow up.  This format is felt to be most appropriate for this patient at this time.  The patient did not have access to video technology/had technical difficulties with video requiring transitioning to audio format only (telephone).  All issues noted in this document were discussed and addressed.  No physical exam could be performed with this format.  Please refer to the patient's chart for her consent to telehealth for [facility].  Evaluation Performed:  Preoperative cardiovascular risk assessment _____________   Date:  02/07/2023   Patient ID:  Elaine Jackson, DOB 22-Jan-1956, MRN [id number] Patient Location:  Home Provider location:   Office  Primary Care Provider:  Ethelda Chick, MD Primary Cardiologist:  None  Chief Complaint / Patient Profile   67 y.o. y/o female with a h/o paroxysmal atrial fibrillation, palpitations, coronary artery disease who is pending colonoscopy and presents today for telephonic preoperative cardiovascular risk assessment.  History of Present Illness    Elaine Jackson is a 67 y.o. female who presents via audio/video conferencing for a telehealth visit today.  Pt was last seen in cardiology clinic on 03/26/2022 by Dr. Anne Jackson.  At that time Elaine Jackson was doing well .  The patient is now pending procedure as outlined above. Since her last visit, she remains stable from a cardiac standpoint.  Today she denies chest pain, shortness of breath, lower extremity edema, fatigue, palpitations, melena, hematuria, hemoptysis, diaphoresis, weakness, presyncope, syncope, orthopnea, and PND.   Past Medical History    Past Medical History:  Diagnosis Date   Allergic rhinitis, cause unspecified    Allergy    Basal cell carcinoma 10/04/1996   right chest-TX-DR.Turner   BCC (basal cell carcinoma) 12/05/2000    upper mid back TX DR.Jones   BCC (basal cell carcinoma) 12/05/2000   Left upper back TX Dr. Yetta Barre   [facility] (basal cell carcinoma) 09/16/2001   left upper back TX Dr.Jones   BCC (basal cell carcinoma) 08/15/2003   left upper mid back T Dr.Jones   BCC (basal cell carcinoma) 08/15/2003   right upper mid back Dr. Yetta Barre   [facility] (basal cell carcinoma) 08/15/2003   left medial breast TX Dr.jones   BCC (basal cell carcinoma) 10/12/2004   left anterior shin tx Dr.jones   BCC (basal cell carcinoma) 09/14/2007   right of sternum-Tx-CX60fu   BCC (basal cell carcinoma) 10/20/2012   left post sholder-tx CX35FU   BCC (basal cell carcinoma) 10/20/2012   left sholder tip-TX- CX35FU   BCC (basal cell carcinoma) 10/20/2012   left temple-TX CX35FU   BCC (basal cell carcinoma) 10/20/2012   left outer calf-TX cx33fu   BCC (basal cell carcinoma) 10/20/2012   left upper arm-tx cx64fu   BCC (basal cell carcinoma) 10/20/2012   right sholder tip-tx cx36fu   BCC (basal cell carcinoma) 11/10/2013   right upper back-cx43fu   BCC (basal cell carcinoma) 06/07/2014   left inferior post sholder-TXpbx   BCC (basal cell carcinoma) 06/13/2015   righ post sholder-TXpbx   BCC (basal cell carcinoma) 06/13/2015   left upper ankle   BCC (basal cell carcinoma) 03/18/2017   right outer back-txpbx   BCC (basal cell carcinoma) 03/18/2017   left trapezius-txpbx   BCC (basal cell carcinoma) 03/18/2017   left deltoid-txpbx   BCC (basal cell carcinoma) 06/05/2017   right rhomboid-txpbx  BCC (basal cell carcinoma) 04/28/2018   right tragus-txpbx   BCC (basal cell carcinoma) 06/09/2018   mid upper back txpbx   BCC (basal cell carcinoma) 03/09/2019   left deltoid inferior txpbx (exc 09/19/21)   BCC (basal cell carcinoma) 03/09/2019   left chest medial txpbx   CAD (coronary artery disease)    LHC 12/20/2014 showed small to moderate apical infarction due to distal LAD stenosis with reestablished TIMI2 flow. No PCI.  Recommended plavix for 1 yr and ASA for life.      Chicken pox    Chronic bronchitis (HCC)    Family history of breast cancer in first degree relative    sister age 54   Heart murmur    Hyperlipidemia    Hypertension    Hypothyroidism    Malignant neoplasm skin of face 02/16/2007   Basal Cell Carcinoma  [place].   Measles    Melanoma (HCC) 12/17/2017   left forearm-MIS TX- Excision   Migraine <1982   "went away when I got pregnant" (09/25/2017)   Myocardial infarction (HCC) 01/2015   Obesity, unspecified    Other chronic nonalcoholic liver disease    Pre-diabetes    SCCA (squamous cell carcinoma) of skin 01/02/2021   Left Lower Leg - Anterior (in situ) tx cx3 20fu   SCCA (squamous cell carcinoma) of skin 01/02/2021   Left Submandibular Area (in situ) tx cx 3 63fu   Skin cancer 10/12/2004   right anterior shin-CIS-TX Dr.Jones   Skin cancer 03/10/2013   right lower shin-txpbx CIS   Skin cancer 01/18/2015   left neck-KA cx1 + Cautery   Skin cancer 02/28/2015   left neck-excision   Skin cancer 06/05/2017   right scapula-CIS   Skin cancer 04/28/2018   left ear helix-well diff scc-txpbx   Sleep related leg cramps    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; denies residual on 09/25/2017   Thyroid nodule    10-2016    Unspecified vitamin D deficiency    Vision abnormalities    Past Surgical History:  Procedure Laterality Date   ATRIAL FIBRILLATION ABLATION  09/25/2017   ATRIAL FIBRILLATION ABLATION N/A 09/25/2017   Procedure: ATRIAL FIBRILLATION ABLATION;  Surgeon: Hillis Range, MD;  Location: [facility] INVASIVE CV LAB;  Service: Cardiovascular;  Laterality: N/A;   CARDIAC CATHETERIZATION N/A 12/20/2014   Procedure: Left Heart Cath and Coronary Angiography;  Surgeon: Thurmon Fair, MD;  Location: [facility] INVASIVE CV LAB;  Service: Cardiovascular;  Laterality: N/A;   COLONOSCOPY  06/25/2007   [place]. Normal. Repeat in ten years.   CYSTOSTOMY W/ BLADDER DILATION  1960s   "bladder  stem stretched"   LAPAROSCOPIC CHOLECYSTECTOMY     LAPAROSCOPIC GASTRIC BANDING  07/03/10   Dr. Adriana Mccallum; [place]   LOOP RECORDER INSERTION N/A 08/23/2016   Procedure: Loop Recorder Insertion;  Surgeon: Duke Salvia, MD;  Location: [facility] INVASIVE CV LAB;  Service: Cardiovascular;  Laterality: N/A;   SQUAMOUS CELL CARCINOMA EXCISION Left    neck   THYROIDECTOMY N/A 12/17/2016   Procedure: TOTAL THYROIDECTOMY;  Surgeon: Glenna Fellows, MD;  Location: [facility] ORS;  Service: General;  Laterality: N/A;   TONSILLECTOMY      Allergies  Allergies  Allergen Reactions   Sulfa Antibiotics Nausea And Vomiting and Other (See Comments)   Zofran [Ondansetron Hcl] Other (See Comments)    Oral numbness with IV dosing.     Home Medications    Prior to Admission medications   Medication  Sig Start Date End Date Taking? Authorizing Provider  apixaban (ELIQUIS) 5 MG TABS tablet Take 1 tablet (5 mg total) by mouth 2 (two) times daily. 08/28/22   Jake Bathe, MD  apixaban (ELIQUIS) 5 MG TABS tablet Take 1 tablet (5 mg total) by mouth 2 (two) times daily. 09/13/22   Jake Bathe, MD  atorvastatin (LIPITOR) 80 MG tablet Take 80 mg by mouth daily.    [provider]  azelastine (ASTELIN) 0.1 % nasal spray Place 2 sprays into both nostrils as needed for rhinitis. Use in each nostril as directed    [provider]  B COMPLEX-C-FOLIC ACID PO Take 1 tablet by mouth daily.    [provider]  cholecalciferol (VITAMIN D) 400 units TABS tablet Take 2,000 Units by mouth daily.     [provider]  CONTOUR NEXT TEST test strip AS DIRECTED 01/09/18   Elaine Chick, MD  diltiazem (CARDIZEM CD) 240 MG 24 hr capsule Take 1 capsule (240 mg total) by mouth daily. 10/21/22 10/16/23  Jake Bathe, MD  Fluorouracil (TOLAK) 4 % CREA Apply 1 Application topically at bedtime. 01/02/22   Sheffield, Judye Bos, PA-C  fluticasone (FLONASE) 50 MCG/ACT nasal spray Place 2 sprays into both nostrils  daily as needed for allergies. 06/09/17   Elaine Chick, MD  furosemide (LASIX) 20 MG tablet Take 20 mg by mouth as needed.    [provider]  levothyroxine (SYNTHROID) 112 MCG tablet Take 112 mcg by mouth daily. 06/19/20   [provider]  montelukast (SINGULAIR) 10 MG tablet Take 10 mg by mouth daily.    [provider]  Multiple Vitamins-Minerals (MULTIVITAMIN ADULT EXTRA C PO) Multivitamin    [provider]  omeprazole (PRILOSEC) 40 MG capsule Take 40 mg by mouth daily. 11/14/21   [provider]  potassium chloride SA (K-DUR,KLOR-CON) 20 MEQ tablet Take 20 mEq by mouth as needed. Take with lasix.    [provider]  valsartan (DIOVAN) 80 MG tablet Take 1 tablet (80 mg total) by mouth daily. 10/22/22   Jake Bathe, MD    Physical Exam    Vital Signs:  ALLYSUN TRINDLE does not have vital signs available for review today.  Given telephonic nature of communication, physical exam is limited. AAOx3. NAD. Normal affect.  Speech and respirations are unlabored.  Accessory Clinical Findings    None  Assessment & Plan    1.  Preoperative Cardiovascular Risk Assessment: Colonoscopy, Dr. Audley Hose, [facility], [phone]      Primary Cardiologist: None  Chart reviewed as part of pre-operative protocol coverage. Given past medical history and time since last visit, based on ACC/AHA guidelines, REISS AMEDEO would be at acceptable risk for the planned procedure without further cardiovascular testing.   Patient was advised that if she develops new symptoms prior to surgery to contact our office to arrange a follow-up appointment.  She verbalized understanding.  Patient with diagnosis of afib on Eliquis for anticoagulation.     Procedure: colonoscopy Date of procedure: 02/11/23     CHA2DS2-VASc Score = 7   This indicates a 11.2% annual risk of stroke. The patient's score is based upon: CHF History: 0 HTN History:  1 Diabetes History: 1 Stroke History: 2 Vascular Disease History: 1 Age Score: 1 Gender Score: 1       CrCl 52 ml/min   Per office protocol, patient can hold Eliquis for 1-2 days prior to procedure.  I will route this recommendation to the requesting party via Epic fax function and remove from pre-op pool.       Time:   Today, I have spent 5 minutes with the patient with telehealth technology discussing medical history, symptoms, and management plan.  Prior to her phone evaluation I spent greater than 10 minutes reviewing her past medical history and cardiac medications.   Ronney Asters, NP  02/07/2023, 6:56 AM

## 2023-02-20 ENCOUNTER — Other Ambulatory Visit: Payer: Self-pay | Admitting: Family Medicine

## 2023-02-20 DIAGNOSIS — N6489 Other specified disorders of breast: Secondary | ICD-10-CM

## 2023-04-07 ENCOUNTER — Ambulatory Visit: Payer: Medicare Other | Admitting: Cardiology

## 2023-04-08 ENCOUNTER — Other Ambulatory Visit: Payer: Self-pay | Admitting: Family Medicine

## 2023-04-08 ENCOUNTER — Ambulatory Visit
Admission: RE | Admit: 2023-04-08 | Discharge: 2023-04-08 | Disposition: A | Discharge status: Home / Self Care | Payer: Medicare Other | Source: Ambulatory Visit | Attending: Family Medicine | Admitting: Family Medicine

## 2023-04-08 DIAGNOSIS — N6489 Other specified disorders of breast: Secondary | ICD-10-CM

## 2023-04-11 ENCOUNTER — Other Ambulatory Visit: Payer: Self-pay | Admitting: Cardiology

## 2023-04-14 ENCOUNTER — Ambulatory Visit
Admission: RE | Admit: 2023-04-14 | Discharge: 2023-04-14 | Disposition: A | Discharge status: Home / Self Care | Payer: Medicare Other | Source: Ambulatory Visit | Attending: Family Medicine | Admitting: Family Medicine

## 2023-04-14 DIAGNOSIS — N6489 Other specified disorders of breast: Secondary | ICD-10-CM

## 2023-04-14 HISTORY — PX: BREAST BIOPSY: SHX20

## 2023-04-15 LAB — SURGICAL PATHOLOGY

## 2023-04-27 NOTE — Progress Notes (Unsigned)
Cardiology Office Note    Date:  04/28/2023  ID:  [name], DOB November 28, 1955, MRN [id number] PCP:  Ethelda Chick, MD  Cardiologist:  Donato Schultz, MD  Electrophysiologist:  None   Chief Complaint: Follow up for PAF and CAD  History of Present Illness: Elaine Jackson is a 67 y.o. female with visit-pertinent history of paroxysmal atrial fibrillation post ablation in April 2019, coronary artery disease s/p LHC in 2016 showing small distal LAD high grade stenosis, and prior stroke.  In 2016 [name] underwent left heart catheterization in setting of NSTEMI.  Found to have single-vessel CAD with small to moderate apical infarction due to a small distal LAD stenosis with reestablished (TIMI 2) flow.  Medical therapy with statin and lifelong ASA was recommended.   She was previously followed by Dr. Johney Frame and the A-fib clinic for paroxysmal atrial fibrillation.  She underwent ablation in April 2019.  She has had occasional A-fib, had been well-maintained on Eliquis and since starting on Cardizem in early 2023.  Of note her symptoms of A-fib consist of an abnormal dry sensation in her throat and auris.  She was last seen by Dr. Anne Fu on 03/26/2022.  She had remained stable from a cardiac perspective and was unaware of any recurrence of A-fib.  Today she presents for follow up. She reports that she has been doing very well. She believes that she has been having episodes of atrial fibrillation, notes the episodes generally last less than hour, her only symptom is a dry sensation in the back of her throat. She will check her heart rate on her blood pressure cuff, notes it is usually between 80-100 bpm when she feels she is in afib. Her normal heart rate is between 55-65 bpm. She denies chest pain, palpitations, lower extremity edema or shortness of breath.  Today her EKG shows that she is in atrial fibrillation at 100 bpm, on recheck of heart rate she was down to 88 bpm.  She is overall cardiac  unaware aside from a dry sensation in the back of her throat which she associates with atrial fibrillation.  Aside from A-fib her only other complaint is an occasional sense of being off balance or slightly dizzy that last less than 5 seconds, not associated with position changes.  Her PCP feels this is likely related to vertigo as she notes she also has a sensation of having fullness in her sinuses.  She notes that she is not extremely active, she does not intentionally exercise.  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  Labwork independently reviewed: 04/23/2023: Hemoglobin 12.4, hematocrit 39.2, sodium 139, potassium 4.4, creatinine 1.1, AST 23, ALT 24, TSH 1.1   ROS: .   Today she denies chest pain, shortness of breath, lower extremity edema, fatigue, palpitations, melena, hematuria, hemoptysis, diaphoresis, weakness, presyncope, syncope, orthopnea, and PND.  All other systems are reviewed and otherwise negative. Studies Reviewed: Marland Kitchen    EKG:  EKG is ordered today, personally reviewed, demonstrating  EKG Interpretation Date/Time:  Monday April 28 2023 15:18:27 EST Ventricular Rate:  100 PR Interval:    QRS Duration:  80 QT Interval:  356 QTC Calculation: 459 R Axis:   35  Text Interpretation: Atrial fibrillation with premature ventricular or aberrantly conducted complexes Confirmed by Reather Littler [phone] on 04/28/2023 3:37:33  PM    CV Studies:  Cardiac Studies & Procedures   CARDIAC CATHETERIZATION  CARDIAC CATHETERIZATION 12/20/2014  Narrative  Dist LAD lesion, 90% stenosed.  Apical infarction with preserved overall LVEF  IMPRESSIONS: Single vessel CAD. Small to moderate apical infarction due to a distal LAD stenosis with reestablished (TIMI2) flow  RECOMMENDATION: Medical therapy with statin, lifelong ASA, dual antiplatelet therapy for 12  months. Prefer ARB/ACEi for BP control. Beta blockers would be helpful, but precluded by baseline bradycardia. Aggressive control of risk factors, especially DM.  Findings Coronary Findings Diagnostic  Dominance: Co-dominant  Left Anterior Descending thrombotic ulcerative .  Intervention  No interventions have been documented.     ECHOCARDIOGRAM  ECHOCARDIOGRAM COMPLETE 10/12/2021  Narrative ECHOCARDIOGRAM REPORT    Patient Name:   Elaine Jackson Date of Exam: 10/12/2021 Medical Rec #:  [id number]    Height:       62.5 in Accession #:    [id number]   Weight:       197.2 lb Date of Birth:  08/24/1955    BSA:          1.912 m Patient Age:    65 years     BP:           100/60 mmHg Patient Gender: F            HR:           61 bpm. Exam Location:  Outpatient  Procedure: 2D Echo, Color Doppler, Cardiac Doppler and Strain Analysis  Indications:    R06.9 DOE; R60.0 Lower extremity edema  History:        Patient has prior history of Echocardiogram examinations, most recent 08/22/2016. Previous Myocardial Infarction and CAD, Atrial ablation 09/25/2017, Stroke, Arrythmias:Atrial Fibrillation and PVC, Signs/Symptoms:Dyspnea and Edema; Risk Factors:Dyslipidemia, Former Smoker, Hypertension and Diabetes. Patient denies chest pain. She does have DOE and leg edema. Currently has a productive cough. Patient contracted CoVid-19 12/2020.  Sonographer:    Carlos American RVT, RDCS (AE), RDMS Referring Phys: [id number] JAMES ALLRED   Sonographer Comments: Image acquisition challenging due to respiratory motion. IMPRESSIONS   1. Left ventricular ejection fraction, by estimation, is 65 to 70%. Left ventricular ejection fraction by 3D volume is 73 %. The left ventricle has normal function. The left ventricle has no regional wall motion abnormalities. Left ventricular diastolic parameters are consistent with Grade II diastolic dysfunction (pseudonormalization). 2. Right ventricular systolic function  is normal. The right ventricular size is mildly enlarged. There is mildly elevated pulmonary artery systolic pressure. 3. Left atrial size was severely dilated. 4. The mitral valve is grossly normal. No evidence of mitral valve regurgitation. The mean mitral valve gradient is 1.9 mmHg with average heart rate of 67 bpm. 5. The aortic valve is tricuspid. Aortic valve regurgitation is mild. No aortic stenosis is present. 6. The inferior vena cava is dilated in size with >50% respiratory variability, suggesting right atrial pressure of 8 mmHg.  Comparison(s): No significant change from prior study. EF 60%.  FINDINGS Left Ventricle: Left ventricular ejection fraction, by estimation, is 65 to 70%. Left ventricular ejection fraction by 3D volume is 73 %. The left ventricle has normal function. The left ventricle has no regional wall motion abnormalities. The left ventricular internal cavity size was normal in size. There is no left ventricular hypertrophy. Left ventricular diastolic parameters are consistent with Grade II diastolic dysfunction (pseudonormalization).  Right Ventricle: The right ventricular size is mildly enlarged. No increase in right ventricular wall thickness.  Right ventricular systolic function is normal. There is mildly elevated pulmonary artery systolic pressure. The tricuspid regurgitant velocity is 2.65 m/s, and with an assumed right atrial pressure of 8 mmHg, the estimated right ventricular systolic pressure is 36.1 mmHg.  Left Atrium: Left atrial size was severely dilated.  Right Atrium: Right atrial size was normal in size.  Pericardium: There is no evidence of pericardial effusion.  Mitral Valve: The mitral valve is grossly normal. No evidence of mitral valve regurgitation. The mean mitral valve gradient is 1.9 mmHg with average heart rate of 67 bpm.  Tricuspid Valve: The tricuspid valve is normal in structure. Tricuspid valve regurgitation is mild . No evidence of  tricuspid stenosis.  Aortic Valve: The aortic valve is tricuspid. Aortic valve regurgitation is mild. Aortic regurgitation PHT measures 643 msec. No aortic stenosis is present. Aortic valve mean gradient measures 4.0 mmHg. Aortic valve peak gradient measures 8.8 mmHg. Aortic valve area, by VTI measures 2.31 cm.  Pulmonic Valve: The pulmonic valve was not well visualized. Pulmonic valve regurgitation is not visualized.  Aorta: The aortic root and ascending aorta are structurally normal, with no evidence of dilitation.  Venous: The inferior vena cava is dilated in size with greater than 50% respiratory variability, suggesting right atrial pressure of 8 mmHg.  IAS/Shunts: No atrial level shunt detected by color flow Doppler.   LEFT VENTRICLE PLAX 2D LVIDd:         4.74 cm         Diastology LVIDs:         2.83 cm         LV e' medial:    6.32 cm/s LV PW:         1.18 cm         LV E/e' medial:  17.1 LV IVS:        1.03 cm         LV e' lateral:   8.52 cm/s LVOT diam:     1.90 cm         LV E/e' lateral: 12.7 LV SV:         78 LV SV Index:   41              2D LVOT Area:     2.84 cm        Longitudinal Strain 2D Strain GLS  -22.0 % LV Volumes (MOD)               Avg: LV vol d, MOD    70.8 ml A2C:                           3D Volume EF LV vol d, MOD    77.0 ml       LV 3D EF:    Left A4C:                                        ventricul LV vol s, MOD    20.7 ml                    ar A2C:                                        ejection  LV vol s, MOD    22.5 ml                    fraction A4C:                                        by 3D LV SV MOD A2C:   50.1 ml                    volume is LV SV MOD A4C:   77.0 ml                    73 %. LV SV MOD BP:    53.0 ml  3D Volume EF: 3D EF:        73 % LV EDV:       101 ml LV ESV:       27 ml LV SV:        73 ml  RIGHT VENTRICLE RV S prime:     13.60 cm/s TAPSE (M-mode): 3.5 cm  LEFT ATRIUM              Index        RIGHT  ATRIUM           Index LA diam:        4.40 cm  2.30 cm/m   RA Area:     20.80 cm LA Vol (A2C):   92.0 ml  48.13 ml/m  RA Volume:   61.80 ml  32.33 ml/m LA Vol (A4C):   106.0 ml 55.45 ml/m LA Biplane Vol: 98.6 ml  51.58 ml/m AORTIC VALVE                    PULMONIC VALVE AV Area (Vmax):    2.30 cm     PV Vmax:       1.10 m/s AV Area (Vmean):   2.35 cm     PV Peak grad:  4.8 mmHg AV Area (VTI):     2.31 cm AV Vmax:           148.00 cm/s AV Vmean:          93.800 cm/s AV VTI:            0.337 m AV Peak Grad:      8.8 mmHg AV Mean Grad:      4.0 mmHg LVOT Vmax:         120.00 cm/s LVOT Vmean:        77.900 cm/s LVOT VTI:          0.274 m LVOT/AV VTI ratio: 0.81 AI PHT:            643 msec AR Vena Contracta: 0.38 cm  AORTA Ao Root diam: 3.40 cm Ao Asc diam:  3.70 cm Ao Arch diam: 3.6 cm  MITRAL VALVE                TRICUSPID VALVE MV Area (PHT): 3.74 cm     TR Peak grad:   28.1 mmHg MV Mean grad:  1.9 mmHg     TR Vmax:        265.00 cm/s MV Decel Time: 203 msec MV E velocity: 108.00 cm/s  SHUNTS MV A velocity: 82.30 cm/s   Systemic VTI:  0.27 m MV E/A ratio:  1.31  Systemic Diam: 1.90 cm  Riley Lam MD Electronically signed by Riley Lam MD Signature Date/Time: 10/12/2021/5:22:36 PM    Final    MONITORS  LONG TERM MONITOR-LIVE TELEMETRY (3-14 DAYS) 08/28/2021  Narrative Patch Wear Time:  13 days and 6 hours  Predominant rhythm was sinus rhythm 3.7% supraventricular ectopy 3.2% ventricular ectopy No atrial fibrillation noted Triggered episodes associated with sinus rhythm and occasional PVCs  Will Camnitz, MD           Current Reported Medications:.    Current Meds  Medication Sig   apixaban (ELIQUIS) 5 MG TABS tablet Take 1 tablet (5 mg total) by mouth 2 (two) times daily.   atorvastatin (LIPITOR) 80 MG tablet Take 80 mg by mouth daily.   azelastine (ASTELIN) 0.1 % nasal spray Place 2 sprays into both nostrils as needed for  rhinitis. Use in each nostril as directed   B COMPLEX-C-FOLIC ACID PO Take 1 tablet by mouth daily.   cholecalciferol (VITAMIN D) 400 units TABS tablet Take 2,000 Units by mouth daily.    CONTOUR NEXT TEST test strip AS DIRECTED   diltiazem (CARDIZEM CD) 240 MG 24 hr capsule Take 1 capsule (240 mg total) by mouth daily.   Fluorouracil (TOLAK) 4 % CREA Apply 1 Application topically at bedtime.   fluticasone (FLONASE) 50 MCG/ACT nasal spray Place 2 sprays into both nostrils daily as needed for allergies.   furosemide (LASIX) 20 MG tablet Take 20 mg by mouth as needed.   levothyroxine (SYNTHROID) 112 MCG tablet Take 112 mcg by mouth daily.   montelukast (SINGULAIR) 10 MG tablet Take 10 mg by mouth daily.   Multiple Vitamins-Minerals (MULTIVITAMIN ADULT EXTRA C PO) Multivitamin   omeprazole (PRILOSEC) 40 MG capsule Take 40 mg by mouth daily.   potassium chloride SA (K-DUR,KLOR-CON) 20 MEQ tablet Take 20 mEq by mouth as needed. Take with lasix.   Semaglutide,0.25 or 0.5MG /DOS, 2 MG/3ML SOPN Inject into the skin.   valsartan (DIOVAN) 80 MG tablet TAKE 1 TABLET BY MOUTH EVERY DAY   Physical Exam:    VS:  BP 122/78   Pulse 88   Ht 5\' 2"  (1.575 m)   Wt 194 lb 8 oz (88.2 kg)   SpO2 99%   BMI 35.57 kg/m    Wt Readings from Last 3 Encounters:  04/28/23 194 lb 8 oz (88.2 kg)  03/26/22 198 lb (89.8 kg)  11/06/21 198 lb 9.6 oz (90.1 kg)    GEN: Well nourished, well developed in no acute distress NECK: No JVD; No carotid bruits CARDIAC: irregular RR, no murmurs, rubs, gallops RESPIRATORY:  Clear to auscultation without rales, wheezing or rhonchi  ABDOMEN: Soft, non-tender, non-distended EXTREMITIES:  No edema; No acute deformity  Asessement and Plan:.    Paroxysmal atrial fibrillation: s/p ablation in 2019 with Dr. Johney Frame. Prior symptoms include aura and feeling a dry sensation in her throat.  Today patient notes intermittent episodes when she feels that dry sensation, she will check her heart  rate and it is generally 80 to 100 bpm, these episodes generally last less than an hour. EKG today indicates atrial fibrillation at 100 bpm. Heart rate recheck showed 88 bpm. She reports compliance with her Eliquis, she denies any bleeding problems. CHA2DS2-VASc Score = 7 [CHF History: 0, HTN History: 1, Diabetes History: 1, Stroke History: 2, Vascular Disease History: 1, Age Score: 1, Gender Score: 1].  Therefore, the patient's annual risk of stroke is 11.2 %. Continue Eliquis 5 mg twice daily. Continue  Cardizem 240 mg daily with 30 mg as needed. Will refer back to afib clinic.   CAD: Patient had NSTEMI in 2016, noted to have small distal LAD with stenosis, no PCI previously performed. Stable with no anginal symptoms. No indication for ischemic evaluation.  Heart healthy diet and regular cardiovascular exercise encouraged.  No ASA given need for Eliquis. Continue Lipitor 80 mg daily, Cardizem 240 mg daily and valsartan 80 mg daily.  Dizziness/Vertigo: Patient notes occasional episodes of feeling slightly dizzy or off balance, lasting less than 5 seconds.  She denies feeling as though it is associated with position changes, denies presyncope or syncope. She also notes a sensation of feeling increased pressure in her sinuses. Seen by PCP last week who felt that this is likely episodes of vertigo. Her PCP has given her exercises for vertigo.  She will continue to monitor and if worsens she will let office know.  History of CVA in 2018: No residual symptoms. Denies recurrence. On Eliquis and atorvastatin.   Hypertension: Blood pressure today 122/78. Continue valsartan 80 mg daily.  Hyperlipidemia: Last lipid profile on 10/08/2022 indicated total cholesterol 142, triglyceride 92, HDL 54 and LDL 70.  On atorvastatin 80 mg daily.  Disposition: F/u with afib clinic at next available appointment. Follow up with Dr. Anne Fu in 4-5 months (per patient request).   Signed, Rip Harbour, NP

## 2023-04-28 ENCOUNTER — Encounter: Payer: Self-pay | Admitting: Cardiology

## 2023-04-28 ENCOUNTER — Ambulatory Visit: Payer: Medicare Other | Attending: Cardiology | Admitting: Cardiology

## 2023-04-28 VITALS — BP 122/78 | HR 88 | Ht 62.0 in | Wt 194.5 lb

## 2023-04-28 DIAGNOSIS — I639 Cerebral infarction, unspecified: Secondary | ICD-10-CM | POA: Diagnosis present

## 2023-04-28 DIAGNOSIS — I2510 Atherosclerotic heart disease of native coronary artery without angina pectoris: Secondary | ICD-10-CM | POA: Insufficient documentation

## 2023-04-28 DIAGNOSIS — I10 Essential (primary) hypertension: Secondary | ICD-10-CM | POA: Diagnosis present

## 2023-04-28 DIAGNOSIS — D6869 Other thrombophilia: Secondary | ICD-10-CM | POA: Insufficient documentation

## 2023-04-28 DIAGNOSIS — R002 Palpitations: Secondary | ICD-10-CM | POA: Diagnosis present

## 2023-04-28 DIAGNOSIS — R42 Dizziness and giddiness: Secondary | ICD-10-CM | POA: Diagnosis present

## 2023-04-28 DIAGNOSIS — I480 Paroxysmal atrial fibrillation: Secondary | ICD-10-CM | POA: Diagnosis not present

## 2023-04-28 DIAGNOSIS — E785 Hyperlipidemia, unspecified: Secondary | ICD-10-CM | POA: Diagnosis present

## 2023-04-28 NOTE — Patient Instructions (Addendum)
Medication Instructions:  No changes *If you need a refill on your cardiac medications before your next appointment, please call your pharmacy*  Lab Work: No labs  Testing/Procedures: No testing  Follow-Up: At [facility], you and your health needs are our priority.  As part of our continuing mission to provide you with exceptional heart care, we have created designated Provider Care Teams.  These Care Teams include your primary Cardiologist (physician) and Advanced Practice Providers (APPs -  Physician Assistants and Nurse Practitioners) who all work together to provide you with the care you need, when you need it.  We recommend signing up for the patient portal called "MyChart".  Sign up information is provided on this After Visit Summary.  MyChart is used to connect with patients for Virtual Visits (Telemedicine).  Patients are able to view lab/test results, encounter notes, upcoming appointments, etc.  Non-urgent messages can be sent to your provider as well.   To learn more about what you can do with MyChart, go to [email or web address].    Your next appointment:   4-5 month(s)  Provider:   Donato Schultz, MD     Other Instructions Afib clinic at patient earliest convenience.

## 2023-05-05 ENCOUNTER — Ambulatory Visit (HOSPITAL_COMMUNITY): Payer: Medicare Other | Admitting: Physician Assistant

## 2023-05-09 ENCOUNTER — Other Ambulatory Visit: Payer: Self-pay | Admitting: Cardiology

## 2023-05-12 ENCOUNTER — Ambulatory Visit (HOSPITAL_COMMUNITY): Payer: Medicare Other | Admitting: Physician Assistant

## 2023-05-19 ENCOUNTER — Ambulatory Visit (HOSPITAL_COMMUNITY)
Admission: RE | Admit: 2023-05-19 | Discharge: 2023-05-19 | Disposition: A | Discharge status: Home / Self Care | Payer: Medicare Other | Source: Ambulatory Visit | Attending: Physician Assistant | Admitting: Physician Assistant

## 2023-05-19 ENCOUNTER — Encounter (HOSPITAL_COMMUNITY): Payer: Self-pay | Admitting: Physician Assistant

## 2023-05-19 VITALS — BP 130/70 | HR 53 | Ht 62.0 in | Wt 195.6 lb

## 2023-05-19 DIAGNOSIS — I252 Old myocardial infarction: Secondary | ICD-10-CM | POA: Insufficient documentation

## 2023-05-19 DIAGNOSIS — I10 Essential (primary) hypertension: Secondary | ICD-10-CM | POA: Insufficient documentation

## 2023-05-19 DIAGNOSIS — Z79899 Other long term (current) drug therapy: Secondary | ICD-10-CM | POA: Insufficient documentation

## 2023-05-19 DIAGNOSIS — D6869 Other thrombophilia: Secondary | ICD-10-CM | POA: Diagnosis not present

## 2023-05-19 DIAGNOSIS — Z8673 Personal history of transient ischemic attack (TIA), and cerebral infarction without residual deficits: Secondary | ICD-10-CM | POA: Insufficient documentation

## 2023-05-19 DIAGNOSIS — Z7901 Long term (current) use of anticoagulants: Secondary | ICD-10-CM | POA: Insufficient documentation

## 2023-05-19 DIAGNOSIS — I2510 Atherosclerotic heart disease of native coronary artery without angina pectoris: Secondary | ICD-10-CM | POA: Diagnosis not present

## 2023-05-19 DIAGNOSIS — I480 Paroxysmal atrial fibrillation: Secondary | ICD-10-CM | POA: Diagnosis not present

## 2023-05-19 NOTE — Progress Notes (Signed)
Primary Care Physician: Elaine Chick, MD Referring Physician: Dr. Johney Jackson Primary Cardiologist: Dr Elaine Jackson is a 67 y.o. female with a h/o afib,CAD, HTN, previous CVA, s/p ablation, 09/2017.  She had  done well until she developed afib in the setting of steriod shot and taper for an injured foot. Unfortunately, this caused her BP to elevate and then onset of afib. She saw Dr. Johney Jackson  and placed on daily Cardizem.     F/u in afib clinic 05/23/20 She feels well. Just one episode of afib that resolved within 30 mins taking cardizem.  No issues with eliquis with a CHA2DS2VASc score of 6. Recent labs with PCP show normal BMET and CBC.   F/u in afib clinic, 05/02/21. Reports that afib remains quiet. She is in SR today.  Describes what sounds like intermittent PAC's.  Continues on eliquis without bleeding issues.   F/u in afib clinic for episodes that has happened over the last few weeks but more so over the last couple days.  She describes this as a feeling of lightheadedness/dizziness to the point she feels that she may fall. It usually lasts a few seconds. She had 6/7 episodes like this in one day. It makes her feel terrible. She is not aware of irregular heart beat at he time. She had a LInq at one time, expired in 2021. She wore a monitor last fall and showed pac's but no afib.  EKG shows SR today. We discussed  a  2 week Zio patch live and she is in agreement. No change in recent health.   F/u  in afib clinic, 11/06/21. She was in afib 2/2 prednisone use when she saw Dr. Johney Jackson last month. He increased Cardizem and ask for her to f/u here. Today, ekg shows SR. Recent monitor showed atrial and ventricular ectopy over all  still low burden. No afib.  This was reviewed by Dr. Johney Jackson as well. ECHO done and unchanged. She is describing a sensation over lower esophagus  when walking to get to her mailbox.She is not short of breath nor does she describe anterior chest tightness/discomfort. Just  a sensation she has  noted over the last couple of weeks. Sounds atypical for heart pain. She has had a lap band in the past. Use to walk 2 miles a day but has not exercised in some time. The earlier ectopy that pt was experiencing has resolved with increase of CCB.   Follow up in the AF clinic 05/19/23. Patient was found to be in afib at her visit on 04/28/23. Patient's only symptom of afib is a dryness in the back of her throat. Her afib episodes are brief, never lasting longer than 30 minutes. No bleeding issues on anticoagulation. She is in SR today and feeling well.   Today, she denies symptoms of palpitations, chest pain, shortness of breath, orthopnea, PND, lower extremity edema, dizziness, presyncope, syncope, or neurologic sequela. The patient is tolerating medications without difficulties and is otherwise without complaint today.   Past Medical History:  Diagnosis Date   Allergic rhinitis, cause unspecified    Allergy    Basal cell carcinoma 10/04/1996   right chest-TX-DR.Turner   BCC (basal cell carcinoma) 12/05/2000   upper mid back TX DR.Jones   BCC (basal cell carcinoma) 12/05/2000   Left upper back TX Dr. Yetta Barre   [facility] (basal cell carcinoma) 09/16/2001   left upper back TX Dr.Jones   BCC (basal cell carcinoma) 08/15/2003   left  upper mid back T Dr.Jones   BCC (basal cell carcinoma) 08/15/2003   right upper mid back Dr. Yetta Barre   [facility] (basal cell carcinoma) 08/15/2003   left medial breast TX Dr.jones   BCC (basal cell carcinoma) 10/12/2004   left anterior shin tx Dr.jones   BCC (basal cell carcinoma) 09/14/2007   right of sternum-Tx-CX52fu   BCC (basal cell carcinoma) 10/20/2012   left post sholder-tx CX35FU   BCC (basal cell carcinoma) 10/20/2012   left sholder tip-TX- CX35FU   BCC (basal cell carcinoma) 10/20/2012   left temple-TX CX35FU   BCC (basal cell carcinoma) 10/20/2012   left outer calf-TX cx1fu   BCC (basal cell carcinoma) 10/20/2012   left upper arm-tx cx88fu    BCC (basal cell carcinoma) 10/20/2012   right sholder tip-tx cx12fu   BCC (basal cell carcinoma) 11/10/2013   right upper back-cx84fu   BCC (basal cell carcinoma) 06/07/2014   left inferior post sholder-TXpbx   BCC (basal cell carcinoma) 06/13/2015   righ post sholder-TXpbx   BCC (basal cell carcinoma) 06/13/2015   left upper ankle   BCC (basal cell carcinoma) 03/18/2017   right outer back-txpbx   BCC (basal cell carcinoma) 03/18/2017   left trapezius-txpbx   BCC (basal cell carcinoma) 03/18/2017   left deltoid-txpbx   BCC (basal cell carcinoma) 06/05/2017   right rhomboid-txpbx   BCC (basal cell carcinoma) 04/28/2018   right tragus-txpbx   BCC (basal cell carcinoma) 06/09/2018   mid upper back txpbx   BCC (basal cell carcinoma) 03/09/2019   left deltoid inferior txpbx (exc 09/19/21)   BCC (basal cell carcinoma) 03/09/2019   left chest medial txpbx   CAD (coronary artery disease)    LHC 12/20/2014 showed small to moderate apical infarction due to distal LAD stenosis with reestablished TIMI2 flow. No PCI. Recommended plavix for 1 yr and ASA for life.      Chicken pox    Chronic bronchitis (HCC)    Family history of breast cancer in first degree relative    sister age 44   Heart murmur    Hyperlipidemia    Hypertension    Hypothyroidism    Malignant neoplasm skin of face 02/16/2007   Basal Cell Carcinoma  Tafeen.   Measles    Melanoma (HCC) 12/17/2017   left forearm-MIS TX- Excision   Migraine <1982   "went away when I got pregnant" (09/25/2017)   Myocardial infarction (HCC) 01/2015   Obesity, unspecified    Other chronic nonalcoholic liver disease    Pre-diabetes    SCCA (squamous cell carcinoma) of skin 01/02/2021   Left Lower Leg - Anterior (in situ) tx cx3 10fu   SCCA (squamous cell carcinoma) of skin 01/02/2021   Left Submandibular Area (in situ) tx cx 3 90fu   Skin cancer 10/12/2004   right anterior shin-CIS-TX Dr.Jones   Skin cancer 03/10/2013   right lower  shin-txpbx CIS   Skin cancer 01/18/2015   left neck-KA cx1 + Cautery   Skin cancer 02/28/2015   left neck-excision   Skin cancer 06/05/2017   right scapula-CIS   Skin cancer 04/28/2018   left ear helix-well diff scc-txpbx   Sleep related leg cramps    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; denies residual on 09/25/2017   Thyroid nodule    10-2016    Unspecified vitamin D deficiency    Vision abnormalities     Current Outpatient Medications  Medication Sig Dispense Refill   apixaban (  ELIQUIS) 5 MG TABS tablet Take 1 tablet (5 mg total) by mouth 2 (two) times daily. 180 tablet 1   atorvastatin (LIPITOR) 80 MG tablet Take 80 mg by mouth daily.     azelastine (ASTELIN) 0.1 % nasal spray Place 2 sprays into both nostrils as needed for rhinitis. Use in each nostril as directed     B COMPLEX-C-FOLIC ACID PO Take 1 tablet by mouth daily.     cholecalciferol (VITAMIN D) 400 units TABS tablet Take 2,000 Units by mouth daily.      CONTOUR NEXT TEST test strip AS DIRECTED 100 each 4   diltiazem (CARDIZEM CD) 240 MG 24 hr capsule Take 1 capsule (240 mg total) by mouth daily. 90 capsule 3   Fluorouracil (TOLAK) 4 % CREA Apply 1 Application topically at bedtime. 40 g 0   fluticasone (FLONASE) 50 MCG/ACT nasal spray Place 2 sprays into both nostrils daily as needed for allergies. 48 g 3   furosemide (LASIX) 20 MG tablet Take 20 mg by mouth as needed.     levothyroxine (SYNTHROID) 112 MCG tablet Take 112 mcg by mouth daily.     montelukast (SINGULAIR) 10 MG tablet Take 10 mg by mouth daily.     Multiple Vitamins-Minerals (MULTIVITAMIN ADULT EXTRA C PO) Multivitamin     omeprazole (PRILOSEC) 40 MG capsule Take 40 mg by mouth daily.     potassium chloride SA (K-DUR,KLOR-CON) 20 MEQ tablet Take 20 mEq by mouth as needed. Take with lasix.     Semaglutide,0.25 or 0.5MG /DOS, 2 MG/3ML SOPN Inject into the skin.     valsartan (DIOVAN) 80 MG tablet TAKE 1 TABLET BY MOUTH EVERY DAY 90 tablet 3    No current facility-administered medications for this encounter.    ROS- All systems are reviewed and negative except as per the HPI above  Physical Exam: Vitals:   05/19/23 0925  BP: 130/70  Pulse: (!) 53  Weight: 88.7 kg  Height: 5\' 2"  (1.575 m)    Wt Readings from Last 3 Encounters:  05/19/23 88.7 kg  04/28/23 88.2 kg  03/26/22 89.8 kg     GEN: Well nourished, well developed in no acute distress NECK: No JVD; No carotid bruits CARDIAC: Regular rate and rhythm, no murmurs, rubs, gallops RESPIRATORY:  Clear to auscultation without rales, wheezing or rhonchi  ABDOMEN: Soft, non-tender, non-distended EXTREMITIES:  No edema; No deformity    EKG today demonstrates SB Vent. rate 53 BPM PR interval 142 ms QRS duration 82 ms QT/QTcB 424/397 ms   Epic records reviewed  Echo 10/12/21  1. Left ventricular ejection fraction, by estimation, is 65 to 70%. Left  ventricular ejection fraction by 3D volume is 73 %. The left ventricle has  normal function. The left ventricle has no regional wall motion  abnormalities. Left ventricular diastolic parameters are consistent with Grade II diastolic dysfunction (pseudonormalization).   2. Right ventricular systolic function is normal. The right ventricular  size is mildly enlarged. There is mildly elevated pulmonary artery  systolic pressure.   3. Left atrial size was severely dilated.   4. The mitral valve is grossly normal. No evidence of mitral valve  regurgitation. The mean mitral valve gradient is 1.9 mmHg with average  heart rate of 67 bpm.   5. The aortic valve is tricuspid. Aortic valve regurgitation is mild. No  aortic stenosis is present.   6. The inferior vena cava is dilated in size with >50% respiratory  variability, suggesting right atrial pressure of  8 mmHg.   Comparison(s): No significant change from prior study. EF 60%.    CHA2DS2-VASc Score = 7  The patient's score is based upon: CHF History: 0 HTN  History: 1 Diabetes History: 1 Stroke History: 2 Vascular Disease History: 1 Age Score: 1 Gender Score: 1       ASSESSMENT AND PLAN: Paroxysmal Atrial Fibrillation (ICD10:  I48.0) The patient's CHA2DS2-VASc score is 7, indicating a 11.2% annual risk of stroke.   S/p afib ablation 09/2017  We discussed rhythm control options today including AAD (Multaq, dofetilide) or repeat ablation. Would avoid class IC with h/o CAD. She would like to pursue watchful waiting for now and only consider further rhythm control if her episodes become more frequent.  Continue diltiazem 240 mg daily with 30 mg q 4 hours PRN for heart racing Continue Eliquis 5 mg BID  Secondary Hypercoagulable State (ICD10:  D68.69) The patient is at significant risk for stroke/thromboembolism based upon her CHA2DS2-VASc Score of 7.  Continue Apixaban (Eliquis).   HTN Stable on current regimen  CAD NSTEMI 2016 No typical anginal c/o's On statin No anginal symptoms    Follow up in the AF clinic in one year.    Jorja Loa PA-C Afib Clinic [facility] [address] [place], [place] [address] [phone]

## 2023-06-30 ENCOUNTER — Encounter: Payer: Self-pay | Admitting: Cardiology

## 2023-07-24 ENCOUNTER — Encounter: Payer: Self-pay | Admitting: Cardiology

## 2023-07-24 DIAGNOSIS — I480 Paroxysmal atrial fibrillation: Secondary | ICD-10-CM

## 2023-07-24 MED ORDER — APIXABAN 5 MG PO TABS: 5.0000 mg | ORAL_TABLET | Freq: Two times a day (BID) | ORAL | 0 refills | Status: DC

## 2023-07-24 NOTE — Telephone Encounter (Signed)
Pt last saw Clint Fenton, PA on 05/19/23, last labs 04/23/23 Creat 1.1, age 68, weight 88.7kg, based on specified criteria pt is on appropriate dosage of Eliquis 5mg  BID for afib.  Will refill rx.

## 2023-08-21 ENCOUNTER — Other Ambulatory Visit: Payer: Self-pay | Admitting: Cardiology

## 2023-09-24 ENCOUNTER — Telehealth: Payer: Self-pay | Admitting: Cardiology

## 2023-09-24 DIAGNOSIS — I480 Paroxysmal atrial fibrillation: Secondary | ICD-10-CM

## 2023-09-24 NOTE — Telephone Encounter (Signed)
 Pt reports feeling abnormal daily.  Meaning she is "kinda whoozy, aura around her eyes sometimes", and "some of the symptoms I had when I was in afib".  States episodes are now occurring daily, "but do not last long maybe 30 minutes".   (Previous afib ablation 2019) Says that episodes are occurring more frequently.  Pt made aware she will most likely need to wear another monitor to determine exactly what she is feeling and is occurring.  (Last monitor showed no afib, but some SVT/PVCs). She is agreeable to this plan. Aware forwarding to MD and office will let her know once he advises. She appreciates the call and talking with her about this.

## 2023-09-24 NOTE — Telephone Encounter (Signed)
  Per MyChart scheduling message:  STAT if patient feels like he/she is going to faint   Are you dizzy, lightheaded, or faint now?   Have you passed out? IF YES MOVE TO .SYNCOPECVD  Do you have any other symptoms?   Have you checked your HR and BP (record if available)?    I'm having some dizzy spells and would like to see if I can get a heart monitor to check my afib  It's more lightheaded. Have not passed out or feel like im going to fall. Not where I can check my presssures. I spoke to the PA the last time I was seen in the office. It seems to be happening more frequently. I feel like if I could wear a monitor maybe it would catch it.

## 2023-09-25 ENCOUNTER — Ambulatory Visit: Attending: Cardiology

## 2023-09-25 DIAGNOSIS — I480 Paroxysmal atrial fibrillation: Secondary | ICD-10-CM

## 2023-09-25 NOTE — Progress Notes (Unsigned)
 Enrolled for [employer] to mail a ZIO XT long term holter monitor to the patients address on file.

## 2023-09-25 NOTE — Telephone Encounter (Signed)
 Patient is aware provider recommends 14 day ZIO monitor. Order for monitor placed.

## 2023-10-14 ENCOUNTER — Other Ambulatory Visit: Payer: Self-pay

## 2023-10-14 ENCOUNTER — Emergency Department (HOSPITAL_BASED_OUTPATIENT_CLINIC_OR_DEPARTMENT_OTHER)
Admission: EM | Admit: 2023-10-14 | Discharge: 2023-10-14 | Disposition: A | Discharge status: Home / Self Care | Attending: Emergency Medicine | Admitting: Emergency Medicine

## 2023-10-14 ENCOUNTER — Encounter (HOSPITAL_BASED_OUTPATIENT_CLINIC_OR_DEPARTMENT_OTHER): Payer: Self-pay | Admitting: Emergency Medicine

## 2023-10-14 DIAGNOSIS — T50905A Adverse effect of unspecified drugs, medicaments and biological substances, initial encounter: Secondary | ICD-10-CM | POA: Insufficient documentation

## 2023-10-14 DIAGNOSIS — Z794 Long term (current) use of insulin: Secondary | ICD-10-CM | POA: Insufficient documentation

## 2023-10-14 DIAGNOSIS — Z7901 Long term (current) use of anticoagulants: Secondary | ICD-10-CM | POA: Diagnosis not present

## 2023-10-14 DIAGNOSIS — R42 Dizziness and giddiness: Secondary | ICD-10-CM | POA: Diagnosis present

## 2023-10-14 DIAGNOSIS — T887XXA Unspecified adverse effect of drug or medicament, initial encounter: Secondary | ICD-10-CM | POA: Diagnosis not present

## 2023-10-14 LAB — URINALYSIS, ROUTINE W REFLEX MICROSCOPIC
Bacteria, UA: NONE SEEN
Bilirubin Urine: NEGATIVE
Glucose, UA: NEGATIVE mg/dL
Hgb urine dipstick: NEGATIVE
Ketones, ur: NEGATIVE mg/dL
Nitrite: NEGATIVE
Specific Gravity, Urine: 1.029 (ref 1.005–1.030)
pH: 5 (ref 5.0–8.0)

## 2023-10-14 LAB — CBC
HCT: 38.9 % (ref 36.0–46.0)
Hemoglobin: 13.1 g/dL (ref 12.0–15.0)
MCH: 32.5 pg (ref 26.0–34.0)
MCHC: 33.7 g/dL (ref 30.0–36.0)
MCV: 96.5 fL (ref 80.0–100.0)
Platelets: 281 10*3/uL (ref 150–400)
RBC: 4.03 MIL/uL (ref 3.87–5.11)
RDW: 12.7 % (ref 11.5–15.5)
WBC: 9.7 10*3/uL (ref 4.0–10.5)
nRBC: 0 % (ref 0.0–0.2)

## 2023-10-14 LAB — BASIC METABOLIC PANEL WITH GFR
Anion gap: 15 (ref 5–15)
BUN: 20 mg/dL (ref 8–23)
CO2: 22 mmol/L (ref 22–32)
Calcium: 9.6 mg/dL (ref 8.9–10.3)
Chloride: 99 mmol/L (ref 98–111)
Creatinine, Ser: 1.01 mg/dL — ABNORMAL HIGH (ref 0.44–1.00)
GFR, Estimated: >60 mL/min (ref >60)
Glucose, Bld: 117 mg/dL — ABNORMAL HIGH (ref 70–99)
Potassium: 4 mmol/L (ref 3.5–5.1)
Sodium: 136 mmol/L (ref 135–145)

## 2023-10-14 LAB — CBG MONITORING, ED: Glucose-Capillary: 107 mg/dL — ABNORMAL HIGH (ref 70–99)

## 2023-10-14 NOTE — ED Notes (Signed)
Pt denies dizziness at this time.

## 2023-10-14 NOTE — Discharge Instructions (Signed)
 As we discussed, call your dermatologist today so they can start another antibiotic. Do not take any more of the Septra, which is a sulfa based medications.   Return to the ED with any new or concerning symptoms.

## 2023-10-14 NOTE — ED Notes (Signed)

## 2023-10-14 NOTE — ED Provider Notes (Signed)
 [place] EMERGENCY DEPARTMENT AT [facility] Provider Note   CSN: [id number] Arrival date & time: 10/14/23  1239     History {Add pertinent medical, surgical, social history, OB history to HPI:1} Chief Complaint  Patient presents with   Dizziness    Elaine Jackson is a 68 y.o. female.   Dizziness      Home Medications Prior to Admission medications   Medication Sig Start Date End Date Taking? Authorizing Provider  apixaban  (ELIQUIS ) 5 MG TABS tablet Take 1 tablet (5 mg total) by mouth 2 (two) times daily. 07/24/23   Hugh Madura, MD  atorvastatin  (LIPITOR ) 80 MG tablet Take 80 mg by mouth daily.    [provider]  azelastine  (ASTELIN ) 0.1 % nasal spray Place 2 sprays into both nostrils as needed for rhinitis. Use in each nostril as directed    [provider]  B COMPLEX-C-FOLIC ACID PO Take 1 tablet by mouth daily.    [provider]  cholecalciferol  (VITAMIN D ) 400 units TABS tablet Take 2,000 Units by mouth daily.     [provider]  CONTOUR NEXT TEST test strip AS DIRECTED 01/09/18   Valere Gata, MD  diltiazem  (CARDIZEM  CD) 240 MG 24 hr capsule TAKE 1 CAPSULE BY MOUTH EVERY DAY 08/21/23   Hugh Madura, MD  Fluorouracil  (TOLAK ) 4 % CREA Apply 1 Application topically at bedtime. 01/02/22   Sheffield, Kelli R, PA-C  fluticasone  (FLONASE ) 50 MCG/ACT nasal spray Place 2 sprays into both nostrils daily as needed for allergies. 06/09/17   Smith, Kristi M, MD  furosemide  (LASIX ) 20 MG tablet Take 20 mg by mouth as needed.    [provider]  levothyroxine  (SYNTHROID ) 112 MCG tablet Take 112 mcg by mouth daily. 06/19/20   [provider]  montelukast  (SINGULAIR ) 10 MG tablet Take 10 mg by mouth daily.    [provider]  Multiple Vitamins-Minerals (MULTIVITAMIN ADULT EXTRA C PO) Multivitamin    [provider]  omeprazole (PRILOSEC) 40 MG capsule Take 40 mg by mouth daily. 11/14/21   [provider]  potassium chloride  SA (K-DUR,KLOR-CON ) 20 MEQ tablet Take 20 mEq by mouth as needed. Take with lasix .    [provider]  Semaglutide,0.25 or 0.5MG /DOS, 2 MG/3ML SOPN Inject into the skin. 04/23/23   [provider]  valsartan  (DIOVAN ) 80 MG tablet TAKE 1 TABLET BY MOUTH EVERY DAY 05/09/23   Hugh Madura, MD      Allergies    Sulfa antibiotics and Zofran  [ondansetron  hcl]    Review of Systems   Review of Systems  Neurological:  Positive for dizziness.    Physical Exam Updated Vital Signs BP (!) 129/102 (BP Location: Right Arm)   Pulse (!) 57   Temp 97.7 F (36.5 C)   Resp 16   SpO2 100%  Physical Exam  ED Results / Procedures / Treatments   Labs (all labs ordered are listed, but only abnormal results are displayed) Labs Reviewed  BASIC METABOLIC PANEL WITH GFR - Abnormal; Notable for the following components:      Result Value   Glucose, Bld 117 (*)    Creatinine, Ser 1.01 (*)    All other components within normal limits  URINALYSIS, ROUTINE W REFLEX MICROSCOPIC - Abnormal; Notable for the following components:   Protein, ur TRACE (*)    Leukocytes,Ua MODERATE (*)    All other components within normal limits  CBG MONITORING, ED - Abnormal; Notable for  the following components:   Glucose-Capillary 107 (*)    All other components within normal limits  CBC    EKG None  Radiology No results found.  Procedures Procedures  {Document cardiac monitor, telemetry assessment procedure when appropriate:1}  Medications Ordered in ED Medications - No data to display  ED Course/ Medical Decision Making/ A&P   {   Click here for ABCD2, HEART and other calculatorsREFRESH Note before signing :1}                              Medical Decision Making  ***  {Document critical care time when appropriate:1} {Document review of labs and clinical decision tools ie heart score, Chads2Vasc2 etc:1}  {Document your independent review of  radiology images, and any outside records:1} {Document your discussion with family members, caretakers, and with consultants:1} {Document social determinants of health affecting pt's care:1} {Document your decision making why or why not admission, treatments were needed:1} Final Clinical Impression(s) / ED Diagnoses Final diagnoses:  None    Rx / DC Orders ED Discharge Orders     None

## 2023-10-14 NOTE — ED Triage Notes (Signed)
 Started abt last night (she thinks she is having a reaction to it) Today having dizziness around 8:30 AM "I feel like I'm having an out of body experience"  Recently had heart monitor for "woozy" symptoms, reports this feeling different  Hx afib

## 2023-10-20 ENCOUNTER — Other Ambulatory Visit: Payer: Self-pay | Admitting: Cardiology

## 2023-10-20 DIAGNOSIS — I480 Paroxysmal atrial fibrillation: Secondary | ICD-10-CM

## 2023-10-20 NOTE — Telephone Encounter (Signed)
 Prescription refill request for Eliquis  received. Indication:afib Last office visit:11/24 Scr:1.01  4/25 Age: 68 Weight:88.7  kg  Prescription refilled

## 2023-10-25 ENCOUNTER — Encounter: Payer: Self-pay | Admitting: Cardiology

## 2023-10-27 ENCOUNTER — Encounter: Payer: Self-pay | Admitting: Cardiology

## 2023-10-27 DIAGNOSIS — I480 Paroxysmal atrial fibrillation: Secondary | ICD-10-CM

## 2024-01-26 ENCOUNTER — Other Ambulatory Visit: Payer: Self-pay

## 2024-01-26 ENCOUNTER — Encounter: Payer: Self-pay | Admitting: Cardiology

## 2024-01-26 DIAGNOSIS — I480 Paroxysmal atrial fibrillation: Secondary | ICD-10-CM

## 2024-01-26 MED ORDER — APIXABAN 5 MG PO TABS
5.0000 mg | ORAL_TABLET | Freq: Two times a day (BID) | ORAL | 1 refills | Status: DC
Start: 2024-01-26 — End: 2024-03-22

## 2024-02-19 ENCOUNTER — Other Ambulatory Visit: Payer: Self-pay | Admitting: Cardiology

## 2024-03-18 ENCOUNTER — Encounter: Payer: Self-pay | Admitting: Cardiology

## 2024-03-18 DIAGNOSIS — I480 Paroxysmal atrial fibrillation: Secondary | ICD-10-CM

## 2024-03-22 MED ORDER — APIXABAN 5 MG PO TABS: 5.0000 mg | ORAL_TABLET | Freq: Two times a day (BID) | ORAL | 0 refills | Status: AC

## 2024-05-16 ENCOUNTER — Other Ambulatory Visit: Payer: Self-pay | Admitting: Cardiology

## 2024-05-16 NOTE — Progress Notes (Signed)
  Subjective Patient ID: Elaine Jackson is a 68 y.o. female.  Chief Complaint  Patient presents with  . Cough    Thick mucus Was seen 2 weeks ago    The following information was reviewed by members of the visit team:  Tobacco  Allergies  Meds  OB Status      HPI  Review of Systems  Objective Physical Exam  Assessment/Plan Diagnoses and all orders for this visit:  Acute bronchitis, unspecified organism  Other orders -     amoxicillin -pot clavulanate (AUGMENTIN ) 875-125 mg per tablet; Take 1 tablet by mouth 2 (two) times a day for 10 days. -     albuterol HFA (PROVENTIL HFA;VENTOLIN HFA;PROAIR HFA) 90 mcg/actuation inhaler; Inhale 2 puffs every 6 (six) hours as needed for wheezing.   68 year old female with history of CVA, paroxysmal A-fib, benign essential hypertension, CAD presents with a cough.  She was seen here about 10 days ago for this cough.  She had a chest x-ray that showed no evidence of pneumonia.  She was given Decadron  and azithromycin .  She states she had some improvement of her symptoms but now her cough is returning.  She is having purulent sputum.  No chest pain or shortness of breath.  No extremity edema.  No dizziness or syncope.  DDX: Viral URI, bronchitis, COPD, GERD, pneumonia, sinusitis, lung abscess, asthma, pulm edema, aspiration pneumonia, foreign body inhalation, laryngeal inflammation, pleurisy, interstitial lung disease, medication side effect (ACE Inhibs), pulmonary edema, postnasal drip, bronchiectasis, psychogenic cough, laryngeal tumor, cystic fibrosis, medication abuse, substance use, neoplasm.    External records reviewed.  I looked at her echocardiogram from 2023.  Patient had a normal ejection fraction at that time.  She has severe dilatation of the left atria.  Diastolic dysfunction grade 2.  Patient is currently on Eliquis , atorvastatin , diltiazem , Lasix , Synthroid , Diovan .  Patient is eating drinking without difficulty.  Doubt foreign  body inhalation.  No evidence of extremity edema.  No chest pain or shortness of breath.  Doubt CHF.  No substance use.  No postnasal drip.  No history of cystic fibrosis or bronchiectasis.  She is expectorating purulent sputum.  She does not have symptoms of an acute viral syndrome.  She may have a persistent cough secondary to a virus.  Patient had crackles in her right middle lobe and a few rhonchi in her left lower lobe.  I do not have x-ray available to me here today but do have some concerns that she may have a pneumonia.  Will treat her with Augmentin .  Will have her follow-up closely with her PCP.  She is not hypoxic or tachypneic.  She does not appear to be in any acute distress.  Will also give her an albuterol inhaler  Urgent Care Disposition:  Follow up with PCP  Electronically signed: Lorane Esau Bob, PA-C 05/16/2024  12:41 PM

## 2024-05-18 ENCOUNTER — Ambulatory Visit (HOSPITAL_COMMUNITY)
Admission: RE | Admit: 2024-05-18 | Discharge: 2024-05-18 | Disposition: A | Discharge status: Home / Self Care | Source: Ambulatory Visit | Attending: Physician Assistant | Admitting: Physician Assistant

## 2024-05-18 VITALS — BP 120/70 | HR 93 | Ht 62.0 in | Wt 195.8 lb

## 2024-05-18 DIAGNOSIS — D6869 Other thrombophilia: Secondary | ICD-10-CM | POA: Diagnosis not present

## 2024-05-18 DIAGNOSIS — I4891 Unspecified atrial fibrillation: Secondary | ICD-10-CM | POA: Insufficient documentation

## 2024-05-18 DIAGNOSIS — I480 Paroxysmal atrial fibrillation: Secondary | ICD-10-CM | POA: Diagnosis not present

## 2024-05-18 NOTE — Progress Notes (Signed)
 Primary Care Physician: Elaine Rayfield HERO, MD Referring Physician: Dr. Kelsie Primary Cardiologist: Dr Elaine Jackson Elaine Jackson is a 68 y.o. female with a h/o afib,CAD, HTN, previous CVA, s/p ablation, 09/2017.  She had  done well until she developed afib in the setting of steriod shot and taper for an injured foot. Unfortunately, this caused her BP to elevate and then onset of afib. She saw Dr. Kelsie  and placed on daily Cardizem .     F/u in afib clinic 05/23/20 She feels well. Just one episode of afib that resolved within 30 mins taking cardizem .  No issues with eliquis  with a CHA2DS2VASc score of 6. Recent labs with PCP show normal BMET and CBC.   F/u in afib clinic, 05/02/21. Reports that afib remains quiet. She is in SR today.  Describes what sounds like intermittent PAC's.  Continues on eliquis  without bleeding issues.   F/u in afib clinic for episodes that has happened over the last few weeks but more so over the last couple days.  She describes this as a feeling of lightheadedness/dizziness to the point she feels that she may fall. It usually lasts a few seconds. She had 6/7 episodes like this in one day. It makes her feel terrible. She is not aware of irregular heart beat at he time. She had a LInq at one time, expired in 2021. She wore a monitor last fall and showed pac's but no afib.  EKG shows SR today. We discussed  a  2 week Zio patch live and she is in agreement. No change in recent health.   F/u  in afib clinic, 11/06/21. She was in afib 2/2 prednisone  use when she saw Dr. Kelsie last month. He increased Cardizem  and ask for her to f/u here. Today, ekg shows SR. Recent monitor showed atrial and ventricular ectopy over all  still low burden. No afib.  This was reviewed by Dr. Kelsie as well. ECHO done and unchanged. She is describing a sensation over lower esophagus  when walking to get to her mailbox.She is not short of breath nor does she describe anterior chest tightness/discomfort. Just  a sensation she has  noted over the last couple of weeks. Sounds atypical for heart pain. She has had a lap band in the past. Use to walk 2 miles a day but has not exercised in some time. The earlier ectopy that pt was experiencing has resolved with increase of CCB.   Follow up in the AF clinic 05/19/23. Patient was found to be in afib at her visit on 04/28/23. Patient's only symptom of afib is a dryness in the back of her throat. Her afib episodes are brief, never lasting longer than 30 minutes. No bleeding issues on anticoagulation. She is in SR today and feeling well.   Follow up 05/18/24. Patient returns for follow up for atrial fibrillation. She is in rate controlled afib today and is asymptomatic. She has been on antibiotics for an URI in the past two weeks. No bleeding issues on anticoagulation.   Today, she  denies symptoms of palpitations, chest pain, shortness of breath, orthopnea, PND, lower extremity edema, dizziness, presyncope, syncope, snoring, daytime somnolence, bleeding, or neurologic sequela. The patient is tolerating medications without difficulties and is otherwise without complaint today.    Past Medical History:  Diagnosis Date   Allergic rhinitis, cause unspecified    Allergy    Basal cell carcinoma 10/04/1996   right chest-TX-DR.Turner   BCC (basal cell carcinoma)  12/05/2000   upper mid back TX DR.Jones   BCC (basal cell carcinoma) 12/05/2000   Left upper back TX Dr. Joshua   [facility] (basal cell carcinoma) 09/16/2001   left upper back TX Dr.Jones   BCC (basal cell carcinoma) 08/15/2003   left upper mid back T Dr.Jones   BCC (basal cell carcinoma) 08/15/2003   right upper mid back Dr. Joshua   [facility] (basal cell carcinoma) 08/15/2003   left medial breast TX Dr.jones   BCC (basal cell carcinoma) 10/12/2004   left anterior shin tx Dr.jones   BCC (basal cell carcinoma) 09/14/2007   right of sternum-Tx-CX1fu   BCC (basal cell carcinoma) 10/20/2012   left post sholder-tx  CX35FU   BCC (basal cell carcinoma) 10/20/2012   left sholder tip-TX- CX35FU   BCC (basal cell carcinoma) 10/20/2012   left temple-TX CX35FU   BCC (basal cell carcinoma) 10/20/2012   left outer calf-TX cx8fu   BCC (basal cell carcinoma) 10/20/2012   left upper arm-tx cx16fu   BCC (basal cell carcinoma) 10/20/2012   right sholder tip-tx cx12fu   BCC (basal cell carcinoma) 11/10/2013   right upper back-cx73fu   BCC (basal cell carcinoma) 06/07/2014   left inferior post sholder-TXpbx   BCC (basal cell carcinoma) 06/13/2015   righ post sholder-TXpbx   BCC (basal cell carcinoma) 06/13/2015   left upper ankle   BCC (basal cell carcinoma) 03/18/2017   right outer back-txpbx   BCC (basal cell carcinoma) 03/18/2017   left trapezius-txpbx   BCC (basal cell carcinoma) 03/18/2017   left deltoid-txpbx   BCC (basal cell carcinoma) 06/05/2017   right rhomboid-txpbx   BCC (basal cell carcinoma) 04/28/2018   right tragus-txpbx   BCC (basal cell carcinoma) 06/09/2018   mid upper back txpbx   BCC (basal cell carcinoma) 03/09/2019   left deltoid inferior txpbx (exc 09/19/21)   BCC (basal cell carcinoma) 03/09/2019   left chest medial txpbx   CAD (coronary artery disease)    LHC 12/20/2014 showed small to moderate apical infarction due to distal LAD stenosis with reestablished TIMI2 flow. No PCI. Recommended plavix  for 1 yr and ASA for life.      Chicken pox    Chronic bronchitis (HCC)    Family history of breast cancer in first degree relative    sister age 22   Heart murmur    Hyperlipidemia    Hypertension    Hypothyroidism    Malignant neoplasm skin of face 02/16/2007   Basal Cell Carcinoma  Elaine Jackson.   Measles    Melanoma (HCC) 12/17/2017   left forearm-MIS TX- Excision   Migraine <1982   went away when I got pregnant (09/25/2017)   Myocardial infarction (HCC) 01/2015   Obesity, unspecified    Other chronic nonalcoholic liver disease    Pre-diabetes    SCCA (squamous cell  carcinoma) of skin 01/02/2021   Left Lower Leg - Anterior (in situ) tx cx3 5fu   SCCA (squamous cell carcinoma) of skin 01/02/2021   Left Submandibular Area (in situ) tx cx 3 59fu   Skin cancer 10/12/2004   right anterior shin-CIS-TX Dr.Jones   Skin cancer 03/10/2013   right lower shin-txpbx CIS   Skin cancer 01/18/2015   left neck-KA cx1 + Cautery   Skin cancer 02/28/2015   left neck-excision   Skin cancer 06/05/2017   right scapula-CIS   Skin cancer 04/28/2018   left ear helix-well diff scc-txpbx   Sleep related leg cramps    Stroke (cerebrum) (HCC)  due to large vessel disease; denies residual on 09/25/2017   Thyroid  nodule    10-2016    Unspecified vitamin D  deficiency    Vision abnormalities     Current Outpatient Medications  Medication Sig Dispense Refill   apixaban  (ELIQUIS ) 5 MG TABS tablet Take 1 tablet (5 mg total) by mouth 2 (two) times daily. 60 tablet 0   atorvastatin  (LIPITOR ) 80 MG tablet Take 80 mg by mouth daily.     azelastine  (ASTELIN ) 0.1 % nasal spray Place 2 sprays into both nostrils as needed for rhinitis. Use in each nostril as directed     B COMPLEX-C-FOLIC ACID PO Take 1 tablet by mouth daily.     cholecalciferol  (VITAMIN D ) 400 units TABS tablet Take 2,000 Units by mouth daily.      CONTOUR NEXT TEST test strip AS DIRECTED 100 each 4   diltiazem  (CARDIZEM  CD) 240 MG 24 hr capsule TAKE 1 CAPSULE BY MOUTH EVERY DAY 90 capsule 2   Fluorouracil  (TOLAK ) 4 % CREA Apply 1 Application topically at bedtime. 40 g 0   fluticasone  (FLONASE ) 50 MCG/ACT nasal spray Place 2 sprays into both nostrils daily as needed for allergies. 48 g 3   furosemide  (LASIX ) 20 MG tablet Take 20 mg by mouth as needed.     levothyroxine  (SYNTHROID ) 112 MCG tablet Take 112 mcg by mouth daily.     montelukast  (SINGULAIR ) 10 MG tablet Take 10 mg by mouth daily.     Multiple Vitamins-Minerals (MULTIVITAMIN ADULT EXTRA C PO) Multivitamin     omeprazole (PRILOSEC) 40 MG capsule Take 40 mg  by mouth daily.     potassium chloride  SA (K-DUR,KLOR-CON ) 20 MEQ tablet Take 20 mEq by mouth as needed. Take with lasix .     Semaglutide,0.25 or 0.5MG /DOS, 2 MG/3ML SOPN Inject into the skin.     valsartan  (DIOVAN ) 80 MG tablet TAKE 1 TABLET BY MOUTH EVERY DAY 90 tablet 0   No current facility-administered medications for this encounter.    ROS- All systems are reviewed and negative except as per the HPI above  Physical Exam: Vitals:   05/18/24 0819  Weight: 88.8 kg  Height: 5' 2 (1.575 m)    Wt Readings from Last 3 Encounters:  05/18/24 88.8 kg  05/19/23 88.7 kg  04/28/23 88.2 kg    GEN: Well nourished, well developed in no acute distress CARDIAC: Irregularly irregular rate and rhythm, no murmurs, rubs, gallops RESPIRATORY:  Clear to auscultation without rales, wheezing or rhonchi  ABDOMEN: Soft, non-tender, non-distended EXTREMITIES:  No edema; No deformity         Epic records reviewed  Echo 10/12/21  1. Left ventricular ejection fraction, by estimation, is 65 to 70%. Left  ventricular ejection fraction by 3D volume is 73 %. The left ventricle has  normal function. The left ventricle has no regional wall motion  abnormalities. Left ventricular diastolic parameters are consistent with Grade II diastolic dysfunction (pseudonormalization).   2. Right ventricular systolic function is normal. The right ventricular  size is mildly enlarged. There is mildly elevated pulmonary artery  systolic pressure.   3. Left atrial size was severely dilated.   4. The mitral valve is grossly normal. No evidence of mitral valve  regurgitation. The mean mitral valve gradient is 1.9 mmHg with average  heart rate of 67 bpm.   5. The aortic valve is tricuspid. Aortic valve regurgitation is mild. No  aortic stenosis is present.   6. The inferior vena cava is  dilated in size with >50% respiratory  variability, suggesting right atrial pressure of 8 mmHg.   Comparison(s): No significant  change from prior study. EF 60%.    CHA2DS2-VASc Score = 7  The patient's score is based upon: CHF History: 0 HTN History: 1 Diabetes History: 1 Stroke History: 2 Vascular Disease History: 1 Age Score: 1 Gender Score: 1       ASSESSMENT AND PLAN: Paroxysmal Atrial Fibrillation (ICD10:  I48.0) The patient's CHA2DS2-VASc score is 7, indicating a 11.2% annual risk of stroke.   S/p afib ablation 09/2017 Patient in rate controlled afib today, asymptomatic. Unclear burden of afib, she wore a monitor 6 months ago which showed no afib. We discussed rhythm control options including AAD (Multaq, dofetilide) or repeat ablation. She would like to regroup after the holiday season to revisit rhythm control. Continue present therapy for now.  Continue diltiazem  240 mg daily with 30 mg PRN q 4 hours for heart racing. Continue Eliquis  5 mg BID We discussed [employer] mobile for home monitoring.   Secondary Hypercoagulable State (ICD10:  D68.69) The patient is at significant risk for stroke/thromboembolism based upon her CHA2DS2-VASc Score of 7.  Continue Apixaban  (Eliquis ). No bleeding issues.   HTN Stable on current regimen  CAD No anginal symptoms Followed by Dr Elaine   Follow up in the AF clinic in 3 months.     Daril Kicks PA-C [facility] [address] [place], [place] [address] [phone]

## 2024-06-07 ENCOUNTER — Other Ambulatory Visit: Payer: Self-pay | Admitting: Family Medicine

## 2024-06-07 DIAGNOSIS — Z1231 Encounter for screening mammogram for malignant neoplasm of breast: Secondary | ICD-10-CM

## 2024-06-17 ENCOUNTER — Other Ambulatory Visit: Payer: Self-pay | Admitting: Cardiology

## 2024-06-21 ENCOUNTER — Other Ambulatory Visit: Payer: Self-pay

## 2024-06-23 MED ORDER — DILTIAZEM HCL ER COATED BEADS 240 MG PO CP24: 240.0000 mg | ORAL_CAPSULE | Freq: Every day | ORAL | 0 refills | Status: DC

## 2024-06-25 ENCOUNTER — Other Ambulatory Visit: Payer: Self-pay | Admitting: Medical Genetics

## 2024-07-02 ENCOUNTER — Ambulatory Visit
Admission: RE | Admit: 2024-07-02 | Discharge: 2024-07-02 | Disposition: A | Source: Ambulatory Visit | Attending: Family Medicine | Admitting: Family Medicine

## 2024-07-02 DIAGNOSIS — Z1231 Encounter for screening mammogram for malignant neoplasm of breast: Secondary | ICD-10-CM

## 2024-07-15 ENCOUNTER — Other Ambulatory Visit: Payer: Self-pay | Admitting: Cardiology

## 2024-07-18 ENCOUNTER — Other Ambulatory Visit: Payer: Self-pay | Admitting: Cardiology

## 2024-07-29 ENCOUNTER — Other Ambulatory Visit: Payer: Self-pay | Admitting: Medical Genetics

## 2024-07-29 DIAGNOSIS — Z006 Encounter for examination for normal comparison and control in clinical research program: Secondary | ICD-10-CM

## 2024-08-17 ENCOUNTER — Other Ambulatory Visit (HOSPITAL_COMMUNITY)

## 2024-08-18 ENCOUNTER — Ambulatory Visit (HOSPITAL_COMMUNITY): Admitting: Physician Assistant
